# Patient Record
Sex: Male | Born: 1940 | Race: White | Hispanic: No | Marital: Married | Smoking: Former smoker
Health system: Southern US, Community
[De-identification: ages and names within clinical notes are randomized; demographics above are authoritative.]

## PROBLEM LIST (undated history)

## (undated) ENCOUNTER — Emergency Department (HOSPITAL_COMMUNITY): Payer: Medicare Other | Source: Home / Self Care

## (undated) DIAGNOSIS — I209 Angina pectoris, unspecified: Secondary | ICD-10-CM

## (undated) DIAGNOSIS — R0600 Dyspnea, unspecified: Secondary | ICD-10-CM

## (undated) DIAGNOSIS — G9349 Other encephalopathy: Secondary | ICD-10-CM

## (undated) DIAGNOSIS — R011 Cardiac murmur, unspecified: Secondary | ICD-10-CM

## (undated) DIAGNOSIS — R569 Unspecified convulsions: Secondary | ICD-10-CM

## (undated) DIAGNOSIS — I639 Cerebral infarction, unspecified: Secondary | ICD-10-CM

## (undated) DIAGNOSIS — I2510 Atherosclerotic heart disease of native coronary artery without angina pectoris: Secondary | ICD-10-CM

## (undated) DIAGNOSIS — R55 Syncope and collapse: Secondary | ICD-10-CM

## (undated) DIAGNOSIS — I350 Nonrheumatic aortic (valve) stenosis: Secondary | ICD-10-CM

## (undated) HISTORY — DX: Cardiac murmur, unspecified: R01.1

## (undated) HISTORY — DX: Syncope and collapse: R55

## (undated) HISTORY — PX: NO PAST SURGERIES: SHX2092

## (undated) HISTORY — DX: Unspecified convulsions: R56.9

---

## 2016-06-19 DIAGNOSIS — Z23 Encounter for immunization: Secondary | ICD-10-CM | POA: Diagnosis not present

## 2017-03-13 ENCOUNTER — Emergency Department (HOSPITAL_COMMUNITY): Payer: Medicare Other

## 2017-03-13 ENCOUNTER — Encounter (HOSPITAL_COMMUNITY): Payer: Self-pay | Admitting: Emergency Medicine

## 2017-03-13 ENCOUNTER — Inpatient Hospital Stay (HOSPITAL_COMMUNITY)
Admission: EM | Admit: 2017-03-13 | Discharge: 2017-03-15 | DRG: 101 | Disposition: A | Discharge status: Home Health | Payer: Medicare Other | Attending: Internal Medicine | Admitting: Internal Medicine

## 2017-03-13 DIAGNOSIS — R410 Disorientation, unspecified: Secondary | ICD-10-CM

## 2017-03-13 DIAGNOSIS — R4189 Other symptoms and signs involving cognitive functions and awareness: Secondary | ICD-10-CM

## 2017-03-13 DIAGNOSIS — R7303 Prediabetes: Secondary | ICD-10-CM | POA: Diagnosis not present

## 2017-03-13 DIAGNOSIS — R55 Syncope and collapse: Secondary | ICD-10-CM | POA: Diagnosis present

## 2017-03-13 DIAGNOSIS — Z823 Family history of stroke: Secondary | ICD-10-CM | POA: Diagnosis not present

## 2017-03-13 DIAGNOSIS — Z23 Encounter for immunization: Secondary | ICD-10-CM

## 2017-03-13 DIAGNOSIS — S0990XA Unspecified injury of head, initial encounter: Secondary | ICD-10-CM | POA: Diagnosis not present

## 2017-03-13 DIAGNOSIS — R569 Unspecified convulsions: Secondary | ICD-10-CM | POA: Diagnosis not present

## 2017-03-13 DIAGNOSIS — W19XXXA Unspecified fall, initial encounter: Secondary | ICD-10-CM

## 2017-03-13 DIAGNOSIS — R4020 Unspecified coma: Secondary | ICD-10-CM | POA: Diagnosis not present

## 2017-03-13 DIAGNOSIS — S50312A Abrasion of left elbow, initial encounter: Secondary | ICD-10-CM | POA: Diagnosis not present

## 2017-03-13 DIAGNOSIS — S50311A Abrasion of right elbow, initial encounter: Secondary | ICD-10-CM | POA: Diagnosis not present

## 2017-03-13 DIAGNOSIS — R4182 Altered mental status, unspecified: Secondary | ICD-10-CM

## 2017-03-13 DIAGNOSIS — R402431 Glasgow coma scale score 3-8, in the field [EMT or ambulance]: Secondary | ICD-10-CM | POA: Diagnosis not present

## 2017-03-13 DIAGNOSIS — S199XXA Unspecified injury of neck, initial encounter: Secondary | ICD-10-CM | POA: Diagnosis not present

## 2017-03-13 DIAGNOSIS — F17210 Nicotine dependence, cigarettes, uncomplicated: Secondary | ICD-10-CM | POA: Diagnosis present

## 2017-03-13 LAB — DIFFERENTIAL
BASOS PCT: 0 %
Basophils Absolute: 0 10*3/uL (ref 0.0–0.1)
EOS ABS: 0.3 10*3/uL (ref 0.0–0.7)
EOS PCT: 2 %
Lymphocytes Relative: 10 %
Lymphs Abs: 1.3 10*3/uL (ref 0.7–4.0)
MONO ABS: 0.9 10*3/uL (ref 0.1–1.0)
Monocytes Relative: 7 %
NEUTROS PCT: 81 %
Neutro Abs: 9.5 10*3/uL — ABNORMAL HIGH (ref 1.7–7.7)

## 2017-03-13 LAB — I-STAT TROPONIN, ED: TROPONIN I, POC: 0.07 ng/mL (ref 0.00–0.08)

## 2017-03-13 LAB — I-STAT CHEM 8, ED
BUN: 22 mg/dL — ABNORMAL HIGH (ref 6–20)
CALCIUM ION: 1.09 mmol/L — AB (ref 1.15–1.40)
Chloride: 102 mmol/L (ref 101–111)
Creatinine, Ser: 1.2 mg/dL (ref 0.61–1.24)
GLUCOSE: 186 mg/dL — AB (ref 65–99)
HCT: 40 % (ref 39.0–52.0)
HEMOGLOBIN: 13.6 g/dL (ref 13.0–17.0)
Potassium: 4.3 mmol/L (ref 3.5–5.1)
SODIUM: 135 mmol/L (ref 135–145)
TCO2: 23 mmol/L (ref 0–100)

## 2017-03-13 LAB — CBG MONITORING, ED: GLUCOSE-CAPILLARY: 185 mg/dL — AB (ref 65–99)

## 2017-03-13 LAB — CBC
HCT: 39.5 % (ref 39.0–52.0)
Hemoglobin: 12.9 g/dL — ABNORMAL LOW (ref 13.0–17.0)
MCH: 26.8 pg (ref 26.0–34.0)
MCHC: 32.7 g/dL (ref 30.0–36.0)
MCV: 82.1 fL (ref 78.0–100.0)
PLATELETS: 216 10*3/uL (ref 150–400)
RBC: 4.81 MIL/uL (ref 4.22–5.81)
RDW: 13.8 % (ref 11.5–15.5)
WBC: 12 10*3/uL — ABNORMAL HIGH (ref 4.0–10.5)

## 2017-03-13 LAB — COMPREHENSIVE METABOLIC PANEL
ALT: 17 U/L (ref 17–63)
AST: 24 U/L (ref 15–41)
Albumin: 4 g/dL (ref 3.5–5.0)
Alkaline Phosphatase: 66 U/L (ref 38–126)
Anion gap: 11 (ref 5–15)
BILIRUBIN TOTAL: 0.6 mg/dL (ref 0.3–1.2)
BUN: 20 mg/dL (ref 6–20)
CO2: 20 mmol/L — ABNORMAL LOW (ref 22–32)
CREATININE: 1.39 mg/dL — AB (ref 0.61–1.24)
Calcium: 9.4 mg/dL (ref 8.9–10.3)
Chloride: 102 mmol/L (ref 101–111)
GFR calc Af Amer: 55 mL/min — ABNORMAL LOW (ref >60)
GFR, EST NON AFRICAN AMERICAN: 48 mL/min — AB (ref >60)
GLUCOSE: 186 mg/dL — AB (ref 65–99)
Potassium: 4.2 mmol/L (ref 3.5–5.1)
SODIUM: 133 mmol/L — AB (ref 135–145)
TOTAL PROTEIN: 6.7 g/dL (ref 6.5–8.1)

## 2017-03-13 LAB — APTT: aPTT: 39 seconds — ABNORMAL HIGH (ref 24–36)

## 2017-03-13 LAB — PROTIME-INR
INR: 0.99
PROTHROMBIN TIME: 13.1 s (ref 11.4–15.2)

## 2017-03-13 MED ORDER — ASPIRIN 325 MG PO TABS
ORAL_TABLET | ORAL | Status: AC
  Filled 2017-03-13: qty 1

## 2017-03-13 MED ORDER — SODIUM CHLORIDE 0.9 % IV SOLN
1000.0000 mg | INTRAVENOUS | Status: AC
  Administered 2017-03-13: 1000 mg via INTRAVENOUS
  Filled 2017-03-13: qty 10

## 2017-03-13 MED ORDER — ONDANSETRON HCL 4 MG/2ML IJ SOLN
4.0000 mg | Freq: Once | INTRAMUSCULAR | Status: DC
  Filled 2017-03-13 (×2): qty 2

## 2017-03-13 MED ORDER — ASPIRIN 81 MG PO CHEW
CHEWABLE_TABLET | ORAL | Status: AC
  Filled 2017-03-13: qty 4

## 2017-03-13 MED ORDER — ACETAMINOPHEN 325 MG PO TABS
650.0000 mg | ORAL_TABLET | ORAL | Status: DC | PRN
  Administered 2017-03-14 (×2): 650 mg via ORAL
  Filled 2017-03-13 (×2): qty 2

## 2017-03-13 MED ORDER — ACETAMINOPHEN 650 MG RE SUPP: 650.0000 mg | RECTAL | Status: DC | PRN

## 2017-03-13 MED ORDER — TETANUS-DIPHTH-ACELL PERTUSSIS 5-2.5-18.5 LF-MCG/0.5 IM SUSP
0.5000 mL | Freq: Once | INTRAMUSCULAR | Status: AC
  Administered 2017-03-13: 0.5 mL via INTRAMUSCULAR
  Filled 2017-03-13: qty 0.5

## 2017-03-13 MED ORDER — ACETAMINOPHEN 160 MG/5ML PO SOLN: 650.0000 mg | ORAL | Status: DC | PRN

## 2017-03-13 MED ORDER — STROKE: EARLY STAGES OF RECOVERY BOOK
Freq: Once | Status: DC
  Filled 2017-03-13 (×2): qty 1

## 2017-03-13 MED ORDER — ONDANSETRON HCL 4 MG/2ML IJ SOLN
4.0000 mg | Freq: Once | INTRAMUSCULAR | Status: AC
  Administered 2017-03-13: 4 mg via INTRAVENOUS
  Filled 2017-03-13: qty 2

## 2017-03-13 MED ORDER — SENNOSIDES-DOCUSATE SODIUM 8.6-50 MG PO TABS: 1.0000 | ORAL_TABLET | Freq: Every evening | ORAL | Status: DC | PRN

## 2017-03-13 MED ORDER — ENOXAPARIN SODIUM 40 MG/0.4ML ~~LOC~~ SOLN
40.0000 mg | SUBCUTANEOUS | Status: DC
  Administered 2017-03-14 (×2): 40 mg via SUBCUTANEOUS
  Filled 2017-03-13 (×2): qty 0.4

## 2017-03-13 MED ORDER — ASPIRIN 325 MG PO TABS
325.0000 mg | ORAL_TABLET | Freq: Once | ORAL | Status: AC
  Administered 2017-03-13: 325 mg via ORAL

## 2017-03-13 MED ORDER — SODIUM CHLORIDE 0.9 % IV SOLN
500.0000 mg | Freq: Two times a day (BID) | INTRAVENOUS | Status: DC
  Administered 2017-03-14 – 2017-03-15 (×3): 500 mg via INTRAVENOUS
  Filled 2017-03-13 (×4): qty 5

## 2017-03-13 NOTE — ED Notes (Signed)
Spoke with EDP order to take c-collar off.

## 2017-03-13 NOTE — Progress Notes (Signed)
Pt came from ED very confused and refusing to do MRI.  RN brought pt's son over and pt still does not want to do test and son agreed with me that pt needs anesthesia due to mental status at this time.Danny KitchenMarland Krause

## 2017-03-13 NOTE — ED Triage Notes (Signed)
Arrived via EMS from home EMS reported LKW time 1400 when wife saw patient go outside to cut trees.  Checked on patient in driveway abrasion on bilateral elbows. EMS reported unresponsive first assessment with right sided gaze.  C-collar applied. CBG 208 IV Left AC 18G. Upon arrival patient alert confused able to follow commands intermittently and continue to look to the right.  ED Doctor ordered to call a code stroke.

## 2017-03-13 NOTE — ED Notes (Signed)
Pt family and pt requesting pain medication for headache. EDP aware states that we are awaiting admitting provider Orders.

## 2017-03-13 NOTE — ED Notes (Signed)
Placed condom cath on pt to obtain urine, tolerated well. Pt and family made aware.

## 2017-03-13 NOTE — ED Provider Notes (Signed)
[facility] DEPT Provider Note   CSN: [id number] Arrival date & time: 03/13/17  1526     History   Chief Complaint Chief Complaint  Patient presents with  . Altered Mental Status  . Code Stroke    HPI Danny Krause is a 76 y.o. male.  Level V caveat: AMS  Danny Krause is a 76 y.o. Male with unknown medical history presents to the emergency department via EMS with altered mental status. Per EMS his last known normal time was 2 PM when his wife saw patient outside to cut some trees. Later, the wife went outside to check on her husband and found him on the driveway. Abrasions noted to his elbows.  EMS reports that on their arrival he was unresponsive with right-sided gaze. CBG is 208. Patient unable to cooperate with history.    The history is provided by the EMS personnel and medical records. No language interpreter was used.  Altered Mental Status      History reviewed. No pertinent past medical history.  Patient Active Problem List   Diagnosis Date Noted  . Seizure ([place]) 03/13/2017    History reviewed. No pertinent surgical history.     Home Medications    Prior to Admission medications   Not on File    Family History Family History  Problem Relation Age of Onset  . Hernia Father   . Stroke Brother     Social History Social History  Substance Use Topics  . Smoking status: Current Every Day Smoker    Packs/day: 2.00  . Smokeless tobacco: Never Used  . Alcohol use No     Allergies   Patient has no known allergies.   Review of Systems Review of Systems  Unable to perform ROS: Mental status change     Physical Exam Updated Vital Signs BP (!) 103/57 (BP Location: Right Arm)   Pulse 65   Temp 98.3 F (36.8 C) (Oral)   Resp 20   SpO2 97%   Physical Exam  Constitutional: He appears well-developed and well-nourished. No distress.  HENT:  Head: Normocephalic and atraumatic.  Right Ear: External ear normal.  Left Ear: External ear  normal.  Mouth/Throat: Oropharynx is clear and moist.  No visible or palpated signs of head injury or trauma.   Eyes: Pupils are equal, round, and reactive to light. Right eye exhibits no discharge. Left eye exhibits no discharge.  Dirt noted around eyes. With EOMs will not cross midline to look to right on initial exam.   Neck:  Wearing C-collar.   Cardiovascular: Regular rhythm, normal heart sounds and intact distal pulses.   HR 104  Pulmonary/Chest: Effort normal and breath sounds normal. No respiratory distress.  Abdominal: Soft. There is no tenderness. There is no guarding.  Musculoskeletal: Normal range of motion. He exhibits no tenderness or deformity.  Abrasions noted to his bilateral elbows. Patient's bilateral clavicles, shoulders, wrists, hands, hips and knees are supple and non-tender to palpation.   Neurological: He is alert.  Alert. Able to follow some commands. Will not cross midline gaze to right side. Poor effort with grip strength bilaterally. Will move toes bilaterally but will not move arms and legs to command. Repeats "help me" during exam and has no other vocal response. Will open eyes to command.   Skin: Skin is warm and dry. Capillary refill takes less than 2 seconds. No rash noted. He is not diaphoretic. No erythema. No pallor.  Abrasions noted to bilateral elbows.   Nursing note  and vitals reviewed.    ED Treatments / Results  Labs (all labs ordered are listed, but only abnormal results are displayed) Labs Reviewed  APTT - Abnormal; Notable for the following:       Result Value   aPTT 39 (*)    All other components within normal limits  CBC - Abnormal; Notable for the following:    WBC 12.0 (*)    Hemoglobin 12.9 (*)    All other components within normal limits  DIFFERENTIAL - Abnormal; Notable for the following:    Neutro Abs 9.5 (*)    All other components within normal limits  COMPREHENSIVE METABOLIC PANEL - Abnormal; Notable for the following:     Sodium 133 (*)    CO2 20 (*)    Glucose, Bld 186 (*)    Creatinine, Ser 1.39 (*)    GFR calc non Af Amer 48 (*)    GFR calc Af Amer 55 (*)    All other components within normal limits  ACETAMINOPHEN LEVEL - Abnormal; Notable for the following:    Acetaminophen (Tylenol), Serum <10 (*)    All other components within normal limits  LIPID PANEL - Abnormal; Notable for the following:    LDL Cholesterol 134 (*)    All other components within normal limits  I-STAT CHEM 8, ED - Abnormal; Notable for the following:    BUN 22 (*)    Glucose, Bld 186 (*)    Calcium, Ion 1.09 (*)    All other components within normal limits  CBG MONITORING, ED - Abnormal; Notable for the following:    Glucose-Capillary 185 (*)    All other components within normal limits  ETHANOL  PROTIME-INR  SALICYLATE LEVEL  RAPID URINE DRUG SCREEN, HOSP PERFORMED  URINALYSIS, ROUTINE W REFLEX MICROSCOPIC  HEMOGLOBIN A1C  TSH  VITAMIN B12  I-STAT TROPOININ, ED    EKG  EKG Interpretation  Date/Time:  Tuesday March 13 2017 15:50:33 EDT Ventricular Rate:  107 PR Interval:    QRS Duration: 118 QT Interval:  347 QTC Calculation: 463 R Axis:   155 Text Interpretation:  Sinus tachycardia IRBBB and LPFB No old tracing to compare Confirmed by Delora Fuel ([id number]) on 03/13/2017 4:00:21 PM       Radiology Ct Cervical Spine Wo Contrast  Result Date: 03/13/2017 CLINICAL DATA:  Jerelyn Scott on the ground. EXAM: CT HEAD WITHOUT CONTRAST CT CERVICAL SPINE WITHOUT CONTRAST TECHNIQUE: Multidetector CT imaging of the head and cervical spine was performed following the standard protocol without intravenous contrast. Multiplanar CT image reconstructions of the cervical spine were also generated. COMPARISON:  None. FINDINGS: CT HEAD FINDINGS Brain: No sign of acute infarction, mass lesion, hemorrhage, hydrocephalus or extra-axial collection. Few old small vessel changes of the left basal ganglia. Vascular: There is atherosclerotic  calcification of the major vessels at the base of the brain. Skull: Negative Sinuses/Orbits: Clear/ normal Other: None CT CERVICAL SPINE FINDINGS Alignment: Some straightening of the normal cervical lordosis. No traumatic malalignment. Skull base and vertebrae: No fracture or focal lesion. Soft tissues and spinal canal: Negative Disc levels: Degenerative spondylosis from C2-3 through C6-7. 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 Upper chest: Advanced emphysema. Other: None significant IMPRESSION: Head CT: No acute finding. Mild chronic small-vessel change of the left basal ganglia. Aspects 10. Cervical spine CT: No acute or traumatic finding. Chronic spondylosis. Emphysema. These results were discussed in person at the time  of interpretation on 03/13/2017 at 3:48 pm to Dr. Rory Percy , who verbally acknowledged these results. Electronically Signed   By: Nelson Chimes M.D.   On: 03/13/2017 15:55   Dg Chest Port 1 View  Result Date: 03/13/2017 CLINICAL DATA:  Altered mental status EXAM: PORTABLE CHEST 1 VIEW COMPARISON:  None. FINDINGS: Borderline cardiomegaly. Grossly negative mediastinal contours when accounting for rightward rotation. Nonspecific interstitial coarsening. No Kerley lines, effusion, or pneumothorax. IMPRESSION: 1. No definite acute disease. 2. Interstitial coarsening which could be bronchitic, atelectatic, or from pneumonitis. 3. Borderline cardiomegaly. Electronically Signed   By: Monte Fantasia M.D.   On: 03/13/2017 16:49   Ct Head Code Stroke W/o Cm  Result Date: 03/13/2017 CLINICAL DATA:  Jerelyn Scott on the ground. EXAM: CT HEAD WITHOUT CONTRAST CT CERVICAL SPINE WITHOUT CONTRAST TECHNIQUE: Multidetector CT imaging of the head and cervical spine was performed following the standard protocol without intravenous contrast. Multiplanar CT image reconstructions of the cervical spine were also generated. COMPARISON:  None. FINDINGS: CT HEAD  FINDINGS Brain: No sign of acute infarction, mass lesion, hemorrhage, hydrocephalus or extra-axial collection. Few old small vessel changes of the left basal ganglia. Vascular: There is atherosclerotic calcification of the major vessels at the base of the brain. Skull: Negative Sinuses/Orbits: Clear/ normal Other: None CT CERVICAL SPINE FINDINGS Alignment: Some straightening of the normal cervical lordosis. No traumatic malalignment. Skull base and vertebrae: No fracture or focal lesion. Soft tissues and spinal canal: Negative Disc levels: Degenerative spondylosis from C2-3 through C6-7. 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 Upper chest: Advanced emphysema. Other: None significant IMPRESSION: Head CT: No acute finding. Mild chronic small-vessel change of the left basal ganglia. Aspects 10. Cervical spine CT: No acute or traumatic finding. Chronic spondylosis. Emphysema. These results were discussed in person at the time of interpretation on 03/13/2017 at 3:48 pm to Dr. Rory Percy , who verbally acknowledged these results. Electronically Signed   By: Nelson Chimes M.D.   On: 03/13/2017 15:55    Procedures Procedures (including critical care time)  CRITICAL CARE Performed by: Hanley Hays   Total critical care time: 45 minutes  Critical care time was exclusive of separately billable procedures and treating other patients.  Critical care was necessary to treat or prevent imminent or life-threatening deterioration.  Critical care was time spent personally by me on the following activities: development of treatment plan with patient and/or surrogate as well as nursing, discussions with consultants, evaluation of patient's response to treatment, examination of patient, obtaining history from patient or surrogate, ordering and performing treatments and interventions, ordering and review of laboratory studies, ordering and review of  radiographic studies, pulse oximetry and re-evaluation of patient's condition.   Medications Ordered in ED Medications  levETIRAcetam (KEPPRA) 500 mg in sodium chloride 0.9 % 100 mL IVPB (not administered)  ondansetron (ZOFRAN) injection 4 mg (0 mg Intravenous Hold 03/13/17 2053)   stroke: mapping our early stages of recovery book (not administered)  acetaminophen (TYLENOL) tablet 650 mg (not administered)    Or  acetaminophen (TYLENOL) solution 650 mg (not administered)    Or  acetaminophen (TYLENOL) suppository 650 mg (not administered)  senna-docusate (Senokot-S) tablet 1 tablet (not administered)  enoxaparin (LOVENOX) injection 40 mg (40 mg Subcutaneous Given 03/14/17 0021)  Tdap (BOOSTRIX) injection 0.5 mL (0.5 mLs Intramuscular Given 03/13/17 1614)  levETIRAcetam (KEPPRA) 1,000 mg in sodium chloride 0.9 % 100 mL IVPB (0 mg Intravenous Stopped  03/13/17 1650)  ondansetron (ZOFRAN) injection 4 mg (4 mg Intravenous Given 03/13/17 1609)  aspirin tablet 325 mg (325 mg Oral Given 03/13/17 2124)     Initial Impression / Assessment and Plan / ED Course  I have reviewed the triage vital signs and the nursing notes.  Pertinent labs & imaging results that were available during my care of the patient were reviewed by me and considered in my medical decision making (see chart for details).    This is a 76 y.o. Male with unknown medical history presents to the emergency department via EMS with altered mental status. Per EMS his last known normal time was 2 PM when his wife saw patient outside to cut some trees. Later, the wife went outside to check on her husband and found him on the driveway. Abrasions noted to his elbows.  EMS reports that on their arrival he was unresponsive with right-sided gaze. CBG is 208. Patient unable to cooperate with history.  On patient arrival via EMS myself and Dr. Roxanne Mins are at bedside. He Is alert and able to follow some commands. He will open his eyes to verbal stimuli.  He was also his toes. On EOMs he does not cross the midline to look to the right. He repeats help me during exam, but says no other verbal response. Code stroke was activated.   Neurology down the patient's bedside. CT scan of head and C-spine shows no acute finding. Neurology suspects possible stroke versus seizure. Plan is to obtain MRI.  Blood work here is unremarkable. Chest x-ray shows emphysema and borderline cardiomegaly.  At reevaluation patient has become more responsive. He is awake and alert. He can tell me nothing about the events leading up to his ER visit. He tells me that he is in the hospital. He denies any pain or complaints on my reevaluation. Patient and family agree with plan for admission.  I consulted with tried hospitalist service who accepted the patient for admission.   This patient was discussed with and evaluated by Dr. Roxanne Mins who agrees with assessment and plan.   Final Clinical Impressions(s) / ED Diagnoses   Final diagnoses:  Altered mental status, unspecified altered mental status type  Unresponsive    New Prescriptions There are no discharge medications for this patient.    Waynetta Pean, PA-C 27/78/24 [id number]    Delora Fuel, MD 61/44/31 [phone]

## 2017-03-13 NOTE — ED Notes (Signed)
Son arrived at bedside speaking with Neurologist and stroke team.

## 2017-03-13 NOTE — ED Notes (Signed)
MRI called states patient will still not cooperate with having the MRI for 30-40 minutes with the sons help. States will probably need anesthesia.  Provider noted states to hold MRI at this time and will talk to the Hospitalist.

## 2017-03-13 NOTE — ED Notes (Signed)
Neurology at bedside speaking to 3 family members at bedside.

## 2017-03-13 NOTE — ED Notes (Signed)
EEG completed.

## 2017-03-13 NOTE — Consult Note (Signed)
Requesting Physician: Dr. Roxanne Mins    Chief Complaint: Confusion code stroke  History obtained from:  EMS and son  HPI:                                                                                                                                         Danny Krause is an 76 y.o. male who was out cutting trees and last seen normal at 1400 hours. HE was found down later with "gaze preference (right) per EMS.) on arrival he had multiple bruises, C-collar, confused but able to follow simple commands intermittently. Moving all extremities. No history of seizure or stroke. And takes no medications. At this point question of concussion versus seizure. CT head showed no acute pathology or bleed.   Date last known well: Date: 03/13/2017 Time last known well: Time: 14:00 tPA Given: No: no focal symptoms   History reviewed. No pertinent past medical history.  History reviewed. No pertinent surgical history.  No family history on file. Social History:  has no tobacco, alcohol, and drug history on file.  Allergies: No Known Allergies  Medications:                                                                                                                           None per son  ROS:                                                                                                                                       History obtained from unobtainable from patient due to confusion  General ROS: negative for - chills, fatigue, fever, night sweats, weight gain or weight loss Psychological ROS: negative for - behavioral disorder, hallucinations, memory difficulties, mood swings or suicidal ideation Ophthalmic ROS: negative for - blurry  vision, double vision, eye pain or loss of vision ENT ROS: negative for - epistaxis, nasal discharge, oral lesions, sore throat, tinnitus or vertigo Allergy and Immunology ROS: negative for - hives or itchy/watery eyes Hematological and Lymphatic ROS: negative  for - bleeding problems, bruising or swollen lymph nodes Endocrine ROS: negative for - galactorrhea, hair pattern changes, polydipsia/polyuria or temperature intolerance Respiratory ROS: negative for - cough, hemoptysis, shortness of breath or wheezing Cardiovascular ROS: negative for - chest pain, dyspnea on exertion, edema or irregular heartbeat Gastrointestinal ROS: negative for - abdominal pain, diarrhea, hematemesis, nausea/vomiting or stool incontinence Genito-Urinary ROS: negative for - dysuria, hematuria, incontinence or urinary frequency/urgency Musculoskeletal ROS: negative for - joint swelling or muscular weakness Neurological ROS: as noted in HPI Dermatological ROS: negative for rash and skin lesion changes  Neurologic Examination:                                                                                                      Blood pressure 122/71, pulse (!) 107, temperature (!) 96.5 F (35.8 C), temperature source Temporal, resp. rate (!) 22, SpO2 92 %.  HEENT-  Normocephalic, no lesions, without obvious abnormality.  Normal external eye and conjunctiva.  Normal TM's bilaterally.  Normal auditory canals and external ears. Normal external nose, mucus membranes and septum.  Normal pharynx. Cardiovascular- S1, S2 normal, pulses palpable throughout   Lungs- chest clear, no wheezing, rales, normal symmetric air entry Abdomen- soft, non-tender; bowel sounds normal; no masses,  no organomegaly Extremities- no joint deformities, effusion, or inflammation Lymph-no adenopathy palpable Musculoskeletal-no joint tenderness, deformity or swelling Skin-warm and dry, no hyperpigmentation, vitiligo, or suspicious lesions  Neurological Examination Mental Status: Alert, confused, follows some commands, no aphasia noted.  Cranial Nerves: II:  Visual fields grossly normal,  III,IV, VI: ptosis not present, extra-ocular motions intact bilaterally, pupils equal, round, reactive to light and  accommodation V,VII: smile symmetric, facial light touch sensation normal bilaterally VIII: hearing normal bilaterally IX,X: uvula rises symmetrically XI: bilateral shoulder shrug XII: midline tongue extension Motor: Right : Upper extremity   5/5    Left:     Upper extremity   5/5  Lower extremity   5/5     Lower extremity   5/5 Tone and bulk:normal tone throughout; no atrophy noted Sensory: Pinprick and light touch intact throughout, bilaterally--with decreased PP to LE bilaterally Deep Tendon Reflexes: 2+ and symmetric throughout UE and no KJ or AJ Plantars: Right: downgoing   Left: downgoing Cerebellar: Due to confusion unable to obtain Gait: not tested       Lab Results: Basic Metabolic Panel:  Recent Labs Lab 03/13/17 1534  NA 135  K 4.3  CL 102  GLUCOSE 186*  BUN 22*  CREATININE 1.20    Liver Function Tests: No results for input(s): AST, ALT, ALKPHOS, BILITOT, PROT, ALBUMIN in the last 168 hours. No results for input(s): LIPASE, AMYLASE in the last 168 hours. No results for input(s): AMMONIA in the last 168 hours.  CBC:  Recent Labs Lab 03/13/17 1528 03/13/17 1534  WBC 12.0*  --  NEUTROABS 9.5*  --   HGB 12.9* 13.6  HCT 39.5 40.0  MCV 82.1  --   PLT 216  --     Cardiac Enzymes: No results for input(s): CKTOTAL, CKMB, CKMBINDEX, TROPONINI in the last 168 hours.  Lipid Panel: No results for input(s): CHOL, TRIG, HDL, CHOLHDL, VLDL, LDLCALC in the last 168 hours.  CBG:  Recent Labs Lab 03/13/17 [place]    Microbiology: No results found for this or any previous visit.  Coagulation Studies:  Recent Labs  03/13/17 1528  LABPROT 13.1  INR 0.99    Imaging: Ct Cervical Spine Wo Contrast  Result Date: 03/13/2017 CLINICAL DATA:  Jerelyn Scott on the ground. EXAM: CT HEAD WITHOUT CONTRAST CT CERVICAL SPINE WITHOUT CONTRAST TECHNIQUE: Multidetector CT imaging of the head and cervical spine was performed following the standard protocol  without intravenous contrast. Multiplanar CT image reconstructions of the cervical spine were also generated. COMPARISON:  None. FINDINGS: CT HEAD FINDINGS Brain: No sign of acute infarction, mass lesion, hemorrhage, hydrocephalus or extra-axial collection. Few old small vessel changes of the left basal ganglia. Vascular: There is atherosclerotic calcification of the major vessels at the base of the brain. Skull: Negative Sinuses/Orbits: Clear/ normal Other: None CT CERVICAL SPINE FINDINGS Alignment: Some straightening of the normal cervical lordosis. No traumatic malalignment. Skull base and vertebrae: No fracture or focal lesion. Soft tissues and spinal canal: Negative Disc levels: Degenerative spondylosis from C2-3 through C6-7. 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 Upper chest: Advanced emphysema. Other: None significant IMPRESSION: Head CT: No acute finding. Mild chronic small-vessel change of the left basal ganglia. Aspects 10. Cervical spine CT: No acute or traumatic finding. Chronic spondylosis. Emphysema. These results were discussed in person at the time of interpretation on 03/13/2017 at 3:48 pm to Dr. Rory Percy , who verbally acknowledged these results. Electronically Signed   By: Nelson Chimes M.D.   On: 03/13/2017 15:55   Ct Head Code Stroke W/o Cm  Result Date: 03/13/2017 CLINICAL DATA:  Jerelyn Scott on the ground. EXAM: CT HEAD WITHOUT CONTRAST CT CERVICAL SPINE WITHOUT CONTRAST TECHNIQUE: Multidetector CT imaging of the head and cervical spine was performed following the standard protocol without intravenous contrast. Multiplanar CT image reconstructions of the cervical spine were also generated. COMPARISON:  None. FINDINGS: CT HEAD FINDINGS Brain: No sign of acute infarction, mass lesion, hemorrhage, hydrocephalus or extra-axial collection. Few old small vessel changes of the left basal ganglia. Vascular: There is atherosclerotic calcification  of the major vessels at the base of the brain. Skull: Negative Sinuses/Orbits: Clear/ normal Other: None CT CERVICAL SPINE FINDINGS Alignment: Some straightening of the normal cervical lordosis. No traumatic malalignment. Skull base and vertebrae: No fracture or focal lesion. Soft tissues and spinal canal: Negative Disc levels: Degenerative spondylosis from C2-3 through C6-7. 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 Upper chest: Advanced emphysema. Other: None significant IMPRESSION: Head CT: No acute finding. Mild chronic small-vessel change of the left basal ganglia. Aspects 10. Cervical spine CT: No acute or traumatic finding. Chronic spondylosis. Emphysema. These results were discussed in person at the time of interpretation on 03/13/2017 at 3:48 pm to Dr. Rory Percy , who verbally acknowledged these results. Electronically Signed   By: Nelson Chimes M.D.   On: 03/13/2017 15:55     Assessment and plan discussed with with attending physician and  they are in agreement.    Etta Quill PA-C [facility] [phone]  03/13/2017, 3:57 PM   Assessment: 76 y.o. male with AMS LSN 1400. At this time question seizure with post-ictal right sided gaze. At this point cannot rule out stroke but not TPA candidate.   Stroke Risk Factors - smoking  [facility] addendum Patient seen and examined with Etta Quill, PA-C in the emergency room as code stroke. Please see detailed history, review of systems and examination as above.  Briefly, 76 year old man with no past medical history was not seen a doctor in at least the last 15-20 years presented to the emergency room for evaluation after having been found down and also had a right gaze preference according to EMS and emergency room providers. Patient is a heavy smoker but denies any illicit drug use or alcohol use.  On examination in the emergency room: The patient was in a c-collar He was  awake, alert, followed some commands intermittently, mimicked a few times. Speech was very dysarthric. He was also not able to name any objects presented to him. Pupils are equal round and reactive to light 3 mm brisk, extraocular movements exam initially showed a right gaze preference but upon shining a bright light on the left side he was able completely bury his left pupil and to the left canthus, no facial asymmetry, no difference in facial sensation on either side, auditory acuity grossly intact. Motor exam: Grossly 5/5 in all 4 extremities. Normal tone and bulk throughout with no atrophy Sensory exam: Decreased sensation to light touch in both lower extremities, symmetric distribution. DTRs: 2+ all over the upper extremity, unable to elicit knee jerk or ankle jerk bilaterally Unable to test gait or coordination due to patient cooperation.  Pertinent labs Glucose 186, BUN 22, creatinine 1.2. Hemoglobin 13.6 hematocrit 40.0  Imaging Noncontrast CT of the head does not show any evidence of acute bleed or evolving infarct  Assessment 76 year old man brought to the ER after being found down, with concerns about her mental status and stroke. According to report he had some right gaze preference but on examination by the neurology team, he had no restriction of extraocular movements and no gaze preference. His examination is consistent with encephalopathy, which could be toxic/metabolic versus postictal due to a seizure since he was found down and had a fall.  Impression Toxic metabolic encephalopathy - most likely Rule out seizures - likely Rule out stroke - less likely  Recommendations  At this time we would recommend getting an MRI of the brain without contrast to evaluate for any evidence of acute stroke.  If there is an acute stroke, MRA or CT of the head and neck should also be obtained as well as stroke risk factor workup be pursued at that time as well.  Stat EEG was  obtained-read pending at this time. Dr. Leonel Ramsay will follow.  Keppra 500 mg twice a day-IV until clear his swallow evaluation, could convert to by mouth after that.  Labs to include B12, TSH, urinalysis, chest x-ray, urine drug screen  Stroke risk factor modification to include smoking cessation  -- Amie Portland, MD [facility] [phone]  If 7pm to 7am, please call on call as listed on AMION.

## 2017-03-13 NOTE — ED Notes (Signed)
Intermittently patient looks scared and moves body suddenly able to calm with talking to family.

## 2017-03-13 NOTE — ED Notes (Signed)
Clothes, phone, watch given to son at bedside.

## 2017-03-13 NOTE — ED Notes (Signed)
EEG at bedside.

## 2017-03-13 NOTE — Code Documentation (Signed)
76yo male arriving to [facility] via [place] at 75.  Patient from home where he was found outside by his wife.  LKW reported as 1400.  Code stroke activated on patient arrival d/t right gaze.  Patient to CT.  Stroke team to CT.  CT completed.  NIHSS 14, see documentation for details and code stroke times.  Patient initially not following commands and repeating "Help me Reita Cliche" to questions, however, moving all extremities.  Dr. Rory Percy at the bedside.  No focal deficits appreciated.  No acute stroke treatment at this time.  Code stroke canceled.  Bedside handoff with ED RN Marya Amsler.

## 2017-03-13 NOTE — Progress Notes (Signed)
STAT EEG completed; results pending.

## 2017-03-13 NOTE — ED Notes (Signed)
Son talking with patient stated name correctly.

## 2017-03-13 NOTE — ED Notes (Signed)
Son talking with patient. Patient feels like he is going to vomit. Doctor notified.

## 2017-03-13 NOTE — H&P (Signed)
History and Physical    Danny Krause IEP:[id number] DOB: Mar 20, 1941 DOA: 03/13/2017  PCP: No primary care provider on file.  Patient coming from: Home  I have personally briefly reviewed patient's old medical records in [place]  Chief Complaint: AMS  HPI: Danny Krause is a 76 y.o. male with no significant medical history, hasnt been to doctor in 35 years.  Patient brought in to ED by EMS after his wife found him down after he was LKW at 2pm cutting trees.  Abrasions to elbows, EMS reports unresponsive initially with R sided gaze.   ED Course: Has slowly improved in ED, CT head neg, MRI brain wasn't able to be performed initially due to patient not being able to hold still.  Patient has slowly recovered.  EEG done in ED results pending.  Now more alert, oriented, and just passed swallow screen.  Keppra 1gm load and 500 BID ordered per neurology.   Review of Systems: As per HPI otherwise 10 point review of systems negative.   History reviewed. No pertinent past medical history.  History reviewed. No pertinent surgical history.   reports that he has been smoking.  He does not have any smokeless tobacco history on file. He reports that he does not drink alcohol or use drugs.  No Known Allergies  Family History  Problem Relation Age of Onset  . Hernia Father   . Stroke Brother      Prior to Admission medications   Not on File    Physical Exam: Vitals:   03/13/17 1945 03/13/17 2000 03/13/17 2015 03/13/17 2045  BP: 117/73 108/68 109/65 117/71  Pulse: 64 61 (!) 59 67  Resp: (!) 23 (!) 22 (!) 25   Temp:      TempSrc:      SpO2: 97% 96% 95% 98%    Constitutional: NAD, calm, comfortable Eyes: PERRL, lids and conjunctivae normal ENMT: Mucous membranes are moist. Posterior pharynx clear of any exudate or lesions.Normal dentition.  Neck: normal, supple, no masses, no thyromegaly Respiratory: clear to auscultation bilaterally, no wheezing, no crackles. Normal  respiratory effort. No accessory muscle use.  Cardiovascular: Regular rate and rhythm, no murmurs / rubs / gallops. No extremity edema. 2+ pedal pulses. No carotid bruits.  Abdomen: no tenderness, no masses palpated. No hepatosplenomegaly. Bowel sounds positive.  Musculoskeletal: no clubbing / cyanosis. No joint deformity upper and lower extremities. Good ROM, no contractures. Normal muscle tone.  Skin: no rashes, lesions, ulcers. No induration Neurologic: CN 2-12 grossly intact. Sensation intact, DTR normal. Strength 5/5 in all 4.  Psychiatric: Normal judgment and insight. Alert and oriented x 3. Normal mood.    Labs on Admission: I have personally reviewed following labs and imaging studies  CBC:  Recent Labs Lab 03/13/17 1528 03/13/17 1534  WBC 12.0*  --   NEUTROABS 9.5*  --   HGB 12.9* 13.6  HCT 39.5 40.0  MCV 82.1  --   PLT 216  --    Basic Metabolic Panel:  Recent Labs Lab 03/13/17 1528 03/13/17 1534  NA 133* 135  K 4.2 4.3  CL 102 102  CO2 20*  --   GLUCOSE 186* 186*  BUN 20 22*  CREATININE 1.39* 1.20  CALCIUM 9.4  --    GFR: CrCl cannot be calculated (Unknown ideal weight.). Liver Function Tests:  Recent Labs Lab 03/13/17 1528  AST 24  ALT 17  ALKPHOS 66  BILITOT 0.6  PROT 6.7  ALBUMIN 4.0   No results  for input(s): LIPASE, AMYLASE in the last 168 hours. No results for input(s): AMMONIA in the last 168 hours. Coagulation Profile:  Recent Labs Lab 03/13/17 1528  INR 0.99   Cardiac Enzymes: No results for input(s): CKTOTAL, CKMB, CKMBINDEX, TROPONINI in the last 168 hours. BNP (last 3 results) No results for input(s): PROBNP in the last 8760 hours. HbA1C: No results for input(s): HGBA1C in the last 72 hours. CBG:  Recent Labs Lab 03/13/17 1528  GLUCAP 185*   Lipid Profile: No results for input(s): CHOL, HDL, LDLCALC, TRIG, CHOLHDL, LDLDIRECT in the last 72 hours. Thyroid Function Tests: No results for input(s): TSH, T4TOTAL, FREET4,  T3FREE, THYROIDAB in the last 72 hours. Anemia Panel: No results for input(s): VITAMINB12, FOLATE, FERRITIN, TIBC, IRON, RETICCTPCT in the last 72 hours. Urine analysis: No results found for: COLORURINE, APPEARANCEUR, [place], [place], GLUCOSEU, [place], BILIRUBINUR, KETONESUR, PROTEINUR, UROBILINOGEN, NITRITE, LEUKOCYTESUR  Radiological Exams on Admission: Ct Cervical Spine Wo Contrast  Result Date: 03/13/2017 CLINICAL DATA:  Jerelyn Scott on the ground. EXAM: CT HEAD WITHOUT CONTRAST CT CERVICAL SPINE WITHOUT CONTRAST TECHNIQUE: Multidetector CT imaging of the head and cervical spine was performed following the standard protocol without intravenous contrast. Multiplanar CT image reconstructions of the cervical spine were also generated. COMPARISON:  None. FINDINGS: CT HEAD FINDINGS Brain: No sign of acute infarction, mass lesion, hemorrhage, hydrocephalus or extra-axial collection. Few old small vessel changes of the left basal ganglia. Vascular: There is atherosclerotic calcification of the major vessels at the base of the brain. Skull: Negative Sinuses/Orbits: Clear/ normal Other: None CT CERVICAL SPINE FINDINGS Alignment: Some straightening of the normal cervical lordosis. No traumatic malalignment. Skull base and vertebrae: No fracture or focal lesion. Soft tissues and spinal canal: Negative Disc levels: Degenerative spondylosis from C2-3 through C6-7. 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 Upper chest: Advanced emphysema. Other: None significant IMPRESSION: Head CT: No acute finding. Mild chronic small-vessel change of the left basal ganglia. Aspects 10. Cervical spine CT: No acute or traumatic finding. Chronic spondylosis. Emphysema. These results were discussed in person at the time of interpretation on 03/13/2017 at 3:48 pm to Dr. Rory Percy , who verbally acknowledged these results. Electronically Signed   By: Nelson Chimes M.D.   On: 03/13/2017 15:55    Dg Chest Port 1 View  Result Date: 03/13/2017 CLINICAL DATA:  Altered mental status EXAM: PORTABLE CHEST 1 VIEW COMPARISON:  None. FINDINGS: Borderline cardiomegaly. Grossly negative mediastinal contours when accounting for rightward rotation. Nonspecific interstitial coarsening. No Kerley lines, effusion, or pneumothorax. IMPRESSION: 1. No definite acute disease. 2. Interstitial coarsening which could be bronchitic, atelectatic, or from pneumonitis. 3. Borderline cardiomegaly. Electronically Signed   By: Monte Fantasia M.D.   On: 03/13/2017 16:49   Ct Head Code Stroke W/o Cm  Result Date: 03/13/2017 CLINICAL DATA:  Jerelyn Scott on the ground. EXAM: CT HEAD WITHOUT CONTRAST CT CERVICAL SPINE WITHOUT CONTRAST TECHNIQUE: Multidetector CT imaging of the head and cervical spine was performed following the standard protocol without intravenous contrast. Multiplanar CT image reconstructions of the cervical spine were also generated. COMPARISON:  None. FINDINGS: CT HEAD FINDINGS Brain: No sign of acute infarction, mass lesion, hemorrhage, hydrocephalus or extra-axial collection. Few old small vessel changes of the left basal ganglia. Vascular: There is atherosclerotic calcification of the major vessels at the base of the brain. Skull: Negative Sinuses/Orbits: Clear/ normal Other: None CT CERVICAL SPINE FINDINGS Alignment: Some straightening of the normal  cervical lordosis. No traumatic malalignment. Skull base and vertebrae: No fracture or focal lesion. Soft tissues and spinal canal: Negative Disc levels: Degenerative spondylosis from C2-3 through C6-7. 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 Upper chest: Advanced emphysema. Other: None significant IMPRESSION: Head CT: No acute finding. Mild chronic small-vessel change of the left basal ganglia. Aspects 10. Cervical spine CT: No acute or traumatic finding. Chronic spondylosis. Emphysema. These results were  discussed in person at the time of interpretation on 03/13/2017 at 3:48 pm to Dr. Rory Percy , who verbally acknowledged these results. Electronically Signed   By: Nelson Chimes M.D.   On: 03/13/2017 15:55    EKG: Independently reviewed.  Assessment/Plan Active Problems:   Seizure ([employer])    1. Seizure vs TME vs stroke - 1. Mental status improved 2. Got keppra 1gm load and 500 BID in ED 3. EEG done in ED 4. MRI brain ordered 5. Getting ASA 325 for possible stroke although this less likely per neurology (also for his headache). 6. Tylenol PRN  DVT prophylaxis: Lovenox Code Status: Full Family Communication: Family at bedside Disposition Plan: Home after admit Consults called: Neurology Admission status: Place in [place], [place] Hospitalists Pager [phone]  If 7AM-7PM, please contact day team taking care of patient [email or web address] Password [email or web address]  03/13/2017, 9:13 PM

## 2017-03-13 NOTE — ED Notes (Signed)
MRI called stated patient not following directions. Stated will bring son to MRI because patient listens to son.

## 2017-03-14 ENCOUNTER — Encounter (HOSPITAL_COMMUNITY): Payer: Self-pay | Admitting: *Deleted

## 2017-03-14 ENCOUNTER — Observation Stay (HOSPITAL_COMMUNITY): Payer: Medicare Other

## 2017-03-14 DIAGNOSIS — S50312A Abrasion of left elbow, initial encounter: Secondary | ICD-10-CM | POA: Diagnosis present

## 2017-03-14 DIAGNOSIS — M25521 Pain in right elbow: Secondary | ICD-10-CM | POA: Diagnosis not present

## 2017-03-14 DIAGNOSIS — R400 Somnolence: Secondary | ICD-10-CM | POA: Diagnosis not present

## 2017-03-14 DIAGNOSIS — S59901A Unspecified injury of right elbow, initial encounter: Secondary | ICD-10-CM | POA: Diagnosis not present

## 2017-03-14 DIAGNOSIS — S0990XA Unspecified injury of head, initial encounter: Secondary | ICD-10-CM | POA: Diagnosis not present

## 2017-03-14 DIAGNOSIS — M25522 Pain in left elbow: Secondary | ICD-10-CM | POA: Diagnosis not present

## 2017-03-14 DIAGNOSIS — R410 Disorientation, unspecified: Secondary | ICD-10-CM | POA: Diagnosis not present

## 2017-03-14 DIAGNOSIS — R55 Syncope and collapse: Secondary | ICD-10-CM | POA: Diagnosis present

## 2017-03-14 DIAGNOSIS — R4020 Unspecified coma: Secondary | ICD-10-CM | POA: Diagnosis not present

## 2017-03-14 DIAGNOSIS — Z23 Encounter for immunization: Secondary | ICD-10-CM | POA: Diagnosis not present

## 2017-03-14 DIAGNOSIS — Z823 Family history of stroke: Secondary | ICD-10-CM | POA: Diagnosis not present

## 2017-03-14 DIAGNOSIS — R569 Unspecified convulsions: Secondary | ICD-10-CM | POA: Diagnosis present

## 2017-03-14 DIAGNOSIS — G9340 Encephalopathy, unspecified: Secondary | ICD-10-CM | POA: Diagnosis not present

## 2017-03-14 DIAGNOSIS — S3992XA Unspecified injury of lower back, initial encounter: Secondary | ICD-10-CM | POA: Diagnosis not present

## 2017-03-14 DIAGNOSIS — R4182 Altered mental status, unspecified: Secondary | ICD-10-CM | POA: Diagnosis not present

## 2017-03-14 DIAGNOSIS — S50311A Abrasion of right elbow, initial encounter: Secondary | ICD-10-CM | POA: Diagnosis present

## 2017-03-14 DIAGNOSIS — S59902A Unspecified injury of left elbow, initial encounter: Secondary | ICD-10-CM | POA: Diagnosis not present

## 2017-03-14 DIAGNOSIS — F17210 Nicotine dependence, cigarettes, uncomplicated: Secondary | ICD-10-CM | POA: Diagnosis present

## 2017-03-14 DIAGNOSIS — R4189 Other symptoms and signs involving cognitive functions and awareness: Secondary | ICD-10-CM | POA: Diagnosis not present

## 2017-03-14 DIAGNOSIS — W19XXXA Unspecified fall, initial encounter: Secondary | ICD-10-CM | POA: Diagnosis not present

## 2017-03-14 DIAGNOSIS — R7303 Prediabetes: Secondary | ICD-10-CM | POA: Diagnosis present

## 2017-03-14 DIAGNOSIS — M545 Low back pain: Secondary | ICD-10-CM | POA: Diagnosis not present

## 2017-03-14 LAB — RAPID URINE DRUG SCREEN, HOSP PERFORMED
Amphetamines: NOT DETECTED
Barbiturates: NOT DETECTED
Benzodiazepines: NOT DETECTED
Cocaine: NOT DETECTED
OPIATES: NOT DETECTED
Tetrahydrocannabinol: NOT DETECTED

## 2017-03-14 LAB — URINALYSIS, ROUTINE W REFLEX MICROSCOPIC
Bilirubin Urine: NEGATIVE
Glucose, UA: NEGATIVE mg/dL
Hgb urine dipstick: NEGATIVE
Ketones, ur: NEGATIVE mg/dL
Leukocytes, UA: NEGATIVE
Nitrite: NEGATIVE
Protein, ur: NEGATIVE mg/dL
Specific Gravity, Urine: 1.021 (ref 1.005–1.030)
pH: 5 (ref 5.0–8.0)

## 2017-03-14 LAB — SALICYLATE LEVEL: Salicylate Lvl: <7 mg/dL (ref 2.8–30.0)

## 2017-03-14 LAB — VITAMIN B12: Vitamin B-12: 221 pg/mL (ref 180–914)

## 2017-03-14 LAB — LIPID PANEL
CHOL/HDL RATIO: 4.2 ratio
CHOLESTEROL: 196 mg/dL (ref 0–200)
HDL: 47 mg/dL (ref >40)
LDL Cholesterol: 134 mg/dL — ABNORMAL HIGH (ref 0–99)
TRIGLYCERIDES: 74 mg/dL (ref <150)
VLDL: 15 mg/dL (ref 0–40)

## 2017-03-14 LAB — ACETAMINOPHEN LEVEL

## 2017-03-14 LAB — TSH: TSH: 2.053 u[IU]/mL (ref 0.350–4.500)

## 2017-03-14 LAB — ETHANOL: Alcohol, Ethyl (B): <5 mg/dL

## 2017-03-14 MED ORDER — VITAMIN B-12 1000 MCG PO TABS
1000.0000 ug | ORAL_TABLET | Freq: Every day | ORAL | Status: DC
  Administered 2017-03-14 – 2017-03-15 (×2): 1000 ug via ORAL
  Filled 2017-03-14 (×2): qty 1

## 2017-03-14 MED ORDER — GADOBENATE DIMEGLUMINE 529 MG/ML IV SOLN
20.0000 mL | Freq: Once | INTRAVENOUS | Status: AC
  Administered 2017-03-14: 17 mL via INTRAVENOUS

## 2017-03-14 MED ORDER — LORAZEPAM 2 MG/ML IJ SOLN
1.0000 mg | Freq: Once | INTRAMUSCULAR | Status: AC
  Administered 2017-03-14: 1 mg via INTRAVENOUS
  Filled 2017-03-14: qty 1

## 2017-03-14 NOTE — Progress Notes (Signed)
Responded to consult to create advanced directive. Conferrted w/ nurse, who said pt may be able to do it, may be too sleepy. It was the latter today. Pt was sitting up in chair eating when I arrived, and was supposed to be lying down then for a particular test. Pt didn't really engage w/ me, wife briefly did, and mainly  daughter heard my overview explanation of [facility] advanced directive form and process. Advised both nurse and pt/family that, whenever pt was able to make his own decision as to form choices, nurse could page for a chaplin at any time.   03/14/17 1300  Clinical Encounter Type  Visited With Patient and family together;Health care provider  Visit Type Initial;Psychological support;Spiritual support;Social support  Referral From Nurse  Spiritual Encounters  Spiritual Needs Brochure;Emotional  Stress Factors  Patient Stress Factors Health changes;Loss of control  Family Stress Factors Family relationships;Health changes;Loss of control   Gerrit Heck, Chaplain

## 2017-03-14 NOTE — Evaluation (Signed)
Physical Therapy Evaluation Patient Details Name: Danny Krause MRN: [id number] DOB: Aug 03, 1941 Today's Date: 03/14/2017   History of Present Illness  Pt is a 76 y/o male admitted due to AMS secondary to having a seizure at home. No pertinent PMH; however, pt has not been to a doctor in 35+ years.  Clinical Impression  Pt presented supine in bed with HOB elevated, awake and willing to participate in therapy session. Prior to admission, pt reported that he was independent with all functional mobility and ADLs. Pt lives with his wife in a single-level home and has a son that lives very close by as well. Pt currently requires close min guard for safety with transfers and constant min A to ambulate with use of SPC. Pt with one significant LOB during ambulation, requiring mod A to maintain upright balance. Pt limited secondary to reports of fluctuating bouts of dizziness throughout. Pt would continue to benefit from skilled physical therapy services at this time while admitted and after d/c to address the below listed limitations in order to improve overall safety and independence with functional mobility.     Follow Up Recommendations Home health PT;Supervision/Assistance - 24 hour    Equipment Recommendations  Rolling walker with 5" wheels    Recommendations for Other Services       Precautions / Restrictions Precautions Precautions: Fall;Other (comment) (seizure) Restrictions Weight Bearing Restrictions: No      Mobility  Bed Mobility Overal bed mobility: Modified Independent                Transfers Overall transfer level: Needs assistance Equipment used: None Transfers: Sit to/from Stand Sit to Stand: Min guard         General transfer comment: min guard for safety  Ambulation/Gait Ambulation/Gait assistance: Min assist;Mod assist Ambulation Distance (Feet): 20 Feet Assistive device: Straight cane Gait Pattern/deviations: Step-through pattern;Decreased step  length - right;Decreased step length - left;Decreased stride length;Trunk flexed Gait velocity: decreased Gait velocity interpretation: <1.8 ft/sec, indicative of risk for recurrent falls General Gait Details: modest instability throughout, requiring constant min A with use of SPC; pt with one significant LOB towards his R side, requiring mod A to maintain upright balance  Stairs            Wheelchair Mobility    Modified Rankin (Stroke Patients Only)       Balance Overall balance assessment: Needs assistance Sitting-balance support: No upper extremity supported;Feet supported Sitting balance-Leahy Scale: Good     Standing balance support: During functional activity;Single extremity supported Standing balance-Leahy Scale: Poor Standing balance comment: pt reliant on external support                             Pertinent Vitals/Pain Pain Assessment: No/denies pain    Home Living Family/patient expects to be discharged to:: Private residence Living Arrangements: Spouse/significant other Available Help at Discharge: Family;Available 24 hours/day Type of Home: House Home Access: Stairs to enter Entrance Stairs-Rails: None Entrance Stairs-Number of Steps: 1 Home Layout: One level Home Equipment: Walker - 2 wheels;Cane - single point;Shower seat;Shower seat - built in      Prior Function Level of Independence: Independent               Journalist, newspaper        Extremity/Trunk Assessment   Upper Extremity Assessment Upper Extremity Assessment: Overall WFL for tasks assessed    Lower Extremity Assessment Lower Extremity Assessment: Generalized weakness  Communication   Communication: No difficulties  Cognition Arousal/Alertness: Awake/alert Behavior During Therapy: WFL for tasks assessed/performed Overall Cognitive Status: Impaired/Different from baseline Area of Impairment: Safety/judgement                          Safety/Judgement: Decreased awareness of safety;Decreased awareness of deficits            General Comments      Exercises     Assessment/Plan    PT Assessment Patient needs continued PT services  PT Problem List Decreased strength;Decreased activity tolerance;Decreased balance;Decreased mobility;Decreased coordination;Decreased knowledge of use of DME;Decreased safety awareness;Decreased knowledge of precautions       PT Treatment Interventions DME instruction;Gait training;Functional mobility training;Stair training;Therapeutic activities;Therapeutic exercise;Balance training;Neuromuscular re-education;Patient/family education    PT Goals (Current goals can be found in the Care Plan section)  Acute Rehab PT Goals Patient Stated Goal: return home PT Goal Formulation: With patient/family Time For Goal Achievement: 03/28/17 Potential to Achieve Goals: Good    Frequency Min 3X/week   Barriers to discharge        Co-evaluation               AM-PAC PT "6 Clicks" Daily Activity  Outcome Measure Difficulty turning over in bed (including adjusting bedclothes, sheets and blankets)?: None Difficulty moving from lying on back to sitting on the side of the bed? : None Difficulty sitting down on and standing up from a chair with arms (e.g., wheelchair, bedside commode, etc,.)?: A Little Help needed moving to and from a bed to chair (including a wheelchair)?: A Little Help needed walking in hospital room?: A Lot Help needed climbing 3-5 steps with a railing? : A Lot 6 Click Score: 18    End of Session Equipment Utilized During Treatment: Gait belt Activity Tolerance: Patient tolerated treatment well Patient left: in bed;with call bell/phone within reach;with bed alarm set;with family/visitor present Nurse Communication: Mobility status PT Visit Diagnosis: Other abnormalities of gait and mobility (R26.89)    Time: 1478-2956 PT Time Calculation (min) (ACUTE ONLY): 25  min   Charges:   PT Evaluation $PT Eval Moderate Complexity: 1 Procedure PT Treatments $Therapeutic Activity: 8-22 mins   PT G Codes:   PT G-Codes **NOT FOR INPATIENT CLASS** Functional Assessment Tool Used: AM-PAC 6 Clicks Basic Mobility;Clinical judgement Functional Limitation: Mobility: Walking and moving around Mobility: Walking and Moving Around Current Status (O1308): At least 40 percent but less than 60 percent impaired, limited or restricted Mobility: Walking and Moving Around Goal Status [phone]): At least 1 percent but less than 20 percent impaired, limited or restricted    [facility], [place], DPT [place] 03/14/2017, 4:38 PM

## 2017-03-14 NOTE — Procedures (Signed)
History: 76 year old male being evaluated for seizures  Sedation: None  Technique: This is a 21 channel routine scalp EEG performed at the bedside with bipolar and monopolar montages arranged in accordance to the international 10/20 system of electrode placement. One channel was dedicated to EKG recording.    Background: The background consists of intermixed alpha and beta activities. There is a well defined posterior dominant rhythm of 10 Hz that attenuates with eye opening. Sleep is not recorded. No epileptiform discharges were seen.   Photic stimulation: Physiologic driving is not performed  EEG Abnormalities: None  Clinical Interpretation: This normal EEG is recorded in the waking  state. There was no seizure or seizure predisposition recorded on this study. Please note that a normal EEG does not preclude the possibility of epilepsy.   Roland Rack, MD [facility] [phone]  If 7pm- 7am, please page neurology on call as listed in [place].

## 2017-03-14 NOTE — Progress Notes (Signed)
Spoke with Jerene Pitch from MRI who stated that pt had MRI but was unsuccessful. Text paged Md, new order for Ativan.

## 2017-03-14 NOTE — Progress Notes (Signed)
[facility] PROGRESS NOTE  Danny Krause BMW:[id number] DOB: 1941-06-12 DOA: 03/13/2017   PCP: No primary care provider on file.     Assessment/Plan: Principal Problem:   Seizure ([place])   Danny Krause is a 76 y.o. male with no significant medical history, hasnt been to doctor in 35 years.  Patient brought in to ED by EMS after his wife found him down after he was LKW at 2pm cutting trees.  Abrasions to elbows, EMS reports unresponsive initially with R sided gaze. Initially evaluated for possible stroke versus seizure. 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 EEG negative MRI brain negative, no evidence of CVA Telemetry uneventful TSH within normal limits Vitamin B-12 low Neurology convinced that the patient will has new onset seizures Continue Keppra 500 mg twice a day Patient has been advised not to drive Follow-up with neurology in the next 2-4 weeks UDS negative   Multiple abrasions Patient noted to have abrasions on both elbows PT OT evaluation pending X-rays of the elbow and lumbar spine pending   Hyperglycemia on admission Hemoglobin A1c pending  DVT prophylaxsis Lovenox  Code Status:  Full code    Family Communication: Discussed in detail with the patient, all imaging results, lab results explained to the patient   Disposition Plan: Anticipate discharge tomorrow    Consultants:  Neurology  Procedures:  None  Antibiotics: Anti-infectives    None         HPI/Subjective: Patient has not ambulated, complaining of pain in his lower back bilateral elbows,   Objective: Vitals:   03/14/17 0300 03/14/17 0508 03/14/17 0653 03/14/17 0900  BP: 96/60 98/60 (!) 99/56 (!) 96/59  Pulse: 63 69 (!) 55 (!) 56  Resp: 20 20 16 16   Temp: 98.3 F (36.8 C) 98.1 F (36.7 C) 98.3 F (36.8 C) 97.9 F (36.6 C)  TempSrc: Oral Oral Oral Oral  SpO2: 95% 95% 97% 97%    Intake/Output Summary (Last 24  hours) at 03/14/17 1145 Last data filed at 03/14/17 2725  Gross per 24 hour  Intake                0 ml  Output              500 ml  Net             -500 ml    Exam:  Examination:  General exam: Appears calm and comfortable  Respiratory system: Clear to auscultation. Respiratory effort normal. Cardiovascular system: S1 & S2 heard, RRR. No JVD, murmurs, rubs, gallops or clicks. No pedal edema. Gastrointestinal system: Abdomen is nondistended, soft and nontender. No organomegaly or masses felt. Normal bowel sounds heard. Central nervous system: Alert and oriented. No focal neurological deficits. Extremities: Symmetric 5 x 5 power. Skin: No rashes, lesions or ulcers Psychiatry: Judgement and insight appear normal. Mood & affect appropriate.     Data Reviewed: I have personally reviewed following labs and imaging studies  Micro Results No results found for this or any previous visit (from the past 240 hour(s)).  Radiology Reports Ct Cervical Spine Wo Contrast  Result Date: 03/13/2017 CLINICAL DATA:  Jerelyn Scott on the ground. EXAM: CT HEAD WITHOUT CONTRAST CT CERVICAL SPINE WITHOUT CONTRAST TECHNIQUE: Multidetector CT imaging of the head and cervical spine was performed following the standard protocol without intravenous contrast. Multiplanar CT image reconstructions of the cervical spine were also generated. COMPARISON:  None. FINDINGS: CT HEAD FINDINGS Brain:  No sign of acute infarction, mass lesion, hemorrhage, hydrocephalus or extra-axial collection. Few old small vessel changes of the left basal ganglia. Vascular: There is atherosclerotic calcification of the major vessels at the base of the brain. Skull: Negative Sinuses/Orbits: Clear/ normal Other: None CT CERVICAL SPINE FINDINGS Alignment: Some straightening of the normal cervical lordosis. No traumatic malalignment. Skull base and vertebrae: No fracture or focal lesion. Soft tissues and spinal canal: Negative Disc levels: Degenerative  spondylosis from C2-3 through C6-7. 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 Upper chest: Advanced emphysema. Other: None significant IMPRESSION: Head CT: No acute finding. Mild chronic small-vessel change of the left basal ganglia. Aspects 10. Cervical spine CT: No acute or traumatic finding. Chronic spondylosis. Emphysema. These results were discussed in person at the time of interpretation on 03/13/2017 at 3:48 pm to Dr. Rory Percy , who verbally acknowledged these results. Electronically Signed   By: Nelson Chimes M.D.   On: 03/13/2017 15:55   Mr Jeri Cos NW Contrast  Result Date: 03/14/2017 CLINICAL DATA:  Found down.  Possible seizure or stroke. EXAM: MRI HEAD WITHOUT AND WITH CONTRAST TECHNIQUE: Multiplanar, multiecho pulse sequences of the brain and surrounding structures were obtained without and with intravenous contrast. CONTRAST:  51mL MULTIHANCE GADOBENATE DIMEGLUMINE 529 MG/ML IV SOLN COMPARISON:  Motion degraded head CT 03/13/2017. FINDINGS: The patient was unable to remain motionless for the exam. Small or subtle lesions could be overlooked. Brain: No evidence for acute infarction, hemorrhage, mass lesion, hydrocephalus, or extra-axial fluid. Moderate cerebral and cerebellar atrophy. Mild subcortical and periventricular T2 and FLAIR hyperintensities, likely chronic microvascular ischemic change. Post infusion, no abnormal enhancement of the brain or meninges. Vascular: Flow voids are maintained throughout the carotid, basilar, and vertebral arteries. There are no areas of chronic hemorrhage. Skull and upper cervical spine: Normal marrow signal. Small midline scalp hematoma near the vertex. Sinuses/Orbits: No acute finding. Other: Unremarkable. IMPRESSION: Motion degraded examination demonstrating no definite acute or focal intracranial abnormality. Atrophy and small vessel disease. No visible abnormal postcontrast enhancement. Small scalp  hematoma near the vertex. Electronically Signed   By: Staci Righter M.D.   On: 03/14/2017 11:09   Dg Chest Port 1 View  Result Date: 03/13/2017 CLINICAL DATA:  Altered mental status EXAM: PORTABLE CHEST 1 VIEW COMPARISON:  None. FINDINGS: Borderline cardiomegaly. Grossly negative mediastinal contours when accounting for rightward rotation. Nonspecific interstitial coarsening. No Kerley lines, effusion, or pneumothorax. IMPRESSION: 1. No definite acute disease. 2. Interstitial coarsening which could be bronchitic, atelectatic, or from pneumonitis. 3. Borderline cardiomegaly. Electronically Signed   By: Monte Fantasia M.D.   On: 03/13/2017 16:49   Ct Head Code Stroke W/o Cm  Result Date: 03/13/2017 CLINICAL DATA:  Jerelyn Scott on the ground. EXAM: CT HEAD WITHOUT CONTRAST CT CERVICAL SPINE WITHOUT CONTRAST TECHNIQUE: Multidetector CT imaging of the head and cervical spine was performed following the standard protocol without intravenous contrast. Multiplanar CT image reconstructions of the cervical spine were also generated. COMPARISON:  None. FINDINGS: CT HEAD FINDINGS Brain: No sign of acute infarction, mass lesion, hemorrhage, hydrocephalus or extra-axial collection. Few old small vessel changes of the left basal ganglia. Vascular: There is atherosclerotic calcification of the major vessels at the base of the brain. Skull: Negative Sinuses/Orbits: Clear/ normal Other: None CT CERVICAL SPINE FINDINGS Alignment: Some straightening of the normal cervical lordosis. No traumatic malalignment. Skull base and vertebrae: No fracture or focal lesion. Soft tissues and spinal  canal: Negative Disc levels: Degenerative spondylosis from C2-3 through C6-7. 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 Upper chest: Advanced emphysema. Other: None significant IMPRESSION: Head CT: No acute finding. Mild chronic small-vessel change of the left basal ganglia. Aspects 10.  Cervical spine CT: No acute or traumatic finding. Chronic spondylosis. Emphysema. These results were discussed in person at the time of interpretation on 03/13/2017 at 3:48 pm to Dr. Rory Percy , who verbally acknowledged these results. Electronically Signed   By: Nelson Chimes M.D.   On: 03/13/2017 15:55     CBC  Recent Labs Lab 03/13/17 1528 03/13/17 1534  WBC 12.0*  --   HGB 12.9* 13.6  HCT 39.5 40.0  PLT 216  --   MCV 82.1  --   MCH 26.8  --   MCHC 32.7  --   RDW 13.8  --   LYMPHSABS 1.3  --   MONOABS 0.9  --   EOSABS 0.3  --   BASOSABS 0.0  --     Chemistries   Recent Labs Lab 03/13/17 1528 03/13/17 1534  NA 133* 135  K 4.2 4.3  CL 102 102  CO2 20*  --   GLUCOSE 186* 186*  BUN 20 22*  CREATININE 1.39* 1.20  CALCIUM 9.4  --   AST 24  --   ALT 17  --   ALKPHOS 66  --   BILITOT 0.6  --    ------------------------------------------------------------------------------------------------------------------ CrCl cannot be calculated (Unknown ideal weight.). ------------------------------------------------------------------------------------------------------------------ No results for input(s): HGBA1C in the last 72 hours. ------------------------------------------------------------------------------------------------------------------  Recent Labs  03/13/17 2338  CHOL 196  HDL 47  LDLCALC 134*  TRIG 74  CHOLHDL 4.2   ------------------------------------------------------------------------------------------------------------------  Recent Labs  03/13/17 2335  TSH 2.053   ------------------------------------------------------------------------------------------------------------------  Recent Labs  03/13/17 2335  VITAMINB12 221    Coagulation profile  Recent Labs Lab 03/13/17 1528  INR 0.99    No results for input(s): DDIMER in the last 72 hours.  Cardiac Enzymes No results for input(s): CKMB, TROPONINI, MYOGLOBIN in the last 168  hours.  Invalid input(s): CK ------------------------------------------------------------------------------------------------------------------ Invalid input(s): POCBNP   CBG:  Recent Labs Lab 03/13/17 1528  GLUCAP 185*       Studies: Ct Cervical Spine Wo Contrast  Result Date: 03/13/2017 CLINICAL DATA:  Jerelyn Scott on the ground. EXAM: CT HEAD WITHOUT CONTRAST CT CERVICAL SPINE WITHOUT CONTRAST TECHNIQUE: Multidetector CT imaging of the head and cervical spine was performed following the standard protocol without intravenous contrast. Multiplanar CT image reconstructions of the cervical spine were also generated. COMPARISON:  None. FINDINGS: CT HEAD FINDINGS Brain: No sign of acute infarction, mass lesion, hemorrhage, hydrocephalus or extra-axial collection. Few old small vessel changes of the left basal ganglia. Vascular: There is atherosclerotic calcification of the major vessels at the base of the brain. Skull: Negative Sinuses/Orbits: Clear/ normal Other: None CT CERVICAL SPINE FINDINGS Alignment: Some straightening of the normal cervical lordosis. No traumatic malalignment. Skull base and vertebrae: No fracture or focal lesion. Soft tissues and spinal canal: Negative Disc levels: Degenerative spondylosis from C2-3 through C6-7. 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 Upper chest: Advanced emphysema. Other: None significant IMPRESSION: Head CT: No acute finding. Mild chronic small-vessel change of the left basal ganglia. Aspects 10. Cervical spine CT: No acute or traumatic finding. Chronic spondylosis. Emphysema. These results were discussed in  person at the time of interpretation on 03/13/2017 at 3:48 pm to Dr. Rory Percy , who verbally acknowledged these results. Electronically Signed   By: Nelson Chimes M.D.   On: 03/13/2017 15:55   Mr Jeri Cos GH Contrast  Result Date: 03/14/2017 CLINICAL DATA:  Found down.  Possible seizure or stroke.  EXAM: MRI HEAD WITHOUT AND WITH CONTRAST TECHNIQUE: Multiplanar, multiecho pulse sequences of the brain and surrounding structures were obtained without and with intravenous contrast. CONTRAST:  93mL MULTIHANCE GADOBENATE DIMEGLUMINE 529 MG/ML IV SOLN COMPARISON:  Motion degraded head CT 03/13/2017. FINDINGS: The patient was unable to remain motionless for the exam. Small or subtle lesions could be overlooked. Brain: No evidence for acute infarction, hemorrhage, mass lesion, hydrocephalus, or extra-axial fluid. Moderate cerebral and cerebellar atrophy. Mild subcortical and periventricular T2 and FLAIR hyperintensities, likely chronic microvascular ischemic change. Post infusion, no abnormal enhancement of the brain or meninges. Vascular: Flow voids are maintained throughout the carotid, basilar, and vertebral arteries. There are no areas of chronic hemorrhage. Skull and upper cervical spine: Normal marrow signal. Small midline scalp hematoma near the vertex. Sinuses/Orbits: No acute finding. Other: Unremarkable. IMPRESSION: Motion degraded examination demonstrating no definite acute or focal intracranial abnormality. Atrophy and small vessel disease. No visible abnormal postcontrast enhancement. Small scalp hematoma near the vertex. Electronically Signed   By: Staci Righter M.D.   On: 03/14/2017 11:09   Dg Chest Port 1 View  Result Date: 03/13/2017 CLINICAL DATA:  Altered mental status EXAM: PORTABLE CHEST 1 VIEW COMPARISON:  None. FINDINGS: Borderline cardiomegaly. Grossly negative mediastinal contours when accounting for rightward rotation. Nonspecific interstitial coarsening. No Kerley lines, effusion, or pneumothorax. IMPRESSION: 1. No definite acute disease. 2. Interstitial coarsening which could be bronchitic, atelectatic, or from pneumonitis. 3. Borderline cardiomegaly. Electronically Signed   By: Monte Fantasia M.D.   On: 03/13/2017 16:49   Ct Head Code Stroke W/o Cm  Result Date:  03/13/2017 CLINICAL DATA:  Jerelyn Scott on the ground. EXAM: CT HEAD WITHOUT CONTRAST CT CERVICAL SPINE WITHOUT CONTRAST TECHNIQUE: Multidetector CT imaging of the head and cervical spine was performed following the standard protocol without intravenous contrast. Multiplanar CT image reconstructions of the cervical spine were also generated. COMPARISON:  None. FINDINGS: CT HEAD FINDINGS Brain: No sign of acute infarction, mass lesion, hemorrhage, hydrocephalus or extra-axial collection. Few old small vessel changes of the left basal ganglia. Vascular: There is atherosclerotic calcification of the major vessels at the base of the brain. Skull: Negative Sinuses/Orbits: Clear/ normal Other: None CT CERVICAL SPINE FINDINGS Alignment: Some straightening of the normal cervical lordosis. No traumatic malalignment. Skull base and vertebrae: No fracture or focal lesion. Soft tissues and spinal canal: Negative Disc levels: Degenerative spondylosis from C2-3 through C6-7. 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 Upper chest: Advanced emphysema. Other: None significant IMPRESSION: Head CT: No acute finding. Mild chronic small-vessel change of the left basal ganglia. Aspects 10. Cervical spine CT: No acute or traumatic finding. Chronic spondylosis. Emphysema. These results were discussed in person at the time of interpretation on 03/13/2017 at 3:48 pm to Dr. Rory Percy , who verbally acknowledged these results. Electronically Signed   By: Nelson Chimes M.D.   On: 03/13/2017 15:55      No results found for: HGBA1C Lab Results  Component Value Date   LDLCALC 134 (H) 03/13/2017   CREATININE 1.20 03/13/2017       Scheduled Meds: .  stroke:  mapping our early stages of recovery book   Does not apply Once  . enoxaparin (LOVENOX) injection  40 mg Subcutaneous Q24H  . ondansetron (ZOFRAN) IV  4 mg Intravenous Once  . vitamin B-12  1,000 mcg Oral Daily   Continuous  Infusions: . levETIRAcetam Stopped (03/14/17 0515)     LOS: 0 days    Time spent: >30 MINS    Reyne Dumas  [facility] Pager [phone]. If 7PM-7AM, please contact night-coverage at [email or web address], password [facility] 03/14/2017, 11:45 AM  LOS: 0 days

## 2017-03-14 NOTE — Progress Notes (Signed)
SLP Cancellation Note  Patient Details Name: Danny Krause MRN: [id number] DOB: 04/22/1941   Cancelled treatment:       Reason Eval/Treat Not Completed: Fatigue/lethargy limiting ability to participate  Orders were received for bedside swallow evaluation; however, when therapist attempted to see pt he was soundly asleep in bed due to RN administering Ativan prior to admission for completion of MRI.  Spoke briefly with pt's daughter who reports that pt is tolerating regular diet well.  Will follow up for bedside swallow as pt is available.     Nevea Spiewak, Selinda Orion 03/14/2017, 11:30 AM

## 2017-03-14 NOTE — Progress Notes (Signed)
Subjective: No further seizures. patient has returned almost back to baseline per family. HE is highly sedated by Ativan at this point from MRI.   Exam: Vitals:   03/14/17 0653 03/14/17 0900  BP: (!) 99/56 (!) 96/59  Pulse: (!) 55 (!) 56  Resp: 16 16  Temp: 98.3 F (36.8 C) 97.9 F (36.6 C)    HEENT-  Normocephalic, no lesions, without obvious abnormality.  Normal external eye and conjunctiva.  Normal TM's bilaterally.  Normal auditory canals and external ears. Normal external nose, mucus membranes and septum.  Normal pharynx.    Neuro:  CN: Pupils are equal and round. They are symmetrically reactive from 3-->2 mm. EOMI without nystagmus. Facial sensation is intact to light touch. Face is symmetric at rest with normal strength and mobility. Hearing is intact to conversational voice.  Motor: Normal bulk, tone, and strength. 5/5 throughout to noxious stimuli  Sensation: Intact to light touch.  DTRs: 2+, symmetric in UE with no LE DTR  Toes downgoing bilaterally. No pathologic reflexes.      Pertinent Labs/Diagnostics: EEG showed no epileptiform activity    Impression: New onset seizure of unknown etiology. EEG showed no epileptiform activity and MRI showed no pathological abnormalities intracranially. Patient has not had any further seizures since being on Keppra 500 mg twice a day while in hospital. Patient currently highly sedated on Ativan after MRI.   Recommendations: 1) patient needs to follow up with neurology as an outpatient within 2-4 weeks. 2) continue Keppra 500 mg twice a day 3) Per [facility] statutes, patients with seizures are not allowed to drive until  they have been seizure-free for six months. Use caution when using heavy equipment or power tools. Avoid working on ladders or at heights. Take showers instead of baths. Ensure the water temperature is not too high on the home water heater. Do not go swimming alone. When caring for infants or small children,  sit down when holding, feeding, or changing them to minimize risk of injury to the child in the event you have a seizure.   Also, Maintain good sleep hygiene. Avoid alcohol.  --> Call 911 and bring the patient back to the ED if:                   A.  The seizure lasts longer than 5 minutes.                  B.  The patient doesn't awaken shortly after the seizure             C.  The patient has new problems such as difficulty seeing, speaking or moving             D.  The patient was injured during the seizure             E.  The patient has a temperature over 102 F (39C)             F.  The patient vomited and now is having trouble breathing   Neurology will sign off at this time thank you   Etta Quill PA-C [facility] [phone] 03/14/2017, 11:21 AM

## 2017-03-15 ENCOUNTER — Encounter (HOSPITAL_COMMUNITY): Payer: Self-pay | Admitting: *Deleted

## 2017-03-15 DIAGNOSIS — R4189 Other symptoms and signs involving cognitive functions and awareness: Secondary | ICD-10-CM

## 2017-03-15 DIAGNOSIS — R4182 Altered mental status, unspecified: Secondary | ICD-10-CM

## 2017-03-15 LAB — COMPREHENSIVE METABOLIC PANEL
ALT: 13 U/L — ABNORMAL LOW (ref 17–63)
ANION GAP: 7 (ref 5–15)
AST: 15 U/L (ref 15–41)
Albumin: 3.3 g/dL — ABNORMAL LOW (ref 3.5–5.0)
Alkaline Phosphatase: 58 U/L (ref 38–126)
BUN: 16 mg/dL (ref 6–20)
CALCIUM: 8.7 mg/dL — AB (ref 8.9–10.3)
CHLORIDE: 104 mmol/L (ref 101–111)
CO2: 24 mmol/L (ref 22–32)
Creatinine, Ser: 1.1 mg/dL (ref 0.61–1.24)
GFR calc non Af Amer: >60 mL/min (ref >60)
Glucose, Bld: 113 mg/dL — ABNORMAL HIGH (ref 65–99)
Potassium: 3.8 mmol/L (ref 3.5–5.1)
SODIUM: 135 mmol/L (ref 135–145)
Total Bilirubin: 0.5 mg/dL (ref 0.3–1.2)
Total Protein: 5.7 g/dL — ABNORMAL LOW (ref 6.5–8.1)

## 2017-03-15 LAB — HEMOGLOBIN A1C
Hgb A1c MFr Bld: 5.9 % — ABNORMAL HIGH (ref 4.8–5.6)
Mean Plasma Glucose: 123 mg/dL

## 2017-03-15 LAB — CBC
HCT: 37.4 % — ABNORMAL LOW (ref 39.0–52.0)
HEMOGLOBIN: 11.7 g/dL — AB (ref 13.0–17.0)
MCH: 26.2 pg (ref 26.0–34.0)
MCHC: 31.3 g/dL (ref 30.0–36.0)
MCV: 83.7 fL (ref 78.0–100.0)
Platelets: 194 10*3/uL (ref 150–400)
RBC: 4.47 MIL/uL (ref 4.22–5.81)
RDW: 14 % (ref 11.5–15.5)
WBC: 9.2 10*3/uL (ref 4.0–10.5)

## 2017-03-15 MED ORDER — LEVETIRACETAM 500 MG PO TABS: 500.0000 mg | ORAL_TABLET | Freq: Two times a day (BID) | ORAL | 4 refills | Status: DC

## 2017-03-15 MED ORDER — CYANOCOBALAMIN 1000 MCG PO TABS: 2000.0000 ug | ORAL_TABLET | Freq: Every day | ORAL | 3 refills | Status: DC

## 2017-03-15 NOTE — Progress Notes (Signed)
Pt discharging at this time with son taking all personal belongings. IV discontinued, dry dressing applied. Discharge instructions provided. Pt will follow up with PCP and Home health services provided per dc summary.

## 2017-03-15 NOTE — Care Management Note (Signed)
Case Management Note  Patient Details  Name: Danny Krause MRN: [id number] Date of Birth: 03/26/1941  Subjective/Objective:                    Action/Plan: Patient discharging home with HH services. CM met with the patient and his son and provided a list of HH agencies. He selected [facility]. Carolyn with [facility] notified and accepted the referral. Pt with orders for walker. Karen with [facility] will deliver the equipment to the room.  CM was able to obtain a PCP and appointment for the patient with Dr Kim. Information on the AVS Son to provide transportation home.   Expected Discharge Date:  03/15/17               Expected Discharge Plan:  Home w Home Health Services  In-House Referral:     Discharge planning Services  CM Consult  Post Acute Care Choice:  Home Health, Durable Medical Equipment Choice offered to:  Patient, Adult Children  DME Arranged:  Walker rolling DME Agency:  [facility]  HH Arranged:  PT, OT, Nurse's Aide HH Agency:  [facility]  Status of Service:  Completed, signed off  If discussed at Long Length of Stay Meetings, dates discussed:    Additional Comments:   F , RN 03/15/2017, 11:49 AM  

## 2017-03-15 NOTE — Discharge Summary (Addendum)
Physician Discharge Summary  Danny Krause MRN: [id number] DOB/AGE: 76/06/1941 76 y.o.  PCP: No primary care provider on file.   Admit date: 03/13/2017 Discharge date: 03/15/2017  Discharge Diagnoses:    Principal Problem:   Seizure [facility]) Active Problems: Acute encephalopathy due to seizure   Unresponsive    Follow-up recommendations Follow-up with PCP in 3-5 days , including all  additional recommended appointments as below Follow-up CBC, CMP in 3-5 days Patient has been referred to outpatient neurology for follow-up in 2-4 weeks Patient is advised not to drive until seizure free for 6 months Repeat hemoglobin A1c in 3 months     Current Discharge Medication List    START taking these medications   Details  levETIRAcetam (KEPPRA) 500 MG tablet Take 1 tablet (500 mg total) by mouth 2 (two) times daily. Qty: 60 tablet, Refills: 4    vitamin B-12 1000 MCG tablet Take 2 tablets (2,000 mcg total) by mouth daily. Qty: 60 tablet, Refills: 3         Discharge Condition: Stable   Discharge Instructions Get Medicines reviewed and adjusted: Please take all your medications with you for your next visit with your Primary MD  Please request your Primary MD to go over all hospital tests and procedure/radiological results at the follow up, please ask your Primary MD to get all Hospital records sent to his/her office.  If you experience worsening of your admission symptoms, develop shortness of breath, life threatening emergency, suicidal or homicidal thoughts you must seek medical attention immediately by calling 911 or calling your MD immediately if symptoms less severe.  You must read complete instructions/literature along with all the possible adverse reactions/side effects for all the Medicines you take and that have been prescribed to you. Take any new Medicines after you have completely understood and accpet all the possible adverse reactions/side effects.   Do not  drive when taking Pain medications.   Do not take more than prescribed Pain, Sleep and Anxiety Medications  Special Instructions: If you have smoked or chewed Tobacco in the last 2 yrs please stop smoking, stop any regular Alcohol and or any Recreational drug use.  Wear Seat belts while driving.  Please note  You were cared for by a hospitalist during your hospital stay. Once you are discharged, your primary care physician will handle any further medical issues. Please note that NO REFILLS for any discharge medications will be authorized once you are discharged, as it is imperative that you return to your primary care physician (or establish a relationship with a primary care physician if you do not have one) for your aftercare needs so that they can reassess your need for medications and monitor your lab values.  Discharge Instructions    Ambulatory referral to Neurology    Complete by:  As directed    New onset seizure in 2-4 wks   Diet - low sodium heart healthy    Complete by:  As directed    Increase activity slowly    Complete by:  As directed        No Known Allergies    Disposition: Home with family and home health   Consults:  Neurology  Significant Diagnostic Studies:  Dg Lumbar Spine Complete  Result Date: 03/14/2017 CLINICAL DATA:  Status post fall.  Pain for 2 days. EXAM: LUMBAR SPINE - COMPLETE 4+ VIEW COMPARISON:  None. FINDINGS: There are 5 nonrib bearing lumbar-type vertebral bodies. The vertebral body heights are maintained. There is generalized  osteopenia. The alignment is anatomic. There is no static listhesis. There is no spondylolysis. There is no acute fracture. There is degenerative disc disease disc height loss at L4-5 and L5-S1. Bilateral facet arthropathy at L4-5 and L5-S1. The SI joints are unremarkable. 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 Electronically Signed   By: Kathreen Devoid    On: 03/14/2017 13:16   Dg Elbow 2 Views Left  Result Date: 03/14/2017 CLINICAL DATA:  Bilateral elbow pain since a fall 2 days ago. Initial encounter. EXAM: LEFT ELBOW - 2 VIEW COMPARISON:  None. FINDINGS: There is no evidence of fracture, dislocation, or joint effusion. There is no evidence of arthropathy or other focal bone abnormality. Soft tissues are unremarkable. IMPRESSION: Negative exam. Electronically Signed   By: Inge Rise M.D.   On: 03/14/2017 13:10   Dg Elbow 2 Views Right  Result Date: 03/14/2017 CLINICAL DATA:  Bilateral elbow pain since a fall 2 days ago. Initial encounter. EXAM: RIGHT ELBOW - 2 VIEW COMPARISON:  None. FINDINGS: There is no evidence of fracture, dislocation, or joint effusion. There is no evidence of arthropathy or other focal bone abnormality. Soft tissues are unremarkable. IMPRESSION: Negative exam. Electronically Signed   By: Inge Rise M.D.   On: 03/14/2017 13:09   Ct Cervical Spine Wo Contrast  Result Date: 03/13/2017 CLINICAL DATA:  Jerelyn Scott on the ground. EXAM: CT HEAD WITHOUT CONTRAST CT CERVICAL SPINE WITHOUT CONTRAST TECHNIQUE: Multidetector CT imaging of the head and cervical spine was performed following the standard protocol without intravenous contrast. Multiplanar CT image reconstructions of the cervical spine were also generated. COMPARISON:  None. FINDINGS: CT HEAD FINDINGS Brain: No sign of acute infarction, mass lesion, hemorrhage, hydrocephalus or extra-axial collection. Few old small vessel changes of the left basal ganglia. Vascular: There is atherosclerotic calcification of the major vessels at the base of the brain. Skull: Negative Sinuses/Orbits: Clear/ normal Other: None CT CERVICAL SPINE FINDINGS Alignment: Some straightening of the normal cervical lordosis. No traumatic malalignment. Skull base and vertebrae: No fracture or focal lesion. Soft tissues and spinal canal: Negative Disc levels: Degenerative spondylosis from C2-3 through  C6-7. 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 Upper chest: Advanced emphysema. Other: None significant IMPRESSION: Head CT: No acute finding. Mild chronic small-vessel change of the left basal ganglia. Aspects 10. Cervical spine CT: No acute or traumatic finding. Chronic spondylosis. Emphysema. These results were discussed in person at the time of interpretation on 03/13/2017 at 3:48 pm to Dr. Rory Percy , who verbally acknowledged these results. Electronically Signed   By: Nelson Chimes M.D.   On: 03/13/2017 15:55   Mr Jeri Cos ZO Contrast  Result Date: 03/14/2017 CLINICAL DATA:  Found down.  Possible seizure or stroke. EXAM: MRI HEAD WITHOUT AND WITH CONTRAST TECHNIQUE: Multiplanar, multiecho pulse sequences of the brain and surrounding structures were obtained without and with intravenous contrast. CONTRAST:  7m MULTIHANCE GADOBENATE DIMEGLUMINE 529 MG/ML IV SOLN COMPARISON:  Motion degraded head CT 03/13/2017. FINDINGS: The patient was unable to remain motionless for the exam. Small or subtle lesions could be overlooked. Brain: No evidence for acute infarction, hemorrhage, mass lesion, hydrocephalus, or extra-axial fluid. Moderate cerebral and cerebellar atrophy. Mild subcortical and periventricular T2 and FLAIR hyperintensities, likely chronic microvascular ischemic change. Post infusion, no abnormal enhancement of the brain or meninges. Vascular: Flow voids are maintained throughout the carotid, basilar, and  vertebral arteries. There are no areas of chronic hemorrhage. Skull and upper cervical spine: Normal marrow signal. Small midline scalp hematoma near the vertex. Sinuses/Orbits: No acute finding. Other: Unremarkable. IMPRESSION: Motion degraded examination demonstrating no definite acute or focal intracranial abnormality. Atrophy and small vessel disease. No visible abnormal postcontrast enhancement. Small scalp hematoma near the vertex.  Electronically Signed   By: Staci Righter M.D.   On: 03/14/2017 11:09   Dg Chest Port 1 View  Result Date: 03/13/2017 CLINICAL DATA:  Altered mental status EXAM: PORTABLE CHEST 1 VIEW COMPARISON:  None. FINDINGS: Borderline cardiomegaly. Grossly negative mediastinal contours when accounting for rightward rotation. Nonspecific interstitial coarsening. No Kerley lines, effusion, or pneumothorax. IMPRESSION: 1. No definite acute disease. 2. Interstitial coarsening which could be bronchitic, atelectatic, or from pneumonitis. 3. Borderline cardiomegaly. Electronically Signed   By: Monte Fantasia M.D.   On: 03/13/2017 16:49   Ct Head Code Stroke W/o Cm  Result Date: 03/13/2017 CLINICAL DATA:  Jerelyn Scott on the ground. EXAM: CT HEAD WITHOUT CONTRAST CT CERVICAL SPINE WITHOUT CONTRAST TECHNIQUE: Multidetector CT imaging of the head and cervical spine was performed following the standard protocol without intravenous contrast. Multiplanar CT image reconstructions of the cervical spine were also generated. COMPARISON:  None. FINDINGS: CT HEAD FINDINGS Brain: No sign of acute infarction, mass lesion, hemorrhage, hydrocephalus or extra-axial collection. Few old small vessel changes of the left basal ganglia. Vascular: There is atherosclerotic calcification of the major vessels at the base of the brain. Skull: Negative Sinuses/Orbits: Clear/ normal Other: None CT CERVICAL SPINE FINDINGS Alignment: Some straightening of the normal cervical lordosis. No traumatic malalignment. Skull base and vertebrae: No fracture or focal lesion. Soft tissues and spinal canal: Negative Disc levels: Degenerative spondylosis from C2-3 through C6-7. 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 Upper chest: Advanced emphysema. Other: None significant IMPRESSION: Head CT: No acute finding. Mild chronic small-vessel change of the left basal ganglia. Aspects 10. Cervical spine CT: No acute or  traumatic finding. Chronic spondylosis. Emphysema. These results were discussed in person at the time of interpretation on 03/13/2017 at 3:48 pm to Dr. Rory Percy , who verbally acknowledged these results. Electronically Signed   By: Nelson Chimes M.D.   On: 03/13/2017 15:55       Filed Weights   03/15/17 0900  Weight: 75.8 kg (167 lb)       Labs: Results for orders placed or performed during the hospital encounter of 03/13/17 (from the past 48 hour(s))  Protime-INR     Status: None   Collection Time: 03/13/17  3:28 PM  Result Value Ref Range   Prothrombin Time 13.1 11.4 - 15.2 seconds   INR 0.99   APTT     Status: Abnormal   Collection Time: 03/13/17  3:28 PM  Result Value Ref Range   aPTT 39 (H) 24 - 36 seconds    Comment:        IF BASELINE aPTT IS ELEVATED, SUGGEST PATIENT RISK ASSESSMENT BE USED TO DETERMINE APPROPRIATE ANTICOAGULANT THERAPY.   CBC     Status: Abnormal   Collection Time: 03/13/17  3:28 PM  Result Value Ref Range   WBC 12.0 (H) 4.0 - 10.5 K/uL   RBC 4.81 4.22 - 5.81 MIL/uL   Hemoglobin 12.9 (L) 13.0 - 17.0 g/dL   HCT 39.5 39.0 - 52.0 %   MCV 82.1 78.0 - 100.0 fL   MCH 26.8 26.0 -  34.0 pg   MCHC 32.7 30.0 - 36.0 g/dL   RDW 13.8 11.5 - 15.5 %   Platelets 216 150 - 400 K/uL  Differential     Status: Abnormal   Collection Time: 03/13/17  3:28 PM  Result Value Ref Range   Neutrophils Relative % 81 %   Neutro Abs 9.5 (H) 1.7 - 7.7 K/uL   Lymphocytes Relative 10 %   Lymphs Abs 1.3 0.7 - 4.0 K/uL   Monocytes Relative 7 %   Monocytes Absolute 0.9 0.1 - 1.0 K/uL   Eosinophils Relative 2 %   Eosinophils Absolute 0.3 0.0 - 0.7 K/uL   Basophils Relative 0 %   Basophils Absolute 0.0 0.0 - 0.1 K/uL  Comprehensive metabolic panel     Status: Abnormal   Collection Time: 03/13/17  3:28 PM  Result Value Ref Range   Sodium 133 (L) 135 - 145 mmol/L   Potassium 4.2 3.5 - 5.1 mmol/L   Chloride 102 101 - 111 mmol/L   CO2 20 (L) 22 - 32 mmol/L   Glucose, Bld 186 (H)  65 - 99 mg/dL   BUN 20 6 - 20 mg/dL   Creatinine, Ser 1.39 (H) 0.61 - 1.24 mg/dL   Calcium 9.4 8.9 - 10.3 mg/dL   Total Protein 6.7 6.5 - 8.1 g/dL   Albumin 4.0 3.5 - 5.0 g/dL   AST 24 15 - 41 U/L   ALT 17 17 - 63 U/L   Alkaline Phosphatase 66 38 - 126 U/L   Total Bilirubin 0.6 0.3 - 1.2 mg/dL   GFR calc non Af Amer 48 (L) >60 mL/min   GFR calc Af Amer 55 (L) >60 mL/min    Comment: (NOTE) The eGFR has been calculated using the CKD EPI equation. This calculation has not been validated in all clinical situations. eGFR's persistently <60 mL/min signify possible Chronic Kidney Disease.    Anion gap 11 5 - 15  CBG monitoring, ED     Status: Abnormal   Collection Time: 03/13/17  3:28 PM  Result Value Ref Range   Glucose-Capillary 185 (H) 65 - 99 mg/dL   Comment 1 Notify RN   I-stat troponin, ED (not at [facility], [facility])     Status: None   Collection Time: 03/13/17  3:32 PM  Result Value Ref Range   Troponin i, poc 0.07 0.00 - 0.08 ng/mL   Comment 3            Comment: Due to the release kinetics of cTnI, a negative result within the first hours of the onset of symptoms does not rule out myocardial infarction with certainty. If myocardial infarction is still suspected, repeat the test at appropriate intervals.   I-Stat Chem 8, ED  (not at [facility], [facility])     Status: Abnormal   Collection Time: 03/13/17  3:34 PM  Result Value Ref Range   Sodium 135 135 - 145 mmol/L   Potassium 4.3 3.5 - 5.1 mmol/L   Chloride 102 101 - 111 mmol/L   BUN 22 (H) 6 - 20 mg/dL   Creatinine, Ser 1.20 0.61 - 1.24 mg/dL   Glucose, Bld 186 (H) 65 - 99 mg/dL   Calcium, Ion 1.09 (L) 1.15 - 1.40 mmol/L   TCO2 23 0 - 100 mmol/L   Hemoglobin 13.6 13.0 - 17.0 g/dL   HCT 40.0 39.0 - 52.0 %  Ethanol     Status: None   Collection Time: 03/13/17 11:35 PM  Result Value Ref Range  Alcohol, Ethyl (B) <5 <5 mg/dL    Comment:        LOWEST DETECTABLE LIMIT FOR SERUM ALCOHOL IS 5 mg/dL FOR MEDICAL PURPOSES ONLY    Salicylate level     Status: None   Collection Time: 03/13/17 11:35 PM  Result Value Ref Range   Salicylate Lvl <0.9 2.8 - 30.0 mg/dL  Acetaminophen level     Status: Abnormal   Collection Time: 03/13/17 11:35 PM  Result Value Ref Range   Acetaminophen (Tylenol), Serum <10 (L) 10 - 30 ug/mL    Comment:        THERAPEUTIC CONCENTRATIONS VARY SIGNIFICANTLY. A RANGE OF 10-30 ug/mL MAY BE AN EFFECTIVE CONCENTRATION FOR MANY PATIENTS. HOWEVER, SOME ARE BEST TREATED AT CONCENTRATIONS OUTSIDE THIS RANGE. ACETAMINOPHEN CONCENTRATIONS >150 ug/mL AT 4 HOURS AFTER INGESTION AND >50 ug/mL AT 12 HOURS AFTER INGESTION ARE OFTEN ASSOCIATED WITH TOXIC REACTIONS.   TSH     Status: None   Collection Time: 03/13/17 11:35 PM  Result Value Ref Range   TSH 2.053 0.350 - 4.500 uIU/mL    Comment: Performed by a 3rd Generation assay with a functional sensitivity of <=0.01 uIU/mL.  Vitamin B12     Status: None   Collection Time: 03/13/17 11:35 PM  Result Value Ref Range   Vitamin B-12 221 180 - 914 pg/mL    Comment: (NOTE) This assay is not validated for testing neonatal or myeloproliferative syndrome specimens for Vitamin B12 levels.   Hemoglobin A1c     Status: Abnormal   Collection Time: 03/13/17 11:38 PM  Result Value Ref Range   Hgb A1c MFr Bld 5.9 (H) 4.8 - 5.6 %    Comment: (NOTE)         Pre-diabetes: 5.7 - 6.4         Diabetes: >6.4         Glycemic control for adults with diabetes: <7.0    Mean Plasma Glucose 123 mg/dL    Comment: (NOTE) Performed At: [facility] [place], [place] [id number] Lindon Romp MD [id number]   Lipid panel     Status: Abnormal   Collection Time: 03/13/17 11:38 PM  Result Value Ref Range   Cholesterol 196 0 - 200 mg/dL   Triglycerides 74 <150 mg/dL   HDL 47 >40 mg/dL   Total CHOL/HDL Ratio 4.2 RATIO   VLDL 15 0 - 40 mg/dL   LDL Cholesterol 134 (H) 0 - 99 mg/dL    Comment:        Total Cholesterol/HDL:CHD  Risk Coronary Heart Disease Risk Table                     Men   Women  1/2 Average Risk   3.4   3.3  Average Risk       5.0   4.4  2 X Average Risk   9.6   7.1  3 X Average Risk  23.4   11.0        Use the calculated Patient Ratio above and the CHD Risk Table to determine the patient's CHD Risk.        ATP III CLASSIFICATION (LDL):  <100     mg/dL   Optimal  100-129  mg/dL   Near or Above                    Optimal  130-159  mg/dL   Borderline  160-189  mg/dL   High  >  190     mg/dL   Very High   Urine rapid drug screen (hosp performed)not at [facility]     Status: None   Collection Time: 03/14/17  6:15 AM  Result Value Ref Range   Opiates NONE DETECTED NONE DETECTED   Cocaine NONE DETECTED NONE DETECTED   Benzodiazepines NONE DETECTED NONE DETECTED   Amphetamines NONE DETECTED NONE DETECTED   Tetrahydrocannabinol NONE DETECTED NONE DETECTED   Barbiturates NONE DETECTED NONE DETECTED    Comment:        DRUG SCREEN FOR MEDICAL PURPOSES ONLY.  IF CONFIRMATION IS NEEDED FOR ANY PURPOSE, NOTIFY LAB WITHIN 5 DAYS.        LOWEST DETECTABLE LIMITS FOR URINE DRUG SCREEN Drug Class       Cutoff (ng/mL) Amphetamine      1000 Barbiturate      200 Benzodiazepine   388 Tricyclics       828 Opiates          300 Cocaine          300 THC              50   Urinalysis, Routine w reflex microscopic     Status: None   Collection Time: 03/14/17  6:15 AM  Result Value Ref Range   Color, Urine YELLOW YELLOW   APPearance CLEAR CLEAR   Specific Gravity, Urine 1.021 1.005 - 1.030   pH 5.0 5.0 - 8.0   Glucose, UA NEGATIVE NEGATIVE mg/dL   Hgb urine dipstick NEGATIVE NEGATIVE   Bilirubin Urine NEGATIVE NEGATIVE   Ketones, ur NEGATIVE NEGATIVE mg/dL   Protein, ur NEGATIVE NEGATIVE mg/dL   Nitrite NEGATIVE NEGATIVE   Leukocytes, UA NEGATIVE NEGATIVE  Comprehensive metabolic panel     Status: Abnormal   Collection Time: 03/15/17  5:30 AM  Result Value Ref Range   Sodium 135 135 - 145 mmol/L    Potassium 3.8 3.5 - 5.1 mmol/L   Chloride 104 101 - 111 mmol/L   CO2 24 22 - 32 mmol/L   Glucose, Bld 113 (H) 65 - 99 mg/dL   BUN 16 6 - 20 mg/dL   Creatinine, Ser 1.10 0.61 - 1.24 mg/dL   Calcium 8.7 (L) 8.9 - 10.3 mg/dL   Total Protein 5.7 (L) 6.5 - 8.1 g/dL   Albumin 3.3 (L) 3.5 - 5.0 g/dL   AST 15 15 - 41 U/L   ALT 13 (L) 17 - 63 U/L   Alkaline Phosphatase 58 38 - 126 U/L   Total Bilirubin 0.5 0.3 - 1.2 mg/dL   GFR calc non Af Amer >60 >60 mL/min   GFR calc Af Amer >60 >60 mL/min    Comment: (NOTE) The eGFR has been calculated using the CKD EPI equation. This calculation has not been validated in all clinical situations. eGFR's persistently <60 mL/min signify possible Chronic Kidney Disease.    Anion gap 7 5 - 15  CBC     Status: Abnormal   Collection Time: 03/15/17  5:30 AM  Result Value Ref Range   WBC 9.2 4.0 - 10.5 K/uL   RBC 4.47 4.22 - 5.81 MIL/uL   Hemoglobin 11.7 (L) 13.0 - 17.0 g/dL   HCT 37.4 (L) 39.0 - 52.0 %   MCV 83.7 78.0 - 100.0 fL   MCH 26.2 26.0 - 34.0 pg   MCHC 31.3 30.0 - 36.0 g/dL   RDW 14.0 11.5 - 15.5 %   Platelets 194 150 - 400 K/uL  Lipid Panel     Component Value Date/Time   CHOL 196 03/13/2017 2338   TRIG 74 03/13/2017 2338   HDL 47 03/13/2017 2338   CHOLHDL 4.2 03/13/2017 2338   VLDL 15 03/13/2017 2338   LDLCALC 134 (H) 03/13/2017 2338     Lab Results  Component Value Date   HGBA1C 5.9 (H) 03/13/2017       HPI :  Danny Krause a 76 y.o.malewith no significant medical history, hasnt been to doctor in 35 years. Patient brought in to ED by EMS after his wife found him down after he was LKW at 2pm cutting trees. Abrasions to elbows, EMS reports unresponsive initially with R sided gaze. Patient admitted for possible stroke versus seizure. 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 EEG negative MRI brain negative, no evidence of CVA Telemetry uneventful TSH within normal  limits Vitamin B-12 low, started on supplementation Neurology convinced that the patient will has new onset seizures Continue Keppra 500 mg twice a day Patient has been advised not to drive Follow-up with neurology in the next 2-4 weeks UDS negative   Multiple abrasions Patient noted to have abrasions on both elbows PT OT evaluation recommended home health and rolling walker X-rays of the elbow and lumbar spine  negative for acute fractures   Prediabetes on admission Hemoglobin A1c 5.9  Discharge Exam  Blood pressure (!) 102/55, pulse 67, temperature 98.4 F (36.9 C), temperature source Oral, resp. rate 16, height _0  (1.702 m), weight 75.8 kg (167 lb), SpO2 93 %.  General exam: Appears calm and comfortable  Respiratory system: Clear to auscultation. Respiratory effort normal. Cardiovascular system: S1 & S2 heard, RRR. No JVD, murmurs, rubs, gallops or clicks. No pedal edema. Gastrointestinal system: Abdomen is nondistended, soft and nontender. No organomegaly or masses felt. Normal bowel sounds heard. Central nervous system: Alert and oriented. No focal neurological deficits. Extremities: Symmetric 5 x 5 power. Skin: No rashes, lesions or ulcers Psychiatry: Judgement and insight appear normal. Mood & affect appropriate.     Follow-up Information    Primary care provider. Call.   Why:  Hospital follow-up in 3-5 days          Signed: Reyne Dumas 03/15/2017, 10:15 AM        Time spent >1 hour

## 2017-03-15 NOTE — Evaluation (Signed)
Occupational Therapy Evaluation and Discharge Patient Details Name: Danny Krause MRN: [id number] DOB: 03-Sep-1941 Today's Date: 03/15/2017    History of Present Illness Pt is a 76 y/o male admitted due to AMS secondary to having a seizure at home. No pertinent PMH; however, pt has not been to a doctor in 35+ years.   Clinical Impression   PTA Pt independent in ADL and mobility. Pt currently supervision for ADL and min guard for mobility with SPC. Pt able to dress fully, perform toilet transfer and sink level grooming with no assist needed. Education complete. No questions or concerns from Pt or son for OT. No further OT needs, thank you for this referral. OT to sign off.     Follow Up Recommendations  No OT follow up    Equipment Recommendations  None recommended by OT    Recommendations for Other Services       Precautions / Restrictions Precautions Precautions: Fall;Other (comment) Restrictions Weight Bearing Restrictions: No      Mobility Bed Mobility Overal bed mobility: Modified Independent                Transfers Overall transfer level: Needs assistance Equipment used: None;Straight cane Transfers: Sit to/from Stand Sit to Stand: Min guard         General transfer comment: min guard for safety    Balance Overall balance assessment: Needs assistance Sitting-balance support: No upper extremity supported;Feet supported Sitting balance-Leahy Scale: Good     Standing balance support: During functional activity;No upper extremity supported Standing balance-Leahy Scale: Fair Standing balance comment: static is fair, dynamic is poor with close min guard                           ADL either performed or assessed with clinical judgement   ADL Overall ADL's : Needs assistance/impaired                                       General ADL Comments: Pt is supervision for any mobility, but had no LOB during sink level grooming/oral  care, LB and UE dressing     Vision Baseline Vision/History: Wears glasses Wears Glasses: At all times Patient Visual Report: No change from baseline Vision Assessment?: No apparent visual deficits     Perception     Praxis      Pertinent Vitals/Pain Pain Assessment: No/denies pain     Hand Dominance Right   Extremity/Trunk Assessment Upper Extremity Assessment Upper Extremity Assessment: Overall WFL for tasks assessed   Lower Extremity Assessment Lower Extremity Assessment: Defer to PT evaluation   Cervical / Trunk Assessment Cervical / Trunk Assessment: Normal   Communication Communication Communication: No difficulties   Cognition Arousal/Alertness: Awake/alert Behavior During Therapy: WFL for tasks assessed/performed Overall Cognitive Status: Within Functional Limits for tasks assessed                                     General Comments  [name] present for session    Exercises     Shoulder Instructions      Home Living Family/patient expects to be discharged to:: Private residence Living Arrangements: Spouse/significant other Available Help at Discharge: Family;Available 24 hours/day Type of Home: House Home Access: Stairs to enter [employer] of Steps: 1 Entrance Stairs-Rails:  None Home Layout: One level     Bathroom Shower/Tub: Occupational psychologist: Standard Bathroom Accessibility: Yes How Accessible: Accessible via wheelchair Home Equipment: [place] - 2 wheels;Cane - single point;Shower seat;Shower seat - built in   Additional Comments: Bathroom is entirely wheelchair accessible      Prior Functioning/Environment Level of Independence: Independent                 OT Problem List: Impaired balance (sitting and/or standing)      OT Treatment/Interventions:      OT Goals(Current goals can be found in the care plan section) Acute Rehab OT Goals Patient Stated Goal: return home OT Goal  Formulation: With patient/family Time For Goal Achievement: 03/22/17 Potential to Achieve Goals: Good  OT Frequency:     Barriers to D/C:            Co-evaluation              AM-PAC PT "6 Clicks" Daily Activity     Outcome Measure Help from another person eating meals?: None Help from another person taking care of personal grooming?: None Help from another person toileting, which includes using toliet, bedpan, or urinal?: A Little Help from another person bathing (including washing, rinsing, drying)?: A Little Help from another person to put on and taking off regular upper body clothing?: None Help from another person to put on and taking off regular lower body clothing?: None 6 Click Score: 22   End of Session Equipment Utilized During Treatment: Gait belt;Other (comment) [facility]) Nurse Communication: Mobility status  Activity Tolerance: Patient tolerated treatment well Patient left: in chair;with call bell/phone within reach;with family/visitor present  OT Visit Diagnosis: Unsteadiness on feet (R26.81)                Time: 6415-8309 OT Time Calculation (min): 21 min Charges:  OT General Charges $OT Visit: 1 Procedure OT Evaluation $OT Eval Low Complexity: 1 Procedure G-Codes:     Hulda Humphrey OTR/L [place] 03/15/2017, 1:38 PM

## 2017-03-15 NOTE — Evaluation (Signed)
Clinical/Bedside Swallow Evaluation Patient Details  Name: Danny Krause MRN: [id number] Date of Birth: [birth year]  Today's Date: 03/15/2017 Time: SLP Start Time (ACUTE ONLY): 1000 SLP Stop Time (ACUTE ONLY): 1020 SLP Time Calculation (min) (ACUTE ONLY): 20 min  Past Medical History: History reviewed. No pertinent past medical history. Past Surgical History: History reviewed. No pertinent surgical history. HPI:  Danny Krause a 76 y.o.malewith no significant medical history, hasnt been to doctor in 35 years. Patient brought in to ED by EMS after his wife found him down after he was LKW at 2pm cutting trees. Abrasions to elbows, EMS reports unresponsive initially with R sided gaze. Has slowly improved in ED, CT head neg, MRI brain wasn't able to be performed initially due to patient not being able to hold still. Patient has slowly recovered. EEG done in ED results pending. Now more alert, oriented, and just passed swallow screen.   Assessment / Plan / Recommendation Clinical Impression  Pt presents with reduced risk for aspiraiton ofllowing general aspiraiton precuations on regular diet with thin liquids and medicine whole with water. SLP Visit Diagnosis: Dysphagia, oropharyngeal phase (R13.12)    Aspiration Risk  No limitations    Diet Recommendation Regular;Thin liquid   Liquid Administration via: Spoon;Cup;Straw Medication Administration: Whole meds with liquid Supervision: Patient able to self feed Compensations: Minimize environmental distractions;Slow rate;Small sips/bites Postural Changes: Seated upright at 90 degrees    Other  Recommendations Oral Care Recommendations: Oral care BID   Follow up Recommendations None        Swallow Study   General Date of Onset: 03/13/17 HPI: Danny Krause a 76 y.o.malewith no significant medical history, hasnt been to doctor in 35 years. Patient brought in to ED by EMS after his wife found him down after he was LKW at  2pm cutting trees. Abrasions to elbows, EMS reports unresponsive initially with R sided gaze. Has slowly improved in ED, CT head neg, MRI brain wasn't able to be performed initially due to patient not being able to hold still. Patient has slowly recovered. EEG done in ED results pending. Now more alert, oriented, and just passed swallow screen. Type of Study: Bedside Swallow Evaluation Previous Swallow Assessment: none in chart Diet Prior to this Study: Regular;Thin liquids Temperature Spikes Noted: No Respiratory Status: Room air History of Recent Intubation: No Behavior/Cognition: Alert;Cooperative;Pleasant mood Oral Cavity Assessment: Within Functional Limits Oral Care Completed by SLP: No Oral Cavity - Dentition: Edentulous Vision: Functional for self-feeding Self-Feeding Abilities: Able to feed self Patient Positioning: Upright in bed Baseline Vocal Quality: Normal Volitional Cough: Strong Volitional Swallow: Able to elicit    Oral/Motor/Sensory Function Overall Oral Motor/Sensory Function: Within functional limits   Ice Chips Ice chips: Within functional limits   Thin Liquid Thin Liquid: Within functional limits Presentation: Cup;Self Fed;Straw    Nectar Thick Nectar Thick Liquid: Not tested   Honey Thick Honey Thick Liquid: Not tested   Puree Puree: Within functional limits Presentation: Self Fed;Spoon   Solid   GO   Solid: Within functional limits Presentation: Self Fed;Spoon       Felesia Stahlecker B. Rutherford Nail M.S., [place] 03/15/2017,10:52 AM

## 2017-03-16 DIAGNOSIS — R569 Unspecified convulsions: Secondary | ICD-10-CM | POA: Diagnosis not present

## 2017-03-16 DIAGNOSIS — M6281 Muscle weakness (generalized): Secondary | ICD-10-CM | POA: Diagnosis not present

## 2017-03-16 DIAGNOSIS — Z720 Tobacco use: Secondary | ICD-10-CM | POA: Diagnosis not present

## 2017-03-16 DIAGNOSIS — Z0000 Encounter for general adult medical examination without abnormal findings: Secondary | ICD-10-CM | POA: Diagnosis not present

## 2017-03-16 DIAGNOSIS — R7303 Prediabetes: Secondary | ICD-10-CM | POA: Diagnosis not present

## 2017-03-18 DIAGNOSIS — R569 Unspecified convulsions: Secondary | ICD-10-CM | POA: Diagnosis not present

## 2017-03-18 DIAGNOSIS — M6281 Muscle weakness (generalized): Secondary | ICD-10-CM | POA: Diagnosis not present

## 2017-03-22 DIAGNOSIS — R569 Unspecified convulsions: Secondary | ICD-10-CM | POA: Diagnosis not present

## 2017-03-22 DIAGNOSIS — M6281 Muscle weakness (generalized): Secondary | ICD-10-CM | POA: Diagnosis not present

## 2017-03-22 DIAGNOSIS — F17210 Nicotine dependence, cigarettes, uncomplicated: Secondary | ICD-10-CM | POA: Diagnosis not present

## 2017-03-22 DIAGNOSIS — R2689 Other abnormalities of gait and mobility: Secondary | ICD-10-CM | POA: Diagnosis not present

## 2017-03-22 DIAGNOSIS — W19XXXD Unspecified fall, subsequent encounter: Secondary | ICD-10-CM | POA: Diagnosis not present

## 2017-03-22 DIAGNOSIS — R7303 Prediabetes: Secondary | ICD-10-CM | POA: Diagnosis not present

## 2017-03-26 DIAGNOSIS — R7303 Prediabetes: Secondary | ICD-10-CM | POA: Diagnosis not present

## 2017-03-26 DIAGNOSIS — R2689 Other abnormalities of gait and mobility: Secondary | ICD-10-CM | POA: Diagnosis not present

## 2017-03-26 DIAGNOSIS — W19XXXD Unspecified fall, subsequent encounter: Secondary | ICD-10-CM | POA: Diagnosis not present

## 2017-03-26 DIAGNOSIS — M6281 Muscle weakness (generalized): Secondary | ICD-10-CM | POA: Diagnosis not present

## 2017-03-26 DIAGNOSIS — R569 Unspecified convulsions: Secondary | ICD-10-CM | POA: Diagnosis not present

## 2017-03-26 DIAGNOSIS — F17210 Nicotine dependence, cigarettes, uncomplicated: Secondary | ICD-10-CM | POA: Diagnosis not present

## 2017-03-29 DIAGNOSIS — M6281 Muscle weakness (generalized): Secondary | ICD-10-CM | POA: Diagnosis not present

## 2017-03-29 DIAGNOSIS — W19XXXD Unspecified fall, subsequent encounter: Secondary | ICD-10-CM | POA: Diagnosis not present

## 2017-03-29 DIAGNOSIS — R2689 Other abnormalities of gait and mobility: Secondary | ICD-10-CM | POA: Diagnosis not present

## 2017-03-29 DIAGNOSIS — F17210 Nicotine dependence, cigarettes, uncomplicated: Secondary | ICD-10-CM | POA: Diagnosis not present

## 2017-03-29 DIAGNOSIS — R7303 Prediabetes: Secondary | ICD-10-CM | POA: Diagnosis not present

## 2017-03-29 DIAGNOSIS — R569 Unspecified convulsions: Secondary | ICD-10-CM | POA: Diagnosis not present

## 2017-04-04 DIAGNOSIS — R011 Cardiac murmur, unspecified: Secondary | ICD-10-CM | POA: Diagnosis not present

## 2017-04-04 DIAGNOSIS — E7800 Pure hypercholesterolemia, unspecified: Secondary | ICD-10-CM | POA: Diagnosis not present

## 2017-04-04 DIAGNOSIS — E7439 Other disorders of intestinal carbohydrate absorption: Secondary | ICD-10-CM | POA: Diagnosis not present

## 2017-04-04 DIAGNOSIS — R569 Unspecified convulsions: Secondary | ICD-10-CM | POA: Diagnosis not present

## 2017-04-05 ENCOUNTER — Telehealth: Payer: Self-pay | Admitting: Internal Medicine

## 2017-04-05 DIAGNOSIS — R7303 Prediabetes: Secondary | ICD-10-CM | POA: Diagnosis not present

## 2017-04-05 DIAGNOSIS — R569 Unspecified convulsions: Secondary | ICD-10-CM | POA: Diagnosis not present

## 2017-04-05 DIAGNOSIS — M6281 Muscle weakness (generalized): Secondary | ICD-10-CM | POA: Diagnosis not present

## 2017-04-05 DIAGNOSIS — F17210 Nicotine dependence, cigarettes, uncomplicated: Secondary | ICD-10-CM | POA: Diagnosis not present

## 2017-04-05 DIAGNOSIS — R2689 Other abnormalities of gait and mobility: Secondary | ICD-10-CM | POA: Diagnosis not present

## 2017-04-05 DIAGNOSIS — W19XXXD Unspecified fall, subsequent encounter: Secondary | ICD-10-CM | POA: Diagnosis not present

## 2017-04-05 NOTE — Telephone Encounter (Signed)
Received records from [facility] for appointment on 05/08/17 with Dr Debara Pickett.  Records put with Dr Lysbeth Penner schedule for 05/08/17. lp

## 2017-04-11 DIAGNOSIS — R569 Unspecified convulsions: Secondary | ICD-10-CM | POA: Diagnosis not present

## 2017-04-11 DIAGNOSIS — F17210 Nicotine dependence, cigarettes, uncomplicated: Secondary | ICD-10-CM | POA: Diagnosis not present

## 2017-04-11 DIAGNOSIS — R7303 Prediabetes: Secondary | ICD-10-CM | POA: Diagnosis not present

## 2017-04-11 DIAGNOSIS — M6281 Muscle weakness (generalized): Secondary | ICD-10-CM | POA: Diagnosis not present

## 2017-04-11 DIAGNOSIS — W19XXXD Unspecified fall, subsequent encounter: Secondary | ICD-10-CM | POA: Diagnosis not present

## 2017-04-11 DIAGNOSIS — R2689 Other abnormalities of gait and mobility: Secondary | ICD-10-CM | POA: Diagnosis not present

## 2017-05-08 ENCOUNTER — Encounter: Payer: Self-pay | Admitting: Internal Medicine

## 2017-05-08 ENCOUNTER — Ambulatory Visit (INDEPENDENT_AMBULATORY_CARE_PROVIDER_SITE_OTHER): Payer: Medicare Other | Admitting: Internal Medicine

## 2017-05-08 VITALS — BP 128/76 | HR 77 | Ht 67.0 in | Wt 151.8 lb

## 2017-05-08 DIAGNOSIS — R400 Somnolence: Secondary | ICD-10-CM | POA: Diagnosis not present

## 2017-05-08 DIAGNOSIS — R569 Unspecified convulsions: Secondary | ICD-10-CM

## 2017-05-08 DIAGNOSIS — R011 Cardiac murmur, unspecified: Secondary | ICD-10-CM | POA: Diagnosis not present

## 2017-05-08 NOTE — Patient Instructions (Addendum)
Your physician has requested that you have an echocardiogram @ [address] - [address]rd [address]. Echocardiography is a painless test that uses sound waves to create images of your heart. It provides your doctor with information about the size and shape of your heart and how well your heart's chambers and valves are working. This procedure takes approximately one hour. There are no restrictions for this procedure.  Your physician recommends that you schedule a follow-up appointment with Dr. Debara Pickett after your echocardiogram

## 2017-05-08 NOTE — Progress Notes (Signed)
OFFICE CONSULT NOTE  Chief Complaint: Syncope, murmur  Primary Care Physician: Danny Gravel, MD  HPI:  Danny Krause is a 76 y.o. male who is being seen today for the evaluation of murmur at the request of Dr. Jani Krause. Danny Krause is a pleasant 76 yo male whose wife is a patient of mine. Recently had a syncopal episode. He was doing some work outside in [employer]. He apparently passed out although this was unwitnessed. He was found down and thought possibly had a stroke. When EMS arrived they were planning to take him to the hospital and then he had a seizure. He apparently had a status post episode and eventually was broken of that seizure. He was placed on Keppra and has a follow-up with the neurologist next month. He denies any chest pain but does get short of breath. He is a 50 pack year smoker and continues to smoke. He reports when he was in his 89s he had workup for chest pain which included an evaluation that demonstrated a heart murmur. He was told that he should take antibiotics prior to dental visits but nothing more was made out of it. He did not follow-up with a doctor since then. He's not a fan of doctors, although we have significantly helped his wife.  PMHx:  Past Medical History:  Diagnosis Date  . Murmur   . Seizure ([place])   . Syncope     History reviewed. No pertinent surgical history.  FAMHx:  Family History  Problem Relation Age of Onset  . Hernia Father   . Stroke Brother     SOCHx:   reports that he has been smoking.  He has been smoking about 2.00 packs per day. He has never used smokeless tobacco. He reports that he does not drink alcohol or use drugs.  ALLERGIES:  No Known Allergies  ROS: Pertinent items noted in HPI and remainder of comprehensive ROS otherwise negative.  HOME MEDS: Current Outpatient Prescriptions on File Prior to Visit  Medication Sig Dispense Refill  . levETIRAcetam (KEPPRA) 500 MG tablet Take 1 tablet (500 mg total) by  mouth 2 (two) times daily. 60 tablet 4  . vitamin B-12 1000 MCG tablet Take 2 tablets (2,000 mcg total) by mouth daily. 60 tablet 3   No current facility-administered medications on file prior to visit.     LABS/IMAGING: No results found for this or any previous visit (from the past 48 hour(s)). No results found.  LIPID PANEL:    Component Value Date/Time   CHOL 196 03/13/2017 2338   TRIG 74 03/13/2017 2338   HDL 47 03/13/2017 2338   CHOLHDL 4.2 03/13/2017 2338   VLDL 15 03/13/2017 2338   LDLCALC 134 (H) 03/13/2017 2338    WEIGHTS: Wt Readings from Last 3 Encounters:  05/08/17 151 lb 12.8 oz (68.9 kg)  03/15/17 167 lb (75.8 kg)    VITALS: BP 128/76   Pulse 77   Ht 5\' 7"  (1.702 m)   Wt 151 lb 12.8 oz (68.9 kg)   BMI 23.78 kg/m   EXAM: General appearance: alert and no distress Neck: no carotid bruit, no JVD and thyroid not enlarged, symmetric, no tenderness/mass/nodules Lungs: diminished breath sounds bibasilar Heart: regular rate and rhythm, S1, S2 normal and systolic murmur: midsystolic 3/6, crescendo at 2nd right intercostal space Abdomen: soft, non-tender; bowel sounds normal; no masses,  no organomegaly Extremities: extremities normal, atraumatic, no cyanosis or edema Pulses: 2+ and symmetric Skin: Skin color, texture, turgor  normal. No rashes or lesions Neurologic: Mental status: Alert, oriented, thought content appropriate Psych: pleasant  EKG: Sinus rhythm with first-degree AV block, pulmonary disease pattern, incomplete right bundle branch block at 77 - personally reviewed  ASSESSMENT: 1. Systolic ejection murmur-likely aortic stenosis 2. Dyspnea-likely secondary to COPD 3. Abnormal EKG suggesting pulmonary disease pattern 4. Recent syncopal episode 5. Seizure  PLAN: 1.   [name] had a recent episode of syncope with seizure. Is not clear whether a seizure preceded the initial episode or if there was syncope with head injury and seizure although  head imaging was negative. He's been on anticonvulsants and says recently he's had trouble thinking and feels more confused. He likely has some COPD and reports dyspnea on exertion but denies any chest pain. He has an ejection murmur which sounds acute aortic stenosis. With concerning is that he had an abnormal murmur detected about 50 years ago and this was never evaluated. I'm concerned that he may have a bicuspid aortic valve and perhaps aortic stenosis related to that. On exam it sounds moderate to severe today. Will obtain an echo to further evaluate it. He has follow-up with the neurologist in October regarding seizure. Follow-up with me afterwards to discuss his echo.  Thanks for the consultation.  Pixie Casino, MD, [place]  Attending Cardiologist  Direct Dial: [phone]  Fax: [phone]  Website:  [email or web address] 05/08/2017, 12:49 PM

## 2017-05-15 ENCOUNTER — Other Ambulatory Visit: Payer: Self-pay

## 2017-05-15 ENCOUNTER — Ambulatory Visit (HOSPITAL_COMMUNITY): Payer: Medicare Other | Attending: Cardiology

## 2017-05-15 DIAGNOSIS — R011 Cardiac murmur, unspecified: Secondary | ICD-10-CM | POA: Diagnosis not present

## 2017-05-15 DIAGNOSIS — I350 Nonrheumatic aortic (valve) stenosis: Secondary | ICD-10-CM | POA: Insufficient documentation

## 2017-05-16 DIAGNOSIS — E7800 Pure hypercholesterolemia, unspecified: Secondary | ICD-10-CM | POA: Diagnosis not present

## 2017-05-16 DIAGNOSIS — E7439 Other disorders of intestinal carbohydrate absorption: Secondary | ICD-10-CM | POA: Diagnosis not present

## 2017-05-23 DIAGNOSIS — E7800 Pure hypercholesterolemia, unspecified: Secondary | ICD-10-CM | POA: Diagnosis not present

## 2017-05-23 DIAGNOSIS — R569 Unspecified convulsions: Secondary | ICD-10-CM | POA: Diagnosis not present

## 2017-05-23 DIAGNOSIS — E7439 Other disorders of intestinal carbohydrate absorption: Secondary | ICD-10-CM | POA: Diagnosis not present

## 2017-05-23 DIAGNOSIS — E538 Deficiency of other specified B group vitamins: Secondary | ICD-10-CM | POA: Diagnosis not present

## 2017-06-01 ENCOUNTER — Encounter: Payer: Self-pay | Admitting: Internal Medicine

## 2017-06-01 ENCOUNTER — Ambulatory Visit (INDEPENDENT_AMBULATORY_CARE_PROVIDER_SITE_OTHER): Payer: Medicare Other | Admitting: Internal Medicine

## 2017-06-01 VITALS — BP 128/82 | HR 69 | Ht 67.0 in | Wt 150.8 lb

## 2017-06-01 DIAGNOSIS — R55 Syncope and collapse: Secondary | ICD-10-CM | POA: Insufficient documentation

## 2017-06-01 DIAGNOSIS — I350 Nonrheumatic aortic (valve) stenosis: Secondary | ICD-10-CM | POA: Insufficient documentation

## 2017-06-01 NOTE — Patient Instructions (Addendum)
Your physician recommends that you schedule a follow-up appointment in:1 month Friday 07/06/17 at 3:45 pm.    Call Dr.Hilty's RN Eliezer Lofts next week to schedule Heart Cath  [phone].

## 2017-06-01 NOTE — Progress Notes (Signed)
OFFICE CONSULT NOTE  Chief Complaint: Follow-up echo  Primary Care Physician: Jani Gravel, MD  HPI:  Danny Krause is a 76 y.o. male who is being seen today for the evaluation of murmur at the request of Dr. Jani Gravel. Danny Krause is a pleasant 76 yo male whose wife is a patient of mine. Recently had a syncopal episode. He was doing some work outside in [employer]. He apparently passed out although this was unwitnessed. He was found down and thought possibly had a stroke. When EMS arrived they were planning to take him to the hospital and then he had a seizure. He apparently had a status post episode and eventually was broken of that seizure. He was placed on Keppra and has a follow-up with the neurologist next month. He denies any chest pain but does get short of breath. He is a 50 pack year smoker and continues to smoke. He reports when he was in his 38s he had workup for chest pain which included an evaluation that demonstrated a heart murmur. He was told that he should take antibiotics prior to dental visits but nothing more was made out of it. He did not follow-up with a doctor since then. He's not a fan of doctors, although we have significantly helped his wife.  06/01/2017  Danny Krause returns today for follow-up. He reports his wife is actually back in the hospital now. He seems to be under a lot of stress. Unfortunately the echo demonstrated severe aortic stenosis. This likely the cause of his syncopal episode and seizure. Mean gradient across the aortic valve is 87 mmHg with a calculated aortic valve area 0.4 cm. LVEF is 55-60% with normal wall motion and grade 1 diastolic dysfunction. I discussed this with him today and he seemed quite upset with the news. He understands that he will need aortic valve surgery. Given his lack of other comorbidities, I think EF she would be a good candidate for traditional aortic valve replacement, likely with a bioprosthetic valve. He will need a heart  catheterization prior to this. Right now since his wife's in the hospital, he wants to wait to schedule it until next week.  PMHx:  Past Medical History:  Diagnosis Date  . Murmur   . Seizure ([place])   . Syncope     No past surgical history on file.  FAMHx:  Family History  Problem Relation Age of Onset  . Hernia Father   . Stroke Brother     SOCHx:   reports that he has been smoking.  He has been smoking about 2.00 packs per day. He has never used smokeless tobacco. He reports that he does not drink alcohol or use drugs.  ALLERGIES:  No Known Allergies  ROS: Pertinent items noted in HPI and remainder of comprehensive ROS otherwise negative.  HOME MEDS: Current Outpatient Prescriptions on File Prior to Visit  Medication Sig Dispense Refill  . levETIRAcetam (KEPPRA) 500 MG tablet Take 1 tablet (500 mg total) by mouth 2 (two) times daily. 60 tablet 4  . vitamin B-12 1000 MCG tablet Take 2 tablets (2,000 mcg total) by mouth daily. 60 tablet 3   No current facility-administered medications on file prior to visit.     LABS/IMAGING: No results found for this or any previous visit (from the past 48 hour(s)). No results found.  LIPID PANEL:    Component Value Date/Time   CHOL 196 03/13/2017 2338   TRIG 74 03/13/2017 2338   HDL 47  03/13/2017 2338   CHOLHDL 4.2 03/13/2017 2338   VLDL 15 03/13/2017 2338   LDLCALC 134 (H) 03/13/2017 2338    WEIGHTS: Wt Readings from Last 3 Encounters:  06/01/17 150 lb 12.8 oz (68.4 kg)  05/08/17 151 lb 12.8 oz (68.9 kg)  03/15/17 167 lb (75.8 kg)    VITALS: BP 128/82   Pulse 69   Ht 5\' 7"  (1.702 m)   Wt 150 lb 12.8 oz (68.4 kg)   BMI 23.62 kg/m   EXAM: Deferred  EKG: Deferred  ASSESSMENT: 1. Critical aortic stenosis - AVA 0.4 cm2 (mean gradient 87 mmHg) 2. Dyspnea-likely secondary to COPD 3. Abnormal EKG suggesting pulmonary disease pattern 4. Recent syncopal episode 5. Seizure  PLAN: 1.   Danny Krause had a syncopal  episode likely related to critical aortic stenosis. He also likely had convulsive activity from cerebral hypoperfusion and this made out of represented seizure. I suspect he'll ultimately come off of his seizure medication. He will need left and right heart catheterization as soon as possible for preoperative workup and is likely a good candidate for traditional aortic valve replacement. He wishes to contact her office next week and try to arrange car to catheterization then. Follow-up with me in one month.  Danny Casino, MD, [place]  Attending Cardiologist  Direct Dial: [phone]  Fax: [phone]  Website:  [email or web address] 06/01/2017, 4:06 PM

## 2017-06-05 ENCOUNTER — Telehealth: Payer: Self-pay | Admitting: Internal Medicine

## 2017-06-05 NOTE — Telephone Encounter (Signed)
Contacted patient to see if he was ready to set up his R&L heart cath per MD note on 9/28. He states he has a neurologist appt on 10/4 and would like to call after this appointment to set up his heart cath.

## 2017-06-07 ENCOUNTER — Encounter: Payer: Self-pay | Admitting: Neurology

## 2017-06-07 ENCOUNTER — Ambulatory Visit (INDEPENDENT_AMBULATORY_CARE_PROVIDER_SITE_OTHER): Payer: Medicare Other | Admitting: Neurology

## 2017-06-07 VITALS — BP 138/70 | HR 77 | Wt 150.5 lb

## 2017-06-07 DIAGNOSIS — R569 Unspecified convulsions: Secondary | ICD-10-CM

## 2017-06-07 NOTE — Progress Notes (Signed)
Reason for visit: Seizures  Referring physician: Dr. Etta Quill Fester is a 76 y.o. male  History of present illness:  Mr. Danny Krause is a 76 year old white male with a history of an admission to the hospital on 03/13/2017 for seizures. The patient was working out in the yard on the day of admission, it was 98 outside. The patient was found on the ground unconscious, it appeared that he had fallen backwards and hit his head on cement. EMS was called, as he was being transported into the ambulance, he began having multiple seizures, the seizures continued off and on until he got to the hospital. The patient underwent MRI of the brain that showed no acute changes, mild small vessel disease was noted. He underwent an EEG study that was normal, but a 2-D echocardiogram showed severe aortic stenosis. The patient does not recall anything about the events leading up to the hospitalization. He will sometimes get dizzy with coughing, but he has never had a syncopal event before. The patient was placed on Keppra, he is tolerating the medication well, he has not had any recurrent seizures. He denies any numbness or weakness of the face, arms, or legs. He does have a chronic cough. He denies any headaches or dizziness. There have been no changes in speech or memory. The patient is sent to this office for further evaluation.  Past Medical History:  Diagnosis Date  . Murmur   . Seizure ([place])   . Syncope     Past Surgical History:  Procedure Laterality Date  . NO PAST SURGERIES      Family History  Problem Relation Age of Onset  . Hernia Father   . Stroke Brother     Social history:  reports that he has been smoking.  He has been smoking about 2.00 packs per day. He has never used smokeless tobacco. He reports that he does not drink alcohol or use drugs.  Medications:  Prior to Admission medications   Medication Sig Start Date End Date Taking? Authorizing Provider  levETIRAcetam (KEPPRA) 500  MG tablet Take 1 tablet (500 mg total) by mouth 2 (two) times daily. 03/15/17  Yes Reyne Dumas, MD  vitamin B-12 1000 MCG tablet Take 2 tablets (2,000 mcg total) by mouth daily. 03/16/17  Yes Reyne Dumas, MD     No Known Allergies  ROS:  Out of a complete 14 system review of symptoms, the patient complains only of the following symptoms, and all other reviewed systems are negative.  Fatigue Heart murmur Hearing loss Shortness of breath, cough, wheezing, snoring Easy bruising, easy bleeding Feeling cold Joint pain, muscle cramps, aching muscles Runny nose Memory loss, dizziness, seizure, passing out Anxiety, not enough sleep, decreased energy, change in appetite Insomnia, sleepiness, restless legs  Blood pressure 138/70, pulse 77, weight 150 lb 8 oz (68.3 kg), SpO2 92 %.  Physical Exam  General: The patient is alert and cooperative at the time of the examination.  Eyes: Pupils are equal, round, and reactive to light. Discs are flat bilaterally.  Neck: The neck is supple, radiation of the cardiac murmur is noted in both carotids.  Respiratory: The respiratory examination is clear.  Cardiovascular: The cardiovascular examination reveals a regular rate and rhythm, with a grade III/VI systolic ejection murmur at the aortic area.  Skin: Extremities are with 2+ edema below the knees bilaterally.  Neurologic Exam  Mental status: The patient is alert and oriented x 3 at the time of the examination.  The patient has apparent normal recent and remote memory, with an apparently normal attention span and concentration ability.  Cranial nerves: Facial symmetry is present. There is good sensation of the face to pinprick and soft touch bilaterally. The strength of the facial muscles and the muscles to head turning and shoulder shrug are normal bilaterally. Speech is well enunciated, no aphasia or dysarthria is noted. Extraocular movements are full. Visual fields are full. The tongue is  midline, and the patient has symmetric elevation of the soft palate. No obvious hearing deficits are noted.  Motor: The motor testing reveals 5 over 5 strength of all 4 extremities. Good symmetric motor tone is noted throughout.  Sensory: Sensory testing is intact to pinprick, soft touch, vibration sensation, and position sense on all 4 extremities. No evidence of extinction is noted.  Coordination: Cerebellar testing reveals good finger-nose-finger and heel-to-shin bilaterally.  Gait and station: Gait is normal. Tandem gait is normal. Romberg is negative. No drift is seen.  Reflexes: Deep tendon reflexes are symmetric and normal bilaterally. Toes are downgoing bilaterally.   MRI brain 03/14/17:  IMPRESSION: Motion degraded examination demonstrating no definite acute or focal intracranial abnormality.  Atrophy and small vessel disease. No visible abnormal postcontrast enhancement.  Small scalp hematoma near the vertex.  * MRI scan images were reviewed online. I agree with the written report.   EEG 03/13/17:  Clinical Interpretation: This normal EEG is recorded in the waking  state. There was no seizure or seizure predisposition recorded on this study. Please note that a normal EEG does not preclude the possibility of epilepsy.     Assessment/Plan:  1. Seizure event  2. Aortic stenosis, severe  The events that lead up to the seizures are not clear. The patient had a syncopal event and fell and hit his head, this could have induced seizures associated with a concussion. Unfortunately, the event was not witnessed. The patient is on Keppra, he is tolerating this well. We will keep the patient on the medication for now, we may consider a slow taper off the medication after a year if he continues to do well. He is not to drive until around 15/40/0867.  Jill Alexanders MD 06/07/2017 4:12 PM  [facility] [address] [place], [place]  [address]  Phone [phone] Fax [phone]

## 2017-06-08 NOTE — Telephone Encounter (Signed)
Contacted patient to see about setting up heart cath. He states he is still taking care of his wife and it will be next week before he can get this set up. Advised to call back and ask for myself or a triage nurse if I am unavailable to get this arranged.

## 2017-06-11 ENCOUNTER — Telehealth: Payer: Self-pay | Admitting: Internal Medicine

## 2017-06-11 DIAGNOSIS — Z01812 Encounter for preprocedural laboratory examination: Secondary | ICD-10-CM

## 2017-06-11 DIAGNOSIS — R5383 Other fatigue: Secondary | ICD-10-CM

## 2017-06-11 DIAGNOSIS — D689 Coagulation defect, unspecified: Secondary | ICD-10-CM

## 2017-06-11 DIAGNOSIS — I350 Nonrheumatic aortic (valve) stenosis: Secondary | ICD-10-CM

## 2017-06-11 NOTE — Telephone Encounter (Signed)
Another telephone note opened in reference to same issue/concern. This encounter will be closed.

## 2017-06-11 NOTE — Telephone Encounter (Signed)
Star[place],Danny Krause pt's son needs a call back about scheduling his lab and xray before CATH.

## 2017-06-11 NOTE — Telephone Encounter (Signed)
Returned call to patient's son. Explained that a CXR is not needed (had one w/in last 6 months) and he will only need lab work done within a 2 week time frame of the scheduled cath date. Explained that cath will need to scheduled first and then patient can come to our office to lab work (without an appt). He states he will talk with his dad (patient) and call back with available dates and then proceed w/scheduling. Informed him that if I am unavailable, he can leave the dates available w/operator and they will forward me the message. He voiced understanding.

## 2017-06-14 NOTE — Telephone Encounter (Signed)
Patient's son + patient are aware of cath appt date/time/location. Patient will come to [facility] office for blood work no later than 10/17 and will pick up cath instruction letter at this time. Letter composed and left at front desk.

## 2017-06-14 NOTE — Telephone Encounter (Signed)
@  [facility] OFFICE [address] [place] [address] Dept: [phone] Loc: [phone]  Danny Krause  06/14/2017  You are scheduled for a Cardiac Catheterization on Monday, October 22 with Dr. Shelva Majestic.  1. Please arrive at the [facility] (Main Entrance A) at [facility]: [address] [place], [place] [address] at 5:30 AM (two hours before your procedure to ensure your preparation). Free valet parking service is available.   Special note: Every effort is made to have your procedure done on time. Please understand that emergencies sometimes delay scheduled procedures.  2. Diet: Do not eat or drink anything after midnight prior to your procedure except sips of water to take medications.  3. Labs: You will need to have blood work done prior to your procedure   4. Medication instructions in preparation for your procedure:  On the morning of your procedure, take any morning medicines NOT listed above.  You may use sips of water.  5. Plan for one night stay--bring personal belongings. 6. Bring a current list of your medications and current insurance cards. 7. You MUST have a responsible person to drive you home. 8. Someone MUST be with you the first 24 hours after you arrive home or your discharge will be delayed. 9. Please wear clothes that are easy to get on and off and wear slip-on shoes.  Thank you for allowing [place] to care for you!   -- [facility]

## 2017-06-14 NOTE — Telephone Encounter (Signed)
Patient's son returned call. His father would like to have R&L heart cath on 10/22 early morning.

## 2017-06-18 DIAGNOSIS — D689 Coagulation defect, unspecified: Secondary | ICD-10-CM | POA: Diagnosis not present

## 2017-06-18 DIAGNOSIS — Z01812 Encounter for preprocedural laboratory examination: Secondary | ICD-10-CM | POA: Diagnosis not present

## 2017-06-18 DIAGNOSIS — R5383 Other fatigue: Secondary | ICD-10-CM | POA: Diagnosis not present

## 2017-06-19 ENCOUNTER — Other Ambulatory Visit: Payer: Self-pay | Admitting: *Deleted

## 2017-06-19 DIAGNOSIS — I350 Nonrheumatic aortic (valve) stenosis: Secondary | ICD-10-CM

## 2017-06-19 DIAGNOSIS — Z01810 Encounter for preprocedural cardiovascular examination: Secondary | ICD-10-CM

## 2017-06-19 LAB — BASIC METABOLIC PANEL
BUN / CREAT RATIO: 17 (ref 10–24)
BUN: 17 mg/dL (ref 8–27)
CHLORIDE: 103 mmol/L (ref 96–106)
CO2: 23 mmol/L (ref 20–29)
Calcium: 9.2 mg/dL (ref 8.6–10.2)
Creatinine, Ser: 1.03 mg/dL (ref 0.76–1.27)
GFR calc non Af Amer: 70 mL/min/{1.73_m2} (ref >59)
GFR, EST AFRICAN AMERICAN: 81 mL/min/{1.73_m2} (ref >59)
GLUCOSE: 101 mg/dL — AB (ref 65–99)
POTASSIUM: 5 mmol/L (ref 3.5–5.2)
SODIUM: 141 mmol/L (ref 134–144)

## 2017-06-19 LAB — PROTIME-INR
INR: 1 (ref 0.8–1.2)
Prothrombin Time: 10.4 s (ref 9.1–12.0)

## 2017-06-19 LAB — CBC
Hematocrit: 35.3 % — ABNORMAL LOW (ref 37.5–51.0)
Hemoglobin: 11.1 g/dL — ABNORMAL LOW (ref 13.0–17.7)
MCH: 25.7 pg — AB (ref 26.6–33.0)
MCHC: 31.4 g/dL — AB (ref 31.5–35.7)
MCV: 82 fL (ref 79–97)
PLATELETS: 329 10*3/uL (ref 150–379)
RBC: 4.32 x10E6/uL (ref 4.14–5.80)
RDW: 14.6 % (ref 12.3–15.4)
WBC: 8.2 10*3/uL (ref 3.4–10.8)

## 2017-06-19 LAB — APTT: aPTT: 45 s — ABNORMAL HIGH (ref 24–33)

## 2017-06-19 LAB — TSH: TSH: 3.31 u[IU]/mL (ref 0.450–4.500)

## 2017-06-21 ENCOUNTER — Telehealth: Payer: Self-pay

## 2017-06-21 NOTE — Telephone Encounter (Signed)
Patient contacted pre-catheterization at [facility] scheduled for:  06/25/2017 @ 0730 Verified arrival time and place:  [facility] @ 0530 Confirmed AM meds to be taken pre-cath with sip of water: Take ASA Confirmed patient has responsible person to drive home post procedure and observe patient for 24 hours:  yes Addl concerns:  Nervous, discussed process

## 2017-06-25 ENCOUNTER — Encounter (HOSPITAL_COMMUNITY): Payer: Self-pay | Admitting: Cardiovascular Disease

## 2017-06-25 ENCOUNTER — Ambulatory Visit (HOSPITAL_COMMUNITY)
Admission: RE | Disposition: A | Discharge status: Home / Self Care | Payer: Self-pay | Source: Ambulatory Visit | Attending: Cardiovascular Disease

## 2017-06-25 ENCOUNTER — Ambulatory Visit (HOSPITAL_COMMUNITY)
Admission: RE | Admit: 2017-06-25 | Discharge: 2017-06-25 | Disposition: A | Discharge status: Home / Self Care | Payer: Medicare Other | Source: Ambulatory Visit | Attending: Cardiovascular Disease | Admitting: Cardiovascular Disease

## 2017-06-25 DIAGNOSIS — I350 Nonrheumatic aortic (valve) stenosis: Secondary | ICD-10-CM | POA: Diagnosis not present

## 2017-06-25 DIAGNOSIS — I2510 Atherosclerotic heart disease of native coronary artery without angina pectoris: Secondary | ICD-10-CM | POA: Insufficient documentation

## 2017-06-25 DIAGNOSIS — R569 Unspecified convulsions: Secondary | ICD-10-CM | POA: Diagnosis not present

## 2017-06-25 DIAGNOSIS — F17210 Nicotine dependence, cigarettes, uncomplicated: Secondary | ICD-10-CM | POA: Insufficient documentation

## 2017-06-25 DIAGNOSIS — Z01810 Encounter for preprocedural cardiovascular examination: Secondary | ICD-10-CM

## 2017-06-25 HISTORY — PX: THORACIC AORTOGRAM: CATH118269

## 2017-06-25 HISTORY — PX: RIGHT/LEFT HEART CATH AND CORONARY ANGIOGRAPHY: CATH118266

## 2017-06-25 LAB — POCT I-STAT 3, VENOUS BLOOD GAS (G3P V)
BICARBONATE: 25.9 mmol/L (ref 20.0–28.0)
O2 Saturation: 62 %
PH VEN: 7.336 (ref 7.250–7.430)
PO2 VEN: 35 mmHg (ref 32.0–45.0)
TCO2: 27 mmol/L (ref 22–32)
pCO2, Ven: 48.4 mmHg (ref 44.0–60.0)

## 2017-06-25 LAB — POCT I-STAT 3, ART BLOOD GAS (G3+)
Acid-base deficit: 1 mmol/L (ref 0.0–2.0)
Bicarbonate: 24.7 mmol/L (ref 20.0–28.0)
O2 SAT: 98 %
PCO2 ART: 42 mmHg (ref 32.0–48.0)
PH ART: 7.377 (ref 7.350–7.450)
PO2 ART: 105 mmHg (ref 83.0–108.0)
TCO2: 26 mmol/L (ref 22–32)

## 2017-06-25 SURGERY — RIGHT/LEFT HEART CATH AND CORONARY ANGIOGRAPHY
Anesthesia: LOCAL

## 2017-06-25 MED ORDER — MIDAZOLAM HCL 2 MG/2ML IJ SOLN
INTRAMUSCULAR | Status: DC | PRN
  Administered 2017-06-25: 1 mg via INTRAVENOUS

## 2017-06-25 MED ORDER — IOPAMIDOL (ISOVUE-370) INJECTION 76%
INTRAVENOUS | Status: AC
  Filled 2017-06-25: qty 100

## 2017-06-25 MED ORDER — MIDAZOLAM HCL 2 MG/2ML IJ SOLN
INTRAMUSCULAR | Status: AC
  Filled 2017-06-25: qty 2

## 2017-06-25 MED ORDER — ONDANSETRON HCL 4 MG/2ML IJ SOLN: 4.0000 mg | Freq: Four times a day (QID) | INTRAMUSCULAR | Status: DC | PRN

## 2017-06-25 MED ORDER — SODIUM CHLORIDE 0.9 % IV SOLN
250.0000 mL | INTRAVENOUS | Status: DC | PRN
Start: 2017-06-25 — End: 2017-06-25

## 2017-06-25 MED ORDER — IOPAMIDOL (ISOVUE-370) INJECTION 76%
INTRAVENOUS | Status: AC
  Filled 2017-06-25: qty 50

## 2017-06-25 MED ORDER — SODIUM CHLORIDE 0.9% FLUSH: 3.0000 mL | INTRAVENOUS | Status: DC | PRN

## 2017-06-25 MED ORDER — FENTANYL CITRATE (PF) 100 MCG/2ML IJ SOLN
INTRAMUSCULAR | Status: AC
  Filled 2017-06-25: qty 2

## 2017-06-25 MED ORDER — HEPARIN (PORCINE) IN NACL 2-0.9 UNIT/ML-% IJ SOLN
INTRAMUSCULAR | Status: AC | PRN
  Administered 2017-06-25: 1000 mL

## 2017-06-25 MED ORDER — HEPARIN (PORCINE) IN NACL 2-0.9 UNIT/ML-% IJ SOLN
INTRAMUSCULAR | Status: AC
  Filled 2017-06-25: qty 1000

## 2017-06-25 MED ORDER — LIDOCAINE HCL 2 % IJ SOLN
INTRAMUSCULAR | Status: DC | PRN
  Administered 2017-06-25: 18 mL

## 2017-06-25 MED ORDER — ASPIRIN 81 MG PO CHEW: 81.0000 mg | CHEWABLE_TABLET | ORAL | Status: DC

## 2017-06-25 MED ORDER — ACETAMINOPHEN 325 MG PO TABS: 650.0000 mg | ORAL_TABLET | ORAL | Status: DC | PRN

## 2017-06-25 MED ORDER — SODIUM CHLORIDE 0.9 % IV SOLN: INTRAVENOUS | Status: DC

## 2017-06-25 MED ORDER — LIDOCAINE HCL 2 % IJ SOLN
INTRAMUSCULAR | Status: AC
  Filled 2017-06-25: qty 10

## 2017-06-25 MED ORDER — FENTANYL CITRATE (PF) 100 MCG/2ML IJ SOLN
INTRAMUSCULAR | Status: DC | PRN
  Administered 2017-06-25: 25 ug via INTRAVENOUS

## 2017-06-25 MED ORDER — ASPIRIN 81 MG PO CHEW: 81.0000 mg | CHEWABLE_TABLET | Freq: Every day | ORAL | Status: DC

## 2017-06-25 MED ORDER — VERAPAMIL HCL 2.5 MG/ML IV SOLN
INTRAVENOUS | Status: AC
  Filled 2017-06-25: qty 2

## 2017-06-25 MED ORDER — SODIUM CHLORIDE 0.9 % IV SOLN
INTRAVENOUS | Status: DC
  Administered 2017-06-25: 06:00:00 via INTRAVENOUS

## 2017-06-25 MED ORDER — SODIUM CHLORIDE 0.9% FLUSH: 3.0000 mL | Freq: Two times a day (BID) | INTRAVENOUS | Status: DC

## 2017-06-25 MED ORDER — HEPARIN SODIUM (PORCINE) 1000 UNIT/ML IJ SOLN
INTRAMUSCULAR | Status: AC
  Filled 2017-06-25: qty 1

## 2017-06-25 MED ORDER — IOPAMIDOL (ISOVUE-370) INJECTION 76%
INTRAVENOUS | Status: DC | PRN
  Administered 2017-06-25: 100 mL via INTRA_ARTERIAL

## 2017-06-25 SURGICAL SUPPLY — 14 items
CATH INFINITI 5FR JL5 (CATHETERS) ×3 IMPLANT
CATH INFINITI 5FR MULTPACK ANG (CATHETERS) ×3 IMPLANT
CATH SWAN GANZ 7F STRAIGHT (CATHETERS) ×3 IMPLANT
GUIDEWIRE .025 260CM (WIRE) ×3 IMPLANT
KIT HEART LEFT (KITS) ×3 IMPLANT
PACK CARDIAC CATHETERIZATION (CUSTOM PROCEDURE TRAY) ×3 IMPLANT
SHEATH PINNACLE 5F 10CM (SHEATH) ×3 IMPLANT
SHEATH PINNACLE 7F 10CM (SHEATH) ×3 IMPLANT
SYR MEDRAD MARK V 150ML (SYRINGE) ×6 IMPLANT
TRANSDUCER W/STOPCOCK (MISCELLANEOUS) ×3 IMPLANT
TUBING CIL FLEX 10 FLL-RA (TUBING) ×3 IMPLANT
WIRE EMERALD 3MM-J .035X150CM (WIRE) ×3 IMPLANT
WIRE EMERALD 3MM-J .035X260CM (WIRE) ×3 IMPLANT
WIRE EMERALD ST .035X150CM (WIRE) ×3 IMPLANT

## 2017-06-25 NOTE — Progress Notes (Signed)
Site area:  Rt groin fa sheath Site Prior to Removal:  Level 0 Pressure Applied For:  20 minutes Manual:    yes Patient Status During Pull:  stable Post Pull Site:  Level  0 Post Pull Instructions Given:  yes Post Pull Pulses Present: palpable Dressing Applied:  Gauze and tegaderm Bedrest begins @ 0920 Comments:

## 2017-06-25 NOTE — Interval H&P Note (Signed)
History and Physical Interval Note:  06/25/2017 7:32 AM  Danny Krause  has presented today for surgery, with the diagnosis of critical stenosis valve stenosis (pre-tavr)  The various methods of treatment have been discussed with the patient and family. After consideration of risks, benefits and other options for treatment, the patient has consented to  Procedure(s): RIGHT/LEFT HEART CATH AND CORONARY ANGIOGRAPHY (N/A) as a surgical intervention .  The patient's history has been reviewed, patient examined, no change in status, stable for surgery.  I have reviewed the patient's chart and labs.  Questions were answered to the patient's satisfaction.     Shelva Majestic

## 2017-06-25 NOTE — H&P (View-Only) (Signed)
OFFICE CONSULT NOTE  Chief Complaint: Follow-up echo  Primary Care Physician: Jani Gravel, MD  HPI:  Danny Krause is a 76 y.o. male who is being seen today for the evaluation of murmur at the request of Dr. Jani Gravel. Danny Krause is a pleasant 76 yo male whose wife is a patient of mine. Recently had a syncopal episode. He was doing some work outside in [employer]. He apparently passed out although this was unwitnessed. He was found down and thought possibly had a stroke. When EMS arrived they were planning to take him to the hospital and then he had a seizure. He apparently had a status post episode and eventually was broken of that seizure. He was placed on Keppra and has a follow-up with the neurologist next month. He denies any chest pain but does get short of breath. He is a 50 pack year smoker and continues to smoke. He reports when he was in his 35s he had workup for chest pain which included an evaluation that demonstrated a heart murmur. He was told that he should take antibiotics prior to dental visits but nothing more was made out of it. He did not follow-up with a doctor since then. He's not a fan of doctors, although we have significantly helped his wife.  06/01/2017  Danny Krause returns today for follow-up. He reports his wife is actually back in the hospital now. He seems to be under a lot of stress. Unfortunately the echo demonstrated severe aortic stenosis. This likely the cause of his syncopal episode and seizure. Mean gradient across the aortic valve is 87 mmHg with a calculated aortic valve area 0.4 cm. LVEF is 55-60% with normal wall motion and grade 1 diastolic dysfunction. I discussed this with him today and he seemed quite upset with the news. He understands that he will need aortic valve surgery. Given his lack of other comorbidities, I think EF she would be a good candidate for traditional aortic valve replacement, likely with a bioprosthetic valve. He will need a heart  catheterization prior to this. Right now since his wife's in the hospital, he wants to wait to schedule it until next week.  PMHx:  Past Medical History:  Diagnosis Date  . Murmur   . Seizure ([place])   . Syncope     No past surgical history on file.  FAMHx:  Family History  Problem Relation Age of Onset  . Hernia Father   . Stroke Brother     SOCHx:   reports that he has been smoking.  He has been smoking about 2.00 packs per day. He has never used smokeless tobacco. He reports that he does not drink alcohol or use drugs.  ALLERGIES:  No Known Allergies  ROS: Pertinent items noted in HPI and remainder of comprehensive ROS otherwise negative.  HOME MEDS: Current Outpatient Prescriptions on File Prior to Visit  Medication Sig Dispense Refill  . levETIRAcetam (KEPPRA) 500 MG tablet Take 1 tablet (500 mg total) by mouth 2 (two) times daily. 60 tablet 4  . vitamin B-12 1000 MCG tablet Take 2 tablets (2,000 mcg total) by mouth daily. 60 tablet 3   No current facility-administered medications on file prior to visit.     LABS/IMAGING: No results found for this or any previous visit (from the past 48 hour(s)). No results found.  LIPID PANEL:    Component Value Date/Time   CHOL 196 03/13/2017 2338   TRIG 74 03/13/2017 2338   HDL 47  03/13/2017 2338   CHOLHDL 4.2 03/13/2017 2338   VLDL 15 03/13/2017 2338   LDLCALC 134 (H) 03/13/2017 2338    WEIGHTS: Wt Readings from Last 3 Encounters:  06/01/17 150 lb 12.8 oz (68.4 kg)  05/08/17 151 lb 12.8 oz (68.9 kg)  03/15/17 167 lb (75.8 kg)    VITALS: BP 128/82   Pulse 69   Ht 5\' 7"  (1.702 m)   Wt 150 lb 12.8 oz (68.4 kg)   BMI 23.62 kg/m   EXAM: Deferred  EKG: Deferred  ASSESSMENT: 1. Critical aortic stenosis - AVA 0.4 cm2 (mean gradient 87 mmHg) 2. Dyspnea-likely secondary to COPD 3. Abnormal EKG suggesting pulmonary disease pattern 4. Recent syncopal episode 5. Seizure  PLAN: 1.   Danny Krause had a syncopal  episode likely related to critical aortic stenosis. He also likely had convulsive activity from cerebral hypoperfusion and this made out of represented seizure. I suspect he'll ultimately come off of his seizure medication. He will need left and right heart catheterization as soon as possible for preoperative workup and is likely a good candidate for traditional aortic valve replacement. He wishes to contact her office next week and try to arrange car to catheterization then. Follow-up with me in one month.  Pixie Casino, MD, [place]  Attending Cardiologist  Direct Dial: [phone]  Fax: [phone]  Website:  [email or web address] [email or web address] 06/01/2017, 4:06 PM

## 2017-06-25 NOTE — Discharge Instructions (Signed)

## 2017-06-26 ENCOUNTER — Telehealth: Payer: Self-pay | Admitting: Internal Medicine

## 2017-06-26 DIAGNOSIS — I350 Nonrheumatic aortic (valve) stenosis: Secondary | ICD-10-CM

## 2017-06-26 DIAGNOSIS — I2510 Atherosclerotic heart disease of native coronary artery without angina pectoris: Secondary | ICD-10-CM

## 2017-06-26 MED FILL — Heparin Sodium (Porcine) Inj 1000 Unit/ML: INTRAMUSCULAR | Qty: 10 | Status: AC

## 2017-06-26 MED FILL — Verapamil HCl IV Soln 2.5 MG/ML: INTRAVENOUS | Qty: 2 | Status: AC

## 2017-06-26 NOTE — Telephone Encounter (Signed)
Spoke with Dr. Claiborne Billings who did heart cath 10/22. Advised that patient have ASAP referral to [facility]. Order placed. Daughter notified that referral order has been put in system as "urgent" and will notify scheduler of urgency.

## 2017-06-26 NOTE — Telephone Encounter (Signed)
New Message     Pt daughter is calling about her dad being discharged, when is the surgery going to be done ? She needs to know what is going to, so she can make arrangements

## 2017-07-02 ENCOUNTER — Encounter: Payer: Self-pay | Admitting: Cardiothoracic Surgery

## 2017-07-02 ENCOUNTER — Institutional Professional Consult (permissible substitution) (INDEPENDENT_AMBULATORY_CARE_PROVIDER_SITE_OTHER): Payer: Medicare Other | Admitting: Cardiothoracic Surgery

## 2017-07-02 ENCOUNTER — Other Ambulatory Visit: Payer: Self-pay

## 2017-07-02 ENCOUNTER — Encounter (HOSPITAL_COMMUNITY): Payer: Medicare Other

## 2017-07-02 ENCOUNTER — Ambulatory Visit
Admission: RE | Admit: 2017-07-02 | Discharge: 2017-07-02 | Disposition: A | Discharge status: Home / Self Care | Payer: Medicare Other | Source: Ambulatory Visit | Attending: Cardiothoracic Surgery | Admitting: Cardiothoracic Surgery

## 2017-07-02 ENCOUNTER — Other Ambulatory Visit: Payer: Self-pay | Admitting: *Deleted

## 2017-07-02 VITALS — BP 130/80 | HR 70 | Resp 18 | Ht 67.0 in | Wt 150.0 lb

## 2017-07-02 DIAGNOSIS — I2510 Atherosclerotic heart disease of native coronary artery without angina pectoris: Secondary | ICD-10-CM

## 2017-07-02 DIAGNOSIS — I350 Nonrheumatic aortic (valve) stenosis: Secondary | ICD-10-CM | POA: Diagnosis not present

## 2017-07-02 DIAGNOSIS — I700 Atherosclerosis of aorta: Secondary | ICD-10-CM | POA: Diagnosis not present

## 2017-07-02 MED ORDER — IOPAMIDOL (ISOVUE-370) INJECTION 76%
75.0000 mL | Freq: Once | INTRAVENOUS | Status: AC | PRN
  Administered 2017-07-02: 75 mL via INTRAVENOUS

## 2017-07-02 NOTE — Patient Instructions (Signed)
Aortic Valve Stenosis Aortic valve stenosis is a narrowing of the aortic valve. The aortic valve opens and closes to regulate blood flow between the lower left chamber of the heart (left ventricle) and the blood vessel that leads away from the heart (aorta). When the aortic valve becomes narrow, it makes it difficult for the heart to pump blood into the aorta, which causes the heart to work harder. The extra work can weaken the heart over time. Aortic valve stenosis can range from mild to severe. If untreated, it can become more severe over time and can lead to heart failure. What are the causes? This condition may be caused by:  Buildup of calcium around and on the valve. This can occur with aging. This is the most common cause of aortic valve stenosis.  Birth defect.  Rheumatic fever.  Radiation to the chest.  What increases the risk? You may be more likely to develop this condition if:  You are over the age of 9.  You were born with an abnormal bicuspid valve.  What are the signs or symptoms? You may have no symptoms until your condition becomes severe. It may take 10-20 years for mild or moderate aortic valve stenosis to become severe. Symptoms may include:  Shortness of breath. This may get worse during physical activity.  Feeling unusually weak and tired (fatigue).  Extreme discomfort in the chest, neck, or arm (angina).  A heartbeat that is irregular or faster than normal (palpitations).  Dizziness or fainting. This may happen when you get physically tired or after you take certain heart medicines, such as nitroglycerin.  How is this diagnosed? This condition may be diagnosed with:  A physical exam.  Echocardiogram. This is a type of imaging test that uses sound waves (ultrasound) to make an image of your heart. There are two types that may be used: ? Transthoracic echocardiogram (TTE). This type of echocardiogram is noninvasive, and it is usually done  first. ? Transesophageal echocardiogram (TEE). This type of echocardiogram is done by passing a flexible tube down your esophagus. The heart and the esophagus are close to each other, so your health care provider can take very clear, detailed pictures of the heart using this type of test.  Cardiac catheterization. In this procedure, a thin, flexible tube (catheter) is passed through a large vein in your neck, groin, or arm. This procedure provides information about arteries, structures, blood pressure, and oxygen levels in your heart.  Electrocardiogram (ECG). This records the electrical impulses of your heart and assesses heart function.  Stress tests. These are tests that evaluate the blood supply to your heart and your heart's response to exercise.  Blood tests.  You may work with a health care provider who specializes in the heart (cardiologist). How is this treated? Treatment depends on how severe your condition is and what your symptoms are. You will need to have your heart checked regularly to make sure that your condition is not getting worse or causing serious problems. If your condition is mild, no treatment may be needed. Treatment may include:  Medicines that help keep your heart rate regular.  Medicines that thin your blood (anticoagulants) to prevent the formation of blood clots.  Antibiotic medicines to help prevent infection.  Surgery to replace your aortic valve. This is the most common treatment for aortic valve stenosis. Several types of surgeries are available. The surgery may be done through a large incision over your heart (open heart surgery), or it may be  done using a minimally invasive technique (transcatheter aortic valve replacement, or TAVR).  Follow these instructions at home: Lifestyle   Limit alcohol intake to no more than 1 drink per day for nonpregnant women and 2 drinks per day for men. One drink equals 12 oz of beer, 5 oz of wine, or 1 oz of hard  liquor.  Do not use any tobacco products, such as cigarettes, chewing tobacco, or e-cigarettes. If you need help quitting, ask your health care provider.  Work with your health care provider to manage your blood pressure and cholesterol.  Maintain a healthy weight. Eating and drinking  Follow instructions from your health care provider about eating or drinking restrictions. ? Limit how much caffeine you drink. Caffeine can affect your heart's rate and rhythm.  Drink enough fluid to keep your urine clear or pale yellow.  Eat a heart-healthy diet. This should include plenty of fresh fruits and vegetables. If you eat meat, it should be lean cuts. Avoid foods that are: ? High in salt, saturated fat, or sugar. ? Canned or highly processed. ? Fried. Activity  Return to your normal activities as told by your health care provider. Ask your health care provider what activities are safe for you.  Exercise regularly, as told by your health care provider. Ask your health care provider what types of exercise are safe for you.  If your aortic valve stenosis is mild, you may need to avoid only very intense physical activity. The more severe your aortic valve stenosis is, the more activities you may need to avoid. General instructions  Take over-the-counter and prescription medicines only as told by your health care provider.  If you are a woman and you plan to become pregnant, talk with your health care provider before you become pregnant.  Tell all health care providers who care for you that you have aortic valve stenosis.  Keep all follow-up visits as told by your health care provider. This is important. Contact a health care provider if:  You have a fever. Get help right away if:  You develop chest pain or tightness.  You develop shortness of breath or difficulty breathing.  You feel light-headed.  You feel like you might faint.  Your heartbeat is irregular or faster than  normal. These symptoms may represent a serious problem that is an emergency. Do not wait to see if the symptoms will go away. Get medical help right away. Call your local emergency services (911 in the [place] Do not drive yourself to the hospital. This information is not intended to replace advice given to you by your health care provider. Make sure you discuss any questions you have with your health care provider. Document Released: 05/20/2003 Document Revised: 01/27/2016 Document Reviewed: 07/25/2015 Elsevier Interactive Patient Education  2017 [place].  Aortic Valve Replacement Aortic valve replacement is surgical replacement of an aortic valve that cannot be repaired. This procedure may be done to replace:  A narrow aortic valve (aortic valve stenosis).  A deformed aortic valve (bicuspid aortic valve).  An aortic valve that does not close all the way (aortic insufficiency).  The aortic valve is replaced with artificial (prosthetic) valve. Three types of prosthetic valves are available:  Mechanical valves made entirely from man-made materials.  Donor valves from human donors. These are only used in special situations.  Biological valves made from animal tissues.  The type of prosthetic valve used will be determined based on various factors, including your age, your lifestyle, and  other medical conditions you have. This procedure is done using an open surgical technique, meaning that a large incision will be made in your chest over your heart. Tell a health care provider about:  Any allergies you have.  All medicines you are taking, including vitamins, herbs, eye drops, creams, and over-the-counter medicines.  Any problems you or family members have had with anesthetic medicines.  Any blood disorders you have.  Any surgeries you have had.  Any medical conditions you have.  Whether you are pregnant or may be pregnant. What are the risks? Generally, this is a safe  procedure. However, problems may occur, including:  Infection.  Bleeding.  Allergic reactions to medicines.  Damage to other structures or organs.  Blood clotting caused by the prosthetic valve.  Failure of the prosthetic valve.  What happens before the procedure?  Ask your health care provider about: ? Changing or stopping your regular medicines. This is especially important if you are taking diabetes medicines or blood thinners. ? Taking medicines such as aspirin and ibuprofen. These medicines can thin your blood. Do not take these medicines before your procedure if your health care provider instructs you not to.  Follow instructions from your health care provider about eating or drinking restrictions.  You may be given antibiotic medicine to help prevent infection.  You may have tests, such as: ? Echocardiogram. ? Electrocardiogram (ECG). ? Blood tests. ? Urine tests.  Plan to have someone take you home when you leave the hospital.  Plan to have someone with you for at least 24 hours after you leave the hospital. What happens during the procedure?  To reduce your risk of infection: ? Your health care team will wash or sanitize their hands. ? Your skin will be washed with soap.  An IV tube will be inserted into one of your veins.  You will be given one or more of the following: ? A medicine to help you relax (sedative). ? A medicine to make you fall asleep (general anesthetic).  You will be placed on a heart-lung bypass machine. This machine provides oxygen to your blood while your heart is undergoing surgery.  An incision will be made in your chest, over your heart.  Your damaged aortic valve will be removed.  A prosthetic valve will be sewn into your heart.  Your incision will be closed with stitches (sutures), skin glue, or adhesive tape.  A bandage (dressing) will be placed over your incision. The procedure may vary among health care providers and  hospitals. What happens after the procedure?  Your blood pressure, heart rate, breathing rate, and blood oxygen level will be monitored often until the medicines you were given have worn off.  Do not drive until your health care provider approves. This information is not intended to replace advice given to you by your health care provider. Make sure you discuss any questions you have with your health care provider. Document Released: 01/10/2005 Document Revised: 01/27/2016 Document Reviewed: 07/25/2015 Elsevier Interactive Patient Education  2017 [place].   Aortic Valve Replacement, Care After Refer to this sheet in the next few weeks. These instructions provide you with information about caring for yourself after your procedure. Your health care provider may also give you more specific instructions. Your treatment has been planned according to current medical practices, but problems sometimes occur. Call your health care provider if you have any problems or questions after your procedure. What can I expect after the procedure? After the procedure,  it is common to have:  Pain around your incision area.  A small amount of blood or clear fluid coming from your incision.  Follow these instructions at home: Eating and drinking   Follow instructions from your health care provider about eating or drinking restrictions. ? Limit alcohol intake to no more than 1 drink per day for nonpregnant women and 2 drinks per day for men. One drink equals 12 oz of beer, 5 oz of wine, or 1 oz of hard liquor. ? Limit how much caffeine you drink. Caffeine can affect your heart's rate and rhythm.  Drink enough fluid to keep your urine clear or pale yellow.  Eat a heart-healthy diet. This should include plenty of fresh fruits and vegetables. If you eat meat, it should be lean cuts. Avoid foods that are: ? High in salt, saturated fat, or sugar. ? Canned or highly processed. ? Fried. Activity  Return  to your normal activities as told by your health care provider. Ask your health care provider what activities are safe for you.  Exercise regularly once you have recovered, as told by your health care provider.  Avoid sitting for more than 2 hours at a time without moving. Get up and move around at least once every 1-2 hours. This helps to prevent blood clots in the legs.  Do not lift anything that is heavier than 10 lb (4.5 kg) until your health care provider approves.  Avoid pushing or pulling things with your arms until your health care provider approves. This includes pulling on handrails to help you climb stairs. Incision care   Follow instructions from your health care provider about how to take care of your incision. Make sure you: ? Wash your hands with soap and water before you change your bandage (dressing). If soap and water are not available, use hand sanitizer. ? Change your dressing as told by your health care provider. ? Leave stitches (sutures), skin glue, or adhesive strips in place. These skin closures may need to stay in place for 2 weeks or longer. If adhesive strip edges start to loosen and curl up, you may trim the loose edges. Do not remove adhesive strips completely unless your health care provider tells you to do that.  Check your incision area every day for signs of infection. Check for: ? More redness, swelling, or pain. ? More fluid or blood. ? Warmth. ? Pus or a bad smell. Medicines  Take over-the-counter and prescription medicines only as told by your health care provider.  If you were prescribed an antibiotic medicine, take it as told by your health care provider. Do not stop taking the antibiotic even if you start to feel better. Travel  Avoid airplane travel for as long as told by your health care provider.  When you travel, bring a list of your medicines and a record of your medical history with you. Carry your medicines with you. Driving  Ask your  health care provider when it is safe for you to drive. Do not drive until your health care provider approves.  Do not drive or operate heavy machinery while taking prescription pain medicine. Lifestyle   Do not use any tobacco products, such as cigarettes, chewing tobacco, or e-cigarettes. If you need help quitting, ask your health care provider.  Resume sexual activity as told by your health care provider. Do not use medicines for erectile dysfunction unless your health care provider approves, if this applies.  Work with your health care provider  to keep your blood pressure and cholesterol under control, and to manage any other heart conditions that you have.  Maintain a healthy weight. General instructions  Do not take baths, swim, or use a hot tub until your health care provider approves.  Do not strain to have a bowel movement.  Avoid crossing your legs while sitting down.  Check your temperature every day for a fever. A fever may be a sign of infection.  If you are a woman and you plan to become pregnant, talk with your health care provider before you become pregnant.  Wear compression stockings if your health care provider instructs you to do this. These stockings help to prevent blood clots and reduce swelling in your legs.  Tell all health care providers who care for you that you have an artificial (prosthetic) aortic valve. If you have or have had heart disease or endocarditis, tell all health care providers about these conditions as well.  Keep all follow-up visits as told by your health care provider. This is important. Contact a health care provider if:  You develop a skin rash.  You experience sudden, unexplained changes in your weight.  You have more redness, swelling, or pain around your incision.  You have more fluid or blood coming from your incision.  Your incision feels warm to the touch.  You have pus or a bad smell coming from your incision.  You have  a fever. Get help right away if:  You develop chest pain that is different from the pain coming from your incision.  You develop shortness of breath or difficulty breathing.  You start to feel light-headed. These symptoms may represent a serious problem that is an emergency. Do not wait to see if the symptoms will go away. Get medical help right away. Call your local emergency services (911 in the [place] Do not drive yourself to the hospital. This information is not intended to replace advice given to you by your health care provider. Make sure you discuss any questions you have with your health care provider. Document Released: 03/09/2005 Document Revised: 01/27/2016 Document Reviewed: 07/25/2015 Elsevier Interactive Patient Education  2017 [employer].

## 2017-07-02 NOTE — Progress Notes (Addendum)
[name] [address]             [phone]                    Danny Krause [facility] Medical Record #[id number] Date of Birth: 1941/06/28  Referring: Danny Casino, MD Primary Care: Danny Gravel, MD  Chief Complaint:   Syncopal episode with seizures in patient with long history of cardiac murmur.  History of Present Illness:    Danny Krause 76 y.o. male is seen in the office  today for evaluation of critical aortic stenosis in a patient who had a major syncopal episode with seizures in July while working in his yard.  The patient gives a history of knowing that he had a heart murmur since age 66.  He has had very little in the way of medical care over the years.  In early July he was cutting the grass in his yard, his family found him unconscious in the yard.  It was not clear how long he had been unconscious.  EMS came and it was documented that he had a seizure.  He was admitted to [facility] for several days, discharged home without a cardiology evaluation on seizure medication.  His wife sees Dr. Debara Krause so ultimately he was seen by cardiology and evaluated with echocardiogram and cardiac catheterization last week.  The patient's had no further syncopal episodes.  He does become short of breath with minimal activity.  With exertion he also notes tightness in his throat and upper chest.  He has been a long-term smoker since age 66.  Prior to the episode in July he been very active individual.     Current Activity/ Functional Status:  Patient is independent with mobility/ambulation, transfers, ADL's, IADL's.   Zubrod Score: At the time of surgery this patient's most appropriate activity status/level should be described as: []     0    Normal activity, no symptoms [x]     1    Restricted in physical strenuous activity but ambulatory, able to do out light work []     2    Ambulatory and capable of self care, unable to do work activities,  up and about               >50 % of waking hours                              []     3    Only limited self care, in bed greater than 50% of waking hours []     4    Completely disabled, no self care, confined to bed or chair []     5    Moribund   Past Medical History:  Diagnosis Date  . Murmur   . Seizure ([place])   . Syncope     Past Surgical History:  Procedure Laterality Date  . NO PAST SURGERIES    . RIGHT/LEFT HEART CATH AND CORONARY ANGIOGRAPHY N/A 06/25/2017   Procedure: RIGHT/LEFT HEART CATH AND CORONARY ANGIOGRAPHY;  Surgeon: Troy Sine, MD;  Location: [place] [facility];  Service: Cardiovascular;  Laterality: N/A;  . THORACIC AORTOGRAM  06/25/2017   Procedure: THORACIC AORTOGRAM;  Surgeon: Troy Sine, MD;  Location: [place] [facility];  Service: Cardiovascular;;  aortic root     Family History  Problem  Relation Age of Onset  . Hernia Father   . Stroke Brother   Patient notes that his father died of 6 mother died at 35, has 3 brothers 1 alive at age 84 with history of stroke and diabetes 2 brothers and died in motor vehicle accidents, 2 sisters one had a myocardial infarction at age 68 and another died in her 80D diabetes complications, patient has 1 son and 1 daughter who are alive,  Social History   Social History  . Marital status: Married    Spouse name: Danny Krause  . Number of children: 2  . Years of education: 12   Occupational History  . Retired    Social History Main Topics  . Smoking status: Current Every Day Smoker    Packs/day: 2.00    Years: 64.00    Types: Cigarettes  . Smokeless tobacco: Never Used     Comment: SINCE I WAS 76 YRS OLD  . Alcohol use No  . Drug use: No  . Sexual activity: Not on file   Other Topics Concern  . Not on file   Social History Narrative   Lives with wife   Caffeine use: some   Right handed    History  Smoking Status  . Current Every Day Smoker  . Packs/day: 2.00  . Years: 64.00  . Types: Cigarettes    Smokeless Tobacco  . Never Used    Comment: SINCE I WAS 76 YRS OLD    History  Alcohol Use No     No Known Allergies  Current Outpatient Prescriptions  Medication Sig Dispense Refill  . acetaminophen (TYLENOL) 650 MG CR tablet Take 650 mg by mouth every 8 (eight) hours as needed for pain.    Marland Kitchen albuterol (PROVENTIL HFA;VENTOLIN HFA) 108 (90 Base) MCG/ACT inhaler Inhale 2 puffs into the lungs every 6 (six) hours as needed for wheezing or shortness of breath.    Marland Kitchen aspirin EC 81 MG tablet Take 81 mg by mouth daily.    Marland Kitchen levETIRAcetam (KEPPRA) 500 MG tablet Take 1 tablet (500 mg total) by mouth 2 (two) times daily. 60 tablet 4  . vitamin B-12 1000 MCG tablet Take 2 tablets (2,000 mcg total) by mouth daily. 60 tablet 3   No current facility-administered medications for this visit.     Pertinent items are noted in HPI.   Review of Systems:     Cardiac Review of Systems: Y or N  Chest Pain Blue.Reese    ]  Resting SOB [  Y ] Exertional SOB  [  Y]  Orthopnea [ Y ]   Pedal Edema [ n  ]    Palpitations [n  ] Syncope  [ yes ]   Presyncope [   ]  General Review of Systems: [Y] = yes [  ]=no Constitional: recent weight change [ n ];  Wt loss over the last 3 months [   ] anorexia [  ]; fatigue [  ]; nausea [  ]; night sweats [  ]; fever [  ]; or chills [  ];          Dental: poor dentition[ dentures  ]; Last Dentist visit:   Eye : blurred vision [  ]; diplopia [   ]; vision changes [  ];  Amaurosis fugax[  ]; Resp: cough [ y ];  wheezing[ n ];  hemoptysis[ n ]; shortness of breath[ n ]; paroxysmal nocturnal dyspnea[ n ]; dyspnea on exertion[ n ]; or  orthopnea[ n ];  GI:  gallstones[  ], vomiting[  ];  dysphagia[  ]; melena[  ];  hematochezia [  ]; heartburn[  ];   Hx of  Colonoscopy[  ]; GU: kidney stones [  ]; hematuria[n  ];   dysuria [ n ];  nocturia[  n];  history of     obstruction [  ]; urinary frequency [  ]             Skin: rash, swelling[  ];, hair loss[  ];  peripheral edema[  ];  or  itching[  ]; Musculosketetal: myalgias[  ];  joint swelling[  ];  joint erythema[ n ];  joint pain[  ];  back pain[ n ];  Heme/Lymph: bruising[  ];  bleeding[  ];  anemia[  ];  Neuro: TIA[  ];  headaches[  ];  stroke[  ];  vertigo[  ];  seizures[  ];   paresthesias[  ];  difficulty walking[  ];  Psych:depression[ y ]; anxiety[ y ];  Endocrine: diabetes[  ];  thyroid dysfunction[  ];  Immunizations: Flu up to date [n  ]; Pneumococcal up to date [last year  ];  Other:  Physical Exam: BP 130/80 (BP Location: Right Arm, Patient Position: Sitting, Cuff Size: Large)   Pulse 70   Resp 18   Ht 5\' 7"  (1.702 m)   Wt 150 lb (68 kg)   SpO2 97% Comment: ON RA  BMI 23.49 kg/m   PHYSICAL EXAMINATION: General appearance: alert, cooperative, appears older than stated age and no distress Head: Normocephalic, without obvious abnormality, atraumatic Neck: no adenopathy, no carotid bruit, no JVD, supple, symmetrical, trachea midline, thyroid not enlarged, symmetric, no tenderness/mass/nodules and Patient has loud murmurs over both carotid arteries radiating from the chest consistent with aortic stenosis Lymph nodes: Cervical, supraclavicular, and axillary nodes normal. Resp: diminished breath sounds bibasilar Back: symmetric, no curvature. ROM normal. No CVA tenderness. Cardio: regular rate and rhythm and systolic murmur: holosystolic 4/6, crescendo and harsh throughout the precordium GI: soft, non-tender; bowel sounds normal; no masses,  no organomegaly Extremities: extremities normal, atraumatic, no cyanosis or edema and varicose veins noted Neurologic: Grossly normal  Diagnostic Studies & Laboratory data:     Recent Radiology Findings:   Study Result   CLINICAL DATA:  Found down.  Possible seizure or stroke.  EXAM: MRI HEAD WITHOUT AND WITH CONTRAST  TECHNIQUE: Multiplanar, multiecho pulse sequences of the brain and surrounding structures were obtained without and with intravenous  contrast.  CONTRAST:  15mL MULTIHANCE GADOBENATE DIMEGLUMINE 529 MG/ML IV SOLN  COMPARISON:  Motion degraded head CT 03/13/2017.  FINDINGS: The patient was unable to remain motionless for the exam. Small or subtle lesions could be overlooked.  Brain: No evidence for acute infarction, hemorrhage, mass lesion, hydrocephalus, or extra-axial fluid. Moderate cerebral and cerebellar atrophy. Mild subcortical and periventricular T2 and FLAIR hyperintensities, likely chronic microvascular ischemic change.  Post infusion, no abnormal enhancement of the brain or meninges.  Vascular: Flow voids are maintained throughout the carotid, basilar, and vertebral arteries. There are no areas of chronic hemorrhage.  Skull and upper cervical spine: Normal marrow signal. Small midline scalp hematoma near the vertex.  Sinuses/Orbits: No acute finding.  Other: Unremarkable.  IMPRESSION: Motion degraded examination demonstrating no definite acute or focal intracranial abnormality.  Atrophy and small vessel disease. No visible abnormal postcontrast enhancement.  Small scalp hematoma near the vertex.   Electronically Signed   By: Roderic Ovens.D.  On: 03/14/2017 11:09      Recent Lab Findings: Lab Results  Component Value Date   WBC 8.2 06/18/2017   HGB 11.1 (L) 06/18/2017   HCT 35.3 (L) 06/18/2017   PLT 329 06/18/2017   GLUCOSE 101 (H) 06/18/2017   CHOL 196 03/13/2017   TRIG 74 03/13/2017   HDL 47 03/13/2017   LDLCALC 134 (H) 03/13/2017   ALT 13 (L) 03/15/2017   AST 15 03/15/2017   NA 141 06/18/2017   K 5.0 06/18/2017   CL 103 06/18/2017   CREATININE 1.03 06/18/2017   BUN 17 06/18/2017   CO2 23 06/18/2017   TSH 3.310 06/18/2017   INR 1.0 06/18/2017   HGBA1C 5.9 (H) 03/13/2017   Procedures   RIGHT/LEFT HEART CATH AND CORONARY ANGIOGRAPHY  THORACIC AORTOGRAM  Conclusion     Prox LAD lesion, 70 %stenosed.  Prox LAD to Mid LAD lesion, 40  %stenosed.  Prox Cx to Mid Cx lesion, 50 %stenosed.  Prox RCA lesion, 50 %stenosed.  Prox RCA to Mid RCA lesion, 80 %stenosed.   Critical aortic valve stenosis with a peak to peak gradient of 104, mean gradient of 80 mmHg, and aortic valve area of 0.37 cm.  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  There is severely reduced aortic valve excursion with trivial AR.  Normal LV function with an EF of 50-55%;  severe left ventricular hypertrophy.  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  RECOMMENDATION: Aortic valve replacement with CABG revascularization surgery.    Right Heart   Right Heart Pressures RA: A wave 12, V wave 10, mean 9 RV: 46/8 PA: 45/21 PW: A wave 20, V wave 16; mean 13  AO: 127/65 PA: 45/21  LV: 206/24 PW: 21  Pullback: LV: 231/23 AO: 127/65  Oxygen saturation in the PA 62% and in the AO 98%.  Cardiac output by the thermodilution method 3.9, and by the Fick method 4.4 L/m Cardiac index by the thermodilution method 2.2, and by the Fick method 2.5 L/m/m  Aortic valve: Peak to peak gradient 104 mm Hg, mean gradient 80 mm Hg Aortic valve area 0.37 cm      I have independently reviewed the above  cath films and reviewed the findings with the  patient .    Result status: Final result                           [facility]                        [address], [place] [address]                            [phone]  ------------------------------------------------------------------- Transthoracic Echocardiography  Patient:    Danny Krause, Danny Krause MR #:       [id number] Study Date: 05/15/2017 Gender:     M Age:        89 Height:     170.2 cm Weight:     68.9 kg BSA:  1.81 m^2 Pt. Status: Room:   ATTENDING    Candee Furbish, M.D.  SONOGRAPHER  Cindy Hazy, [place] MD  [place] MD  PERFORMING   [facility], Outpatient  cc:  ------------------------------------------------------------------- LV EF: 55% -   60%  ------------------------------------------------------------------- Indications:      R01.1 Murmur.  ------------------------------------------------------------------- History:   PMH:  Acquired from the patient and from the patient&'s chart.  PMH:  Murmur. Seizure. Syncope.  ------------------------------------------------------------------- Study Conclusions  - Left ventricle: The cavity size was normal. Systolic function was   normal. The estimated ejection fraction was in the range of 55%   to 60%. Wall motion was normal; there were no regional wall   motion abnormalities. Doppler parameters are consistent with   abnormal left ventricular relaxation (grade 1 diastolic   dysfunction). - Aortic valve: There was severe stenosis. Peak velocity (S): 552   cm/s. Mean gradient (S): 87 mm Hg. Valve area (Vmax): 0.4 cm^2.  ------------------------------------------------------------------- Study data:   Study status:  Routine.  Procedure:  The patient reported no pain pre or post test. Transthoracic echocardiography for left ventricular function evaluation, for right ventricular function evaluation, and for assessment of valvular function. Image quality was adequate.  Study completion:  There were no complications.          Transthoracic echocardiography.  M-mode, complete 2D, spectral Doppler, and color Doppler.  Birthdate: Patient birthdate: [birth year].  Age:  Patient is 76 yr old.  Sex: Gender: male.    BMI: 23.8 kg/m^2.  Blood pressure:     128/76 Patient status:  Outpatient.  Study date:  Study date: 05/15/2017. Study time: 12:57 PM.  Location:  [facility]  -------------------------------------------------------------------  ------------------------------------------------------------------- Left ventricle:  The cavity size was normal. Systolic function was normal. The estimated ejection fraction was in the range of 55% to 60%. Wall motion was normal; there were no regional wall motion abnormalities. Doppler parameters are consistent with abnormal left ventricular relaxation (grade 1 diastolic dysfunction).  ------------------------------------------------------------------- Aortic valve:   Trileaflet; severely thickened, severely calcified leaflets. Mobility was not restricted.  Doppler:   There was severe stenosis.   There was no regurgitation.    VTI ratio of LVOT to aortic valve: 0.1. Valve area (VTI): 0.4 cm^2. Indexed valve area (VTI): 0.22 cm^2/m^2. Peak velocity ratio of LVOT to aortic valve: 0.1. Valve area (Vmax): 0.4 cm^2. Indexed valve area (Vmax): 0.22 cm^2/m^2. Mean velocity ratio of LVOT to aortic valve: 0.1. Valve area (Vmean): 0.39 cm^2. Indexed valve area (Vmean): 0.22 cm^2/m^2.    Mean gradient (S): 87 mm Hg. Peak gradient (S): 122 mm Hg.  ------------------------------------------------------------------- Aorta:  Aortic root: The aortic root was normal in size.  ------------------------------------------------------------------- Mitral valve:   Structurally normal valve.   Mobility was not restricted.  Doppler:  Transvalvular velocity was within the normal range. There was no evidence for stenosis. There was no regurgitation.    Peak gradient (D): 2 mm Hg.  ------------------------------------------------------------------- Left atrium:  The atrium was normal in size.  ------------------------------------------------------------------- Right ventricle:  The cavity size was normal. Wall thickness was normal. Systolic function was  normal.  ------------------------------------------------------------------- Pulmonic valve:   Poorly visualized.  Structurally normal valve. Cusp separation was normal.  Doppler:  Transvalvular velocity was within the normal range. There was no evidence for stenosis. There was no regurgitation.  ------------------------------------------------------------------- Tricuspid valve:   Structurally normal valve.    Doppler: Transvalvular velocity was within the  normal range. There was no regurgitation.  ------------------------------------------------------------------- Pulmonary artery:   The main pulmonary artery was normal-sized. Systolic pressure was within the normal range.  ------------------------------------------------------------------- Right atrium:  The atrium was normal in size.  ------------------------------------------------------------------- Pericardium:  A prominent pericardial fat pad was present. There was no pericardial effusion.  ------------------------------------------------------------------- Systemic veins: Inferior vena cava: The vessel was normal in size.  ------------------------------------------------------------------- Measurements   Left ventricle                            Value          Reference  LV ID, ED, PLAX chordal                   43.1  mm       43 - 52  LV ID, ES, PLAX chordal                   29.6  mm       23 - 38  LV fx shortening, PLAX chordal            31    %        >=29  LV PW thickness, ED                       16.6  mm       ---------  IVS/LV PW ratio, ED                       0.99           <=1.3  Stroke volume, 2D                         56    ml       ---------  Stroke volume/bsa, 2D                     31    ml/m^2   ---------  LV e&', lateral                            4.61  cm/s     ---------  LV E/e&', lateral                          15.94          ---------  LV e&', medial                             3.95   cm/s     ---------  LV E/e&', medial                           18.61          ---------  LV e&', average                            4.28  cm/s     ---------  LV E/e&', average                          17.17          ---------  Ventricular septum                        Value          Reference  IVS thickness, ED                         16.4  mm       ---------    LVOT                                      Value          Reference  LVOT ID, S                                22    mm       ---------  LVOT area                                 3.8   cm^2     ---------  LVOT ID                                   22    mm       ---------  LVOT peak velocity, S                     57.4  cm/s     ---------  LVOT mean velocity, S                     46.3  cm/s     ---------  LVOT VTI, S                               14.8  cm       ---------  LVOT peak gradient, S                     1     mm Hg    ---------  Stroke volume (SV), LVOT DP               56.3  ml       ---------  Stroke index (SV/bsa), LVOT DP            31.1  ml/m^2   ---------    Aortic valve                              Value          Reference  Aortic valve peak velocity, S             552   cm/s     ---------  Aortic valve mean velocity, S             452   cm/s     ---------  Aortic valve VTI, S                       141   cm       ---------  Aortic mean gradient, S                   87    mm Hg    ---------  Aortic peak gradient, S                   122   mm Hg    ---------  VTI ratio, LVOT/AV                        0.1            ---------  Aortic valve area, VTI                    0.4   cm^2     ---------  Aortic valve area/bsa, VTI                0.22  cm^2/m^2 ---------  Velocity ratio, peak, LVOT/AV             0.1            ---------  Aortic valve area, peak velocity          0.4   cm^2     ---------  Aortic valve area/bsa, peak               0.22  cm^2/m^2 ---------  velocity  Velocity ratio, mean, LVOT/AV              0.1            ---------  Aortic valve area, mean velocity          0.39  cm^2     ---------  Aortic valve area/bsa, mean               0.22  cm^2/m^2 ---------  velocity    Aorta                                     Value          Reference  Aortic root ID, ED                        34    mm       ---------    Left atrium                               Value          Reference  LA ID, A-P, ES                            44    mm       ---------  LA ID/bsa, A-P                    (H)     2.43  cm/m^2   <=2.2  LA volume, S                              54    ml       ---------  LA volume/bsa, S  29.8  ml/m^2   ---------  LA volume, ES, 1-p A4C                    47    ml       ---------  LA volume/bsa, ES, 1-p A4C                26    ml/m^2   ---------  LA volume, ES, 1-p A2C                    52    ml       ---------  LA volume/bsa, ES, 1-p A2C                28.7  ml/m^2   ---------    Mitral valve                              Value          Reference  Mitral E-wave peak velocity               73.5  cm/s     ---------  Mitral A-wave peak velocity               125   cm/s     ---------  Mitral deceleration time          (H)     275   ms       150 - 230  Mitral peak gradient, D                   2     mm Hg    ---------  Mitral E/A ratio, peak                    0.6            ---------    Right ventricle                           Value          Reference  RV s&', lateral, S                         12.1  cm/s     ---------  Legend: (L)  and  (H)  mark values outside specified reference range.  ------------------------------------------------------------------- Prepared and Electronically Authenticated by  Candee Furbish, M.D. 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  I reviewed the patient's history, symptoms, past records, cath and echo findings.  He has very critical aortic stenosis, with very worrisome symptoms.  I recommended to the patient that we proceed relatively quickly with coronary artery bypass grafting and aortic valve replacement.  With his age of recommended tissue valve would be the most appropriate.  Patient is agreeable with this we will obtain a CTA of the chest this afternoon to fully evaluate the size of his aorta, 34 mm on echo.  And plan to proceed with aortic valve replacement and coronary artery bypass grafting  possible replacement of a sending aorta if necessary on Wednesday October 31. Risks and options were discussed with the patient his son and brother-in-law in detail, including the risk of death infection stroke bleeding myocardial infarction need for permanent pacemaker.  Patient is agreeable and had his questions answered.   I  spent 40 minutes counseling the patient face to face and 50% or more the  time was spent in counseling and coordination of care. The total time spent in the appointment was 60 minutes.  Grace Isaac MD      [address] [place] [address] Office [phone]   [facility]eeper [phone]   Addendum: 07/02/2017 1:29 PM  Ct Angio Chest Aorta W/cm &/or Wo/cm  Result Date: 07/02/2017 CLINICAL DATA:  Preoperative study prior to aortic valve replacement. EXAM: CT ANGIOGRAPHY CHEST WITH CONTRAST TECHNIQUE: Multidetector CT imaging of the chest was performed using the standard protocol during bolus administration of intravenous contrast. Multiplanar CT image reconstructions and MIPs were obtained to evaluate the vascular anatomy. CONTRAST:  75 cc Isovue 370 intravenous contrast. COMPARISON:  None. FINDINGS: Cardiovascular: Preferential opacification of the  thoracic aorta. No evidence of thoracic aortic aneurysm or dissection. The ascending thoracic aorta measures up to 3.8 cm. Heart size is at the upper limits of normal. No pericardial effusion. Coronary, aortic arch, and branch vessel atherosclerotic vascular disease. Extensive aortic valve calcifications. Mediastinum/Nodes: No enlarged mediastinal, hilar, or axillary lymph nodes. Thyroid gland, trachea, and esophagus demonstrate no significant findings. 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 Mild diffuse peribronchial thickening. There is a 10 x 7 x 13 mm solid nodule in the left lower lobe (series 5, image 89). Additional 17 x 10 x 9 mm irregular nodule in the right lower lobe (series 5, image 16). No pleural effusion or pneumothorax. Upper Abdomen: No acute abnormality. Musculoskeletal: No chest wall abnormality. No acute or significant osseous findings. Review of the MIP images confirms the above findings. IMPRESSION: 1. No evidence of thoracic aortic aneurysm. Aortic atherosclerosis (ICD10-I70.0). Extensive aortic valve calcifications. 2. 1.3 cm solid nodule in the left lower lobe. 1.7 cm solid nodule in the right lower lobe. Consider one of the following in 3 months for both low-risk and high-risk individuals: (a) repeat chest CT, (b) follow-up PET-CT, or (c) tissue sampling. This recommendation follows the consensus statement: Guidelines for Management of Incidental Pulmonary Nodules Detected on CT Images: From the Fleischner Society 2017; Radiology 2017; 284:228-243. 3. 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 Electronically Signed   By: Titus Dubin M.D.   On: 07/02/2017 14:24   1/NO Ascending aortic aneurysm 2/Moderate upper lobe predominant centrilobular and paraseptal emphysema. Mild diffuse 3/peribronchial thickening. 4/10 x 7 x 13 mm solid nodule in the left lower lobe (series 5, image 89). Additional 17 x 10 x 9 mm irregular  nodule in the right lower lobe   Grace Isaac MD      [address] [place], [address] Office [phone]   [place]

## 2017-07-03 ENCOUNTER — Encounter (HOSPITAL_COMMUNITY)
Admission: RE | Admit: 2017-07-03 | Discharge: 2017-07-03 | Disposition: A | Discharge status: Home / Self Care | Payer: Medicare Other | Source: Ambulatory Visit | Attending: Cardiothoracic Surgery | Admitting: Cardiothoracic Surgery

## 2017-07-03 ENCOUNTER — Ambulatory Visit (HOSPITAL_BASED_OUTPATIENT_CLINIC_OR_DEPARTMENT_OTHER)
Admission: RE | Admit: 2017-07-03 | Discharge: 2017-07-03 | Disposition: A | Discharge status: Home / Self Care | Payer: Medicare Other | Source: Ambulatory Visit | Attending: Cardiothoracic Surgery | Admitting: Cardiothoracic Surgery

## 2017-07-03 ENCOUNTER — Encounter (HOSPITAL_COMMUNITY): Payer: Self-pay

## 2017-07-03 ENCOUNTER — Other Ambulatory Visit: Payer: Self-pay | Admitting: *Deleted

## 2017-07-03 ENCOUNTER — Ambulatory Visit (HOSPITAL_COMMUNITY)
Admission: RE | Admit: 2017-07-03 | Discharge: 2017-07-03 | Disposition: A | Discharge status: Home / Self Care | Payer: Medicare Other | Source: Ambulatory Visit | Attending: Cardiothoracic Surgery | Admitting: Cardiothoracic Surgery

## 2017-07-03 DIAGNOSIS — I6522 Occlusion and stenosis of left carotid artery: Secondary | ICD-10-CM

## 2017-07-03 DIAGNOSIS — I2510 Atherosclerotic heart disease of native coronary artery without angina pectoris: Secondary | ICD-10-CM

## 2017-07-03 DIAGNOSIS — Z01818 Encounter for other preprocedural examination: Secondary | ICD-10-CM

## 2017-07-03 DIAGNOSIS — R55 Syncope and collapse: Secondary | ICD-10-CM

## 2017-07-03 DIAGNOSIS — Z79899 Other long term (current) drug therapy: Secondary | ICD-10-CM | POA: Insufficient documentation

## 2017-07-03 DIAGNOSIS — Z01810 Encounter for preprocedural cardiovascular examination: Secondary | ICD-10-CM

## 2017-07-03 DIAGNOSIS — Z87891 Personal history of nicotine dependence: Secondary | ICD-10-CM

## 2017-07-03 DIAGNOSIS — Z01812 Encounter for preprocedural laboratory examination: Secondary | ICD-10-CM

## 2017-07-03 DIAGNOSIS — I350 Nonrheumatic aortic (valve) stenosis: Secondary | ICD-10-CM | POA: Diagnosis not present

## 2017-07-03 DIAGNOSIS — R918 Other nonspecific abnormal finding of lung field: Secondary | ICD-10-CM | POA: Diagnosis not present

## 2017-07-03 DIAGNOSIS — R899 Unspecified abnormal finding in specimens from other organs, systems and tissues: Secondary | ICD-10-CM

## 2017-07-03 HISTORY — DX: Atherosclerotic heart disease of native coronary artery without angina pectoris: I25.10

## 2017-07-03 HISTORY — DX: Nonrheumatic aortic (valve) stenosis: I35.0

## 2017-07-03 HISTORY — DX: Angina pectoris, unspecified: I20.9

## 2017-07-03 HISTORY — DX: Dyspnea, unspecified: R06.00

## 2017-07-03 LAB — PULMONARY FUNCTION TEST
DL/VA % pred: 48 %
DL/VA: 2.14 ml/min/mmHg/L
DLCO cor % pred: 35 %
DLCO cor: 9.98 ml/min/mmHg
DLCO unc % pred: 29 %
DLCO unc: 8.35 ml/min/mmHg
FEF 25-75 Post: 0.83 L/sec
FEF 25-75 Pre: 0.29 L/sec
FEF2575-%Change-Post: 183 %
FEF2575-%Pred-Post: 44 %
FEF2575-%Pred-Pre: 15 %
FEV1-%Change-Post: 22 %
FEV1-%Pred-Post: 65 %
FEV1-%Pred-Pre: 53 %
FEV1-Post: 1.72 L
FEV1-Pre: 1.41 L
FEV1FVC-%Change-Post: -3 %
FEV1FVC-%Pred-Pre: 61 %
FEV6-%Change-Post: 30 %
FEV6-%Pred-Post: 92 %
FEV6-%Pred-Pre: 70 %
FEV6-Post: 3.17 L
FEV6-Pre: 2.42 L
FEV6FVC-%Change-Post: 3 %
FEV6FVC-%Pred-Post: 84 %
FEV6FVC-%Pred-Pre: 81 %
FVC-%Change-Post: 26 %
FVC-%Pred-Post: 109 %
FVC-%Pred-Pre: 86 %
FVC-Post: 4.03 L
FVC-Pre: 3.19 L
Post FEV1/FVC ratio: 43 %
Post FEV6/FVC ratio: 79 %
Pre FEV1/FVC ratio: 44 %
Pre FEV6/FVC Ratio: 76 %
RV % pred: 186 %
RV: 4.51 L
TLC % pred: 125 %
TLC: 8.09 L

## 2017-07-03 LAB — CBC
HCT: 31.6 % — ABNORMAL LOW (ref 39.0–52.0)
Hemoglobin: 9.9 g/dL — ABNORMAL LOW (ref 13.0–17.0)
MCH: 25.1 pg — ABNORMAL LOW (ref 26.0–34.0)
MCHC: 31.3 g/dL (ref 30.0–36.0)
MCV: 80 fL (ref 78.0–100.0)
Platelets: 302 10*3/uL (ref 150–400)
RBC: 3.95 MIL/uL — ABNORMAL LOW (ref 4.22–5.81)
RDW: 13.7 % (ref 11.5–15.5)
WBC: 9.7 10*3/uL (ref 4.0–10.5)

## 2017-07-03 LAB — BLOOD GAS, ARTERIAL
Acid-Base Excess: 1.5 mmol/L (ref 0.0–2.0)
Bicarbonate: 25.3 mmol/L (ref 20.0–28.0)
Drawn by: 421801
FIO2: 21
O2 Saturation: 96.7 %
Patient temperature: 98.6
pCO2 arterial: 38.6 mmHg (ref 32.0–48.0)
pH, Arterial: 7.433 (ref 7.350–7.450)
pO2, Arterial: 86.9 mmHg (ref 83.0–108.0)

## 2017-07-03 LAB — COMPREHENSIVE METABOLIC PANEL
ALT: 9 U/L — ABNORMAL LOW (ref 17–63)
AST: 13 U/L — ABNORMAL LOW (ref 15–41)
Albumin: 3.3 g/dL — ABNORMAL LOW (ref 3.5–5.0)
Alkaline Phosphatase: 73 U/L (ref 38–126)
Anion gap: 10 (ref 5–15)
BUN: 17 mg/dL (ref 6–20)
CO2: 22 mmol/L (ref 22–32)
Calcium: 9 mg/dL (ref 8.9–10.3)
Chloride: 103 mmol/L (ref 101–111)
Creatinine, Ser: 1.06 mg/dL (ref 0.61–1.24)
GFR calc Af Amer: >60 mL/min (ref >60)
GFR calc non Af Amer: >60 mL/min (ref >60)
Glucose, Bld: 113 mg/dL — ABNORMAL HIGH (ref 65–99)
Potassium: 4.6 mmol/L (ref 3.5–5.1)
Sodium: 135 mmol/L (ref 135–145)
Total Bilirubin: 0.3 mg/dL (ref 0.3–1.2)
Total Protein: 6.7 g/dL (ref 6.5–8.1)

## 2017-07-03 LAB — URINALYSIS, ROUTINE W REFLEX MICROSCOPIC
Bilirubin Urine: NEGATIVE
Glucose, UA: NEGATIVE mg/dL
Hgb urine dipstick: NEGATIVE
Ketones, ur: NEGATIVE mg/dL
Leukocytes, UA: NEGATIVE
Nitrite: NEGATIVE
Protein, ur: NEGATIVE mg/dL
Specific Gravity, Urine: 1.019 (ref 1.005–1.030)
pH: 6 (ref 5.0–8.0)

## 2017-07-03 LAB — ABO/RH: ABO/RH(D): A POS

## 2017-07-03 LAB — SURGICAL PCR SCREEN
MRSA, PCR: NEGATIVE
STAPHYLOCOCCUS AUREUS: NEGATIVE

## 2017-07-03 LAB — PROTIME-INR
INR: 0.97
Prothrombin Time: 12.8 seconds (ref 11.4–15.2)

## 2017-07-03 LAB — HEMOGLOBIN A1C
Hgb A1c MFr Bld: 5.9 % — ABNORMAL HIGH (ref 4.8–5.6)
Mean Plasma Glucose: 122.63 mg/dL

## 2017-07-03 LAB — APTT: aPTT: 47 seconds — ABNORMAL HIGH (ref 24–36)

## 2017-07-03 MED ORDER — TRANEXAMIC ACID (OHS) PUMP PRIME SOLUTION
2.0000 mg/kg | INTRAVENOUS | Status: DC
  Filled 2017-07-03: qty 1.36

## 2017-07-03 MED ORDER — VANCOMYCIN HCL 10 G IV SOLR
1250.0000 mg | INTRAVENOUS | Status: AC
  Administered 2017-07-04: 1250 mg via INTRAVENOUS
  Filled 2017-07-03: qty 1250

## 2017-07-03 MED ORDER — METOPROLOL TARTRATE 12.5 MG HALF TABLET
12.5000 mg | ORAL_TABLET | Freq: Once | ORAL | Status: AC
  Administered 2017-07-04: 12.5 mg via ORAL
  Filled 2017-07-03: qty 1

## 2017-07-03 MED ORDER — TRANEXAMIC ACID 1000 MG/10ML IV SOLN
1.5000 mg/kg/h | INTRAVENOUS | Status: AC
  Administered 2017-07-04: 1.5 mg/kg/h via INTRAVENOUS
  Filled 2017-07-03: qty 25

## 2017-07-03 MED ORDER — PLASMA-LYTE 148 IV SOLN
INTRAVENOUS | Status: AC
  Administered 2017-07-04: 500 mL
  Filled 2017-07-03: qty 2.5

## 2017-07-03 MED ORDER — HEPARIN SODIUM (PORCINE) 1000 UNIT/ML IJ SOLN
INTRAMUSCULAR | Status: DC
  Filled 2017-07-03: qty 30

## 2017-07-03 MED ORDER — ALBUTEROL SULFATE (2.5 MG/3ML) 0.083% IN NEBU
2.5000 mg | INHALATION_SOLUTION | Freq: Once | RESPIRATORY_TRACT | Status: AC
  Administered 2017-07-03: 2.5 mg via RESPIRATORY_TRACT

## 2017-07-03 MED ORDER — DOPAMINE-DEXTROSE 3.2-5 MG/ML-% IV SOLN
0.0000 ug/kg/min | INTRAVENOUS | Status: AC
  Administered 2017-07-04: 3 ug/kg/min via INTRAVENOUS
  Filled 2017-07-03: qty 250

## 2017-07-03 MED ORDER — EPINEPHRINE PF 1 MG/ML IJ SOLN
0.0000 ug/min | INTRAMUSCULAR | Status: DC
  Filled 2017-07-03: qty 4

## 2017-07-03 MED ORDER — DEXTROSE 5 % IV SOLN
750.0000 mg | INTRAVENOUS | Status: DC
  Filled 2017-07-03: qty 750

## 2017-07-03 MED ORDER — SODIUM CHLORIDE 0.9 % IV SOLN
INTRAVENOUS | Status: DC
  Filled 2017-07-03: qty 1

## 2017-07-03 MED ORDER — NITROGLYCERIN IN D5W 200-5 MCG/ML-% IV SOLN
2.0000 ug/min | INTRAVENOUS | Status: DC
  Filled 2017-07-03: qty 250

## 2017-07-03 MED ORDER — CEFUROXIME SODIUM 1.5 G IV SOLR
1.5000 g | INTRAVENOUS | Status: AC
  Administered 2017-07-04: .75 g via INTRAVENOUS
  Administered 2017-07-04: 1.5 g via INTRAVENOUS
  Filled 2017-07-03: qty 1.5

## 2017-07-03 MED ORDER — POTASSIUM CHLORIDE 2 MEQ/ML IV SOLN
80.0000 meq | INTRAVENOUS | Status: DC
  Filled 2017-07-03: qty 40

## 2017-07-03 MED ORDER — CHLORHEXIDINE GLUCONATE 0.12 % MT SOLN
15.0000 mL | Freq: Once | OROMUCOSAL | Status: AC
  Administered 2017-07-04: 15 mL via OROMUCOSAL
  Filled 2017-07-03: qty 15

## 2017-07-03 MED ORDER — SODIUM CHLORIDE 0.9 % IV SOLN
30.0000 ug/min | INTRAVENOUS | Status: AC
  Administered 2017-07-04: 20 ug/min via INTRAVENOUS
  Filled 2017-07-03: qty 2

## 2017-07-03 MED ORDER — TRANEXAMIC ACID (OHS) BOLUS VIA INFUSION
15.0000 mg/kg | INTRAVENOUS | Status: AC
  Administered 2017-07-04: 1020 mg via INTRAVENOUS
  Filled 2017-07-03: qty 1020

## 2017-07-03 MED ORDER — MAGNESIUM SULFATE 50 % IJ SOLN
40.0000 meq | INTRAMUSCULAR | Status: DC
  Filled 2017-07-03: qty 9.85

## 2017-07-03 MED ORDER — DEXMEDETOMIDINE HCL IN NACL 400 MCG/100ML IV SOLN
0.1000 ug/kg/h | INTRAVENOUS | Status: DC
  Filled 2017-07-03: qty 100

## 2017-07-03 NOTE — Progress Notes (Signed)
Anesthesia Chart Review: Patient is a 76 year old male scheduled for CABG/AVR, possible replacement of ascending aorta on 07/04/17 by Dr. Lanelle Bal. No evidence of TAA on 07/02/17 CTA.  History includes smoking, syncope with seizure (while working in yard) 03/13/17 and started on Keppra with out-patient work-up revealing critical AS and CAD (also started on Keppra , exertional dyspnea and chest pain.   PCP is Dr. Jani Gravel. Cardiologist is Dr. Lyman Bishop. Neurologist is Dr. Floyde Parkins.  Meds include albuterol, Keppra, ASA 81 mg, vitamin B-12.   EKG 06/25/17: SR with first degree AV block, rightward axis, incomplete right BBB.  Cardiac cath 06/25/17:  Prox LAD lesion, 70 %stenosed.  Prox LAD to Mid LAD lesion, 40 %stenosed.  Prox Cx to Mid Cx lesion, 50 %stenosed.  Prox RCA lesion, 50 %stenosed.  Prox RCA to Mid RCA lesion, 80 %stenosed.  Critical aortic valve stenosis with a peak to peak gradient of 104, mean gradient of 80 mmHg, and aortic valve area of 0.37 cm. 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  There is severely reduced aortic valve excursion with trivial AR. Normal LV function with an EF of 50-55%;  severe left ventricular hypertrophy. 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 RECOMMENDATION: Aortic valve replacement with CABG revascularization surgery.  Echo 05/15/17: Study Conclusions - Left ventricle: The cavity size was normal. Systolic function was   normal. The estimated ejection fraction was in the range of 55%   to 60%. Wall motion was normal; there were no regional wall   motion abnormalities. Doppler parameters are consistent with   abnormal left ventricular relaxation (grade 1 diastolic   dysfunction). - Aortic valve: There  was severe stenosis. Peak velocity (S): 552   cm/s. Mean gradient (S): 87 mm Hg. Valve area (Vmax): 0.4 cm^2.  Carotid U/S 07/03/17: Final Interpretation: - Right Carotid: There is no evidence of stenosis in the right ICA. - Left Carotid: There is evidence in the left ICA of a 1-39% stenosis. - Vertebrals: Both vertebral arteries were patent with antegrade flow. - Subclavians: Normal flow hemodynamics were seen in bilateral subclavian arteries.  CTA chest 07/02/17: IMPRESSION: 1. No evidence of thoracic aortic aneurysm. Aortic atherosclerosis (ICD10-I70.0). Extensive aortic valve calcifications. 2. 1.3 cm solid nodule in the left lower lobe. 1.7 cm solid nodule in the right lower lobe. Consider one of the following in 3 months for both low-risk and high-risk individuals: (a) repeat chest CT, (b) follow-up PET-CT, or (c) tissue sampling. This recommendation follows the consensus statement: Guidelines for Management of Incidental Pulmonary Nodules Detected on CT Images: From the Fleischner Society 2017; Radiology 2017; 284:228-243. 3. 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  CXR 07/03/17: IMPRESSION: No acute abnormality is noted.  Likely chronic bronchitic changes.  MRI 03/14/17: IMPRESSION: Motion degraded examination demonstrating no definite acute or focal intracranial abnormality. Atrophy and small vessel disease. No visible abnormal postcontrast enhancement. Small scalp hematoma near the vertex.  EEG 03/13/17:  Clinical Interpretation: This normal EEG is recorded in the waking  state. There was no seizure or seizure predisposition recorded on this study. Please note that a normal EEG does not preclude the possibility of epilepsy.   PFTs 07/03/17: FVC 3.19 (86%), FEV1 1.41 (53%), DLCO unc 8.35 (29%), cor 9.98 (35%).  Preoperative labs noted. Glucose 113, Cr  1.06, AST 13, ALT 9. A1c 5.9. H/H 9.9/31.6, PLT 302, PT/INR 12.8/0.97, PTT 47. UA WNL. I called HGB  and PTT to [facility] nurse Manuela Schwartz. She will review with Dr. Servando Snare. I entered a repeat PTT order--it looks like [facility] has also entered an order for lupus anticoagulant.   [facility]/Anesthesiology Phone [phone] 07/03/2017 4:38 PM

## 2017-07-03 NOTE — Progress Notes (Signed)
Danny Krause, Danny Krause with anesthesia notified of patient's abnormal lab values.  Left message for Joline, RN at Dr. Everrett Coombe office of patient's abnormal lab values.

## 2017-07-03 NOTE — Pre-Procedure Instructions (Signed)
Danny Krause  07/03/2017      [id number], [place] [address] Phone: [phone] Fax: [phone]    Your procedure is scheduled on Wednesday 07/04/2017.  Report to [facility] Admitting at [place].  Call this number if you have problems the morning of surgery:  [phone]   Remember:  Do not eat food or drink liquids after midnight.   Take these medicines the morning of surgery with A SIP OF WATER: Acetaminophen (Tylenol) - if needed Albuterol inhaler - if needed levetiracetam (Keppra)  7 days prior to surgery STOP taking any Aspirin (unless otherwise instructed by your surgeon), Aleve, Naproxen, Ibuprofen, Motrin, Advil, Goody's, BC's, all herbal medications, fish oil, and all vitamins  Follow your doctors instructions regarding your Aspirin.  If no instructions were given by the doctor you will need to call the office to get instructions.      Do not wear jewelry  Do not wear lotions, powders, or colognes, or deodorant.  Men may shave face and neck.  Do not bring valuables to the hospital.  [facility] is not responsible for any belongings or valuables.  Contacts, eyeglasses, dentures or bridgework may not be worn into surgery.  Leave your suitcase in the car.  After surgery it may be brought to your room.  For patients admitted to the hospital, discharge time will be determined by your treatment team.  Patients discharged the day of surgery will not be allowed to drive home.   Name and phone number of your driver:    Special instructions:   Danny Krause- Preparing For Surgery  Before surgery, you can play an important role. Because skin is not sterile, your skin needs to be as free of germs as possible. You can reduce the number of germs on your skin by washing with CHG (chlorahexidine gluconate) Soap before surgery.  CHG is an antiseptic cleaner which kills  germs and bonds with the skin to continue killing germs even after washing.  Please do not use if you have an allergy to CHG or antibacterial soaps. If your skin becomes reddened/irritated stop using the CHG.  Do not shave (including legs and underarms) for at least 48 hours prior to first CHG shower. It is OK to shave your face.  Please follow these instructions carefully.   1. Shower the NIGHT BEFORE SURGERY and the MORNING OF SURGERY with CHG.   2. If you chose to wash your hair, wash your hair first as usual with your normal shampoo.  3. After you shampoo, rinse your hair and body thoroughly to remove the shampoo.  4. Use CHG as you would any other liquid soap. You can apply CHG directly to the skin and wash gently with a scrungie or a clean washcloth.   5. Apply the CHG Soap to your body ONLY FROM THE NECK DOWN.  Do not use on open wounds or open sores. Avoid contact with your eyes, ears, mouth and genitals (private parts). Wash Face and genitals (private parts)  with your normal soap.  6. Wash thoroughly, paying special attention to the area where your surgery will be performed.  7. Thoroughly rinse your body with warm water from the neck down.  8. DO NOT shower/wash with your normal soap after using and rinsing off the CHG Soap.  9. Pat yourself dry with a CLEAN TOWEL.  10. Wear CLEAN PAJAMAS to bed the night  before surgery, wear comfortable clothes the morning of surgery  11. Place CLEAN SHEETS on your bed the night of your first shower and DO NOT SLEEP WITH PETS.    Day of Surgery: Shower as stated above Do not apply any deodorants/lotions.  Please wear clean clothes to the hospital/surgery center.      Please read over the following fact sheets that you were given. Pain Booklet, Coughing and Deep Breathing, MRSA Information and Surgical Site Infection Prevention

## 2017-07-03 NOTE — Progress Notes (Signed)
PCP - Dr. Maudie Mercury Cardiologist - Dr. Debara Pickett  Chest x-ray - 07/03/2017 EKG - 06/25/2017 Stress Test - patient denies ECHO - 05/15/2017 Cardiac Cath - 06/25/2017  Sleep Study - patient denies   Patient denies shortness of breath, fever, cough and chest pain at PAT appointment   Patient verbalized understanding of instructions that were given to them at the PAT appointment. Patient was also instructed that they will need to review over the PAT instructions again at home before surgery.

## 2017-07-04 ENCOUNTER — Inpatient Hospital Stay (HOSPITAL_COMMUNITY): Payer: Medicare Other

## 2017-07-04 ENCOUNTER — Encounter (HOSPITAL_COMMUNITY): Payer: Self-pay | Admitting: *Deleted

## 2017-07-04 ENCOUNTER — Inpatient Hospital Stay (HOSPITAL_COMMUNITY)
Admission: RE | Admit: 2017-07-04 | Discharge: 2017-07-10 | DRG: 220 | Disposition: A | Discharge status: Home Health | Payer: Medicare Other | Source: Ambulatory Visit | Attending: Cardiothoracic Surgery | Admitting: Cardiothoracic Surgery

## 2017-07-04 ENCOUNTER — Inpatient Hospital Stay (HOSPITAL_COMMUNITY): Payer: Medicare Other | Admitting: Vascular Surgery

## 2017-07-04 ENCOUNTER — Encounter (HOSPITAL_COMMUNITY)
Admission: RE | Disposition: A | Discharge status: Home Health | Payer: Self-pay | Source: Ambulatory Visit | Attending: Cardiothoracic Surgery

## 2017-07-04 ENCOUNTER — Inpatient Hospital Stay (HOSPITAL_COMMUNITY): Payer: Medicare Other | Admitting: Anesthesiology

## 2017-07-04 DIAGNOSIS — Z79899 Other long term (current) drug therapy: Secondary | ICD-10-CM | POA: Diagnosis not present

## 2017-07-04 DIAGNOSIS — F17210 Nicotine dependence, cigarettes, uncomplicated: Secondary | ICD-10-CM | POA: Diagnosis present

## 2017-07-04 DIAGNOSIS — I5032 Chronic diastolic (congestive) heart failure: Secondary | ICD-10-CM | POA: Diagnosis present

## 2017-07-04 DIAGNOSIS — D62 Acute posthemorrhagic anemia: Secondary | ICD-10-CM | POA: Diagnosis not present

## 2017-07-04 DIAGNOSIS — I25119 Atherosclerotic heart disease of native coronary artery with unspecified angina pectoris: Secondary | ICD-10-CM | POA: Diagnosis present

## 2017-07-04 DIAGNOSIS — I77810 Thoracic aortic ectasia: Secondary | ICD-10-CM | POA: Diagnosis present

## 2017-07-04 DIAGNOSIS — Z952 Presence of prosthetic heart valve: Secondary | ICD-10-CM

## 2017-07-04 DIAGNOSIS — I739 Peripheral vascular disease, unspecified: Secondary | ICD-10-CM | POA: Diagnosis present

## 2017-07-04 DIAGNOSIS — R569 Unspecified convulsions: Secondary | ICD-10-CM | POA: Diagnosis present

## 2017-07-04 DIAGNOSIS — J9811 Atelectasis: Secondary | ICD-10-CM | POA: Diagnosis not present

## 2017-07-04 DIAGNOSIS — N179 Acute kidney failure, unspecified: Secondary | ICD-10-CM | POA: Diagnosis not present

## 2017-07-04 DIAGNOSIS — J90 Pleural effusion, not elsewhere classified: Secondary | ICD-10-CM | POA: Diagnosis not present

## 2017-07-04 DIAGNOSIS — R7303 Prediabetes: Secondary | ICD-10-CM | POA: Diagnosis present

## 2017-07-04 DIAGNOSIS — R40241 Glasgow coma scale score 13-15, unspecified time: Secondary | ICD-10-CM | POA: Diagnosis present

## 2017-07-04 DIAGNOSIS — I358 Other nonrheumatic aortic valve disorders: Secondary | ICD-10-CM | POA: Diagnosis not present

## 2017-07-04 DIAGNOSIS — J439 Emphysema, unspecified: Secondary | ICD-10-CM | POA: Diagnosis present

## 2017-07-04 DIAGNOSIS — I2510 Atherosclerotic heart disease of native coronary artery without angina pectoris: Secondary | ICD-10-CM | POA: Diagnosis not present

## 2017-07-04 DIAGNOSIS — I350 Nonrheumatic aortic (valve) stenosis: Principal | ICD-10-CM | POA: Diagnosis present

## 2017-07-04 DIAGNOSIS — Z953 Presence of xenogenic heart valve: Secondary | ICD-10-CM

## 2017-07-04 DIAGNOSIS — R918 Other nonspecific abnormal finding of lung field: Secondary | ICD-10-CM | POA: Diagnosis present

## 2017-07-04 DIAGNOSIS — I25810 Atherosclerosis of coronary artery bypass graft(s) without angina pectoris: Secondary | ICD-10-CM | POA: Diagnosis not present

## 2017-07-04 DIAGNOSIS — Z9689 Presence of other specified functional implants: Secondary | ICD-10-CM

## 2017-07-04 DIAGNOSIS — R55 Syncope and collapse: Secondary | ICD-10-CM | POA: Diagnosis present

## 2017-07-04 DIAGNOSIS — I440 Atrioventricular block, first degree: Secondary | ICD-10-CM | POA: Diagnosis present

## 2017-07-04 DIAGNOSIS — Z23 Encounter for immunization: Secondary | ICD-10-CM | POA: Diagnosis not present

## 2017-07-04 DIAGNOSIS — Z4682 Encounter for fitting and adjustment of non-vascular catheter: Secondary | ICD-10-CM | POA: Diagnosis not present

## 2017-07-04 DIAGNOSIS — I083 Combined rheumatic disorders of mitral, aortic and tricuspid valves: Secondary | ICD-10-CM | POA: Diagnosis not present

## 2017-07-04 DIAGNOSIS — Z951 Presence of aortocoronary bypass graft: Secondary | ICD-10-CM

## 2017-07-04 DIAGNOSIS — J810 Acute pulmonary edema: Secondary | ICD-10-CM | POA: Diagnosis not present

## 2017-07-04 DIAGNOSIS — I371 Nonrheumatic pulmonary valve insufficiency: Secondary | ICD-10-CM | POA: Diagnosis not present

## 2017-07-04 DIAGNOSIS — Z09 Encounter for follow-up examination after completed treatment for conditions other than malignant neoplasm: Secondary | ICD-10-CM

## 2017-07-04 HISTORY — PX: CORONARY ARTERY BYPASS GRAFT: SHX141

## 2017-07-04 HISTORY — PX: AORTIC VALVE REPLACEMENT: SHX41

## 2017-07-04 HISTORY — PX: TEE WITHOUT CARDIOVERSION: SHX5443

## 2017-07-04 LAB — POCT I-STAT, CHEM 8
BUN: 32 mg/dL — ABNORMAL HIGH (ref 6–20)
BUN: 21 mg/dL — ABNORMAL HIGH (ref 6–20)
BUN: 19 mg/dL (ref 6–20)
BUN: 19 mg/dL (ref 6–20)
BUN: 19 mg/dL (ref 6–20)
BUN: 20 mg/dL (ref 6–20)
BUN: 18 mg/dL (ref 6–20)
Calcium, Ion: 1.17 mmol/L (ref 1.15–1.40)
Calcium, Ion: 1.24 mmol/L (ref 1.15–1.40)
Calcium, Ion: 1.12 mmol/L — ABNORMAL LOW (ref 1.15–1.40)
Calcium, Ion: 1.09 mmol/L — ABNORMAL LOW (ref 1.15–1.40)
Calcium, Ion: 1.13 mmol/L — ABNORMAL LOW (ref 1.15–1.40)
Calcium, Ion: 1.09 mmol/L — ABNORMAL LOW (ref 1.15–1.40)
Calcium, Ion: 1.14 mmol/L — ABNORMAL LOW (ref 1.15–1.40)
Chloride: 105 mmol/L (ref 101–111)
Chloride: 104 mmol/L (ref 101–111)
Chloride: 103 mmol/L (ref 101–111)
Chloride: 101 mmol/L (ref 101–111)
Chloride: 103 mmol/L (ref 101–111)
Chloride: 105 mmol/L (ref 101–111)
Chloride: 105 mmol/L (ref 101–111)
Creatinine, Ser: 1 mg/dL (ref 0.61–1.24)
Creatinine, Ser: 0.9 mg/dL (ref 0.61–1.24)
Creatinine, Ser: 0.9 mg/dL (ref 0.61–1.24)
Creatinine, Ser: 0.8 mg/dL (ref 0.61–1.24)
Creatinine, Ser: 0.8 mg/dL (ref 0.61–1.24)
Creatinine, Ser: 0.8 mg/dL (ref 0.61–1.24)
Creatinine, Ser: 0.8 mg/dL (ref 0.61–1.24)
Glucose, Bld: 106 mg/dL — ABNORMAL HIGH (ref 65–99)
Glucose, Bld: 128 mg/dL — ABNORMAL HIGH (ref 65–99)
Glucose, Bld: 139 mg/dL — ABNORMAL HIGH (ref 65–99)
Glucose, Bld: 165 mg/dL — ABNORMAL HIGH (ref 65–99)
Glucose, Bld: 176 mg/dL — ABNORMAL HIGH (ref 65–99)
Glucose, Bld: 156 mg/dL — ABNORMAL HIGH (ref 65–99)
Glucose, Bld: 133 mg/dL — ABNORMAL HIGH (ref 65–99)
HCT: 27 % — ABNORMAL LOW (ref 39.0–52.0)
HCT: 26 % — ABNORMAL LOW (ref 39.0–52.0)
HCT: 26 % — ABNORMAL LOW (ref 39.0–52.0)
HCT: 22 % — ABNORMAL LOW (ref 39.0–52.0)
HCT: 23 % — ABNORMAL LOW (ref 39.0–52.0)
HCT: 23 % — ABNORMAL LOW (ref 39.0–52.0)
HCT: 26 % — ABNORMAL LOW (ref 39.0–52.0)
Hemoglobin: 9.2 g/dL — ABNORMAL LOW (ref 13.0–17.0)
Hemoglobin: 8.8 g/dL — ABNORMAL LOW (ref 13.0–17.0)
Hemoglobin: 8.8 g/dL — ABNORMAL LOW (ref 13.0–17.0)
Hemoglobin: 7.5 g/dL — ABNORMAL LOW (ref 13.0–17.0)
Hemoglobin: 7.8 g/dL — ABNORMAL LOW (ref 13.0–17.0)
Hemoglobin: 7.8 g/dL — ABNORMAL LOW (ref 13.0–17.0)
Hemoglobin: 8.8 g/dL — ABNORMAL LOW (ref 13.0–17.0)
Potassium: 6 mmol/L — ABNORMAL HIGH (ref 3.5–5.1)
Potassium: 4.1 mmol/L (ref 3.5–5.1)
Potassium: 4.6 mmol/L (ref 3.5–5.1)
Potassium: 4.3 mmol/L (ref 3.5–5.1)
Potassium: 3.9 mmol/L (ref 3.5–5.1)
Potassium: 4.5 mmol/L (ref 3.5–5.1)
Potassium: 3.5 mmol/L (ref 3.5–5.1)
Sodium: 138 mmol/L (ref 135–145)
Sodium: 140 mmol/L (ref 135–145)
Sodium: 140 mmol/L (ref 135–145)
Sodium: 137 mmol/L (ref 135–145)
Sodium: 138 mmol/L (ref 135–145)
Sodium: 139 mmol/L (ref 135–145)
Sodium: 142 mmol/L (ref 135–145)
TCO2: 28 mmol/L (ref 22–32)
TCO2: 28 mmol/L (ref 22–32)
TCO2: 26 mmol/L (ref 22–32)
TCO2: 26 mmol/L (ref 22–32)
TCO2: 25 mmol/L (ref 22–32)
TCO2: 24 mmol/L (ref 22–32)
TCO2: 26 mmol/L (ref 22–32)

## 2017-07-04 LAB — POCT I-STAT 3, ART BLOOD GAS (G3+)
Acid-Base Excess: 2 mmol/L (ref 0.0–2.0)
Acid-base deficit: 2 mmol/L (ref 0.0–2.0)
Acid-base deficit: 2 mmol/L (ref 0.0–2.0)
Acid-base deficit: 2 mmol/L (ref 0.0–2.0)
Acid-base deficit: 3 mmol/L — ABNORMAL HIGH (ref 0.0–2.0)
Acid-base deficit: 3 mmol/L — ABNORMAL HIGH (ref 0.0–2.0)
Bicarbonate: 27.9 mmol/L (ref 20.0–28.0)
Bicarbonate: 25.3 mmol/L (ref 20.0–28.0)
Bicarbonate: 24 mmol/L (ref 20.0–28.0)
Bicarbonate: 24.6 mmol/L (ref 20.0–28.0)
Bicarbonate: 22.6 mmol/L (ref 20.0–28.0)
Bicarbonate: 22.3 mmol/L (ref 20.0–28.0)
O2 Saturation: 100 %
O2 Saturation: 100 %
O2 Saturation: 88 %
O2 Saturation: 96 %
O2 Saturation: 96 %
O2 Saturation: 97 %
Patient temperature: 36.1
Patient temperature: 36.4
Patient temperature: 36.7
Patient temperature: 37
TCO2: 29 mmol/L (ref 22–32)
TCO2: 27 mmol/L (ref 22–32)
TCO2: 26 mmol/L (ref 22–32)
TCO2: 26 mmol/L (ref 22–32)
TCO2: 24 mmol/L (ref 22–32)
TCO2: 24 mmol/L (ref 22–32)
pCO2 arterial: 50.6 mmHg — ABNORMAL HIGH (ref 32.0–48.0)
pCO2 arterial: 56.4 mmHg — ABNORMAL HIGH (ref 32.0–48.0)
pCO2 arterial: 47.8 mmHg (ref 32.0–48.0)
pCO2 arterial: 51.6 mmHg — ABNORMAL HIGH (ref 32.0–48.0)
pCO2 arterial: 41.8 mmHg (ref 32.0–48.0)
pCO2 arterial: 41 mmHg (ref 32.0–48.0)
pH, Arterial: 7.348 — ABNORMAL LOW (ref 7.350–7.450)
pH, Arterial: 7.26 — ABNORMAL LOW (ref 7.350–7.450)
pH, Arterial: 7.305 — ABNORMAL LOW (ref 7.350–7.450)
pH, Arterial: 7.284 — ABNORMAL LOW (ref 7.350–7.450)
pH, Arterial: 7.34 — ABNORMAL LOW (ref 7.350–7.450)
pH, Arterial: 7.343 — ABNORMAL LOW (ref 7.350–7.450)
pO2, Arterial: 399 mmHg — ABNORMAL HIGH (ref 83.0–108.0)
pO2, Arterial: 415 mmHg — ABNORMAL HIGH (ref 83.0–108.0)
pO2, Arterial: 58 mmHg — ABNORMAL LOW (ref 83.0–108.0)
pO2, Arterial: 89 mmHg (ref 83.0–108.0)
pO2, Arterial: 90 mmHg (ref 83.0–108.0)
pO2, Arterial: 91 mmHg (ref 83.0–108.0)

## 2017-07-04 LAB — CBC
HCT: 24.1 % — ABNORMAL LOW (ref 39.0–52.0)
HCT: 24.2 % — ABNORMAL LOW (ref 39.0–52.0)
Hemoglobin: 7.7 g/dL — ABNORMAL LOW (ref 13.0–17.0)
Hemoglobin: 7.9 g/dL — ABNORMAL LOW (ref 13.0–17.0)
MCH: 25.8 pg — AB (ref 26.0–34.0)
MCH: 26.2 pg (ref 26.0–34.0)
MCHC: 32 g/dL (ref 30.0–36.0)
MCHC: 32.6 g/dL (ref 30.0–36.0)
MCV: 80.9 fL (ref 78.0–100.0)
MCV: 80.4 fL (ref 78.0–100.0)
PLATELETS: 162 10*3/uL (ref 150–400)
Platelets: 180 10*3/uL (ref 150–400)
RBC: 2.98 MIL/uL — ABNORMAL LOW (ref 4.22–5.81)
RBC: 3.01 MIL/uL — ABNORMAL LOW (ref 4.22–5.81)
RDW: 13.4 % (ref 11.5–15.5)
RDW: 13.8 % (ref 11.5–15.5)
WBC: 14.5 10*3/uL — ABNORMAL HIGH (ref 4.0–10.5)
WBC: 12 10*3/uL — ABNORMAL HIGH (ref 4.0–10.5)

## 2017-07-04 LAB — GLUCOSE, CAPILLARY
Glucose-Capillary: 99 mg/dL (ref 65–99)
Glucose-Capillary: 124 mg/dL — ABNORMAL HIGH (ref 65–99)
Glucose-Capillary: 130 mg/dL — ABNORMAL HIGH (ref 65–99)
Glucose-Capillary: 116 mg/dL — ABNORMAL HIGH (ref 65–99)
Glucose-Capillary: 107 mg/dL — ABNORMAL HIGH (ref 65–99)

## 2017-07-04 LAB — POCT I-STAT 4, (NA,K, GLUC, HGB,HCT)
Glucose, Bld: 86 mg/dL (ref 65–99)
HCT: 24 % — ABNORMAL LOW (ref 39.0–52.0)
Hemoglobin: 8.2 g/dL — ABNORMAL LOW (ref 13.0–17.0)
Potassium: 3.6 mmol/L (ref 3.5–5.1)
Sodium: 144 mmol/L (ref 135–145)

## 2017-07-04 LAB — PROTIME-INR
INR: 1.45
PROTHROMBIN TIME: 17.5 s — AB (ref 11.4–15.2)

## 2017-07-04 LAB — PREPARE RBC (CROSSMATCH)

## 2017-07-04 LAB — PLATELET COUNT: Platelets: 195 10*3/uL (ref 150–400)

## 2017-07-04 LAB — CREATININE, SERUM
Creatinine, Ser: 0.97 mg/dL (ref 0.61–1.24)
GFR calc Af Amer: >60 mL/min (ref >60)
GFR calc non Af Amer: >60 mL/min (ref >60)

## 2017-07-04 LAB — HEMOGLOBIN AND HEMATOCRIT, BLOOD
HCT: 23.1 % — ABNORMAL LOW (ref 39.0–52.0)
Hemoglobin: 7.5 g/dL — ABNORMAL LOW (ref 13.0–17.0)

## 2017-07-04 LAB — MAGNESIUM: Magnesium: 3.1 mg/dL — ABNORMAL HIGH (ref 1.7–2.4)

## 2017-07-04 LAB — APTT
APTT: 54 s — AB (ref 24–36)
aPTT: 51 seconds — ABNORMAL HIGH (ref 24–36)

## 2017-07-04 SURGERY — REPLACEMENT, AORTIC VALVE, OPEN
Anesthesia: General | Site: Chest

## 2017-07-04 MED ORDER — ALBUMIN HUMAN 5 % IV SOLN
INTRAVENOUS | Status: DC | PRN
  Administered 2017-07-04 (×3): via INTRAVENOUS

## 2017-07-04 MED ORDER — FAMOTIDINE IN NACL 20-0.9 MG/50ML-% IV SOLN
20.0000 mg | Freq: Two times a day (BID) | INTRAVENOUS | Status: AC
  Administered 2017-07-04 – 2017-07-05 (×2): 20 mg via INTRAVENOUS
  Filled 2017-07-04: qty 50

## 2017-07-04 MED ORDER — INSULIN REGULAR HUMAN 100 UNIT/ML IJ SOLN
INTRAMUSCULAR | Status: DC | PRN
  Administered 2017-07-04: 1.4 [IU]/h via INTRAVENOUS
  Administered 2017-07-04: .9 [IU]/h via INTRAVENOUS

## 2017-07-04 MED ORDER — MIDAZOLAM HCL 10 MG/2ML IJ SOLN
INTRAMUSCULAR | Status: AC
  Filled 2017-07-04: qty 2

## 2017-07-04 MED ORDER — CHLORHEXIDINE GLUCONATE 4 % EX LIQD: 30.0000 mL | CUTANEOUS | Status: DC

## 2017-07-04 MED ORDER — FENTANYL CITRATE (PF) 250 MCG/5ML IJ SOLN
INTRAMUSCULAR | Status: DC | PRN
  Administered 2017-07-04: 150 ug via INTRAVENOUS
  Administered 2017-07-04: 250 ug via INTRAVENOUS
  Administered 2017-07-04: 100 ug via INTRAVENOUS
  Administered 2017-07-04: 50 ug via INTRAVENOUS
  Administered 2017-07-04: 100 ug via INTRAVENOUS
  Administered 2017-07-04: 150 ug via INTRAVENOUS
  Administered 2017-07-04: 50 ug via INTRAVENOUS
  Administered 2017-07-04: 250 ug via INTRAVENOUS
  Administered 2017-07-04: 150 ug via INTRAVENOUS

## 2017-07-04 MED ORDER — SODIUM CHLORIDE 0.9% FLUSH: 10.0000 mL | INTRAVENOUS | Status: DC | PRN

## 2017-07-04 MED ORDER — DOPAMINE-DEXTROSE 3.2-5 MG/ML-% IV SOLN: 0.0000 ug/kg/min | INTRAVENOUS | Status: DC

## 2017-07-04 MED ORDER — SODIUM CHLORIDE 0.9% FLUSH
3.0000 mL | Freq: Two times a day (BID) | INTRAVENOUS | Status: DC
  Administered 2017-07-05 – 2017-07-10 (×10): 3 mL via INTRAVENOUS

## 2017-07-04 MED ORDER — DOCUSATE SODIUM 100 MG PO CAPS
200.0000 mg | ORAL_CAPSULE | Freq: Every day | ORAL | Status: DC
  Administered 2017-07-05 – 2017-07-10 (×6): 200 mg via ORAL
  Filled 2017-07-04 (×6): qty 2

## 2017-07-04 MED ORDER — VANCOMYCIN HCL IN DEXTROSE 1-5 GM/200ML-% IV SOLN
1000.0000 mg | Freq: Once | INTRAVENOUS | Status: AC
  Administered 2017-07-04: 1000 mg via INTRAVENOUS
  Filled 2017-07-04: qty 200

## 2017-07-04 MED ORDER — 0.9 % SODIUM CHLORIDE (POUR BTL) OPTIME
TOPICAL | Status: DC | PRN
Start: 2017-07-04 — End: 2017-07-04
  Administered 2017-07-04: 6000 mL

## 2017-07-04 MED ORDER — METOPROLOL TARTRATE 25 MG/10 ML ORAL SUSPENSION: 12.5000 mg | Freq: Two times a day (BID) | ORAL | Status: DC

## 2017-07-04 MED ORDER — LACTATED RINGERS IV SOLN: INTRAVENOUS | Status: DC

## 2017-07-04 MED ORDER — ALBUMIN HUMAN 5 % IV SOLN
250.0000 mL | INTRAVENOUS | Status: DC | PRN
  Administered 2017-07-04 (×2): 250 mL via INTRAVENOUS

## 2017-07-04 MED ORDER — MORPHINE SULFATE (PF) 4 MG/ML IV SOLN
2.0000 mg | INTRAVENOUS | Status: DC | PRN
  Administered 2017-07-05 (×3): 2 mg via INTRAVENOUS
  Administered 2017-07-05: 4 mg via INTRAVENOUS
  Filled 2017-07-04 (×2): qty 1

## 2017-07-04 MED ORDER — PROTAMINE SULFATE 10 MG/ML IV SOLN
INTRAVENOUS | Status: AC
  Filled 2017-07-04: qty 20

## 2017-07-04 MED ORDER — ONDANSETRON HCL 4 MG/2ML IJ SOLN
4.0000 mg | Freq: Four times a day (QID) | INTRAMUSCULAR | Status: DC | PRN
Start: 2017-07-04 — End: 2017-07-10
  Administered 2017-07-05: 4 mg via INTRAVENOUS
  Filled 2017-07-04: qty 2

## 2017-07-04 MED ORDER — LACTATED RINGERS IV SOLN: 500.0000 mL | Freq: Once | INTRAVENOUS | Status: DC | PRN

## 2017-07-04 MED ORDER — OXYCODONE HCL 5 MG PO TABS
5.0000 mg | ORAL_TABLET | ORAL | Status: DC | PRN
  Administered 2017-07-05 – 2017-07-08 (×4): 10 mg via ORAL
  Administered 2017-07-09: 5 mg via ORAL
  Administered 2017-07-10 (×2): 10 mg via ORAL
  Filled 2017-07-04: qty 2
  Filled 2017-07-04: qty 1
  Filled 2017-07-04 (×6): qty 2

## 2017-07-04 MED ORDER — ASPIRIN 81 MG PO CHEW: 324.0000 mg | CHEWABLE_TABLET | Freq: Every day | ORAL | Status: DC

## 2017-07-04 MED ORDER — ACETAMINOPHEN 650 MG RE SUPP
650.0000 mg | Freq: Once | RECTAL | Status: AC
  Administered 2017-07-04: 650 mg via RECTAL

## 2017-07-04 MED ORDER — PROPOFOL 10 MG/ML IV BOLUS
INTRAVENOUS | Status: AC
  Filled 2017-07-04: qty 20

## 2017-07-04 MED ORDER — MORPHINE SULFATE (PF) 2 MG/ML IV SOLN: 1.0000 mg | INTRAVENOUS | Status: DC | PRN

## 2017-07-04 MED ORDER — ASPIRIN EC 325 MG PO TBEC
325.0000 mg | DELAYED_RELEASE_TABLET | Freq: Every day | ORAL | Status: DC
  Administered 2017-07-05 – 2017-07-10 (×6): 325 mg via ORAL
  Filled 2017-07-04 (×6): qty 1

## 2017-07-04 MED ORDER — SUCCINYLCHOLINE CHLORIDE 200 MG/10ML IV SOSY
PREFILLED_SYRINGE | INTRAVENOUS | Status: AC
  Filled 2017-07-04: qty 10

## 2017-07-04 MED ORDER — ROCURONIUM BROMIDE 100 MG/10ML IV SOLN
INTRAVENOUS | Status: DC | PRN
  Administered 2017-07-04 (×3): 50 mg via INTRAVENOUS

## 2017-07-04 MED ORDER — ACETAMINOPHEN 160 MG/5ML PO SOLN: 650.0000 mg | Freq: Once | ORAL | Status: AC

## 2017-07-04 MED ORDER — PROTAMINE SULFATE 10 MG/ML IV SOLN
INTRAVENOUS | Status: DC | PRN
  Administered 2017-07-04: 10 mg via INTRAVENOUS
  Administered 2017-07-04: 230 mg via INTRAVENOUS

## 2017-07-04 MED ORDER — FENTANYL CITRATE (PF) 250 MCG/5ML IJ SOLN
INTRAMUSCULAR | Status: AC
Start: 2017-07-04 — End: ?
  Filled 2017-07-04: qty 30

## 2017-07-04 MED ORDER — MORPHINE SULFATE (PF) 2 MG/ML IV SOLN: 2.0000 mg | INTRAVENOUS | Status: DC | PRN

## 2017-07-04 MED ORDER — MIDAZOLAM HCL 2 MG/2ML IJ SOLN
2.0000 mg | INTRAMUSCULAR | Status: DC | PRN
  Filled 2017-07-04: qty 2

## 2017-07-04 MED ORDER — ORAL CARE MOUTH RINSE
15.0000 mL | Freq: Four times a day (QID) | OROMUCOSAL | Status: DC
  Administered 2017-07-04 – 2017-07-05 (×2): 15 mL via OROMUCOSAL

## 2017-07-04 MED ORDER — CHLORHEXIDINE GLUCONATE CLOTH 2 % EX PADS
6.0000 | MEDICATED_PAD | Freq: Every day | CUTANEOUS | Status: DC
  Administered 2017-07-04 – 2017-07-06 (×3): 6 via TOPICAL

## 2017-07-04 MED ORDER — CHLORHEXIDINE GLUCONATE 0.12 % MT SOLN
15.0000 mL | OROMUCOSAL | Status: AC
  Administered 2017-07-04: 15 mL via OROMUCOSAL

## 2017-07-04 MED ORDER — HEPARIN SODIUM (PORCINE) 1000 UNIT/ML IJ SOLN
INTRAMUSCULAR | Status: DC | PRN
  Administered 2017-07-04: 24 mL via INTRAVENOUS

## 2017-07-04 MED ORDER — SODIUM CHLORIDE 0.9 % IV SOLN
INTRAVENOUS | Status: DC
  Filled 2017-07-04: qty 1

## 2017-07-04 MED ORDER — SODIUM CHLORIDE 0.9 % IV SOLN: 250.0000 mL | INTRAVENOUS | Status: DC

## 2017-07-04 MED ORDER — POTASSIUM CHLORIDE 10 MEQ/50ML IV SOLN
10.0000 meq | INTRAVENOUS | Status: AC
  Administered 2017-07-04 (×3): 10 meq via INTRAVENOUS

## 2017-07-04 MED ORDER — LEVALBUTEROL HCL 0.63 MG/3ML IN NEBU
0.6300 mg | INHALATION_SOLUTION | Freq: Three times a day (TID) | RESPIRATORY_TRACT | Status: DC
  Administered 2017-07-04 – 2017-07-10 (×18): 0.63 mg via RESPIRATORY_TRACT
  Filled 2017-07-04 (×18): qty 3

## 2017-07-04 MED ORDER — SODIUM CHLORIDE 0.9 % IV SOLN
0.0000 ug/kg/h | INTRAVENOUS | Status: DC
  Filled 2017-07-04 (×2): qty 2

## 2017-07-04 MED ORDER — EPHEDRINE 5 MG/ML INJ
INTRAVENOUS | Status: AC
  Filled 2017-07-04: qty 10

## 2017-07-04 MED ORDER — BISACODYL 10 MG RE SUPP: 10.0000 mg | Freq: Every day | RECTAL | Status: DC

## 2017-07-04 MED ORDER — LACTATED RINGERS IV SOLN
INTRAVENOUS | Status: DC | PRN
  Administered 2017-07-04: 07:00:00 via INTRAVENOUS

## 2017-07-04 MED ORDER — ACETAMINOPHEN 160 MG/5ML PO SOLN: 1000.0000 mg | Freq: Four times a day (QID) | ORAL | Status: DC

## 2017-07-04 MED ORDER — SODIUM CHLORIDE 0.9 % IV SOLN: INTRAVENOUS | Status: DC

## 2017-07-04 MED ORDER — MAGNESIUM SULFATE 4 GM/100ML IV SOLN
4.0000 g | Freq: Once | INTRAVENOUS | Status: AC
  Administered 2017-07-04: 4 g via INTRAVENOUS
  Filled 2017-07-04: qty 100

## 2017-07-04 MED ORDER — INSULIN ASPART 100 UNIT/ML ~~LOC~~ SOLN
0.0000 [IU] | SUBCUTANEOUS | Status: DC
  Administered 2017-07-05 (×4): 2 [IU] via SUBCUTANEOUS

## 2017-07-04 MED ORDER — DEXMEDETOMIDINE HCL 200 MCG/2ML IV SOLN
INTRAVENOUS | Status: DC | PRN
  Administered 2017-07-04: 0.2 ug/kg/h via INTRAVENOUS

## 2017-07-04 MED ORDER — SODIUM CHLORIDE 0.9% FLUSH
10.0000 mL | Freq: Two times a day (BID) | INTRAVENOUS | Status: DC
  Administered 2017-07-05 (×2): 10 mL
  Administered 2017-07-06: 20 mL

## 2017-07-04 MED ORDER — INSULIN REGULAR BOLUS VIA INFUSION
0.0000 [IU] | Freq: Three times a day (TID) | INTRAVENOUS | Status: DC
  Filled 2017-07-04: qty 10

## 2017-07-04 MED ORDER — SODIUM CHLORIDE 0.9 % IV SOLN
0.0000 ug/min | INTRAVENOUS | Status: DC
  Administered 2017-07-05: 25 ug/min via INTRAVENOUS
  Administered 2017-07-05: 20 ug/min via INTRAVENOUS
  Filled 2017-07-04 (×4): qty 2

## 2017-07-04 MED ORDER — ALBUTEROL SULFATE HFA 108 (90 BASE) MCG/ACT IN AERS
INHALATION_SPRAY | RESPIRATORY_TRACT | Status: AC
  Filled 2017-07-04: qty 6.7

## 2017-07-04 MED ORDER — CHLORHEXIDINE GLUCONATE 0.12% ORAL RINSE (MEDLINE KIT)
15.0000 mL | Freq: Two times a day (BID) | OROMUCOSAL | Status: DC
  Administered 2017-07-04 – 2017-07-06 (×4): 15 mL via OROMUCOSAL

## 2017-07-04 MED ORDER — LEVETIRACETAM 500 MG PO TABS
500.0000 mg | ORAL_TABLET | Freq: Two times a day (BID) | ORAL | Status: DC
  Administered 2017-07-05 – 2017-07-10 (×11): 500 mg via ORAL
  Filled 2017-07-04: qty 2
  Filled 2017-07-04: qty 1
  Filled 2017-07-04 (×2): qty 2
  Filled 2017-07-04 (×3): qty 1
  Filled 2017-07-04: qty 2
  Filled 2017-07-04 (×9): qty 1

## 2017-07-04 MED ORDER — SODIUM CHLORIDE 0.9% FLUSH: 3.0000 mL | INTRAVENOUS | Status: DC | PRN

## 2017-07-04 MED ORDER — METOPROLOL TARTRATE 12.5 MG HALF TABLET
12.5000 mg | ORAL_TABLET | Freq: Two times a day (BID) | ORAL | Status: DC
  Administered 2017-07-05 – 2017-07-10 (×9): 12.5 mg via ORAL
  Filled 2017-07-04 (×9): qty 1

## 2017-07-04 MED ORDER — ACETAMINOPHEN 500 MG PO TABS
1000.0000 mg | ORAL_TABLET | Freq: Four times a day (QID) | ORAL | Status: AC
  Administered 2017-07-05 – 2017-07-09 (×17): 1000 mg via ORAL
  Filled 2017-07-04 (×17): qty 2

## 2017-07-04 MED ORDER — SODIUM CHLORIDE 0.9 % IJ SOLN
INTRAMUSCULAR | Status: DC | PRN
  Administered 2017-07-04 (×3): 4 mL via TOPICAL

## 2017-07-04 MED ORDER — PANTOPRAZOLE SODIUM 40 MG PO TBEC
40.0000 mg | DELAYED_RELEASE_TABLET | Freq: Every day | ORAL | Status: DC
  Administered 2017-07-06 – 2017-07-10 (×5): 40 mg via ORAL
  Filled 2017-07-04 (×5): qty 1

## 2017-07-04 MED ORDER — DEXTROSE 5 % IV SOLN
INTRAVENOUS | Status: DC | PRN
  Administered 2017-07-04: 20 ug/min via INTRAVENOUS
  Administered 2017-07-04: 40 ug/min via INTRAVENOUS

## 2017-07-04 MED ORDER — MORPHINE SULFATE (PF) 4 MG/ML IV SOLN
1.0000 mg | INTRAVENOUS | Status: DC | PRN
  Administered 2017-07-04: 2 mg via INTRAVENOUS
  Filled 2017-07-04 (×3): qty 1

## 2017-07-04 MED ORDER — THROMBIN 5000 UNITS EX SOLR
CUTANEOUS | Status: DC | PRN
  Administered 2017-07-04: 5000 [IU] via TOPICAL

## 2017-07-04 MED ORDER — SODIUM CHLORIDE 0.45 % IV SOLN
INTRAVENOUS | Status: DC | PRN
  Administered 2017-07-04: 16:00:00 via INTRAVENOUS

## 2017-07-04 MED ORDER — THROMBIN (RECOMBINANT) 5000 UNITS EX SOLR
CUTANEOUS | Status: AC
  Filled 2017-07-04: qty 5000

## 2017-07-04 MED ORDER — LACTATED RINGERS IV SOLN
INTRAVENOUS | Status: DC | PRN
  Administered 2017-07-04 (×2): via INTRAVENOUS

## 2017-07-04 MED ORDER — ALBUTEROL SULFATE HFA 108 (90 BASE) MCG/ACT IN AERS
INHALATION_SPRAY | RESPIRATORY_TRACT | Status: DC | PRN
  Administered 2017-07-04 (×2): 4 via RESPIRATORY_TRACT

## 2017-07-04 MED ORDER — MIDAZOLAM HCL 5 MG/5ML IJ SOLN
INTRAMUSCULAR | Status: DC | PRN
  Administered 2017-07-04: 3 mg via INTRAVENOUS
  Administered 2017-07-04: 1 mg via INTRAVENOUS
  Administered 2017-07-04 (×3): 3 mg via INTRAVENOUS

## 2017-07-04 MED ORDER — SODIUM CHLORIDE 0.9 % IV SOLN
INTRAVENOUS | Status: DC | PRN
  Administered 2017-07-04: 15:00:00 via INTRAVENOUS

## 2017-07-04 MED ORDER — BISACODYL 5 MG PO TBEC
10.0000 mg | DELAYED_RELEASE_TABLET | Freq: Every day | ORAL | Status: DC
  Administered 2017-07-05 – 2017-07-10 (×6): 10 mg via ORAL
  Filled 2017-07-04 (×6): qty 2

## 2017-07-04 MED ORDER — LACTATED RINGERS IV SOLN
INTRAVENOUS | Status: DC | PRN
  Administered 2017-07-04 (×2): via INTRAVENOUS

## 2017-07-04 MED ORDER — ETOMIDATE 2 MG/ML IV SOLN
INTRAVENOUS | Status: DC | PRN
  Administered 2017-07-04: 12 mg via INTRAVENOUS

## 2017-07-04 MED ORDER — LEVALBUTEROL HCL 0.63 MG/3ML IN NEBU: 0.6300 mg | INHALATION_SOLUTION | Freq: Three times a day (TID) | RESPIRATORY_TRACT | Status: DC

## 2017-07-04 MED ORDER — HEMOSTATIC AGENTS (NO CHARGE) OPTIME
TOPICAL | Status: DC | PRN
  Administered 2017-07-04 (×2): 1 via TOPICAL

## 2017-07-04 MED ORDER — TRAMADOL HCL 50 MG PO TABS
50.0000 mg | ORAL_TABLET | ORAL | Status: DC | PRN
  Administered 2017-07-05 – 2017-07-10 (×2): 100 mg via ORAL
  Filled 2017-07-04 (×2): qty 2

## 2017-07-04 MED ORDER — DEXTROSE 5 % IV SOLN
1.5000 g | Freq: Two times a day (BID) | INTRAVENOUS | Status: AC
  Administered 2017-07-04 – 2017-07-06 (×4): 1.5 g via INTRAVENOUS
  Filled 2017-07-04 (×4): qty 1.5

## 2017-07-04 MED ORDER — LIDOCAINE 2% (20 MG/ML) 5 ML SYRINGE
INTRAMUSCULAR | Status: AC
  Filled 2017-07-04: qty 5

## 2017-07-04 MED ORDER — HEPARIN SODIUM (PORCINE) 1000 UNIT/ML IJ SOLN
INTRAMUSCULAR | Status: AC
  Filled 2017-07-04: qty 1

## 2017-07-04 MED ORDER — METOPROLOL TARTRATE 5 MG/5ML IV SOLN: 2.5000 mg | INTRAVENOUS | Status: DC | PRN

## 2017-07-04 MED ORDER — NITROGLYCERIN IN D5W 200-5 MCG/ML-% IV SOLN: 0.0000 ug/min | INTRAVENOUS | Status: DC

## 2017-07-04 SURGICAL SUPPLY — 108 items
ADAPTER CARDIO PERF ANTE/RETRO (ADAPTER) ×3 IMPLANT
APPLICATOR COTTON TIP 6IN STRL (MISCELLANEOUS) IMPLANT
APPLICATOR TIP COSEAL (VASCULAR PRODUCTS) IMPLANT
BAG DECANTER FOR FLEXI CONT (MISCELLANEOUS) ×3 IMPLANT
BANDAGE ACE 4X5 VEL STRL LF (GAUZE/BANDAGES/DRESSINGS) ×3 IMPLANT
BANDAGE ACE 6X5 VEL STRL LF (GAUZE/BANDAGES/DRESSINGS) ×3 IMPLANT
BLADE STERNUM SYSTEM 6 (BLADE) ×3 IMPLANT
BLADE SURG 11 STRL SS (BLADE) ×3 IMPLANT
BLADE SURG 15 STRL LF DISP TIS (BLADE) ×2 IMPLANT
BLADE SURG 15 STRL SS (BLADE) ×1
BNDG GAUZE ELAST 4 BULKY (GAUZE/BANDAGES/DRESSINGS) ×3 IMPLANT
BOOT SUTURE AID YELLOW STND (SUTURE) IMPLANT
CANISTER SUCT 3000ML PPV (MISCELLANEOUS) ×3 IMPLANT
CANNULA GUNDRY RCSP 15FR (MISCELLANEOUS) ×6 IMPLANT
CANNULA VEN 2 STAGE (MISCELLANEOUS) ×3 IMPLANT
CATH CPB KIT GERHARDT (MISCELLANEOUS) ×3 IMPLANT
CATH FOLEY 2WAY SLVR 18FR 30CC (CATHETERS) IMPLANT
CATH HEART VENT LEFT (CATHETERS) ×2 IMPLANT
CATH THORACIC 28FR (CATHETERS) ×3 IMPLANT
CATH/SQUID NICHOLS JEHLE COR (CATHETERS) IMPLANT
CLIP VESOCCLUDE MED 24/CT (CLIP) ×6 IMPLANT
CLIP VESOCCLUDE SM WIDE 24/CT (CLIP) ×9 IMPLANT
CONT SPEC 4OZ CLIKSEAL STRL BL (MISCELLANEOUS) ×3 IMPLANT
COVER MAYO STAND STRL (DRAPES) ×3 IMPLANT
CRADLE DONUT ADULT HEAD (MISCELLANEOUS) ×3 IMPLANT
DERMABOND ADVANCED (GAUZE/BANDAGES/DRESSINGS) ×1
DERMABOND ADVANCED .7 DNX12 (GAUZE/BANDAGES/DRESSINGS) ×2 IMPLANT
DRAIN CHANNEL 28F RND 3/8 FF (WOUND CARE) ×3 IMPLANT
DRAPE CARDIOVASCULAR INCISE (DRAPES) ×1
DRAPE SLUSH/WARMER DISC (DRAPES) ×3 IMPLANT
DRAPE SRG 135X102X78XABS (DRAPES) ×2 IMPLANT
DRSG AQUACEL AG ADV 3.5X14 (GAUZE/BANDAGES/DRESSINGS) ×3 IMPLANT
ELECT BLADE 4.0 EZ CLEAN MEGAD (MISCELLANEOUS) ×3
ELECT CAUTERY BLADE 6.4 (BLADE) ×3 IMPLANT
ELECT REM PT RETURN 9FT ADLT (ELECTROSURGICAL) ×6
ELECTRODE BLDE 4.0 EZ CLN MEGD (MISCELLANEOUS) ×2 IMPLANT
ELECTRODE REM PT RTRN 9FT ADLT (ELECTROSURGICAL) ×4 IMPLANT
FELT TEFLON 1X6 (MISCELLANEOUS) ×6 IMPLANT
GAUZE SPONGE 4X4 12PLY STRL (GAUZE/BANDAGES/DRESSINGS) ×6 IMPLANT
GLOVE BIO SURGEON STRL SZ 6.5 (GLOVE) ×36 IMPLANT
GOWN STRL REUS W/ TWL LRG LVL3 (GOWN DISPOSABLE) ×20 IMPLANT
GOWN STRL REUS W/TWL LRG LVL3 (GOWN DISPOSABLE) ×10
HEMOSTAT POWDER SURGIFOAM 1G (HEMOSTASIS) ×9 IMPLANT
HEMOSTAT SURGICEL 2X14 (HEMOSTASIS) ×3 IMPLANT
INSERT FOGARTY XLG (MISCELLANEOUS) IMPLANT
IV CATH 18G X1.75 CATHLON (IV SOLUTION) ×3 IMPLANT
KIT BASIN OR (CUSTOM PROCEDURE TRAY) ×3 IMPLANT
KIT CATH SUCT 8FR (CATHETERS) ×3 IMPLANT
KIT ROOM TURNOVER OR (KITS) ×3 IMPLANT
KIT SUCTION CATH 14FR (SUCTIONS) ×12 IMPLANT
KIT VASOVIEW HEMOPRO VH 3000 (KITS) ×3 IMPLANT
LEAD PACING MYOCARDI (MISCELLANEOUS) ×3 IMPLANT
LINE VENT (MISCELLANEOUS) ×3 IMPLANT
MARKER GRAFT CORONARY BYPASS (MISCELLANEOUS) ×9 IMPLANT
NS IRRIG 1000ML POUR BTL (IV SOLUTION) ×18 IMPLANT
PACK OPEN HEART (CUSTOM PROCEDURE TRAY) ×3 IMPLANT
PAD ARMBOARD 7.5X6 YLW CONV (MISCELLANEOUS) ×6 IMPLANT
PAD ELECT DEFIB RADIOL ZOLL (MISCELLANEOUS) ×3 IMPLANT
PENCIL BUTTON HOLSTER BLD 10FT (ELECTRODE) ×3 IMPLANT
PUNCH AORTIC ROT 4.0MM RCL 40 (MISCELLANEOUS) ×3 IMPLANT
SEALANT SURG COSEAL 8ML (VASCULAR PRODUCTS) ×3 IMPLANT
SET CARDIOPLEGIA MPS 5001102 (MISCELLANEOUS) ×3 IMPLANT
SPONGE LAP 18X18 X RAY DECT (DISPOSABLE) ×18 IMPLANT
SUT BONE WAX W31G (SUTURE) ×3 IMPLANT
SUT ETHIBON 2 0 V 52N 30 (SUTURE) ×6 IMPLANT
SUT ETHIBOND 2 0 SH (SUTURE) ×4
SUT ETHIBOND 2 0 SH 36X2 (SUTURE) ×8 IMPLANT
SUT MNCRL AB 4-0 PS2 18 (SUTURE) ×3 IMPLANT
SUT PROLENE 3 0 RB 1 (SUTURE) ×3 IMPLANT
SUT PROLENE 3 0 SH 48 (SUTURE) IMPLANT
SUT PROLENE 3 0 SH1 36 (SUTURE) ×6 IMPLANT
SUT PROLENE 4 0 RB 1 (SUTURE) ×6
SUT PROLENE 4 0 TF (SUTURE) ×6 IMPLANT
SUT PROLENE 4-0 RB1 .5 CRCL 36 (SUTURE) ×12 IMPLANT
SUT PROLENE 5 0 C 1 36 (SUTURE) ×12 IMPLANT
SUT PROLENE 6 0 CC (SUTURE) ×12 IMPLANT
SUT PROLENE 7 0 BV1 MDA (SUTURE) ×3 IMPLANT
SUT SILK  1 MH (SUTURE) ×3
SUT SILK 1 MH (SUTURE) ×6 IMPLANT
SUT SILK 1 TIES 10X30 (SUTURE) ×3 IMPLANT
SUT SILK 2 0 SH CR/8 (SUTURE) ×6 IMPLANT
SUT SILK 2 0 TIES 10X30 (SUTURE) ×3 IMPLANT
SUT SILK 2 0 TIES 17X18 (SUTURE) ×1
SUT SILK 2-0 18XBRD TIE BLK (SUTURE) ×2 IMPLANT
SUT SILK 3 0 SH CR/8 (SUTURE) ×3 IMPLANT
SUT SILK 4 0 TIES 17X18 (SUTURE) ×6 IMPLANT
SUT STEEL 6MS V (SUTURE) ×3 IMPLANT
SUT STEEL SZ 6 DBL 3X14 BALL (SUTURE) ×3 IMPLANT
SUT TEM PAC WIRE 2 0 SH (SUTURE) ×12 IMPLANT
SUT VIC AB 1 CTX 18 (SUTURE) ×6 IMPLANT
SUT VIC AB 2-0 CT1 27 (SUTURE) ×1
SUT VIC AB 2-0 CT1 TAPERPNT 27 (SUTURE) ×2 IMPLANT
SUT VIC AB 2-0 CTX 27 (SUTURE) ×6 IMPLANT
SUT VIC AB 3-0 X1 27 (SUTURE) ×6 IMPLANT
SUTURE E-PAK OPEN HEART (SUTURE) IMPLANT
SYSTEM SAHARA CHEST DRAIN ATS (WOUND CARE) ×3 IMPLANT
TAPE CLOTH SURG 4X10 WHT LF (GAUZE/BANDAGES/DRESSINGS) ×3 IMPLANT
TAPE PAPER 2X10 WHT MICROPORE (GAUZE/BANDAGES/DRESSINGS) ×3 IMPLANT
TOWEL GREEN STERILE (TOWEL DISPOSABLE) ×3 IMPLANT
TOWEL GREEN STERILE FF (TOWEL DISPOSABLE) ×3 IMPLANT
TOWEL OR 17X24 6PK STRL BLUE (TOWEL DISPOSABLE) ×3 IMPLANT
TOWEL OR 17X26 10 PK STRL BLUE (TOWEL DISPOSABLE) ×3 IMPLANT
TRAY FOLEY SILVER 16FR TEMP (SET/KITS/TRAYS/PACK) ×3 IMPLANT
TUBING INSUFFLATION (TUBING) ×3 IMPLANT
UNDERPAD 30X30 (UNDERPADS AND DIAPERS) ×3 IMPLANT
VALVE MAGNA EASE AORTIC 23MM (Prosthesis & Implant Heart) ×3 IMPLANT
VENT LEFT HEART 12002 (CATHETERS) ×3
WATER STERILE IRR 1000ML POUR (IV SOLUTION) ×6 IMPLANT

## 2017-07-04 NOTE — Anesthesia Preprocedure Evaluation (Addendum)
Anesthesia Evaluation  Patient identified by MRN, date of birth, ID band Patient awake    Reviewed: Allergy & Precautions, NPO status , Patient's Chart, lab work & pertinent test results, reviewed documented beta blocker date and time   Airway Mallampati: I  TM Distance: >3 FB Neck ROM: Full    Dental  (+) Edentulous Upper, Edentulous Lower, Dental Advisory Given   Pulmonary shortness of breath and with exertion, COPD,  COPD inhaler, Current Smoker,    breath sounds clear to auscultation       Cardiovascular + angina + CAD  + Valvular Problems/Murmurs AS  Rhythm:Regular Rate:Normal     Neuro/Psych Seizures -,     GI/Hepatic   Endo/Other    Renal/GU      Musculoskeletal   Abdominal   Peds  Hematology   Anesthesia Other Findings   Reproductive/Obstetrics                            Anesthesia Physical Anesthesia Plan  ASA: III  Anesthesia Plan:    Post-op Pain Management:    Induction: Intravenous  PONV Risk Score and Plan:   Airway Management Planned: Oral ETT  Additional Equipment: Arterial line, CVP, PA Cath, TEE and Ultrasound Guidance Line Placement  Intra-op Plan:   Post-operative Plan: Post-operative intubation/ventilation  Informed Consent: I have reviewed the patients History and Physical, chart, labs and discussed the procedure including the risks, benefits and alternatives for the proposed anesthesia with the patient or authorized representative who has indicated his/her understanding and acceptance.   Dental advisory given  Plan Discussed with: CRNA, Anesthesiologist and Surgeon  Anesthesia Plan Comments:         Anesthesia Quick Evaluation

## 2017-07-04 NOTE — Anesthesia Postprocedure Evaluation (Signed)
Anesthesia Post Note  Patient: Danny Krause  Procedure(s) Performed: AORTIC VALVE REPLACEMENT (AVR) (N/A Chest) CORONARY ARTERY BYPASS GRAFTING (CABG) x three, using left internal mammary artery and right leg greater saphenous vein harvested endoscopically (N/A Chest) TRANSESOPHAGEAL ECHOCARDIOGRAM (TEE) (N/A )     Patient location during evaluation: SICU Anesthesia Type: General Level of consciousness: sedated and patient remains intubated per anesthesia plan Vital Signs Assessment: post-procedure vital signs reviewed and stable Respiratory status: patient remains intubated per anesthesia plan and patient on ventilator - see flowsheet for VS Cardiovascular status: blood pressure returned to baseline Anesthetic complications: no    Last Vitals:  Vitals:   07/04/17 1915 07/04/17 2004  BP: 103/60 (!) 110/42  Pulse: 88   Resp: 18   Temp: 36.6 C   SpO2: 100% 100%    Last Pain:  Vitals:   07/04/17 1600  TempSrc: Core (Comment)                 Magally Vahle COKER

## 2017-07-04 NOTE — Anesthesia Procedure Notes (Signed)
Central Venous Catheter Insertion Performed by: Roderic Palau, anesthesiologist Start/End10/31/2018 6:50 AM, 07/04/2017 7:00 AM Patient location: Pre-op. Preanesthetic checklist: patient identified, IV checked, site marked, risks and benefits discussed, surgical consent, monitors and equipment checked, pre-op evaluation, timeout performed and anesthesia consent Hand hygiene performed  and maximum sterile barriers used  PA cath was placed.Swan type:thermodilution PA Cath depth:50 Procedure performed without using ultrasound guided technique. Attempts: 1 Patient tolerated the procedure well with no immediate complications.

## 2017-07-04 NOTE — Anesthesia Procedure Notes (Signed)
Central Venous Catheter Insertion Performed by: Roderic Palau, anesthesiologist Start/End10/31/2018 6:50 AM, 07/04/2017 7:00 AM Patient location: Pre-op. Preanesthetic checklist: patient identified, IV checked, site marked, risks and benefits discussed, surgical consent, monitors and equipment checked, pre-op evaluation, timeout performed and anesthesia consent Position: Trendelenburg Lidocaine 1% used for infiltration and patient sedated Hand hygiene performed , maximum sterile barriers used  and Seldinger technique used Catheter size: 9 Fr Total catheter length 10. Central line was placed.MAC introducer Swan type:thermodilution Procedure performed using ultrasound guided technique. Ultrasound Notes:anatomy identified, needle tip was noted to be adjacent to the nerve/plexus identified, no ultrasound evidence of intravascular and/or intraneural injection and image(s) printed for medical record Attempts: 1 Following insertion, line sutured, dressing applied and Biopatch. Post procedure assessment: blood return through all ports, free fluid flow and no air  Patient tolerated the procedure well with no immediate complications.

## 2017-07-04 NOTE — Progress Notes (Signed)
      [address]       [place] [address]             [phone]      S/p AVR, CABG  Intubated, starting to wake up   Most recent ABG OK- wean vent  Hemodynamics stable  Remo Lipps C. Roxan Hockey, MD [facility] [phone]

## 2017-07-04 NOTE — Anesthesia Procedure Notes (Signed)
Arterial Line Insertion Start/End10/31/2018 7:10 AM, 07/04/2017 7:17 AM Performed by: Kerrie Pleasure P  Patient location: Pre-op. Preanesthetic checklist: patient identified, risks and benefits discussed, monitors and equipment checked, pre-op evaluation and timeout performed Patient sedated Left, radial was placed Catheter size: 20 G Hand hygiene performed  and maximum sterile barriers used   Attempts: 1 Procedure performed without using ultrasound guided technique. Following insertion, dressing applied and Biopatch. Post procedure assessment: normal and unchanged  Patient tolerated the procedure well with no immediate complications.

## 2017-07-04 NOTE — OR Nursing (Signed)
14:20 - 45 minute call to SICU 15:05 - 20 minute call to SICU

## 2017-07-04 NOTE — Progress Notes (Signed)
  Echocardiogram Echocardiogram Transesophageal has been performed.  Danny Krause 07/04/2017, 8:49 AM

## 2017-07-04 NOTE — Transfer of Care (Signed)
Immediate Anesthesia Transfer of Care Note  Patient: Danny Krause  Procedure(s) Performed: AORTIC VALVE REPLACEMENT (AVR) (N/A Chest) CORONARY ARTERY BYPASS GRAFTING (CABG) x three, using left internal mammary artery and right leg greater saphenous vein harvested endoscopically (N/A Chest) TRANSESOPHAGEAL ECHOCARDIOGRAM (TEE) (N/A )  Patient Location: SICU  Anesthesia Type:General  Level of Consciousness: sedated and Patient remains intubated per anesthesia plan  Airway & Oxygen Therapy: Patient remains intubated per anesthesia plan and Patient placed on Ventilator (see vital sign flow sheet for setting)  Post-op Assessment: Report given to RN and Post -op Vital signs reviewed and stable  Post vital signs: Reviewed and stable  Last Vitals:  Vitals:   07/04/17 1545 07/04/17 1546  BP:    Pulse: 89 89  Resp: 14 15  Temp:    SpO2: 100% 100%    Last Pain:  Vitals:   07/04/17 0600  TempSrc: Oral         Complications: No apparent anesthesia complications

## 2017-07-04 NOTE — Brief Op Note (Signed)
07/04/2017  1:05 PM  PATIENT:  Danny Krause  76 y.o. male  PRE-OPERATIVE DIAGNOSIS:  1. Critical aortic stenosis 2. Multivessel coronary artery disease  POST-OPERATIVE DIAGNOSIS:  1. Critical aortic stenosis 2. Multivessel coronary artery disease  PROCEDURE: TRANSESOPHAGEAL ECHOCARDIOGRAM (TEE) MEDIAN STERNOTOMY for CORONARY ARTERY BYPASS GRAFTING (CABG) x 3 (LIMA to LAD, SVG to DIAGONAL, SVG to PDA) using left internal mammary artery and RIGHT leg greater saphenous vein harvested endoscopically and AORTIC VALVE REPLACEMENT (AVR) (usig Model# [id number], Serial # [id number], size 23)  SURGEON:  Surgeon(s) and Role:    * Grace Isaac, MD - Primary  PHYSICIAN ASSISTANT: Lars Pinks PA-C  ANESTHESIA:   general  DRAINS: Chest tubes placed in the mediastinal and pleural spaces   SPECIMEN:  Source of Specimen:  Native AV leaflets  DISPOSITION OF SPECIMEN:  PATHOLOGY  COUNTS CORRECT:  YES  DICTATION: .Dragon Dictation  PLAN OF CARE: Admit to inpatient   PATIENT DISPOSITION:  ICU - intubated and hemodynamically stable.   Delay start of Pharmacological VTE agent (>24hrs) due to surgical blood loss or risk of bleeding: yes  BASELINE WEIGHT: 61.7 kg

## 2017-07-04 NOTE — Anesthesia Procedure Notes (Signed)
Procedure Name: Intubation Date/Time: 07/04/2017 7:55 AM Performed by: Carney Living Pre-anesthesia Checklist: Patient identified, Emergency Drugs available, Suction available, Patient being monitored and Timeout performed Patient Re-evaluated:Patient Re-evaluated prior to induction Oxygen Delivery Method: Circle system utilized Preoxygenation: Pre-oxygenation with 100% oxygen Induction Type: IV induction Ventilation: Mask ventilation without difficulty and Oral airway inserted - appropriate to patient size Laryngoscope Size: Mac and 4 Grade View: Grade I Tube type: Oral Tube size: 8.0 mm Number of attempts: 1 Airway Equipment and Method: Stylet Placement Confirmation: ETT inserted through vocal cords under direct vision,  positive ETCO2 and breath sounds checked- equal and bilateral Secured at: 22 cm Tube secured with: Tape Dental Injury: Teeth and Oropharynx as per pre-operative assessment

## 2017-07-04 NOTE — H&P (Signed)
[address]       [place] [address]             [phone]                    Kenroy M Dornbush [place] Medical Record #[id number] Date of Birth: 1941/06/09  Referring: No ref. provider found Primary Care: Jani Gravel, MD  Chief Complaint:   Syncopal episode with seizures in patient with long history of cardiac murmur.  History of Present Illness:    Danny Krause 76 y.o. male is seen in the office  Two days ago  for evaluation of critical aortic stenosis in a patient who had a major syncopal episode with seizures in July while working in his yard.  The patient gives a history of knowing that he had a heart murmur since age 79.  He has had very little in the way of medical care over the years.  In early July he was cutting the grass in his yard, his family found him unconscious in the yard.  It was not clear how long he had been unconscious.  EMS came and it was documented that he had a seizure.  He was admitted to [facility] for several days, discharged home without a cardiology evaluation on seizure medication.  His wife sees Dr. Debara Pickett so ultimately he was seen by cardiology and evaluated with echocardiogram and cardiac catheterization last week.  The patient's had no further syncopal episodes.  He does become short of breath with minimal activity.  With exertion he also notes tightness in his throat and upper chest.  He has been a long-term smoker since age 7.  Prior to the episode in July he been very active individual.     Current Activity/ Functional Status:  Patient is independent with mobility/ambulation, transfers, ADL's, IADL's.   Zubrod Score: At the time of surgery this patient's most appropriate activity status/level should be described as: []     0    Normal activity, no symptoms [x]     1    Restricted in physical strenuous activity but ambulatory, able to do out light work []     2    Ambulatory and capable of self care, unable to do work  activities, up and about               >50 % of waking hours                              []     3    Only limited self care, in bed greater than 50% of waking hours []     4    Completely disabled, no self care, confined to bed or chair []     5    Moribund   Past Medical History:  Diagnosis Date  . Anginal pain ([place])    with exertion  . Coronary artery disease   . Critical aortic valve stenosis   . Dyspnea    with exertion  . Murmur   . Seizure ([place])   . Syncope     Past Surgical History:  Procedure Laterality Date  . NO PAST SURGERIES    . RIGHT/LEFT HEART CATH AND CORONARY ANGIOGRAPHY N/A 06/25/2017   Procedure: RIGHT/LEFT HEART CATH AND CORONARY ANGIOGRAPHY;  Surgeon: Troy Sine, MD;  Location: [facility];  Service: Cardiovascular;  Laterality: N/A;  .  THORACIC AORTOGRAM  06/25/2017   Procedure: THORACIC AORTOGRAM;  Surgeon: Troy Sine, MD;  Location: [facility];  Service: Cardiovascular;;  aortic root     Family History  Problem Relation Age of Onset  . Hernia Father   . Stroke Brother   Patient notes that his father died of 38 mother died at 83, has 3 brothers 1 alive at age 27 with history of stroke and diabetes 2 brothers and died in motor vehicle accidents, 2 sisters one had a myocardial infarction at age 53 and another died in her 78G diabetes complications, patient has 1 son and 1 daughter who are alive,  Social History   Social History  . Marital status: Married    Spouse name: [name]  . Number of children: 2  . Years of education: 12   Occupational History  . Retired    Social History Main Topics  . Smoking status: Current Every Day Smoker    Packs/day: 2.00    Years: 64.00    Types: Cigarettes  . Smokeless tobacco: Never Used     Comment: SINCE I WAS 76 YRS OLD  . Alcohol use No  . Drug use: No  . Sexual activity: Not on file   Other Topics Concern  . Not on file   Social History Narrative   Lives with wife   Caffeine  use: some   Right handed    History  Smoking Status  . Current Every Day Smoker  . Packs/day: 2.00  . Years: 64.00  . Types: Cigarettes  Smokeless Tobacco  . Never Used    Comment: SINCE I WAS 76 YRS OLD    History  Alcohol Use No     No Known Allergies  Current Facility-Administered Medications  Medication Dose Route Frequency Provider Last Rate Last Dose  . cefUROXime (ZINACEF) 1.5 g in dextrose 5 % 50 mL IVPB  1.5 g Intravenous To OR Grace Isaac, MD      . cefUROXime (ZINACEF) 750 mg in dextrose 5 % 50 mL IVPB  750 mg Intravenous To OR Grace Isaac, MD      . chlorhexidine (HIBICLENS) 4 % liquid 2 application  30 mL Topical UD Grace Isaac, MD      . dexmedetomidine (PRECEDEX) 400 MCG/100ML (4 mcg/mL) infusion  0.1-0.7 mcg/kg/hr Intravenous To OR Grace Isaac, MD      . DOPamine (INTROPIN) 800 mg in dextrose 5 % 250 mL (3.2 mg/mL) infusion  0-10 mcg/kg/min Intravenous To OR Grace Isaac, MD      . EPINEPHrine (ADRENALIN) 4 mg in dextrose 5 % 250 mL (0.016 mg/mL) infusion  0-10 mcg/min Intravenous To OR Grace Isaac, MD      . heparin 2,500 Units, papaverine 30 mg in electrolyte-148 (PLASMALYTE-148) 500 mL irrigation   Irrigation To OR Grace Isaac, MD      . heparin 30,000 units/NS 1000 mL solution for CELLSAVER   Other To OR Grace Isaac, MD      . insulin regular (NOVOLIN R,HUMULIN R) 100 Units in sodium chloride 0.9 % 100 mL (1 Units/mL) infusion   Intravenous To OR Grace Isaac, MD      . magnesium sulfate (IV Push/IM) injection 40 mEq  40 mEq Other To OR Grace Isaac, MD      . nitroGLYCERIN 50 mg in dextrose 5 % 250 mL (0.2 mg/mL) infusion  2-200 mcg/min Intravenous To OR Grace Isaac, MD      .  phenylephrine (NEO-SYNEPHRINE) 20 mg in sodium chloride 0.9 % 250 mL (0.08 mg/mL) infusion  30-200 mcg/min Intravenous To OR Grace Isaac, MD      . potassium chloride injection 80 mEq  80 mEq Other To OR  Grace Isaac, MD      . tranexamic acid (CYKLOKAPRON) 2,500 mg in sodium chloride 0.9 % 250 mL (10 mg/mL) infusion  1.5 mg/kg/hr Intravenous To OR Grace Isaac, MD      . tranexamic acid (CYKLOKAPRON) bolus via infusion - over 30 minutes 1,020 mg  15 mg/kg Intravenous To OR Grace Isaac, MD      . tranexamic acid (CYKLOKAPRON) pump prime solution 136 mg  2 mg/kg Intracatheter To OR Grace Isaac, MD      . vancomycin (VANCOCIN) 1,250 mg in sodium chloride 0.9 % 250 mL IVPB  1,250 mg Intravenous To OR Grace Isaac, MD       Facility-Administered Medications Ordered in Other Encounters  Medication Dose Route Frequency Provider Last Rate Last Dose  . fentaNYL (SUBLIMAZE) injection   Intravenous Anesthesia Intra-op Carney Living, CRNA   50 mcg at 07/04/17 0650  . midazolam (VERSED) 5 MG/5ML injection    Anesthesia Intra-op Carney Living, CRNA   1 mg at 07/04/17 5621    Pertinent items are noted in HPI.   Review of Systems:     Cardiac Review of Systems: Y or N  Chest Pain Blue.Reese    ]  Resting SOB [  Y ] Exertional SOB  [  Y]  Orthopnea [ Y ]   Pedal Edema [ n  ]    Palpitations [n  ] Syncope  [ yes ]   Presyncope [   ]  General Review of Systems: [Y] = yes [  ]=no Constitional: recent weight change [ n ];  Wt loss over the last 3 months [   ] anorexia [  ]; fatigue [  ]; nausea [  ]; night sweats [  ]; fever [  ]; or chills [  ];          Dental: poor dentition[ dentures  ]; Last Dentist visit:   Eye : blurred vision [  ]; diplopia [   ]; vision changes [  ];  Amaurosis fugax[  ]; Resp: cough [ y ];  wheezing[ n ];  hemoptysis[ n ]; shortness of breath[ n ]; paroxysmal nocturnal dyspnea[ n ]; dyspnea on exertion[ n ]; or orthopnea[ n ];  GI:  gallstones[  ], vomiting[  ];  dysphagia[  ]; melena[  ];  hematochezia [  ]; heartburn[  ];   Hx of  Colonoscopy[  ]; GU: kidney stones [  ]; hematuria[n  ];   dysuria [ n ];  nocturia[  n];  history of     obstruction [  ];  urinary frequency [  ]             Skin: rash, swelling[  ];, hair loss[  ];  peripheral edema[  ];  or itching[  ]; Musculosketetal: myalgias[  ];  joint swelling[  ];  joint erythema[ n ];  joint pain[  ];  back pain[ n ];  Heme/Lymph: bruising[  ];  bleeding[  ];  anemia[  ];  Neuro: TIA[  ];  headaches[  ];  stroke[  ];  vertigo[  ];  seizures[  ];   paresthesias[  ];  difficulty walking[  ];  Psych:depression[  y ]; anxiety[ y ];  Endocrine: diabetes[  ];  thyroid dysfunction[  ];  Immunizations: Flu up to date [n  ]; Pneumococcal up to date [last year  ];  Other:  Physical Exam: BP 125/75   Pulse 71   Temp 98.2 F (36.8 C) (Oral)   Resp 18   Ht 5\' 7"  (1.702 m)   Wt 148 lb (67.1 kg)   SpO2 97%   BMI 23.18 kg/m   PHYSICAL EXAMINATION: General appearance: alert, cooperative, appears older than stated age and no distress Head: Normocephalic, without obvious abnormality, atraumatic Neck: no adenopathy, no carotid bruit, no JVD, supple, symmetrical, trachea midline, thyroid not enlarged, symmetric, no tenderness/mass/nodules and Patient has loud murmurs over both carotid arteries radiating from the chest consistent with aortic stenosis Lymph nodes: Cervical, supraclavicular, and axillary nodes normal. Resp: diminished breath sounds bibasilar Back: symmetric, no curvature. ROM normal. No CVA tenderness. Cardio: regular rate and rhythm and systolic murmur: holosystolic 4/6, crescendo and harsh throughout the precordium GI: soft, non-tender; bowel sounds normal; no masses,  no organomegaly Extremities: extremities normal, atraumatic, no cyanosis or edema and varicose veins noted Neurologic: Grossly normal  Diagnostic Studies & Laboratory data:     Recent Radiology Findings:   Study Result   CLINICAL DATA:  Found down.  Possible seizure or stroke.  EXAM: MRI HEAD WITHOUT AND WITH CONTRAST  TECHNIQUE: Multiplanar, multiecho pulse sequences of the brain and  surrounding structures were obtained without and with intravenous contrast.  CONTRAST:  62mL MULTIHANCE GADOBENATE DIMEGLUMINE 529 MG/ML IV SOLN  COMPARISON:  Motion degraded head CT 03/13/2017.  FINDINGS: The patient was unable to remain motionless for the exam. Small or subtle lesions could be overlooked.  Brain: No evidence for acute infarction, hemorrhage, mass lesion, hydrocephalus, or extra-axial fluid. Moderate cerebral and cerebellar atrophy. Mild subcortical and periventricular T2 and FLAIR hyperintensities, likely chronic microvascular ischemic change.  Post infusion, no abnormal enhancement of the brain or meninges.  Vascular: Flow voids are maintained throughout the carotid, basilar, and vertebral arteries. There are no areas of chronic hemorrhage.  Skull and upper cervical spine: Normal marrow signal. Small midline scalp hematoma near the vertex.  Sinuses/Orbits: No acute finding.  Other: Unremarkable.  IMPRESSION: Motion degraded examination demonstrating no definite acute or focal intracranial abnormality.  Atrophy and small vessel disease. No visible abnormal postcontrast enhancement.  Small scalp hematoma near the vertex.   Electronically Signed   By: Staci Righter M.D.   On: 03/14/2017 11:09    Dg Chest 2 View  Result Date: 07/03/2017 CLINICAL DATA:  Preop evaluation for upcoming heart surgery EXAM: CHEST  2 VIEW COMPARISON:  03/13/2017 FINDINGS: Cardiac shadow is within normal limits. The lungs are hyperinflated bilaterally. No focal infiltrate or sizable effusion is seen. Mild increased central markings are noted similar to that seen on prior exam and likely of a chronic nature. No acute bony abnormality is seen. IMPRESSION: No acute abnormality is noted.  Likely chronic bronchitic changes. Electronically Signed   By: Inez Catalina M.D.   On: 07/03/2017 13:39   Ct Angio Chest Aorta W/cm &/or Wo/cm  Result Date: 07/02/2017 CLINICAL  DATA:  Preoperative study prior to aortic valve replacement. EXAM: CT ANGIOGRAPHY CHEST WITH CONTRAST TECHNIQUE: Multidetector CT imaging of the chest was performed using the standard protocol during bolus administration of intravenous contrast. Multiplanar CT image reconstructions and MIPs were obtained to evaluate the vascular anatomy. CONTRAST:  75 cc Isovue 370 intravenous contrast. COMPARISON:  None. FINDINGS:  Cardiovascular: Preferential opacification of the thoracic aorta. No evidence of thoracic aortic aneurysm or dissection. The ascending thoracic aorta measures up to 3.8 cm. Heart size is at the upper limits of normal. No pericardial effusion. Coronary, aortic arch, and branch vessel atherosclerotic vascular disease. Extensive aortic valve calcifications. Mediastinum/Nodes: No enlarged mediastinal, hilar, or axillary lymph nodes. Thyroid gland, trachea, and esophagus demonstrate no significant findings. 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 Mild diffuse peribronchial thickening. There is a 10 x 7 x 13 mm solid nodule in the left lower lobe (series 5, image 89). Additional 17 x 10 x 9 mm irregular nodule in the right lower lobe (series 5, image 16). No pleural effusion or pneumothorax. Upper Abdomen: No acute abnormality. Musculoskeletal: No chest wall abnormality. No acute or significant osseous findings. Review of the MIP images confirms the above findings. IMPRESSION: 1. No evidence of thoracic aortic aneurysm. Aortic atherosclerosis (ICD10-I70.0). Extensive aortic valve calcifications. 2. 1.3 cm solid nodule in the left lower lobe. 1.7 cm solid nodule in the right lower lobe. Consider one of the following in 3 months for both low-risk and high-risk individuals: (a) repeat chest CT, (b) follow-up PET-CT, or (c) tissue sampling. This recommendation follows the consensus statement: Guidelines for Management of Incidental Pulmonary Nodules Detected on CT  Images: From the Fleischner Society 2017; Radiology 2017; 284:228-243. 3. 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 Electronically Signed   By: Titus Dubin M.D.   On: 07/02/2017 14:24  I have independently reviewed the above radiology studies  and reviewed the findings with the patient.   Recent Lab Findings: Lab Results  Component Value Date   WBC 9.7 07/03/2017   HGB 9.9 (L) 07/03/2017   HCT 31.6 (L) 07/03/2017   PLT 302 07/03/2017   GLUCOSE 113 (H) 07/03/2017   CHOL 196 03/13/2017   TRIG 74 03/13/2017   HDL 47 03/13/2017   LDLCALC 134 (H) 03/13/2017   ALT 9 (L) 07/03/2017   AST 13 (L) 07/03/2017   NA 135 07/03/2017   K 4.6 07/03/2017   CL 103 07/03/2017   CREATININE 1.06 07/03/2017   BUN 17 07/03/2017   CO2 22 07/03/2017   TSH 3.310 06/18/2017   INR 0.97 07/03/2017   HGBA1C 5.9 (H) 07/03/2017   Procedures   RIGHT/LEFT HEART CATH AND CORONARY ANGIOGRAPHY  THORACIC AORTOGRAM  Conclusion     Prox LAD lesion, 70 %stenosed.  Prox LAD to Mid LAD lesion, 40 %stenosed.  Prox Cx to Mid Cx lesion, 50 %stenosed.  Prox RCA lesion, 50 %stenosed.  Prox RCA to Mid RCA lesion, 80 %stenosed.   Critical aortic valve stenosis with a peak to peak gradient of 104, mean gradient of 80 mmHg, and aortic valve area of 0.37 cm.  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  There is severely reduced aortic valve excursion with trivial AR.  Normal LV function with an EF of 50-55%;  severe left ventricular hypertrophy.  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  RECOMMENDATION: Aortic valve replacement with CABG revascularization surgery.    Right Heart   Right Heart Pressures RA: A wave 12, V wave 10, mean 9 RV: 46/8 PA:  45/21 PW: A wave 20, V wave 16; mean 13  AO: 127/65 PA: 45/21  LV: 206/24 PW: 21  Pullback:  LV: 231/23 AO: 127/65  Oxygen saturation in the PA 62% and in the AO 98%.  Cardiac output by the thermodilution method 3.9, and by the Fick method 4.4 L/m Cardiac index by the thermodilution method 2.2, and by the Fick method 2.5 L/m/m  Aortic valve: Peak to peak gradient 104 mm Hg, mean gradient 80 mm Hg Aortic valve area 0.37 cm      I have independently reviewed the above  cath films and reviewed the findings with the  patient .    Result status: Final result                           [facility]                        [address], [place] [address]                            [phone]  ------------------------------------------------------------------- Transthoracic Echocardiography  Patient:    Danny Krause, Danny Krause MR #:       [id number] Study Date: 05/15/2017 Gender:     M Age:        73 Height:     170.2 cm Weight:     68.9 kg BSA:        1.81 m^2 Pt. Status: Room:   ATTENDING    Candee Furbish, M.D.  SONOGRAPHER  Cindy Hazy, [place] MD  [place] MD  PERFORMING   [facility], Outpatient  cc:  ------------------------------------------------------------------- LV EF: 55% -   60%  ------------------------------------------------------------------- Indications:      R01.1 Murmur.  ------------------------------------------------------------------- History:   PMH:  Acquired from the patient and from the patient&'s chart.  PMH:  Murmur. Seizure. Syncope.  ------------------------------------------------------------------- Study Conclusions  - Left ventricle: The cavity size was normal. Systolic function was   normal. The estimated ejection fraction was in the range of 55%   to 60%. Wall motion was normal; there were no regional wall   motion abnormalities. Doppler  parameters are consistent with   abnormal left ventricular relaxation (grade 1 diastolic   dysfunction). - Aortic valve: There was severe stenosis. Peak velocity (S): 552   cm/s. Mean gradient (S): 87 mm Hg. Valve area (Vmax): 0.4 cm^2.  ------------------------------------------------------------------- Study data:   Study status:  Routine.  Procedure:  The patient reported no pain pre or post test. Transthoracic echocardiography for left ventricular function evaluation, for right ventricular function evaluation, and for assessment of valvular function. Image quality was adequate.  Study completion:  There were no complications.          Transthoracic echocardiography.  M-mode, complete 2D, spectral Doppler, and color Doppler.  Birthdate: Patient birthdate: [birth year].  Age:  Patient is 76 yr old.  Sex: Gender: male.    BMI: 23.8 kg/m^2.  Blood pressure:     128/76 Patient status:  Outpatient.  Study date:  Study date: 05/15/2017. Study time: 12:57 PM.  Location:  [facility]  -------------------------------------------------------------------  ------------------------------------------------------------------- Left ventricle:  The cavity size was normal. Systolic function was normal. The estimated ejection fraction was in the range of 55% to 60%. Wall motion was normal; there were  no regional wall motion abnormalities. Doppler parameters are consistent with abnormal left ventricular relaxation (grade 1 diastolic dysfunction).  ------------------------------------------------------------------- Aortic valve:   Trileaflet; severely thickened, severely calcified leaflets. Mobility was not restricted.  Doppler:   There was severe stenosis.   There was no regurgitation.    VTI ratio of LVOT to aortic valve: 0.1. Valve area (VTI): 0.4 cm^2. Indexed valve area (VTI): 0.22 cm^2/m^2. Peak velocity ratio of LVOT to aortic valve: 0.1. Valve area (Vmax): 0.4 cm^2. Indexed valve area  (Vmax): 0.22 cm^2/m^2. Mean velocity ratio of LVOT to aortic valve: 0.1. Valve area (Vmean): 0.39 cm^2. Indexed valve area (Vmean): 0.22 cm^2/m^2.    Mean gradient (S): 87 mm Hg. Peak gradient (S): 122 mm Hg.  ------------------------------------------------------------------- Aorta:  Aortic root: The aortic root was normal in size.  ------------------------------------------------------------------- Mitral valve:   Structurally normal valve.   Mobility was not restricted.  Doppler:  Transvalvular velocity was within the normal range. There was no evidence for stenosis. There was no regurgitation.    Peak gradient (D): 2 mm Hg.  ------------------------------------------------------------------- Left atrium:  The atrium was normal in size.  ------------------------------------------------------------------- Right ventricle:  The cavity size was normal. Wall thickness was normal. Systolic function was normal.  ------------------------------------------------------------------- Pulmonic valve:   Poorly visualized.  Structurally normal valve. Cusp separation was normal.  Doppler:  Transvalvular velocity was within the normal range. There was no evidence for stenosis. There was no regurgitation.  ------------------------------------------------------------------- Tricuspid valve:   Structurally normal valve.    Doppler: Transvalvular velocity was within the normal range. There was no regurgitation.  ------------------------------------------------------------------- Pulmonary artery:   The main pulmonary artery was normal-sized. Systolic pressure was within the normal range.  ------------------------------------------------------------------- Right atrium:  The atrium was normal in size.  ------------------------------------------------------------------- Pericardium:  A prominent pericardial fat pad was present. There was no pericardial  effusion.  ------------------------------------------------------------------- Systemic veins: Inferior vena cava: The vessel was normal in size.  ------------------------------------------------------------------- Measurements   Left ventricle                            Value          Reference  LV ID, ED, PLAX chordal                   43.1  mm       43 - 52  LV ID, ES, PLAX chordal                   29.6  mm       23 - 38  LV fx shortening, PLAX chordal            31    %        >=29  LV PW thickness, ED                       16.6  mm       ---------  IVS/LV PW ratio, ED                       0.99           <=1.3  Stroke volume, 2D                         56    ml       ---------  Stroke volume/bsa, 2D  31    ml/m^2   ---------  LV e&', lateral                            4.61  cm/s     ---------  LV E/e&', lateral                          15.94          ---------  LV e&', medial                             3.95  cm/s     ---------  LV E/e&', medial                           18.61          ---------  LV e&', average                            4.28  cm/s     ---------  LV E/e&', average                          17.17          ---------    Ventricular septum                        Value          Reference  IVS thickness, ED                         16.4  mm       ---------    LVOT                                      Value          Reference  LVOT ID, S                                22    mm       ---------  LVOT area                                 3.8   cm^2     ---------  LVOT ID                                   22    mm       ---------  LVOT peak velocity, S                     57.4  cm/s     ---------  LVOT mean velocity, S                     46.3  cm/s     ---------  LVOT VTI, S  14.8  cm       ---------  LVOT peak gradient, S                     1     mm Hg    ---------  Stroke volume (SV), LVOT DP               56.3  ml        ---------  Stroke index (SV/bsa), LVOT DP            31.1  ml/m^2   ---------    Aortic valve                              Value          Reference  Aortic valve peak velocity, S             552   cm/s     ---------  Aortic valve mean velocity, S             452   cm/s     ---------  Aortic valve VTI, S                       141   cm       ---------  Aortic mean gradient, S                   87    mm Hg    ---------  Aortic peak gradient, S                   122   mm Hg    ---------  VTI ratio, LVOT/AV                        0.1            ---------  Aortic valve area, VTI                    0.4   cm^2     ---------  Aortic valve area/bsa, VTI                0.22  cm^2/m^2 ---------  Velocity ratio, peak, LVOT/AV             0.1            ---------  Aortic valve area, peak velocity          0.4   cm^2     ---------  Aortic valve area/bsa, peak               0.22  cm^2/m^2 ---------  velocity  Velocity ratio, mean, LVOT/AV             0.1            ---------  Aortic valve area, mean velocity          0.39  cm^2     ---------  Aortic valve area/bsa, mean               0.22  cm^2/m^2 ---------  velocity    Aorta                                     Value  Reference  Aortic root ID, ED                        34    mm       ---------    Left atrium                               Value          Reference  LA ID, A-P, ES                            44    mm       ---------  LA ID/bsa, A-P                    (H)     2.43  cm/m^2   <=2.2  LA volume, S                              54    ml       ---------  LA volume/bsa, S                          29.8  ml/m^2   ---------  LA volume, ES, 1-p A4C                    47    ml       ---------  LA volume/bsa, ES, 1-p A4C                26    ml/m^2   ---------  LA volume, ES, 1-p A2C                    52    ml       ---------  LA volume/bsa, ES, 1-p A2C                28.7  ml/m^2   ---------    Mitral valve                               Value          Reference  Mitral E-wave peak velocity               73.5  cm/s     ---------  Mitral A-wave peak velocity               125   cm/s     ---------  Mitral deceleration time          (H)     275   ms       150 - 230  Mitral peak gradient, D                   2     mm Hg    ---------  Mitral E/A ratio, peak                    0.6            ---------    Right ventricle  Value          Reference  RV s&', lateral, S                         12.1  cm/s     ---------  Legend: (L)  and  (H)  mark values outside specified reference range.  ------------------------------------------------------------------- Prepared and Electronically Authenticated by  Candee Furbish, M.D. 2018-09-11T16:43:44   PFT's  Interpretation: The FEV1, FEV1/FVC ratio and FEF25-75% are reduced indicating airway obstruction. It is impossible to adequately evaluate the flow volume loop due to excessive variablity such as might be produced by coughing. The FVC is reduced relative to the SVC indicating air trapping. The normal airway resistance and decreased specific conductance indicate a peripheral or small airway disease. The TLC, RV, FRC and RV/TLC ratio are all increased indicating overinflation and air trapping. Following administration of bronchodilators, there is a significant response indicated by the increased FVC. The reduced diffusing capacity indicates a severe loss of functional alveolar capillary surface. Maldistribution of ventilation is indicated by the difference between the alveolar volume and the total lung capacity. Conclusions: A reduced diffusing capacity, moderately severe airway obstruction and overinflation are characteristic of emphysema. The response to bronchodilators indicates a reversible component. In view of the severity of the diffusion defect, studies with exercise would be helpful to evaluate the presence of hypoxemia. 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 1.7 cm solid nodule in the right lower lobe. Noted on CT no previous comparison  Plan  following in 3 months  With  repeat chest CT. 7/ NO dilated ascending aorta  I reviewed the patient's history, symptoms, past records, cath and echo findings.  He has very critical aortic stenosis, with very worrisome symptoms.  I recommended to the patient that we proceed relatively quickly with coronary artery bypass grafting and aortic valve replacement.  With his age of recommended tissue valve would be the most appropriate.  And plan to proceed with aortic valve replacement and coronary artery bypass grafting possible replacement of a sending aorta if necessary on Wednesday October 31. Risks and options were discussed with the patient his son and brother-in-law in detail, including the risk of death infection stroke bleeding myocardial infarction need for permanent pacemaker.  Patient is agreeable and had his questions answered.   The goals risks and alternatives of the planned surgical procedure Procedure(s) with comments: AORTIC VALVE REPLACEMENT (AVR), CORONARY ARTERY BYPASS GRAFTING,CORONARY ARTERY BYPASS GRAFTING (CABG) (N/A) REPLACEMENT ASCENDING AORTA (N/A) -  POSSIBLE TRANSESOPHAGEAL ECHOCARDIOGRAM (TEE) (N/A)  have been discussed with the patient in detail. The risks of the procedure including death, infection, stroke, myocardial infarction, bleeding, blood transfusion have all been discussed specifically.  I have quoted Princella Pellegrini a 4% of perioperative mortality and a complication rate as high as 50 %. The patient's questions have been answered.Danny Krause is willing  to proceed  with the planned procedure.  Grace Isaac MD      [address] [place] [address] Office [phone]   [place]

## 2017-07-05 ENCOUNTER — Inpatient Hospital Stay (HOSPITAL_COMMUNITY): Payer: Medicare Other

## 2017-07-05 ENCOUNTER — Encounter (HOSPITAL_COMMUNITY): Payer: Self-pay | Admitting: Cardiothoracic Surgery

## 2017-07-05 LAB — POCT I-STAT 3, ART BLOOD GAS (G3+)
Acid-base deficit: 3 mmol/L — ABNORMAL HIGH (ref 0.0–2.0)
Acid-base deficit: 3 mmol/L — ABNORMAL HIGH (ref 0.0–2.0)
Bicarbonate: 21.8 mmol/L (ref 20.0–28.0)
Bicarbonate: 22.4 mmol/L (ref 20.0–28.0)
O2 Saturation: 90 %
O2 Saturation: 98 %
Patient temperature: 36.5
Patient temperature: 37
TCO2: 23 mmol/L (ref 22–32)
TCO2: 24 mmol/L (ref 22–32)
pCO2 arterial: 34.7 mmHg (ref 32.0–48.0)
pCO2 arterial: 40.5 mmHg (ref 32.0–48.0)
pH, Arterial: 7.35 (ref 7.350–7.450)
pH, Arterial: 7.405 (ref 7.350–7.450)
pO2, Arterial: 61 mmHg — ABNORMAL LOW (ref 83.0–108.0)
pO2, Arterial: 97 mmHg (ref 83.0–108.0)

## 2017-07-05 LAB — GLUCOSE, CAPILLARY
Glucose-Capillary: 119 mg/dL — ABNORMAL HIGH (ref 65–99)
Glucose-Capillary: 124 mg/dL — ABNORMAL HIGH (ref 65–99)
Glucose-Capillary: 127 mg/dL — ABNORMAL HIGH (ref 65–99)
Glucose-Capillary: 130 mg/dL — ABNORMAL HIGH (ref 65–99)
Glucose-Capillary: 145 mg/dL — ABNORMAL HIGH (ref 65–99)
Glucose-Capillary: 88 mg/dL (ref 65–99)
Glucose-Capillary: 92 mg/dL (ref 65–99)

## 2017-07-05 LAB — POCT I-STAT, CHEM 8
BUN: 17 mg/dL (ref 6–20)
BUN: 23 mg/dL — ABNORMAL HIGH (ref 6–20)
Calcium, Ion: 1.18 mmol/L (ref 1.15–1.40)
Calcium, Ion: 1.19 mmol/L (ref 1.15–1.40)
Chloride: 108 mmol/L (ref 101–111)
Chloride: 105 mmol/L (ref 101–111)
Creatinine, Ser: 0.8 mg/dL (ref 0.61–1.24)
Creatinine, Ser: 1.3 mg/dL — ABNORMAL HIGH (ref 0.61–1.24)
Glucose, Bld: 106 mg/dL — ABNORMAL HIGH (ref 65–99)
Glucose, Bld: 117 mg/dL — ABNORMAL HIGH (ref 65–99)
HCT: 22 % — ABNORMAL LOW (ref 39.0–52.0)
HCT: 22 % — ABNORMAL LOW (ref 39.0–52.0)
Hemoglobin: 7.5 g/dL — ABNORMAL LOW (ref 13.0–17.0)
Hemoglobin: 7.5 g/dL — ABNORMAL LOW (ref 13.0–17.0)
Potassium: 4.6 mmol/L (ref 3.5–5.1)
Potassium: 4.6 mmol/L (ref 3.5–5.1)
Sodium: 140 mmol/L (ref 135–145)
Sodium: 139 mmol/L (ref 135–145)
TCO2: 22 mmol/L (ref 22–32)
TCO2: 24 mmol/L (ref 22–32)

## 2017-07-05 LAB — CBC
HCT: 22.9 % — ABNORMAL LOW (ref 39.0–52.0)
HEMATOCRIT: 22.8 % — AB (ref 39.0–52.0)
HEMOGLOBIN: 7.2 g/dL — AB (ref 13.0–17.0)
Hemoglobin: 7.4 g/dL — ABNORMAL LOW (ref 13.0–17.0)
MCH: 26.1 pg (ref 26.0–34.0)
MCH: 25.8 pg — AB (ref 26.0–34.0)
MCHC: 32.3 g/dL (ref 30.0–36.0)
MCHC: 31.6 g/dL (ref 30.0–36.0)
MCV: 80.9 fL (ref 78.0–100.0)
MCV: 81.7 fL (ref 78.0–100.0)
PLATELETS: 174 10*3/uL (ref 150–400)
Platelets: 181 10*3/uL (ref 150–400)
RBC: 2.83 MIL/uL — ABNORMAL LOW (ref 4.22–5.81)
RBC: 2.79 MIL/uL — ABNORMAL LOW (ref 4.22–5.81)
RDW: 13.5 % (ref 11.5–15.5)
RDW: 14.3 % (ref 11.5–15.5)
WBC: 12.5 10*3/uL — ABNORMAL HIGH (ref 4.0–10.5)
WBC: 16.6 10*3/uL — ABNORMAL HIGH (ref 4.0–10.5)

## 2017-07-05 LAB — HEXAGONAL PHASE PHOSPHOLIPID: Hexagonal Phase Phospholipid: 32 s — ABNORMAL HIGH (ref 0–11)

## 2017-07-05 LAB — DRVVT MIX: dRVVT Mix: 43.5 s (ref 0.0–47.0)

## 2017-07-05 LAB — LUPUS ANTICOAGULANT PANEL
DRVVT: 56.5 s — ABNORMAL HIGH (ref 0.0–47.0)
PTT Lupus Anticoagulant: 75.2 s — ABNORMAL HIGH (ref 0.0–51.9)

## 2017-07-05 LAB — BASIC METABOLIC PANEL
Anion gap: 5 (ref 5–15)
BUN: 17 mg/dL (ref 6–20)
CALCIUM: 7.9 mg/dL — AB (ref 8.9–10.3)
CHLORIDE: 110 mmol/L (ref 101–111)
CO2: 23 mmol/L (ref 22–32)
CREATININE: 1.03 mg/dL (ref 0.61–1.24)
Glucose, Bld: 106 mg/dL — ABNORMAL HIGH (ref 65–99)
Potassium: 4.6 mmol/L (ref 3.5–5.1)
SODIUM: 138 mmol/L (ref 135–145)

## 2017-07-05 LAB — PREPARE RBC (CROSSMATCH)

## 2017-07-05 LAB — CREATININE, SERUM
Creatinine, Ser: 1.31 mg/dL — ABNORMAL HIGH (ref 0.61–1.24)
GFR calc Af Amer: 59 mL/min — ABNORMAL LOW (ref >60)
GFR, EST NON AFRICAN AMERICAN: 51 mL/min — AB (ref >60)

## 2017-07-05 LAB — MAGNESIUM
MAGNESIUM: 2.6 mg/dL — AB (ref 1.7–2.4)
MAGNESIUM: 2.8 mg/dL — AB (ref 1.7–2.4)

## 2017-07-05 LAB — PTT-LA MIX: PTT-LA MIX: 70 s — AB (ref 0.0–48.9)

## 2017-07-05 MED ORDER — INSULIN ASPART 100 UNIT/ML ~~LOC~~ SOLN
0.0000 [IU] | SUBCUTANEOUS | Status: DC
  Administered 2017-07-06: 2 [IU] via SUBCUTANEOUS

## 2017-07-05 MED ORDER — FUROSEMIDE 10 MG/ML IJ SOLN
40.0000 mg | Freq: Once | INTRAMUSCULAR | Status: AC
  Administered 2017-07-05: 40 mg via INTRAVENOUS
  Filled 2017-07-05: qty 4

## 2017-07-05 MED ORDER — ENOXAPARIN SODIUM 30 MG/0.3ML ~~LOC~~ SOLN
30.0000 mg | Freq: Every day | SUBCUTANEOUS | Status: DC
  Administered 2017-07-05 – 2017-07-09 (×5): 30 mg via SUBCUTANEOUS
  Filled 2017-07-05 (×5): qty 0.3

## 2017-07-05 MED ORDER — ORAL CARE MOUTH RINSE
15.0000 mL | Freq: Two times a day (BID) | OROMUCOSAL | Status: DC
  Administered 2017-07-05 – 2017-07-06 (×3): 15 mL via OROMUCOSAL

## 2017-07-05 MED FILL — Heparin Sodium (Porcine) Inj 1000 Unit/ML: INTRAMUSCULAR | Qty: 30 | Status: AC

## 2017-07-05 MED FILL — Mannitol IV Soln 20%: INTRAVENOUS | Qty: 500 | Status: AC

## 2017-07-05 MED FILL — Lidocaine HCl IV Inj 20 MG/ML: INTRAVENOUS | Qty: 5 | Status: AC

## 2017-07-05 MED FILL — Heparin Sodium (Porcine) Inj 1000 Unit/ML: INTRAMUSCULAR | Qty: 10 | Status: AC

## 2017-07-05 MED FILL — Thrombin (Recombinant) For Soln 5000 Unit: CUTANEOUS | Qty: 5000 | Status: AC

## 2017-07-05 MED FILL — Potassium Chloride Inj 2 mEq/ML: INTRAVENOUS | Qty: 40 | Status: AC

## 2017-07-05 MED FILL — Magnesium Sulfate Inj 50%: INTRAMUSCULAR | Qty: 10 | Status: AC

## 2017-07-05 MED FILL — Sodium Chloride IV Soln 0.9%: INTRAVENOUS | Qty: 2000 | Status: AC

## 2017-07-05 MED FILL — Sodium Bicarbonate IV Soln 8.4%: INTRAVENOUS | Qty: 50 | Status: AC

## 2017-07-05 MED FILL — Electrolyte-R (PH 7.4) Solution: INTRAVENOUS | Qty: 3000 | Status: AC

## 2017-07-05 MED FILL — Dexmedetomidine HCl in NaCl 0.9% IV Soln 400 MCG/100ML: INTRAVENOUS | Qty: 100 | Status: AC

## 2017-07-05 NOTE — Progress Notes (Signed)
Patient ID: Danny Krause, male   DOB: [birth year], 76 y.o.   MRN: [id number] T[facility] Evening Rounds:  His BP is better now that he is being transfused. Weaning neo Lasix after the blood.  Sinus rhythm 77  CBC    Component Value Date/Time   WBC 16.6 (H) 07/05/2017 1636   RBC 2.79 (L) 07/05/2017 1636   HGB 7.5 (L) 07/05/2017 1651   HGB 11.1 (L) 06/18/2017 1504   HCT 22.0 (L) 07/05/2017 1651   HCT 35.3 (L) 06/18/2017 1504   PLT 181 07/05/2017 1636   PLT 329 06/18/2017 1504   MCV 81.7 07/05/2017 1636   MCV 82 06/18/2017 1504   MCH 25.8 (L) 07/05/2017 1636   MCHC 31.6 07/05/2017 1636   RDW 14.3 07/05/2017 1636   RDW 14.6 06/18/2017 1504   LYMPHSABS 1.3 03/13/2017 1528   MONOABS 0.9 03/13/2017 1528   EOSABS 0.3 03/13/2017 1528   BASOSABS 0.0 03/13/2017 1528   BMET    Component Value Date/Time   NA 139 07/05/2017 1651   NA 141 06/18/2017 1504   K 4.6 07/05/2017 1651   CL 105 07/05/2017 1651   CO2 23 07/05/2017 0421   GLUCOSE 117 (H) 07/05/2017 1651   BUN 23 (H) 07/05/2017 1651   BUN 17 06/18/2017 1504   CREATININE 1.30 (H) 07/05/2017 1651   CALCIUM 7.9 (L) 07/05/2017 0421   GFRNONAA 51 (L) 07/05/2017 1636   GFRAA 59 (L) 07/05/2017 1636

## 2017-07-05 NOTE — Progress Notes (Signed)
Patient ID: Danny Krause, male   DOB: December 10, 1940, 76 y.o.   MRN: [id number] TCTS DAILY ICU PROGRESS NOTE                   [address]            [place] [address]          [phone]   1 Day Post-Op Procedure(s) (LRB): AORTIC VALVE REPLACEMENT (AVR) (N/A) CORONARY ARTERY BYPASS GRAFTING (CABG) x three, using left internal mammary artery and right leg greater saphenous vein harvested endoscopically (N/A) TRANSESOPHAGEAL ECHOCARDIOGRAM (TEE) (N/A)  Total Length of Stay:  LOS: 1 day   Subjective: Patient awake alert extubated approximately 11 PM last night,  Objective: Vital signs in last 24 hours: Temp:  [96.8 F (36 C)-99.7 F (37.6 C)] 99.5 F (37.5 C) (11/01 0830) Pulse Rate:  [62-92] 90 (11/01 0844) Cardiac Rhythm: Heart block;Normal sinus rhythm (11/01 0800) Resp:  [14-34] 30 (11/01 0844) BP: (82-141)/(35-68) 141/49 (11/01 0844) SpO2:  [91 %-100 %] 95 % (11/01 0844) Arterial Line BP: (93-153)/(25-52) 130/41 (11/01 0830) FiO2 (%):  [40 %-60 %] 40 % (10/31 2212) Weight:  [147 lb 11.3 oz (67 kg)-154 lb 6.4 oz (70 kg)] 154 lb 6.4 oz (70 kg) (11/01 0400)  Filed Weights   07/04/17 0628 07/04/17 1600 07/05/17 0400  Weight: 148 lb (67.1 kg) 147 lb 11.3 oz (67 kg) 154 lb 6.4 oz (70 kg)    Weight change: -4.7 oz (-0.132 kg)   Hemodynamic parameters for last 24 hours: PAP: (14-36)/(2-15) 20/6 CO:  [3.2 L/min-6.4 L/min] 5.8 L/min CI:  [1.8 L/min/m2-3.6 L/min/m2] 3.2 L/min/m2  Intake/Output from previous day: 10/31 0701 - 11/01 0700 In: 6788.7 [P.O.:50; I.V.:4838.7; Blood:130; NG/GT:20; IV Piggyback:1750] Out: 2610 [Urine:2440; Chest Tube:170]  Intake/Output this shift: Total I/O In: -  Out: 60 [Urine:60]  Current Meds: Scheduled Meds: . acetaminophen  1,000 mg Oral Q6H   Or  . acetaminophen (TYLENOL) oral liquid 160 mg/5 mL  1,000 mg Per Tube Q6H  . aspirin EC  325 mg Oral Daily   Or  . aspirin  324 mg Per Tube Daily  . bisacodyl  10 mg  Oral Daily   Or  . bisacodyl  10 mg Rectal Daily  . chlorhexidine gluconate (MEDLINE KIT)  15 mL Mouth Rinse BID  . Chlorhexidine Gluconate Cloth  6 each Topical Daily  . docusate sodium  200 mg Oral Daily  . insulin aspart  0-24 Units Subcutaneous Q4H  . levalbuterol  0.63 mg Nebulization TID  . levETIRAcetam  500 mg Oral BID  . mouth rinse  15 mL Mouth Rinse BID  . metoprolol tartrate  12.5 mg Oral BID   Or  . metoprolol tartrate  12.5 mg Per Tube BID  . [START ON 07/06/2017] pantoprazole  40 mg Oral Daily  . sodium chloride flush  10-40 mL Intracatheter Q12H  . sodium chloride flush  3 mL Intravenous Q12H   Continuous Infusions: . sodium chloride 20 mL/hr at 07/05/17 0700  . sodium chloride    . sodium chloride 10 mL/hr at 07/04/17 2200  . albumin human    . cefUROXime (ZINACEF)  IV Stopped (07/05/17 0455)  . dexmedetomidine (PRECEDEX) IV infusion Stopped (07/04/17 2130)  . DOPamine 3 mcg/kg/min (07/05/17 0700)  . famotidine (PEPCID) IV Stopped (07/04/17 1645)  . lactated ringers    . lactated ringers 20 mL/hr at 07/05/17 0700  . lactated ringers    . nitroGLYCERIN Stopped (  07/04/17 1545)  . phenylephrine (NEO-SYNEPHRINE) Adult infusion 25 mcg/min (07/05/17 0700)   PRN Meds:.sodium chloride, albumin human, lactated ringers, metoprolol tartrate, midazolam, morphine injection, ondansetron (ZOFRAN) IV, oxyCODONE, sodium chloride flush, sodium chloride flush, traMADol  General appearance: alert, cooperative and no distress Neurologic: intact Heart: regular rate and rhythm, S1, S2 normal, no murmur, click, rub or gallop Lungs: diminished breath sounds bibasilar Abdomen: soft, non-tender; bowel sounds normal; no masses,  no organomegaly Extremities: extremities normal, atraumatic, no cyanosis or edema and Homans sign is negative, no sign of DVT Wound: Sternum is stable, minimal chest tube drainage  Lab Results: CBC: Recent Labs  07/04/17 2102 07/04/17 2137 07/05/17 0421    WBC 12.0*  --  12.5*  HGB 7.9* 7.5* 7.4*  HCT 24.2* 22.0* 22.9*  PLT 180  --  174   BMET:  Recent Labs  07/03/17 1059  07/04/17 2137 07/05/17 0421  NA 135  < > 140 138  K 4.6  < > 4.6 4.6  CL 103  < > 108 110  CO2 22  --   --  23  GLUCOSE 113*  < > 106* 106*  BUN 17  < > 17 17  CREATININE 1.06  < > 0.80 1.03  CALCIUM 9.0  --   --  7.9*  < > = values in this interval not displayed.  CMET: Lab Results  Component Value Date   WBC 12.5 (H) 07/05/2017   HGB 7.4 (L) 07/05/2017   HCT 22.9 (L) 07/05/2017   PLT 174 07/05/2017   GLUCOSE 106 (H) 07/05/2017   CHOL 196 03/13/2017   TRIG 74 03/13/2017   HDL 47 03/13/2017   LDLCALC 134 (H) 03/13/2017   ALT 9 (L) 07/03/2017   AST 13 (L) 07/03/2017   NA 138 07/05/2017   K 4.6 07/05/2017   CL 110 07/05/2017   CREATININE 1.03 07/05/2017   BUN 17 07/05/2017   CO2 23 07/05/2017   TSH 3.310 06/18/2017   INR 1.45 07/04/2017   HGBA1C 5.9 (H) 07/03/2017      PT/INR:  Recent Labs  07/04/17 1600  LABPROT 17.5*  INR 1.45   Radiology: Dg Chest Port 1 View  Result Date: 07/04/2017 CLINICAL DATA:  Postop day 0 three-vessel CABG and aortic valve replacement. EXAM: PORTABLE CHEST 1 VIEW COMPARISON:  07/03/2017, 03/13/2017. FINDINGS: Sternotomy for CABG and aortic valve replacement. Endotracheal tube tip below the thoracic inlet measuring approximately 9 cm above carina. Right jugular Swan-Ganz catheter tip in the expected location of the main pulmonary trunk. Nasogastric tube courses below the diaphragm into the stomach. Cardiac silhouette normal in size for AP portable technique. Mild atelectasis at the right lung base and moderate atelectasis at the left lung base. Lungs otherwise clear. Pulmonary vascularity normal. Left chest tube in place with no pneumothorax. IMPRESSION: 1.  Support apparatus satisfactory. 2. Left chest tube in place with no pneumothorax. 3. Moderate atelectasis at the left lung base and mild atelectasis at the right  lung base. No acute cardiopulmonary disease otherwise. Electronically Signed   By: Evangeline Dakin M.D.   On: 07/04/2017 16:01     Assessment/Plan: S/P Procedure(s) (LRB): AORTIC VALVE REPLACEMENT (AVR) (N/A) CORONARY ARTERY BYPASS GRAFTING (CABG) x three, using left internal mammary artery and right leg greater saphenous vein harvested endoscopically (N/A) TRANSESOPHAGEAL ECHOCARDIOGRAM (TEE) (N/A) Mobilize Diuresis d/c tubes/lines Continue foley due to strict I&O and urinary output monitoring See progression orders Expected Acute  Blood - loss Anemia- continue to monitor  Continue  aggressive pulmonary treatment with his known underlying COPD -responsive to bronchodilators Wean off low-dose dopamine   Danny Krause 07/05/2017 8:53 AM

## 2017-07-05 NOTE — Progress Notes (Signed)
EVENING ROUNDS NOTE :     [address]       [place] [address]             [phone]                 1 Day Post-Op Procedure(s) (LRB): AORTIC VALVE REPLACEMENT (AVR) (N/A) CORONARY ARTERY BYPASS GRAFTING (CABG) x three, using left internal mammary artery and right leg greater saphenous vein harvested endoscopically (N/A) TRANSESOPHAGEAL ECHOCARDIOGRAM (TEE) (N/A)  Total Length of Stay:  LOS: 1 day  BP (!) 85/45   Pulse 75   Temp 98 F (36.7 C) (Axillary)   Resp 18   Ht 5\' 7"  (1.702 m)   Wt 154 lb 6.4 oz (70 kg)   SpO2 100%   BMI 24.18 kg/m   .Intake/Output      10/31 0701 - 11/01 0700 11/01 0701 - 11/02 0700   P.O. 50 480   I.V. (mL/kg) 4838.7 (69.1) 402.8 (5.8)   Blood 130    NG/GT 20    IV Piggyback 1750 100   Total Intake(mL/kg) 6788.7 (97) 982.8 (14)   Urine (mL/kg/hr) 2440 (1.5) 310 (0.4)   Emesis/NG output 0    Chest Tube 170 100   Total Output 2610 410   Net +4178.7 +572.8          . sodium chloride 20 mL/hr at 07/05/17 0700  . sodium chloride    . sodium chloride Stopped (07/05/17 1030)  . cefUROXime (ZINACEF)  IV 1.5 g (07/05/17 1644)  . dexmedetomidine (PRECEDEX) IV infusion Stopped (07/04/17 2130)  . DOPamine Stopped (07/05/17 1100)  . lactated ringers    . lactated ringers 20 mL/hr at 07/05/17 0700  . lactated ringers    . nitroGLYCERIN Stopped (07/04/17 1545)  . phenylephrine (NEO-SYNEPHRINE) Adult infusion 30 mcg/min (07/05/17 1625)     Lab Results  Component Value Date   WBC 16.6 (H) 07/05/2017   HGB 7.5 (L) 07/05/2017   HCT 22.0 (L) 07/05/2017   PLT 181 07/05/2017   GLUCOSE 117 (H) 07/05/2017   CHOL 196 03/13/2017   TRIG 74 03/13/2017   HDL 47 03/13/2017   LDLCALC 134 (H) 03/13/2017   ALT 9 (L) 07/03/2017   AST 13 (L) 07/03/2017   NA 139 07/05/2017   K 4.6 07/05/2017   CL 105 07/05/2017   CREATININE 1.30 (H) 07/05/2017   BUN 23 (H) 07/05/2017   CO2 23 07/05/2017   TSH 3.310 06/18/2017   INR 1.45 07/04/2017    HGBA1C 5.9 (H) 07/03/2017   Off dopamine, still on neo Cr stable 1.3 H/H low preop, one unit prbc give during case , will give second unit tonight try to get off neo and lasix after blood in   Grace Isaac MD  [place] Office [phone] 07/05/2017 5:12 PM

## 2017-07-05 NOTE — Discharge Summary (Signed)
Physician Discharge Summary       [address]       [place] [address]             [phone]    Patient ID: YOSHIHARU BRASSELL MRN: [id number] DOB/AGE: April 10, 1941 76 y.o.  Admit date: 07/04/2017 Discharge date: 07/10/2017  Admission Diagnoses: 1. Severe aortic stenosis 2. Coronary artery disease  Discharge Diagnoses:  1. Seizure ([place]) 2. Anginal pain ([place])   Procedure (s):  TRANSESOPHAGEAL ECHOCARDIOGRAM (TEE) MEDIAN STERNOTOMY for CORONARY ARTERY BYPASS GRAFTING (CABG) x 3 (LIMA to LAD, SVG to DIAGONAL, SVG to PDA) using left internal mammary artery and RIGHT leg greater saphenous vein harvested endoscopically and AORTIC VALVE REPLACEMENT (AVR) (usig Model# [id number], Serial # [id number], size 23) by Dr. Servando Snare on 07/04/2017.  History of Presenting Illness: This is a 76 year old male is seen in the office for evaluation of critical aortic stenosis in a patient who had a major syncopal episode with seizures in July while working in his yard.  The patient gives a history of knowing that he had a heart murmur since age 48.  He has had very little in the way of medical care over the years.  In early July he was cutting the grass in his yard, his family found him unconscious in the yard.  It was not clear how long he had been unconscious.  EMS came and it was documented that he had a seizure.  He was admitted to [facility] for several days, discharged home without a cardiology evaluation on seizure medication.  His wife sees Dr. Debara Pickett so ultimately he was seen by cardiology and evaluated with echocardiogram and cardiac catheterization last week.  The patient's had no further syncopal episodes.  He does become short of breath with minimal activity.  With exertion he also notes tightness in his throat and upper chest.  He has been a long-term smoker since age 75.  Prior to the episode in July, he been very active individual.   Dr. Servando Snare reviewed the patient's history,  symptoms, past records, cath and echo findings.  Patient has very critical aortic stenosis, with very worrisome symptoms.  Dr. Servando Snare recommended to the patient that we proceed relatively quickly with coronary artery bypass grafting and aortic valve replacement.  With his age, Dr. Servando Snare recommended tissue valve would be the most appropriate. tissue valve would be the most appropriate.  The plan was to proceed with aortic valve replacement, coronary artery bypass grafting, and  possible replacement of a sending aorta (if necessary) on Wednesday October 31. Risks and options were discussed with the patient his son and brother-in-law in detail. Patient agreed to proceed with surgery. He was admitted on 07/04/2017 in order to undergo a CABG x 3 and an aortic valve replacement.   Brief Hospital Course:  The patient was extubated the evening of surgery without difficulty. He/she remained afebrile and hemodynamically stable. He was weaned off Neo Synephrine drip. Gordy Councilman, a line, and chest tubes were removed early in the post operative course. Foley remained for a few days then was removed. Lopressor was started and titrated accordingly. He was volume over loaded and diuresed. He had ABL anemia. He did not require a post op transfusion (although he did have a transfusion pre op). Last H and H was stable at 7.7 and 24.4. He was started on oral iron and folate. His creatinine was mildly elevated post op;however, it did continue to decrease and was last down to 1.32.He was weaned off the  insulin drip.  The patient's HGA1C pre op was 5.9. 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 He needs to have further surveillance of his HGA1C by his medical doctor after discharge. Diet recommendations were included with his discharge paperwork. The patient was felt surgically stable for transfer from the ICU to PCTU for further convalescence on 07/07/2017. He continues to progress with cardiac rehab. He was ambulating on 2 liters of oxygen via [place]. He does desat on room air with  ambulation. We will arrange home oxygen and currently he is on 1 liter of oxygen via [place].  He has been tolerating a diet and has had a bowel movement. Will try to start low dose ACE after discharge;last creatinine down to 1.32 but still elevated so will not start now. Epicardial pacing wires were removed on 07/08/2017. Chest tube sutures will be removed the day of discharge. The patient is felt surgically stable for discharge today.  Latest Vital Signs: Blood pressure 137/75, pulse 74, temperature 98.9 F (37.2 C), temperature source Oral, resp. rate (!) 25, height 5\' 7"  (1.702 m), weight 144 lb 8 oz (65.5 kg), SpO2 92 %.  Physical Exam: Cardiovascular: RRR Pulmonary: Slightly diminished at bases. Abdomen: Soft, non tender, bowel sounds present. Extremities:Trace bilateral lower extremity edema. Wounds: Clean and dry.  No erythema or signs of infection.   Discharge Condition: Stable and discharged to home.  Recent laboratory studies:  Lab Results  Component Value Date   WBC 10.5 07/08/2017   HGB 7.7 (L) 07/08/2017   HCT 24.2 (L) 07/08/2017   MCV 83.2 07/08/2017   PLT 250 07/08/2017   Lab Results  Component Value Date   NA 135 07/08/2017   K 3.5 07/08/2017   CL 101 07/08/2017   CO2 26 07/08/2017   CREATININE 1.32 (H) 07/08/2017   GLUCOSE 97 07/08/2017    Diagnostic Studies:  CLINICAL DATA:  Aortic valve replacement  EXAM: CHEST  2 VIEW  COMPARISON:  07/07/2017  FINDINGS: Right jugular sheath removed. Stable vascular congestion. Increasing mild interstitial edema. Stable small pleural effusions left greater than right. No pneumothorax.  IMPRESSION: Stable vascular congestion.  Increasing mild interstitial edema.   Electronically Signed   By: Marybelle Killings M.D.   On: 07/08/2017 08:00   Ct Angio Chest Aorta W/cm &/or Wo/cm  Result Date: 07/02/2017 CLINICAL DATA:  Preoperative study prior to aortic valve replacement. EXAM: CT ANGIOGRAPHY CHEST WITH CONTRAST  TECHNIQUE: Multidetector CT imaging of the chest was performed using the standard protocol during bolus administration of intravenous contrast. Multiplanar CT image reconstructions and MIPs were obtained to evaluate the vascular anatomy. CONTRAST:  75 cc Isovue 370 intravenous contrast. COMPARISON:  None. FINDINGS: Cardiovascular: Preferential opacification of the thoracic aorta. No evidence of thoracic aortic aneurysm or dissection. The ascending thoracic aorta measures up to 3.8 cm. Heart size is at the upper limits of normal. No pericardial effusion. Coronary, aortic arch, and branch vessel atherosclerotic vascular disease. Extensive aortic valve calcifications. Mediastinum/Nodes: No enlarged mediastinal, hilar, or axillary lymph nodes. Thyroid gland, trachea, and esophagus demonstrate no significant findings. 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 Mild diffuse peribronchial thickening. There is a 10 x 7 x 13 mm solid nodule in the left lower lobe (series 5, image 89). Additional 17 x 10 x 9 mm irregular nodule in the right lower lobe (series 5, image 16). No pleural effusion or pneumothorax. Upper Abdomen: No acute abnormality. Musculoskeletal: No chest wall abnormality. No acute or significant osseous findings. Review of the  MIP images confirms the above findings. IMPRESSION: 1. No evidence of thoracic aortic aneurysm. Aortic atherosclerosis (ICD10-I70.0). Extensive aortic valve calcifications. 2. 1.3 cm solid nodule in the left lower lobe. 1.7 cm solid nodule in the right lower lobe. Consider one of the following in 3 months for both low-risk and high-risk individuals: (a) repeat chest CT, (b) follow-up PET-CT, or (c) tissue sampling. This recommendation follows the consensus statement: Guidelines for Management of Incidental Pulmonary Nodules Detected on CT Images: From the Fleischner Society 2017; Radiology 2017; 284:228-243. 3. 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 Electronically Signed   By: Titus Dubin M.D.   On: 07/02/2017 14:24    Discharge Medications: Allergies as of 07/10/2017   No Known Allergies     Medication List    TAKE these medications   acetaminophen 650 MG CR tablet Commonly known as:  TYLENOL Take 650 mg by mouth every 8 (eight) hours as needed for pain.   albuterol 108 (90 Base) MCG/ACT inhaler Commonly known as:  PROVENTIL HFA;VENTOLIN HFA Inhale 2 puffs into the lungs every 6 (six) hours as needed for wheezing or shortness of breath.   aspirin 325 MG EC tablet Take 1 tablet (325 mg total) daily by mouth. What changed:    medication strength  how much to take   cyanocobalamin 1000 MCG tablet Take 2 tablets (2,000 mcg total) by mouth daily.   levETIRAcetam 500 MG tablet Commonly known as:  KEPPRA Take 1 tablet (500 mg total) by mouth 2 (two) times daily.   metoprolol tartrate 25 MG tablet Commonly known as:  LOPRESSOR Take 0.5 tablets (12.5 mg total) 2 (two) times daily by mouth.   oxyCODONE 5 MG immediate release tablet Commonly known as:  Oxy IR/ROXICODONE Take 5 mg by mouth every 4-6 hours PRN severe pain.   simvastatin 20 MG tablet Commonly known as:  ZOCOR Take 1 tablet (20 mg total) daily at 6 PM by mouth.            Durable Medical Equipment  (From admission, onward)        Start     Ordered   07/10/17 0735  For home use only DME oxygen  Once    Question Answer Comment  Mode or (Route) Nasal cannula   Liters per Minute 1   Frequency Continuous (stationary and portable oxygen unit needed)   Oxygen delivery system Gas      07/10/17 0734     The patient has been discharged on:   1.Beta Blocker:  Yes [ x  ]                              No   [   ]                              If No, reason:  2.Ace Inhibitor/ARB: Yes [   ]                                     No  [x    ]                                     If No, reason: Elevated creatinine  3.Statin:    Yes [  x ]                  No  [   ]                  If No, reason:  4.Ecasa:  Yes  [ x  ]                  No   [   ]                  If No, reason:  Follow Up Appointments: Follow-up Information    Grace Isaac, MD. Go on 08/16/2017.   Specialty:  Cardiothoracic Surgery Why:  PA/LAT CXR to be taken (at [place] which is in the same building as Dr. Everrett Coombe office on 08/09/2017 08/16/2017 at 2:00 pm;Appointment time is at 2:30 pm Contact information: [place] [address] [phone]        Jani Gravel, MD. Call.   Specialty:  Internal Medicine Why:  for a follow up appointment of further surveillance of HGA1C 5.9 Contact information: [place] [address] [phone]        Almyra Deforest, [place]. Go on 07/19/2017.   Specialties:  Cardiology, Radiology Why:  Appointment time is at 9:30 am Contact information: [address] [place] [address] [phone]           Signed: Lars Pinks MPA-C 07/10/2017, 7:54 AM

## 2017-07-05 NOTE — Progress Notes (Signed)
Anesthesiology Follow-up:  Awake and alert, neuro intact, answering questions appropriately, mild incisional pain.  Dopamine at 1 mcg/kg/min  VS: T- 37.4 BP- 103/58 HR- 89 (SR with first degree AVB) RR- 28 O2 Sat 96% on 4L PA 28/14 CO/CI 5.8/2.1   K- 4.6 Na- 138 BUN/Cr. 17/1.03 glucose- 106 H/H- 7.4/22.09 Platelets-174,000   Extubated at 23:20 last night, 7 hours post-op  75 year old male one day S/p AVR with 23 Edwards Magna Ease and CABG X 3.  Stable post-op course so far.  Danny Krause

## 2017-07-05 NOTE — Care Management Note (Signed)
Case Management Note Marvetta Gibbons RN, BSN Unit 4E-Case Manager-- [place] coverage [phone]  Patient Details  Name: Danny Krause MRN: [id number] Date of Birth: 06/23/1941  Subjective/Objective:   Pt admitted s/p AVR, CABGx3 on 10/31                 Action/Plan: PTA pt lived at home with wife- independent-  CM to follow for d/c needs  Expected Discharge Date:                  Expected Discharge Plan:     In-House Referral:     Discharge planning Services  CM Consult  Post Acute Care Choice:    Choice offered to:     DME Arranged:    DME Agency:     HH Arranged:    HH Agency:     Status of Service:  In process, will continue to follow  If discussed at Long Length of Stay Meetings, dates discussed:    Discharge Disposition:   Additional Comments:  Danny Patricia, RN 07/05/2017, 10:16 AM

## 2017-07-06 ENCOUNTER — Ambulatory Visit: Payer: Medicare Other | Admitting: Internal Medicine

## 2017-07-06 ENCOUNTER — Inpatient Hospital Stay (HOSPITAL_COMMUNITY): Payer: Medicare Other

## 2017-07-06 LAB — CBC
HCT: 25.1 % — ABNORMAL LOW (ref 39.0–52.0)
Hemoglobin: 8 g/dL — ABNORMAL LOW (ref 13.0–17.0)
MCH: 26.4 pg (ref 26.0–34.0)
MCHC: 31.9 g/dL (ref 30.0–36.0)
MCV: 82.8 fL (ref 78.0–100.0)
Platelets: 173 10*3/uL (ref 150–400)
RBC: 3.03 MIL/uL — ABNORMAL LOW (ref 4.22–5.81)
RDW: 14.2 % (ref 11.5–15.5)
WBC: 15.8 10*3/uL — ABNORMAL HIGH (ref 4.0–10.5)

## 2017-07-06 LAB — BASIC METABOLIC PANEL
Anion gap: 7 (ref 5–15)
BUN: 27 mg/dL — ABNORMAL HIGH (ref 6–20)
CO2: 23 mmol/L (ref 22–32)
Calcium: 8.1 mg/dL — ABNORMAL LOW (ref 8.9–10.3)
Chloride: 107 mmol/L (ref 101–111)
Creatinine, Ser: 1.52 mg/dL — ABNORMAL HIGH (ref 0.61–1.24)
GFR calc Af Amer: 50 mL/min — ABNORMAL LOW (ref >60)
GFR calc non Af Amer: 43 mL/min — ABNORMAL LOW (ref >60)
Glucose, Bld: 98 mg/dL (ref 65–99)
Potassium: 4.5 mmol/L (ref 3.5–5.1)
Sodium: 137 mmol/L (ref 135–145)

## 2017-07-06 LAB — GLUCOSE, CAPILLARY
Glucose-Capillary: 100 mg/dL — ABNORMAL HIGH (ref 65–99)
Glucose-Capillary: 107 mg/dL — ABNORMAL HIGH (ref 65–99)
Glucose-Capillary: 100 mg/dL — ABNORMAL HIGH (ref 65–99)
Glucose-Capillary: 113 mg/dL — ABNORMAL HIGH (ref 65–99)
Glucose-Capillary: 127 mg/dL — ABNORMAL HIGH (ref 65–99)
Glucose-Capillary: 100 mg/dL — ABNORMAL HIGH (ref 65–99)

## 2017-07-06 MED ORDER — SODIUM CHLORIDE 0.9% FLUSH: 10.0000 mL | INTRAVENOUS | Status: DC | PRN

## 2017-07-06 MED ORDER — ORAL CARE MOUTH RINSE
15.0000 mL | Freq: Two times a day (BID) | OROMUCOSAL | Status: DC
  Administered 2017-07-06 – 2017-07-10 (×7): 15 mL via OROMUCOSAL

## 2017-07-06 MED ORDER — FUROSEMIDE 10 MG/ML IJ SOLN
20.0000 mg | Freq: Two times a day (BID) | INTRAMUSCULAR | Status: AC
  Administered 2017-07-06 (×2): 20 mg via INTRAVENOUS
  Filled 2017-07-06 (×2): qty 2

## 2017-07-06 MED ORDER — CHLORHEXIDINE GLUCONATE CLOTH 2 % EX PADS
6.0000 | MEDICATED_PAD | Freq: Every day | CUTANEOUS | Status: DC
  Administered 2017-07-07 – 2017-07-08 (×2): 6 via TOPICAL

## 2017-07-06 MED ORDER — INFLUENZA VAC SPLIT HIGH-DOSE 0.5 ML IM SUSY
0.5000 mL | PREFILLED_SYRINGE | INTRAMUSCULAR | Status: AC
  Administered 2017-07-08: 0.5 mL via INTRAMUSCULAR
  Filled 2017-07-06: qty 0.5

## 2017-07-06 MED ORDER — SODIUM CHLORIDE 0.9% FLUSH
10.0000 mL | Freq: Two times a day (BID) | INTRAVENOUS | Status: DC
  Administered 2017-07-06 – 2017-07-10 (×5): 10 mL

## 2017-07-06 NOTE — Progress Notes (Addendum)
Patient ID: Danny Krause, male   DOB: 1940-10-29, 76 y.o.   MRN: [id number] TCTS DAILY ICU PROGRESS NOTE                   [address]            [place] [address]          [phone]   2 Days Post-Op Procedure(s) (LRB): AORTIC VALVE REPLACEMENT (AVR) (N/A) CORONARY ARTERY BYPASS GRAFTING (CABG) x three, using left internal mammary artery and right leg greater saphenous vein harvested endoscopically (N/A) TRANSESOPHAGEAL ECHOCARDIOGRAM (TEE) (N/A)  Total Length of Stay:  LOS: 2 days   Subjective: Patient feels better this morning after transfusion and further diuresis, walked in the unit this morning.  Arterial line still in place just weaned Neo-Synephrine off.  Objective: Vital signs in last 24 hours: Temp:  [98 F (36.7 C)-99.5 F (37.5 C)] 98.6 F (37 C) (11/02 0345) Pulse Rate:  [60-101] 65 (11/02 0700) Cardiac Rhythm: Heart block;Normal sinus rhythm (11/02 0400) Resp:  [17-36] 26 (11/02 0700) BP: (80-141)/(26-66) 91/48 (11/02 0700) SpO2:  [87 %-100 %] 91 % (11/02 0700) Arterial Line BP: (85-148)/(21-55) 130/42 (11/02 0700) Weight:  [153 lb 3.5 oz (69.5 kg)] 153 lb 3.5 oz (69.5 kg) (11/02 0600)  Filed Weights   07/04/17 1600 07/05/17 0400 07/06/17 0600  Weight: 147 lb 11.3 oz (67 kg) 154 lb 6.4 oz (70 kg) 153 lb 3.5 oz (69.5 kg)    Weight change: 5 lb 8.2 oz (2.5 kg)   Hemodynamic parameters for last 24 hours: PAP: (19-39)/(6-19) 23/12 CO:  [5.8 L/min] 5.8 L/min CI:  [3.2 L/min/m2] 3.2 L/min/m2  Intake/Output from previous day: 11/01 0701 - 11/02 0700 In: 2127.8 [P.O.:780; I.V.:932.8; Blood:315; IV Piggyback:100] Out: 9381 [Urine:1095; Chest Tube:100]  Intake/Output this shift: No intake/output data recorded.  Current Meds: Scheduled Meds: . acetaminophen  1,000 mg Oral Q6H   Or  . acetaminophen (TYLENOL) oral liquid 160 mg/5 mL  1,000 mg Per Tube Q6H  . aspirin EC  325 mg Oral Daily   Or  . aspirin  324 mg Per Tube Daily  .  bisacodyl  10 mg Oral Daily   Or  . bisacodyl  10 mg Rectal Daily  . chlorhexidine gluconate (MEDLINE KIT)  15 mL Mouth Rinse BID  . Chlorhexidine Gluconate Cloth  6 each Topical Daily  . docusate sodium  200 mg Oral Daily  . enoxaparin (LOVENOX) injection  30 mg Subcutaneous QHS  . insulin aspart  0-24 Units Subcutaneous Q4H  . levalbuterol  0.63 mg Nebulization TID  . levETIRAcetam  500 mg Oral BID  . mouth rinse  15 mL Mouth Rinse BID  . metoprolol tartrate  12.5 mg Oral BID   Or  . metoprolol tartrate  12.5 mg Per Tube BID  . pantoprazole  40 mg Oral Daily  . sodium chloride flush  10-40 mL Intracatheter Q12H  . sodium chloride flush  3 mL Intravenous Q12H   Continuous Infusions: . sodium chloride    . sodium chloride Stopped (07/05/17 1030)  . lactated ringers    . lactated ringers 20 mL/hr at 07/06/17 0700  . lactated ringers    . nitroGLYCERIN Stopped (07/04/17 1545)  . phenylephrine (NEO-SYNEPHRINE) Adult infusion Stopped (07/06/17 0645)   PRN Meds:.lactated ringers, metoprolol tartrate, morphine injection, ondansetron (ZOFRAN) IV, oxyCODONE, sodium chloride flush, sodium chloride flush, traMADol  General appearance: alert, cooperative, appears stated age and no distress Neurologic: intact  Heart: regular rate and rhythm, S1, S2 normal, no murmur, click, rub or gallop Lungs: diminished breath sounds bibasilar Abdomen: soft, non-tender; bowel sounds normal; no masses,  no organomegaly Extremities: extremities normal, atraumatic, no cyanosis or edema and Homans sign is negative, no sign of DVT Wound: Sternum is stable, left chest tube in place without air leak  Lab Results: CBC: Recent Labs  07/05/17 1636 07/05/17 1651 07/06/17 0413  WBC 16.6*  --  15.8*  HGB 7.2* 7.5* 8.0*  HCT 22.8* 22.0* 25.1*  PLT 181  --  173   BMET:  Recent Labs  07/05/17 0421  07/05/17 1651 07/06/17 0413  NA 138  --  139 137  K 4.6  --  4.6 4.5  CL 110  --  105 107  CO2 23  --    --  23  GLUCOSE 106*  --  117* 98  BUN 17  --  23* 27*  CREATININE 1.03  < > 1.30* 1.52*  CALCIUM 7.9*  --   --  8.1*  < > = values in this interval not displayed.  CMET: Lab Results  Component Value Date   WBC 15.8 (H) 07/06/2017   HGB 8.0 (L) 07/06/2017   HCT 25.1 (L) 07/06/2017   PLT 173 07/06/2017   GLUCOSE 98 07/06/2017   CHOL 196 03/13/2017   TRIG 74 03/13/2017   HDL 47 03/13/2017   LDLCALC 134 (H) 03/13/2017   ALT 9 (L) 07/03/2017   AST 13 (L) 07/03/2017   NA 137 07/06/2017   K 4.5 07/06/2017   CL 107 07/06/2017   CREATININE 1.52 (H) 07/06/2017   BUN 27 (H) 07/06/2017   CO2 23 07/06/2017   TSH 3.310 06/18/2017   INR 1.45 07/04/2017   HGBA1C 5.9 (H) 07/03/2017      PT/INR:  Recent Labs  07/04/17 1600  LABPROT 17.5*  INR 1.45   Radiology: No results found. X-ray not read yet but reviewed no pneumothorax or effusions,  Acute Kidney Injury (any one)  Increase in SCr by > 0.3 within 48 hours  Increase SCr to > 1.5 times baseline  Urine volume < 0.5 ml/kg/h for 6 hrs  ?Stage 1 - Increase in serum creatinine to 1.5 to 1.9 times baseline, or increase in serum creatinine by ?0.3 mg/dL (?26.5 micromol/L), or reduction in urine output to <0.5 mL/kg per hour for 6 to 12 hours.  ?Stage 2 - Increase in serum creatinine to 2.0 to 2.9 times baseline, or reduction in urine output to <0.5 mL/kg per hour for ?12 hours.  ?Stage 3 - Increase in serum creatinine to 3.0 times baseline, or increase in serum creatinine to ?4.0 mg/dL (?353.6 micromol/L), or reduction in urine output to <0.3 mL/kg per hour for ?24 hours, or anuria for ?12 hours, or the initiation of renal replacement therapy, or, in patients <18 years, decrease in eGFR to <35 mL   Lab Results  Component Value Date   CREATININE 1.52 (H) 07/06/2017   Estimated Creatinine Clearance: 38.7 mL/min (A) (by C-G formula based on SCr of 1.52 mg/dL (H)).  Wt Readings from Last 3 Encounters:  07/06/17 153 lb 3.5 oz  (69.5 kg)  07/03/17 150 lb (68 kg)  07/02/17 150 lb (68 kg)   Preop weight 148 pounds  Assessment/Plan: S/P Procedure(s) (LRB): AORTIC VALVE REPLACEMENT (AVR) (N/A) CORONARY ARTERY BYPASS GRAFTING (CABG) x three, using left internal mammary artery and right leg greater saphenous vein harvested endoscopically (N/A) TRANSESOPHAGEAL ECHOCARDIOGRAM (TEE) (N/A) Mobilize Diuresis  d/c tubes/lines Stage I acute kidney injury with creatinine to 1.52today-700 mL of urine output past 12 hours We will leave Foley another 24 hours and to monitor urine output Mild diuresis-weight above preop only 5 pounds Hemoglobin up after transfusion last night, patient was anemic preop 1 unit of blood given intraoperative, one last night On DVT prophylaxis Lovenox dose reduced with renal function  Grace Isaac 07/06/2017 7:19 AM

## 2017-07-06 NOTE — Progress Notes (Signed)
TCTS BRIEF SICU PROGRESS NOTE  2 Days Post-Op  S/P Procedure(s) (LRB): AORTIC VALVE REPLACEMENT (AVR) (N/A) CORONARY ARTERY BYPASS GRAFTING (CABG) x three, using left internal mammary artery and right leg greater saphenous vein harvested endoscopically (N/A) TRANSESOPHAGEAL ECHOCARDIOGRAM (TEE) (N/A)   Stable day NSR w/ stable BP Breathing comfortably w/ O2 sats 92% on RA UOP adequate  Plan: Continue current plan  Rexene Alberts, MD 07/06/2017 6:00 PM

## 2017-07-07 ENCOUNTER — Inpatient Hospital Stay (HOSPITAL_COMMUNITY): Payer: Medicare Other

## 2017-07-07 LAB — BASIC METABOLIC PANEL
Anion gap: 7 (ref 5–15)
BUN: 32 mg/dL — ABNORMAL HIGH (ref 6–20)
CO2: 25 mmol/L (ref 22–32)
Calcium: 7.9 mg/dL — ABNORMAL LOW (ref 8.9–10.3)
Chloride: 104 mmol/L (ref 101–111)
Creatinine, Ser: 1.57 mg/dL — ABNORMAL HIGH (ref 0.61–1.24)
GFR calc Af Amer: 48 mL/min — ABNORMAL LOW (ref >60)
GFR calc non Af Amer: 41 mL/min — ABNORMAL LOW (ref >60)
Glucose, Bld: 98 mg/dL (ref 65–99)
Potassium: 4 mmol/L (ref 3.5–5.1)
Sodium: 136 mmol/L (ref 135–145)

## 2017-07-07 LAB — CBC
HCT: 22.9 % — ABNORMAL LOW (ref 39.0–52.0)
Hemoglobin: 7.2 g/dL — ABNORMAL LOW (ref 13.0–17.0)
MCH: 26.1 pg (ref 26.0–34.0)
MCHC: 31.4 g/dL (ref 30.0–36.0)
MCV: 83 fL (ref 78.0–100.0)
Platelets: 173 10*3/uL (ref 150–400)
RBC: 2.76 MIL/uL — ABNORMAL LOW (ref 4.22–5.81)
RDW: 14.6 % (ref 11.5–15.5)
WBC: 11.2 10*3/uL — ABNORMAL HIGH (ref 4.0–10.5)

## 2017-07-07 LAB — GLUCOSE, CAPILLARY
Glucose-Capillary: 96 mg/dL (ref 65–99)
Glucose-Capillary: 93 mg/dL (ref 65–99)
Glucose-Capillary: 103 mg/dL — ABNORMAL HIGH (ref 65–99)

## 2017-07-07 MED ORDER — SODIUM CHLORIDE 0.9 % IV SOLN: 250.0000 mL | INTRAVENOUS | Status: DC | PRN

## 2017-07-07 MED ORDER — FA-PYRIDOXINE-CYANOCOBALAMIN 2.5-25-2 MG PO TABS
1.0000 | ORAL_TABLET | Freq: Every day | ORAL | Status: DC
  Administered 2017-07-07 – 2017-07-10 (×4): 1 via ORAL
  Filled 2017-07-07 (×4): qty 1

## 2017-07-07 MED ORDER — SODIUM CHLORIDE 0.9% FLUSH: 3.0000 mL | INTRAVENOUS | Status: DC | PRN

## 2017-07-07 MED ORDER — POLYSACCHARIDE IRON COMPLEX 150 MG PO CAPS
150.0000 mg | ORAL_CAPSULE | Freq: Every day | ORAL | Status: DC
  Administered 2017-07-08 – 2017-07-10 (×3): 150 mg via ORAL
  Filled 2017-07-07 (×3): qty 1

## 2017-07-07 MED ORDER — FUROSEMIDE 10 MG/ML IJ SOLN
20.0000 mg | Freq: Two times a day (BID) | INTRAMUSCULAR | Status: AC
  Administered 2017-07-07 (×2): 20 mg via INTRAVENOUS
  Filled 2017-07-07 (×2): qty 2

## 2017-07-07 MED ORDER — MOVING RIGHT ALONG BOOK
Freq: Once | Status: AC
  Administered 2017-07-07: 11:00:00
  Filled 2017-07-07: qty 1

## 2017-07-07 MED ORDER — SODIUM CHLORIDE 0.9% FLUSH
3.0000 mL | Freq: Two times a day (BID) | INTRAVENOUS | Status: DC
  Administered 2017-07-07 – 2017-07-10 (×7): 3 mL via INTRAVENOUS

## 2017-07-07 NOTE — Progress Notes (Signed)
RT assessed patient per order, patient is on [facility] with sats at 94%, patient has TID treatments ordered at this time and per RT assessment patient scores for TID treatments. Breath sounds are clear and slightly diminished in lower bases. RT will continue to monitor.

## 2017-07-07 NOTE — Progress Notes (Signed)
Pt arrived to 4E04 from [place], alert and oriented, vitals stable. Family member at the bedside, no distress noted, will continue to monitor pt.

## 2017-07-07 NOTE — Progress Notes (Signed)
Pt transported to 4E04 via wheelchair accompanied by RN. Transferred to recliner chair and connected to current unit monitor. Tolerated well. No s/s of distress at this time.

## 2017-07-07 NOTE — Progress Notes (Signed)
[address]       [place] [address]             [phone]        CARDIOTHORACIC SURGERY PROGRESS NOTE   R3 Days Post-Op Procedure(s) (LRB): AORTIC VALVE REPLACEMENT (AVR) (N/A) CORONARY ARTERY BYPASS GRAFTING (CABG) x three, using left internal mammary artery and right leg greater saphenous vein harvested endoscopically (N/A) TRANSESOPHAGEAL ECHOCARDIOGRAM (TEE) (N/A)  Subjective: Looks good and feels well.  Slept well.  Ambulated around ICU this morning.  Some soreness in chest and upper back  Objective: Vital signs: BP Readings from Last 1 Encounters:  07/07/17 106/60   Pulse Readings from Last 1 Encounters:  07/07/17 74   Resp Readings from Last 1 Encounters:  07/07/17 18   Temp Readings from Last 1 Encounters:  07/07/17 98.1 F (36.7 C) (Oral)    Hemodynamics:    Physical Exam:  Rhythm:   sinus  Breath sounds: clear  Heart sounds:  RRR  Incisions:  Dressing dry, intact  Abdomen:  Soft, non-distended, non-tender  Extremities:  Warm, well-perfused    Intake/Output from previous day: 11/02 0701 - 11/03 0700 In: 525 [P.O.:480; I.V.:45] Out: 1555 [Urine:1475; Chest Tube:80] Intake/Output this shift: No intake/output data recorded.  Lab Results:  CBC: Recent Labs  07/06/17 0413 07/07/17 0342  WBC 15.8* 11.2*  HGB 8.0* 7.2*  HCT 25.1* 22.9*  PLT 173 173    BMET:  Recent Labs  07/06/17 0413 07/07/17 0342  NA 137 136  K 4.5 4.0  CL 107 104  CO2 23 25  GLUCOSE 98 98  BUN 27* 32*  CREATININE 1.52* 1.57*  CALCIUM 8.1* 7.9*     PT/INR:   Recent Labs  07/04/17 1600  LABPROT 17.5*  INR 1.45    CBG (last 3)   Recent Labs  07/06/17 2318 07/07/17 0346 07/07/17 0756  GLUCAP 100* 96 93    ABG    Component Value Date/Time   PHART 7.350 07/05/2017 0050   PCO2ART 40.5 07/05/2017 0050   PO2ART 61.0 (L) 07/05/2017 0050   HCO3 22.4 07/05/2017 0050   TCO2 24 07/05/2017 1651   ACIDBASEDEF 3.0 (H) 07/05/2017  0050   O2SAT 90.0 07/05/2017 0050    CXR: PORTABLE CHEST 1 VIEW  COMPARISON:  July 06, 2017  FINDINGS: The left-sided chest tube is been removed in the interval. No pneumothorax identified. A right IJ sheath is identified. Cardiomegaly is stable. The hila and mediastinum are normal. A small left pleural effusion is slightly smaller in the interval. Mild increased interstitial markings on the right suggests mild asymmetric pulmonary venous congestion. No other abnormalities identified.  IMPRESSION: 1. The left chest tube has been removed without pneumothorax. 2. Suggested mild pulmonary venous congestion, asymmetric to the right.   Electronically Signed   By: Dorise Bullion III M.D   On: 07/07/2017 07:42  Assessment/Plan: S/P Procedure(s) (LRB): AORTIC VALVE REPLACEMENT (AVR) (N/A) CORONARY ARTERY BYPASS GRAFTING (CABG) x three, using left internal mammary artery and right leg greater saphenous vein harvested endoscopically (N/A) TRANSESOPHAGEAL ECHOCARDIOGRAM (TEE) (N/A)  Doing well POD3 Maintaining NSR w/ stable BP Breathing comfortably w/ O2 sats 97% on 2 L/min Ambulating fairly well w/ minimal assistance Expected post op acute blood loss anemia, Hgb down 7.2 this morning Chronic diastolic CHF with expected post-op volume excess, diuresed 1 liter yesterday and weight down   Mobilize  Diuresis  Iron and folate  Watch Hgb  Transfer step down  Rexene Alberts, MD 07/07/2017 11:03 AM

## 2017-07-08 ENCOUNTER — Inpatient Hospital Stay (HOSPITAL_COMMUNITY): Payer: Medicare Other

## 2017-07-08 ENCOUNTER — Other Ambulatory Visit: Payer: Self-pay

## 2017-07-08 LAB — CBC
HCT: 24.2 % — ABNORMAL LOW (ref 39.0–52.0)
Hemoglobin: 7.7 g/dL — ABNORMAL LOW (ref 13.0–17.0)
MCH: 26.5 pg (ref 26.0–34.0)
MCHC: 31.8 g/dL (ref 30.0–36.0)
MCV: 83.2 fL (ref 78.0–100.0)
Platelets: 250 10*3/uL (ref 150–400)
RBC: 2.91 MIL/uL — ABNORMAL LOW (ref 4.22–5.81)
RDW: 14.5 % (ref 11.5–15.5)
WBC: 10.5 10*3/uL (ref 4.0–10.5)

## 2017-07-08 LAB — TYPE AND SCREEN
ABO/RH(D): A POS
Antibody Screen: NEGATIVE
Unit division: 0
Unit division: 0
Unit division: 0
Unit division: 0

## 2017-07-08 LAB — BPAM RBC
Blood Product Expiration Date: 201811112359
Blood Product Expiration Date: 201811122359
Blood Product Expiration Date: 201811122359
Blood Product Expiration Date: 201811122359
ISSUE DATE / TIME: 201810310756
ISSUE DATE / TIME: 201811011735
Unit Type and Rh: 6200
Unit Type and Rh: 6200
Unit Type and Rh: 6200
Unit Type and Rh: 6200

## 2017-07-08 LAB — BASIC METABOLIC PANEL
ANION GAP: 8 (ref 5–15)
BUN: 27 mg/dL — ABNORMAL HIGH (ref 6–20)
CHLORIDE: 101 mmol/L (ref 101–111)
CO2: 26 mmol/L (ref 22–32)
CREATININE: 1.32 mg/dL — AB (ref 0.61–1.24)
Calcium: 8.2 mg/dL — ABNORMAL LOW (ref 8.9–10.3)
GFR calc non Af Amer: 51 mL/min — ABNORMAL LOW (ref >60)
GFR, EST AFRICAN AMERICAN: 59 mL/min — AB (ref >60)
Glucose, Bld: 97 mg/dL (ref 65–99)
Potassium: 3.5 mmol/L (ref 3.5–5.1)
SODIUM: 135 mmol/L (ref 135–145)

## 2017-07-08 MED ORDER — LEVALBUTEROL HCL 0.63 MG/3ML IN NEBU: 0.6300 mg | INHALATION_SOLUTION | Freq: Four times a day (QID) | RESPIRATORY_TRACT | Status: DC | PRN

## 2017-07-08 MED ORDER — SIMVASTATIN 20 MG PO TABS
20.0000 mg | ORAL_TABLET | Freq: Every day | ORAL | Status: DC
  Administered 2017-07-08 – 2017-07-09 (×2): 20 mg via ORAL
  Filled 2017-07-08 (×2): qty 1

## 2017-07-08 MED ORDER — FUROSEMIDE 40 MG PO TABS
40.0000 mg | ORAL_TABLET | Freq: Every day | ORAL | Status: DC
  Administered 2017-07-08 – 2017-07-10 (×3): 40 mg via ORAL
  Filled 2017-07-08 (×3): qty 1

## 2017-07-08 MED ORDER — POTASSIUM CHLORIDE CRYS ER 10 MEQ PO TBCR
40.0000 meq | EXTENDED_RELEASE_TABLET | Freq: Every day | ORAL | Status: DC
  Administered 2017-07-08 – 2017-07-10 (×3): 40 meq via ORAL
  Filled 2017-07-08 (×3): qty 4

## 2017-07-08 MED ORDER — POTASSIUM CHLORIDE CRYS ER 10 MEQ PO TBCR
20.0000 meq | EXTENDED_RELEASE_TABLET | Freq: Once | ORAL | Status: AC
  Administered 2017-07-08: 20 meq via ORAL
  Filled 2017-07-08: qty 2

## 2017-07-08 NOTE — Op Note (Signed)
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  SURGICAL PROCEDURES:  Aortic valve replacement with pericardial tissue valve [employer], model 3300TFX, 23 mm, serial #[id number] and coronary artery bypass grafting x3 with the left internal mammary to the left anterior descending coronary artery, reverse saphenous vein graft to the diagonal coronary artery, reverse saphenous vein graft to the distal right coronary artery with right thigh and calf greater saphenous endo vein harvesting.  SURGEON:  Lanelle Bal, MD.  FIRST ASSISTANT:  Lars Pinks, [place].  BRIEF HISTORY:  The patient is a 76 year old male who in July of 2018 was admitted to [facility] after syncopal episode associated with this new onset of seizures.  The patient was evaluated for seizures including CT of the brain and MRI.  He was ultimately discharged home on Keppra.  In October, he was referred to Dr. Debara Pickett of Cardiology for evaluation of a cardiac murmur.  Echocardiogram showed evidence of preserved LV function and critical aortic stenosis with a velocity across this aortic valve of greater than 5 m/sec consistent with critical aortic stenosis.  Estimated valve area was approximately 0.4 cm2.  The patient underwent cardiac catheterization by Dr. Claiborne Billings, which demonstrated coronary occlusive disease in addition to his critical aortic stenosis with complex 80% lesion in the proximal LAD involving the diagonal.  The circumflex was relatively free  of significant disease.  The right coronary artery had areas of 70% stenosis and 80% distal right coronary artery stenosis.  The patient was then referred to our office as an outpatient.  He has known underlying pulmonary disease, having been a long-term smoker.  With the patient's symptoms of syncope and critical aortic stenosis and coronary occlusive disease, coronary artery bypass grafting and aortic valve replacement were recommended to the patient who agreed and signed informed consent.  Risks and options were discussed with he and his family in detail and he agreed and signed informed consent.  With his age of 59 years, he was agreeable to proceeding with a tissue valve.  DESCRIPTION OF THE PROCEDURE:  Two days after the patient was seen in the office, he was scheduled for surgery.  With Swan-Ganz and arterial lines in place, the patient underwent general endotracheal anesthesia without incident.  Dr. Linna Caprice placed a TEE probe which again confirmed the diagnosis of critical aortic stenosis.  The skin of the chest and legs was prepped with Betadine and draped in usual sterile manner. Appropriate time-out was performed.  We then proceeded with endoscopic vein harvesting of the right greater saphenous vein from the thigh and upper calf.  The vein was of good quality and caliber.  Median sternotomy was performed.  Left internal mammary artery was dissected down as a pedicle graft and it was  an excellent quality vessel with good free flow.  Pericardium was opened.  Overall ventricular function appeared preserved.  The patient's ascending aorta was not dilated.  A preop CT scan had also been done to evaluate the aorta.  The patient was systemically heparinized.  Ascending aorta was cannulated.  The right atrium was cannulated.  An aortic root vent cardioplegia needle was introduced into the ascending aorta.  The patient was placed on cardiopulmonary bypass 2.4 L/min/m2.  Sites of  anastomosis were selected and dissected out of the epicardium.  The patient's body temperature was then cooled to 32 degrees.  A right superior pulmonary vein vent was placed.  Retrograde cardioplegia catheter was placed.  Aortic cross- clamp was applied and 700 mL of cold blood potassium cardioplegia was administered antegrade.  Additional cold blood cardioplegia was also administered retrograde with diastolic arrest of the heart.  Myocardial septal temperature was monitored throughout the cross-clamp.  We turned our attention first to the coronary arteries.  The distal right coronary artery was opened and admitted a 1.5-mm probe.  Using a running 7-0 Prolene, distal anastomosis with a segment of reversed saphenous vein graft was performed.  The heart was then elevated and a 1.2-mm diagonal vessel was opened.  Using a running 7-0 Prolene, distal anastomosis was performed with a segment of reversed saphenous vein graft.  Between the mid and distal LAD, the LAD was opened.  A 1.5-mm probe passed proximally and distally.  Using a running 8-0 Prolene, left internal mammary artery was anastomosed to left anterior descending coronary artery.  The fascia was tacked to the epicardium.  Intermittently, retrograde cold blood cardioplegia was administered.  We then turned our attention to the aortic valve.  Transverse aortotomy was performed. This gave good exposure of a highly calcified aortic valve with a very small opening, which functionally was just a slight movement of the left and noncoronary leaflets along the commissure.  It appeared that the valve had originally been a 3-leaflet valve, but the commissures were all severely calcified and fused.  The valve was then excised carefully. There was significant amount of calcific debris along the anulus, which was removed.  Care was taken to remove all loose calcific debris.  After debridement of the annulus, we were able to size the valve for a  23 pericardial tissue valve.  #2 Tycron pledgeted sutures with pledgets on the ventricular surface were then placed, a total of 14.  The valve was then secured in place with Tycron sutures.  A 23-mm pericardial tissue valve sat well without obstruction of the right or left coronary ostium. The aortic root was inspected for any loose calcium.  The aortotomy was then closed with horizontal mattress 4-0 Prolene suture over felt strips.  A second layer of a running 4-0 Prolene was placed.  With the crossclamp still in place, 2 punch aortotomies were performed and each of the two vein grafts were anastomosed to the ascending aorta.  Prior to complete closure of the proximal anastomoses, the heart was allowed to passively fill and de-air.  CO2 had been placed into the pericardial cradle during the procedure.  This was then turned off the bulldog and the mammary artery was removed with prompt rise in myocardial septal temperature.  The aortic cross-clamp was then removed with a total cross- clamp time of 149 minutes.  An 18-gauge needle was introduced into the left ventricular apex to further de-air the heart.  The patient required electric defibrillation and returned to a  sinus rhythm.  He was atrially paced to increase rate while on bypass.  His urine output initially was somewhat low.  So, he had been maintained on 3 mcg/kg of dopamine.  This was continued.  Atrial and ventricular pacing wires were applied.  The right superior pulmonary vein vent was removed.  TEE showed good functioning of the aortic valve without evidence of perivalvular leak. With body temperature rewarmed to 37 degrees, he was then ventilated and weaned from cardiopulmonary bypass without difficulty.  He remained hemodynamically stable.  He was decannulated in usual fashion. Protamine sulfate was administered with operative field hemostatic.  A left pleural tube was placed in a Blake mediastinal drain.  Pericardium was  loosely reapproximated.  Sternum was closed with #6 stainless steel wire.  Fascia was closed with interrupted 3-0 Vicryl, running 3-0 Vicryl in subcutaneous tissue, 4-0 subcuticular stitch in skin edges.  Dry dressings were applied.  Sponge and needle count was reported as correct at the completion of procedure.  Because of low hematocrit prior to surgery, he was given 1 unit of packed red blood cells while on pump. Total pump time was 206 minutes.    Lanelle Bal, MD    EG/MEDQ  D:  07/08/2017  T:  07/08/2017  Job:  [id number]

## 2017-07-08 NOTE — Progress Notes (Addendum)
      [address]       [place] [address]             [phone]        4 Days Post-Op Procedure(s) (LRB): AORTIC VALVE REPLACEMENT (AVR) (N/A) CORONARY ARTERY BYPASS GRAFTING (CABG) x three, using left internal mammary artery and right leg greater saphenous vein harvested endoscopically (N/A) TRANSESOPHAGEAL ECHOCARDIOGRAM (TEE) (N/A)  Subjective: Patient just waking up this am. No specific complaints.  Objective: Vital signs in last 24 hours: Temp:  [97.6 F (36.4 C)-98.8 F (37.1 C)] 98.8 F (37.1 C) (11/04 0521) Pulse Rate:  [66-76] 69 (11/04 0500) Cardiac Rhythm: Heart block (11/03 1900) Resp:  [18-28] 22 (11/04 0500) BP: (96-125)/(52-75) 121/63 (11/03 2356) SpO2:  [91 %-100 %] 98 % (11/04 0500) Weight:  [148 lb 14.4 oz (67.5 kg)] 148 lb 14.4 oz (67.5 kg) (11/04 0521)  Pre op weight 67 kg Current Weight  07/08/17 148 lb 14.4 oz (67.5 kg)      Intake/Output from previous day: 11/03 0701 - 11/04 0700 In: 120 [P.O.:120] Out: 2450 [Urine:2450]   Physical Exam:  Cardiovascular: RRR Pulmonary: Slightly diminished at bases. Abdomen: Soft, non tender, bowel sounds present. Extremities:Trace bilateral lower extremity edema. Wounds: Clean and dry.  No erythema or signs of infection.  Lab Results: CBC: Recent Labs    07/07/17 0342 07/08/17 0144  WBC 11.2* 10.5  HGB 7.2* 7.7*  HCT 22.9* 24.2*  PLT 173 250   BMET:  Recent Labs    07/07/17 0342 07/08/17 0144  NA 136 135  K 4.0 3.5  CL 104 101  CO2 25 26  GLUCOSE 98 97  BUN 32* 27*  CREATININE 1.57* 1.32*  CALCIUM 7.9* 8.2*    PT/INR:  Lab Results  Component Value Date   INR 1.45 07/04/2017   INR 0.97 07/03/2017   INR 1.0 06/18/2017   ABG:  INR: Will add last result for INR, ABG once components are confirmed Will add last 4 CBG results once components are confirmed  Assessment/Plan:  1. CV - First degree heart block, SR. On Lopressor 12.5 mg bid.  2.  Pulmonary - On  2 liters of oxygen via . Wean to room air as tolerates. CXR this am appears stable.Encourage incentive spirometer. 3. Chronic diastolic CHF-on Lasix and has been diuresing well. 4.  Acute blood loss anemia - H and H increased to 7.7 and 24.2.Continue Niferex. 5. Supplement potassium 6. Creatinine decreased from 1.57 to 1.32 7. Remove EPW 8. He is not on a statin and no allergy so will start 9. Possible discharge 1-2 days  ZIMMERMAN,DONIELLE MPA-C 07/08/2017,7:08 AM  I have seen and examined the patient and agree with the assessment and plan as outlined.  Rexene Alberts, MD 07/08/2017 9:27 AM

## 2017-07-08 NOTE — Discharge Instructions (Signed)
Aortic Valve Replacement, Care After °Refer to this sheet in the next few weeks. These instructions provide you with information about caring for yourself after your procedure. Your health care provider may also give you more specific instructions. Your treatment has been planned according to current medical practices, but problems sometimes occur. Call your health care provider if you have any problems or questions after your procedure. °What can I expect after the procedure? °After the procedure, it is common to have: °· Pain around your incision area. °· A small amount of blood or clear fluid coming from your incision. ° °Follow these instructions at home: °Eating and drinking ° °· Follow instructions from your health care provider about eating or drinking restrictions. °? Limit alcohol intake to no more than 1 drink per day for nonpregnant women and 2 drinks per day for men. One drink equals 12 oz of beer, 5 oz of wine, or 1½ oz of hard liquor. °? Limit how much caffeine you drink. Caffeine can affect your heart's rate and rhythm. °· Drink enough fluid to keep your urine clear or pale yellow. °· Eat a heart-healthy diet. This should include plenty of fresh fruits and vegetables. If you eat meat, it should be lean cuts. Avoid foods that are: °? High in salt, saturated fat, or sugar. °? Canned or highly processed. °? Fried. °Activity °· Return to your normal activities as told by your health care provider. Ask your health care provider what activities are safe for you. °· Exercise regularly once you have recovered, as told by your health care provider. °· Avoid sitting for more than 2 hours at a time without moving. Get up and move around at least once every 1-2 hours. This helps to prevent blood clots in the legs. °· Do not lift anything that is heavier than 10 lb (4.5 kg) until your health care provider approves. °· Avoid pushing or pulling things with your arms until your health care provider approves. This  includes pulling on handrails to help you climb stairs. °Incision care ° °· Follow instructions from your health care provider about how to take care of your incision. Make sure you: °? Wash your hands with soap and water before you change your bandage (dressing). If soap and water are not available, use hand sanitizer. °? Change your dressing as told by your health care provider. °? Leave stitches (sutures), skin glue, or adhesive strips in place. These skin closures may need to stay in place for 2 weeks or longer. If adhesive strip edges start to loosen and curl up, you may trim the loose edges. Do not remove adhesive strips completely unless your health care provider tells you to do that. °· Check your incision area every day for signs of infection. Check for: °? More redness, swelling, or pain. °? More fluid or blood. °? Warmth. °? Pus or a bad smell. °Medicines °· Take over-the-counter and prescription medicines only as told by your health care provider. °· If you were prescribed an antibiotic medicine, take it as told by your health care provider. Do not stop taking the antibiotic even if you start to feel better. °Travel °· Avoid airplane travel for as long as told by your health care provider. °· When you travel, bring a list of your medicines and a record of your medical history with you. Carry your medicines with you. °Driving °· Ask your health care provider when it is safe for you to drive. Do not drive until your health   care provider approves.  Do not drive or operate heavy machinery while taking prescription pain medicine. Lifestyle   Do not use any tobacco products, such as cigarettes, chewing tobacco, or e-cigarettes. If you need help quitting, ask your health care provider.  Resume sexual activity as told by your health care provider. Do not use medicines for erectile dysfunction unless your health care provider approves, if this applies.  Work with your health care provider to keep your  blood pressure and cholesterol under control, and to manage any other heart conditions that you have.  Maintain a healthy weight. General instructions  Do not take baths, swim, or use a hot tub until your health care provider approves.  Do not strain to have a bowel movement.  Avoid crossing your legs while sitting down.  Check your temperature every day for a fever. A fever may be a sign of infection.  If you are a woman and you plan to become pregnant, talk with your health care provider before you become pregnant.  Wear compression stockings if your health care provider instructs you to do this. These stockings help to prevent blood clots and reduce swelling in your legs.  Tell all health care providers who care for you that you have an artificial (prosthetic) aortic valve. If you have or have had heart disease or endocarditis, tell all health care providers about these conditions as well.  Keep all follow-up visits as told by your health care provider. This is important. Contact a health care provider if:  You develop a skin rash.  You experience sudden, unexplained changes in your weight.  You have more redness, swelling, or pain around your incision.  You have more fluid or blood coming from your incision.  Your incision feels warm to the touch.  You have pus or a bad smell coming from your incision.  You have a fever. Get help right away if:  You develop chest pain that is different from the pain coming from your incision.  You develop shortness of breath or difficulty breathing.  You start to feel light-headed. These symptoms may represent a serious problem that is an emergency. Do not wait to see if the symptoms will go away. Get medical help right away. Call your local emergency services (911 in the [place] Do not drive yourself to the hospital. This information is not intended to replace advice given to you by your health care provider. Make sure you discuss any  questions you have with your health care provider. Document Released: 03/09/2005 Document Revised: 01/27/2016 Document Reviewed: 07/25/2015 Elsevier Interactive Patient Education  2017 [employer] Inc.  Coronary Artery Bypass Grafting, Care After This sheet gives you information about how to care for yourself after your procedure. Your health care provider may also give you more specific instructions. If you have problems or questions, contact your health care provider. What can I expect after the procedure? After the procedure, it is common to have:  Nausea and a lack of appetite.  Constipation.  Weakness and fatigue.  Depression or irritability.  Pain or discomfort in your incision areas.  Follow these instructions at home: Medicines  Take over-the-counter and prescription medicines only as told by your health care provider. Do not stop taking medicines or start any new medicines without approval from your health care provider.  If you were prescribed an antibiotic medicine, take it as told by your health care provider. Do not stop taking the antibiotic even if you start to feel better.  Do not drive or use heavy machinery while taking prescription pain medicine. Incision care  Follow instructions from your health care provider about how to take care of your incisions. Make sure you: ? Wash your hands with soap and water before you change your bandage (dressing). If soap and water are not available, use hand sanitizer. ? Change your dressing as told by your health care provider. ? Leave stitches (sutures), skin glue, or adhesive strips in place. These skin closures may need to stay in place for 2 weeks or longer. If adhesive strip edges start to loosen and curl up, you may trim the loose edges. Do not remove adhesive strips completely unless your health care provider tells you to do that.  Keep incision areas clean, dry, and protected.  Check your incision areas every day for  signs of infection. Check for: ? More redness, swelling, or pain. ? More fluid or blood. ? Warmth. ? Pus or a bad smell.  If incisions were made in your legs: ? Avoid crossing your legs. ? Avoid sitting for long periods of time. Change positions every 30 minutes. ? Raise (elevate) your legs when you are sitting. Bathing  Do not take baths, swim, or use a hot tub until your health care provider approves.  Only take sponge baths. Pat the incisions dry. Do not rub incisions with a washcloth or towel.  Ask your health care provider when you can shower. Eating and drinking  Eat foods that are high in fiber, such as raw fruits and vegetables, whole grains, beans, and nuts. Meats should be lean cut. Avoid canned, processed, and fried foods. This can help prevent constipation and is a recommended part of a heart-healthy diet.  Drink enough fluid to keep your urine clear or pale yellow.  Limit alcohol intake to no more than 1 drink a day for nonpregnant women and 2 drinks a day for men. One drink equals 12 oz of beer, 5 oz of wine, or 1 oz of hard liquor. Activity  Rest and limit your activity as told by your health care provider. You may be instructed to: ? Stop any activity right away if you have chest pain, shortness of breath, irregular heartbeats, or dizziness. Get help right away if you have any of these symptoms. ? Move around frequently for short periods or take short walks as directed by your health care provider. Gradually increase your activities. You may need physical therapy or cardiac rehabilitation to help strengthen your muscles and build your endurance. ? Avoid lifting, pushing, or pulling anything that is heavier than 10 lb (4.5 kg) for at least 6 weeks or as told by your health care provider.  Do not drive until your health care provider approves.  Ask your health care provider when you may return to work.  Ask your health care provider when you may resume sexual  activity. General instructions  Do not use any products that contain nicotine or tobacco, such as cigarettes and e-cigarettes. If you need help quitting, ask your health care provider.  Take 2-3 deep breaths every few hours during the day, while you recover. This helps expand your lungs and prevent complications like pneumonia after surgery.  If you were given a device called an incentive spirometer, use it several times a day to practice deep breathing. Support your chest with a pillow or your arms when you take deep breaths or cough.  Wear compression stockings as told by your health care provider. These stockings help  to prevent blood clots and reduce swelling in your legs.  Weigh yourself every day. This helps identify if your body is holding (retaining) fluid that may make your heart and lungs work harder.  Keep all follow-up visits as told by your health care provider. This is important. Contact a health care provider if:  You have more redness, swelling, or pain around any incision.  You have more fluid or blood coming from any incision.  Any incision feels warm to the touch.  You have pus or a bad smell coming from any incision  You have a fever.  You have swelling in your ankles or legs.  You have pain in your legs.  You gain 2 lb (0.9 kg) or more a day.  You are nauseous or you vomit.  You have diarrhea. Get help right away if:  You have chest pain that spreads to your jaw or arms.  You are short of breath.  You have a fast or irregular heartbeat.  You notice a "clicking" in your breastbone (sternum) when you move.  You have numbness or weakness in your arms or legs.  You feel dizzy or light-headed. Summary  After the procedure, it is common to have pain or discomfort in the incision areas.  Do not take baths, swim, or use a hot tub until your health care provider approves.  Gradually increase your activities. You may need physical therapy or cardiac  rehabilitation to help strengthen your muscles and build your endurance.  Weigh yourself every day. This helps identify if your body is holding (retaining) fluid that may make your heart and lungs work harder. This information is not intended to replace advice given to you by your health care provider. Make sure you discuss any questions you have with your health care provider. Document Released: 03/10/2005 Document Revised: 07/10/2016 Document Reviewed: 07/10/2016 Elsevier Interactive Patient Education  2018 [employer].  Preventing Type 2 Diabetes Mellitus Type 2 diabetes (type 2 diabetes mellitus) is a long-term (chronic) disease that affects blood sugar (glucose) levels. Normally, a hormone called insulin allows glucose to enter cells in the body. The cells use glucose for energy. In type 2 diabetes, one or both of these problems may be present:  The body does not make enough insulin.  The body does not respond properly to insulin that it makes (insulin resistance).  Insulin resistance or lack of insulin causes excess glucose to build up in the blood instead of going into cells. As a result, high blood glucose (hyperglycemia) develops, which can cause many complications. Being overweight or obese and having an inactive (sedentary) lifestyle can increase your risk for diabetes. Type 2 diabetes can be delayed or prevented by making certain nutrition and lifestyle changes. What nutrition changes can be made?  Eat healthy meals and snacks regularly. Keep a healthy snack with you for when you get hungry between meals, such as fruit or a handful of nuts.  Eat lean meats and proteins that are low in saturated fats, such as chicken, fish, egg whites, and beans. Avoid processed meats.  Eat plenty of fruits and vegetables and plenty of grains that have not been processed (whole grains). It is recommended that you eat: ? 1?2 cups of fruit every day. ? 2?3 cups of vegetables every day. ? 6?8 oz  of whole grains every day, such as oats, whole wheat, bulgur, brown rice, quinoa, and millet.  Eat low-fat dairy products, such as milk, yogurt, and cheese.  Eat foods that  contain healthy fats, such as nuts, avocado, olive oil, and canola oil.  Drink water throughout the day. Avoid drinks that contain added sugar, such as soda or sweet tea.  Follow instructions from your health care provider about specific eating or drinking restrictions.  Control how much food you eat at a time (portion size). ? Check food labels to find out the serving sizes of foods. ? Use a kitchen scale to weigh amounts of foods.  Saute or steam food instead of frying it. Cook with water or broth instead of oils or butter.  Limit your intake of: ? Salt (sodium). Have no more than 1 tsp (2,400 mg) of sodium a day. If you have heart disease or high blood pressure, have less than ? tsp (1,500 mg) of sodium a day. ? Saturated fat. This is fat that is solid at room temperature, such as butter or fat on meat. What lifestyle changes can be made?  Activity  Do moderate-intensity physical activity for at least 30 minutes on at least 5 days of the week, or as much as told by your health care provider.  Ask your health care provider what activities are safe for you. A mix of physical activities may be best, such as walking, swimming, cycling, and strength training.  Try to add physical activity into your day. For example: ? Park in spots that are farther away than usual, so that you walk more. For example, park in a far corner of the parking lot when you go to the office or the grocery store. ? Take a walk during your lunch break. ? Use stairs instead of elevators or escalators. Weight Loss  Lose weight as directed. Your health care provider can determine how much weight loss is best for you and can help you lose weight safely.  If you are overweight or obese, you may be instructed to lose at least 5?7 % of your body  weight. Alcohol and Tobacco   Limit alcohol intake to no more than 1 drink a day for nonpregnant women and 2 drinks a day for men. One drink equals 12 oz of beer, 5 oz of wine, or 1 oz of hard liquor.  Do not use any tobacco products, such as cigarettes, chewing tobacco, and e-cigarettes. If you need help quitting, ask your health care provider. Work With [place] Provider  Have your blood glucose tested regularly, as told by your health care provider.  Discuss your risk factors and how you can reduce your risk for diabetes.  Get screening tests as told by your health care provider. You may have screening tests regularly, especially if you have certain risk factors for type 2 diabetes.  Make an appointment with a diet and nutrition specialist (registered dietitian). A registered dietitian can help you make a healthy eating plan and can help you understand portion sizes and food labels. Why are these changes important?  It is possible to prevent or delay type 2 diabetes and related health problems by making lifestyle and nutrition changes.  It can be difficult to recognize signs of type 2 diabetes. The best way to avoid possible damage to your body is to take actions to prevent the disease before you develop symptoms. What can happen if changes are not made?  Your blood glucose levels may keep increasing. Having high blood glucose for a long time is dangerous. Too much glucose in your blood can damage your blood vessels, heart, kidneys, nerves, and eyes.  You may  develop prediabetes or type 2 diabetes. Type 2 diabetes can lead to many chronic health problems and complications, such as: ? Heart disease. ? Stroke. ? Blindness. ? Kidney disease. ? Depression. ? Poor circulation in the feet and legs, which could lead to surgical removal (amputation) in severe cases. Where to find support:  Ask your health care provider to recommend a registered dietitian, diabetes educator, or  weight loss program.  Look for local or online weight loss groups.  Join a gym, fitness club, or outdoor activity group, such as a walking club. Where to find more information: To learn more about diabetes and diabetes prevention, visit:  American Diabetes Association (ADA): www.diabetes.[employer] of Diabetes and Digestive and Kidney Diseases: FindSpin.nl  To learn more about healthy eating, visit:  The [place] Department of Agriculture Scientist, research (physical sciences)), Choose My Plate: http://wiley-williams.com/  Office of Disease Prevention and Health Promotion (ODPHP), Dietary Guidelines: SurferLive.at  Summary  You can reduce your risk for type 2 diabetes by increasing your physical activity, eating healthy foods, and losing weight as directed.  Talk with your health care provider about your risk for type 2 diabetes. Ask about any blood tests or screening tests that you need to have. This information is not intended to replace advice given to you by your health care provider. Make sure you discuss any questions you have with your health care provider. Document Released: 12/13/2015 Document Revised: 01/27/2016 Document Reviewed: 10/12/2015 Elsevier Interactive Patient Education  [employer].

## 2017-07-09 NOTE — Progress Notes (Addendum)
      [address]       [place] [address]             [phone]      5 Days Post-Op Procedure(s) (LRB): AORTIC VALVE REPLACEMENT (AVR) (N/A) CORONARY ARTERY BYPASS GRAFTING (CABG) x three, using left internal mammary artery and right leg greater saphenous vein harvested endoscopically (N/A) TRANSESOPHAGEAL ECHOCARDIOGRAM (TEE) (N/A)   Subjective:  Danny Krause has no specific complaints.  She did not get walked yesterday on this unit after transfer.  + BM    Objective: Vital signs in last 24 hours: Temp:  [96.9 F (36.1 C)-98.7 F (37.1 C)] 98.7 F (37.1 C) (11/05 0519) Pulse Rate:  [63-84] 69 (11/05 0400) Cardiac Rhythm: Heart block (11/05 0515) Resp:  [14-25] 25 (11/05 0400) BP: (103-129)/(58-71) 114/69 (11/05 0400) SpO2:  [82 %-98 %] 94 % (11/05 0400) Weight:  [147 lb (66.7 kg)] 147 lb (66.7 kg) (11/05 0519)  Intake/Output from previous day: 11/04 0701 - 11/05 0700 In: 590 [P.O.:590] Out: 725 [Urine:725]  General appearance: alert, cooperative and no distress Heart: regular rate and rhythm Lungs: diminished breath sounds bibasilar Abdomen: soft, non-tender; bowel sounds normal; no masses,  no organomegaly Extremities: edema trace Wound: clean and dry  Lab Results: Recent Labs    07/07/17 0342 07/08/17 0144  WBC 11.2* 10.5  HGB 7.2* 7.7*  HCT 22.9* 24.2*  PLT 173 250   BMET:  Recent Labs    07/07/17 0342 07/08/17 0144  NA 136 135  K 4.0 3.5  CL 104 101  CO2 25 26  GLUCOSE 98 97  BUN 32* 27*  CREATININE 1.57* 1.32*  CALCIUM 7.9* 8.2*    PT/INR: No results for input(s): LABPROT, INR in the last 72 hours. ABG    Component Value Date/Time   PHART 7.350 07/05/2017 0050   HCO3 22.4 07/05/2017 0050   TCO2 24 07/05/2017 1651   ACIDBASEDEF 3.0 (H) 07/05/2017 0050   O2SAT 90.0 07/05/2017 0050   CBG (last 3)  Recent Labs    07/07/17 0346 07/07/17 0756 07/07/17 1148  GLUCAP 96 93 103*    Assessment/Plan: S/P Procedure(s)  (LRB): AORTIC VALVE REPLACEMENT (AVR) (N/A) CORONARY ARTERY BYPASS GRAFTING (CABG) x three, using left internal mammary artery and right leg greater saphenous vein harvested endoscopically (N/A) TRANSESOPHAGEAL ECHOCARDIOGRAM (TEE) (N/A)  1. CV- Sinus Brady, BP controlled- continue Lopressor 2. Pulm- dyspnea with exertion, weaning oxygen as tolerated, CXR yesterday showed increase in interstitial edema 3. Renal- creatinine trending down, weight is stable continue Lasix 4. Expected post operative blood loss anemia- moderate, Hgb at 7.7 on iron supplementation 5. Dispo- patient stable, needs to ambulate, wean oxygen as tolerated, continue IS, d/c EPW... If stable possibly ready for d/c in AM   LOS: 5 days    Danny Krause, Danny Krause 07/09/2017  Did not ambulate yesterday at all per patient.  I have seen and examined Danny Krause and agree with the above assessment  and plan.  Grace Isaac MD [facility] [phone] Office [phone] 07/09/2017 2:27 PM

## 2017-07-09 NOTE — Plan of Care (Signed)
Patient ambulating with minimal assistance, still requiring oxygen

## 2017-07-09 NOTE — Care Management Important Message (Signed)
Important Message  Patient Details  Name: Danny Krause MRN: [id number] Date of Birth: 12-04-1940   Medicare Important Message Given:  Yes    Nathen May 07/09/2017, 10:33 AM

## 2017-07-09 NOTE — Progress Notes (Signed)
Pt weaned off O2 today, sats 88-92 at room air. Ambulated in hall with walker without O2, sats lowest at 77. Coughing and deep breathing on 2L [place] back in room sats up to 94. Weaned back down to 0.5L [place] sats at 89-93.

## 2017-07-09 NOTE — Progress Notes (Signed)
CARDIAC REHAB PHASE I   PRE:  Rate/Rhythm: 73 first deg    BP: sitting 123/67    SaO2: 90-93 RA in bed ([place] not in nose)  MODE:  Ambulation: 620 ft   POST:  Rate/Rhythm: 86 first deg    BP: sitting 117/82     SaO2: 92 2L  Pt eager to get out of bed and ambulate. Sts he did not get out of bed at all yesterday. He just finished breakfast and was on RA, SaO2 90-93 RA. However with 50 ft of walking, SAO2 down to 87 RA. I applied 2L O2 and he finished walking long distance maintaining 92 2L. Slow pace with RW, no c/o. To recliner. Left on 2L O2 (tried RA in recliner but down to 88 RA). Encouraged x2 more walks, IS, flutter, recliner. Pt has RW at home. [place], ACSM 07/09/2017 9:17 AM

## 2017-07-09 NOTE — Progress Notes (Signed)
Patient required increase in O2 to maintain oxygen saturations.  Oxygen came out of nose, sats down to 82%.  Patient encouraged to cough and deep breathe.  Oxygen currently at 3L per [place]

## 2017-07-10 MED ORDER — METOPROLOL TARTRATE 25 MG PO TABS: 12.5000 mg | ORAL_TABLET | Freq: Two times a day (BID) | ORAL | 1 refills | Status: DC

## 2017-07-10 MED ORDER — SIMVASTATIN 20 MG PO TABS: 20.0000 mg | ORAL_TABLET | Freq: Every day | ORAL | 1 refills | Status: DC

## 2017-07-10 MED ORDER — OXYCODONE HCL 5 MG PO TABS: ORAL_TABLET | ORAL | 0 refills | Status: DC

## 2017-07-10 MED ORDER — ASPIRIN 325 MG PO TBEC: 325.0000 mg | DELAYED_RELEASE_TABLET | Freq: Every day | ORAL | 0 refills | Status: DC

## 2017-07-10 NOTE — Care Management Note (Addendum)
Case Management Note Marvetta Gibbons RN, BSN Unit 4E-Case Manager-- [place] coverage [phone]  Patient Details  Name: Danny Krause MRN: [id number] Date of Birth: 1941-05-08  Subjective/Objective:   Pt admitted s/p AVR, CABGx3 on 10/31                 Action/Plan: PTA pt lived at home with wife- independent-  CM to follow for d/c needs  Expected Discharge Date:  07/10/17               Expected Discharge Plan:  Home/Self Care  In-House Referral:  NA  Discharge planning Services  CM Consult  Post Acute Care Choice:  Durable Medical Equipment Choice offered to:  Patient  DME Arranged:  Oxygen DME Agency:  [place]:  NA Trail Agency:  NA  Status of Service:  Completed, signed off  If discussed at [place] of Stay Meetings, dates discussed:    Discharge Disposition: home/self care   Additional Comments:  07/10/17- 1030- [name] RN, CM- pt for d/c home today- will need home 02- order has been placed- cardiac rehab has completed qualifying note for insurance needs- spoke with pt at bedside- pt states that his wife uses home 02 through [facility]  - and that he would like to use [facility] also for his 02 needs- informed pt that [facility] would deliver a portable tank to his room prior to discharge for his needs- and go over with him arrangements for his home 02. Referral called to [facility] with [facility] for home 02 needs- pt has RW at home- no further d/c needs noted. Pt to return home with family.  Update 1600- discussed with pt's daughter and son supervision needed at home- pt ambulated 40 ft with cardiac rehab no HH needs for discharge. Explained to family that if they need further assistance at home it would need to be arranged by them and out of pocket expense. Son states he lives right next door to pt and can be there to frequently check in on pt.   07/06/17- 1500- Marvetta Gibbons RN, CM- called by unit RN that family requesting to speak with CM regarding potential d/c  needs- went down to pt's room and spoke with pt and his family regarding possible d/c needs- pt is caregiver of his wife at home- has supportive family to assist- able to get additional help if needed- pt has RW at home already- discussed that we will see how pt progresses post op and determine d/c needs closer to discharge- and follow for any DME needs such as home 02. CM provided family with contact # if any further questions arise- CM will continue to follow.   Dawayne Patricia, RN 07/10/2017, 11:55 AM

## 2017-07-10 NOTE — Progress Notes (Addendum)
      [address]       ,[place] [address]             [phone]        6 Days Post-Op Procedure(s) (LRB): AORTIC VALVE REPLACEMENT (AVR) (N/A) CORONARY ARTERY BYPASS GRAFTING (CABG) x three, using left internal mammary artery and right leg greater saphenous vein harvested endoscopically (N/A) TRANSESOPHAGEAL ECHOCARDIOGRAM (TEE) (N/A)  Subjective: Patient just waking up this am. He has not been sleeping well. He is worried about his wife as she was just admitted to hospital.  Objective: Vital signs in last 24 hours: Temp:  [98.4 F (36.9 C)-98.9 F (37.2 C)] 98.9 F (37.2 C) (11/06 0400) Pulse Rate:  [68-76] 74 (11/06 0400) Cardiac Rhythm: Normal sinus rhythm (11/06 0352) Resp:  [16-25] 25 (11/06 0400) BP: (107-137)/(58-82) 137/75 (11/06 0400) SpO2:  [89 %-98 %] 90 % (11/06 0400) Weight:  [144 lb 8 oz (65.5 kg)] 144 lb 8 oz (65.5 kg) (11/06 0400)  Pre op weight 67 kg Current Weight  07/10/17 144 lb 8 oz (65.5 kg)      Intake/Output from previous day: 11/05 0701 - 11/06 0700 In: -  Out: 1360 [Urine:1360]   Physical Exam:  Cardiovascular: RRR Pulmonary: Slightly diminished at bases. Abdomen: Soft, non tender, bowel sounds present. Extremities:Trace bilateral lower extremity edema. Wounds: Clean and dry.  No erythema or signs of infection.  Lab Results: CBC: Recent Labs    07/08/17 0144  WBC 10.5  HGB 7.7*  HCT 24.2*  PLT 250   BMET:  Recent Labs    07/08/17 0144  NA 135  K 3.5  CL 101  CO2 26  GLUCOSE 97  BUN 27*  CREATININE 1.32*  CALCIUM 8.2*    PT/INR:  Lab Results  Component Value Date   INR 1.45 07/04/2017   INR 0.97 07/03/2017   INR 1.0 06/18/2017   ABG:  INR: Will add last result for INR, ABG once components are confirmed Will add last 4 CBG results once components are confirmed  Assessment/Plan:  1. CV - First degree heart block, SR. On Lopressor 12.5 mg bid.  2.  Pulmonary - On 1 liter of oxygen via  [place]. He does desaturate on room air with ambulation. Will arrange for home oxygen. Encourage incentive spirometer. 3. Chronic diastolic CHF-on Lasix and has been diuresing well. He is below pre op weight and no LE edema. As discussed with Dr. Servando Snare, he will not need further diuresis after discharge. 4.  Acute blood loss anemia - Last H and H increased to 7.7 and 24.2.Continue Niferex. 5. As discussed with Dr. Servando Snare, ok to discharge with oxygen. Patient states his wife is now in hospital and he would like to see her.  ZIMMERMAN,DONIELLE MPA-C 07/10/2017,7:17 AM

## 2017-07-10 NOTE — Progress Notes (Signed)
88SATURATION QUALIFICATIONS: (This note is used to comply with regulatory documentation for home oxygen)  Patient Saturations on Room Air at Rest = 88%  Patient Saturations on Room Air while Ambulating = 86%  Patient Saturations on 2 Liters of oxygen while Ambulating = 90%  Please briefly explain why patient needs home oxygen: needs oxygen to keep sats up

## 2017-07-10 NOTE — Progress Notes (Signed)
CARDIAC REHAB PHASE I   PRE:  Rate/Rhythm: 84 SR  BP:  Supine: 118/62  Sitting:   Standing:    SaO2: 88%RA  MODE:  Ambulation: 450 ft   POST:  Rate/Rhythm: 88 SR  BP:  Supine:   Sitting: 110/58  Standing:    SaO2: 88%RA  90% 2L 1000-1055 Pt walked 450 ft on 2L with rolling walker, stopping to rest once. Pt stated he has walker at home. Wrote qualifying note for oxygen as pt desat on RA quickly and required 2L to keep sats up. Discussed sternal precautions, IS, flutter valve, heart healthy food choices, tobacco cessation, ex ed and CRP 2. Gave pt fake cigarette and smoking cessation handout. [name] is interested in pt getting prescription for nicotine patches. Pt quit once long ago cold [place] for 3 years but may need some help this time to quit. Did not want to view discharge video. Discussed CRP 2 and will refer to [place]. Case manager aware of oxygen need.   Graylon Good, RN BSN  07/10/2017 10:52 AM

## 2017-07-11 ENCOUNTER — Telehealth (HOSPITAL_COMMUNITY): Payer: Self-pay

## 2017-07-11 NOTE — Telephone Encounter (Signed)
Patients insurance is active and benefits verified through Medicare Part A & B - No co-pay, deductible amount of $183.00/$183.00 has been met, no out of pocket, 20% co-insurance, and no pre-authorization is required. Passport/reference [phone]  Patient will be contacted and scheduled after their follow up appt with the Cardiologist office upon review by the RN Navigator

## 2017-07-19 ENCOUNTER — Ambulatory Visit (INDEPENDENT_AMBULATORY_CARE_PROVIDER_SITE_OTHER): Payer: Medicare Other | Admitting: Physician Assistant

## 2017-07-19 ENCOUNTER — Encounter: Payer: Self-pay | Admitting: Physician Assistant

## 2017-07-19 VITALS — BP 124/68 | HR 73 | Ht 67.0 in | Wt 150.0 lb

## 2017-07-19 DIAGNOSIS — E782 Mixed hyperlipidemia: Secondary | ICD-10-CM

## 2017-07-19 DIAGNOSIS — Z79899 Other long term (current) drug therapy: Secondary | ICD-10-CM | POA: Diagnosis not present

## 2017-07-19 DIAGNOSIS — Z952 Presence of prosthetic heart valve: Secondary | ICD-10-CM | POA: Diagnosis not present

## 2017-07-19 DIAGNOSIS — I2510 Atherosclerotic heart disease of native coronary artery without angina pectoris: Secondary | ICD-10-CM

## 2017-07-19 DIAGNOSIS — Z951 Presence of aortocoronary bypass graft: Secondary | ICD-10-CM | POA: Diagnosis not present

## 2017-07-19 DIAGNOSIS — D649 Anemia, unspecified: Secondary | ICD-10-CM | POA: Diagnosis not present

## 2017-07-19 DIAGNOSIS — N179 Acute kidney failure, unspecified: Secondary | ICD-10-CM

## 2017-07-19 DIAGNOSIS — E785 Hyperlipidemia, unspecified: Secondary | ICD-10-CM

## 2017-07-19 LAB — BASIC METABOLIC PANEL
BUN/Creatinine Ratio: 19 (ref 10–24)
BUN: 18 mg/dL (ref 8–27)
CALCIUM: 9.5 mg/dL (ref 8.6–10.2)
CO2: 24 mmol/L (ref 20–29)
Chloride: 98 mmol/L (ref 96–106)
Creatinine, Ser: 0.97 mg/dL (ref 0.76–1.27)
GFR, EST AFRICAN AMERICAN: 87 mL/min/{1.73_m2} (ref >59)
GFR, EST NON AFRICAN AMERICAN: 76 mL/min/{1.73_m2} (ref >59)
Glucose: 93 mg/dL (ref 65–99)
POTASSIUM: 5.2 mmol/L (ref 3.5–5.2)
Sodium: 139 mmol/L (ref 134–144)

## 2017-07-19 LAB — CBC
HEMATOCRIT: 31.2 % — AB (ref 37.5–51.0)
Hemoglobin: 9.6 g/dL — ABNORMAL LOW (ref 13.0–17.7)
MCH: 25.5 pg — AB (ref 26.6–33.0)
MCHC: 30.8 g/dL — ABNORMAL LOW (ref 31.5–35.7)
MCV: 83 fL (ref 79–97)
Platelets: 556 10*3/uL — ABNORMAL HIGH (ref 150–379)
RBC: 3.76 x10E6/uL — ABNORMAL LOW (ref 4.14–5.80)
RDW: 15.3 % (ref 12.3–15.4)
WBC: 12.4 10*3/uL — AB (ref 3.4–10.8)

## 2017-07-19 NOTE — Progress Notes (Signed)
Post op anemia improved as expected, still mildly anemic. Kidney function normalized.

## 2017-07-19 NOTE — Progress Notes (Signed)
Cardiology Office Note    Date:  07/21/2017   ID:  Danny Krause, DOB 12-Oct-1940, MRN [id number]  PCP:  Jani Gravel, MD  Cardiologist:  Dr. Debara Pickett   No chief complaint on file.   History of Present Illness:  Danny Krause is a 76 y.o. male with PMH of critical AS and CAD. Patient was referred to Dr. Debara Pickett for evaluation of heart murmur. In July 2018, he had a syncopal episode and a seizure episode as well. He was placed on Keppra. He was found to have critical aortic stenosis with calculated aortic valve area 0.4 centimeters square, mean gradient across aortic valve was 87 mmHg. EF 55-60%. As part of his preoperative workup, he underwent cardiac catheterization on 06/25/2017, this showed 70% proximal LAD, 50% proximal to mid left circumflex, 80% proximal to mid RCA. Critical aortic valve stenosis withpeak gradient while 4, mean gradients 80 mmHg, aortic valve area 0.37 cm. Preoperative Doppler showed mild carotid disease in the left ICA, no stenosis in right RCA. Normal ABI bilaterally.  Patient eventually underwent CABG 3 with LIMA to LAD, SVG to diagonal, SVG to PDA along with aortic valve replacement using bioprosthetic valve. His hemoglobin A1c was 5.9. Postop, he had the usual postopve anemia with hemoglobin of 7.7. She does desat with ambulation on room air. He was discharged with home oxygen on 1 L via [place].  He presents today for cardiology office visit. He is not wearing his home oxygen during the office visit today. I urged him to use at night and when he overexerts himself. He is on high-dose aspirin, metoprolol and Zocor. He will need a 6-8 weeks fasting lipid panel and LFTs. He will also need a CBC and basic metabolic panel today to follow-up on the postop anemia and renal function. Otherwise, he denies any chest pain or significant shortness breath. He continued to have diminished breath sound on both sides of the lung consistent with years of smoking.    Past Medical  History:  Diagnosis Date  . Anginal pain ([place])    with exertion  . Coronary artery disease   . Critical aortic valve stenosis   . Dyspnea    with exertion  . Murmur   . Seizure ([place])   . Syncope     Past Surgical History:  Procedure Laterality Date  . AORTIC VALVE REPLACEMENT (AVR) N/A 07/04/2017   Performed by Grace Isaac, MD at [facility] (CABG) x three, using left internal mammary artery and right leg greater saphenous vein harvested endoscopically N/A 07/04/2017   Performed by Grace Isaac, MD at [facility]  . NO PAST SURGERIES    . RIGHT/LEFT HEART CATH AND CORONARY ANGIOGRAPHY N/A 06/25/2017   Performed by Troy Sine, MD at [facility]  . THORACIC AORTOGRAM  06/25/2017   Performed by Troy Sine, MD at [facility]  . TRANSESOPHAGEAL ECHOCARDIOGRAM (TEE) N/A 07/04/2017   Performed by Grace Isaac, MD at [facility] OR    Current Medications: Outpatient Medications Prior to Visit  Medication Sig Dispense Refill  . acetaminophen (TYLENOL) 650 MG CR tablet Take 650 mg by mouth every 8 (eight) hours as needed for pain.    Marland Kitchen albuterol (PROVENTIL HFA;VENTOLIN HFA) 108 (90 Base) MCG/ACT inhaler Inhale 2 puffs into the lungs every 6 (six) hours as needed for wheezing or shortness of breath.    Marland Kitchen aspirin EC 325 MG EC tablet  Take 1 tablet (325 mg total) daily by mouth. 30 tablet 0  . levETIRAcetam (KEPPRA) 500 MG tablet Take 1 tablet (500 mg total) by mouth 2 (two) times daily. 60 tablet 4  . metoprolol tartrate (LOPRESSOR) 25 MG tablet Take 0.5 tablets (12.5 mg total) 2 (two) times daily by mouth. 30 tablet 1  . oxyCODONE (OXY IR/ROXICODONE) 5 MG immediate release tablet Take 5 mg by mouth every 4-6 hours PRN severe pain. 30 tablet 0  . simvastatin (ZOCOR) 20 MG tablet Take 1 tablet (20 mg total) daily at 6 PM by mouth. 30 tablet 1  . vitamin B-12 1000 MCG tablet Take 2 tablets (2,000 mcg total) by mouth daily. 60 tablet 3    No facility-administered medications prior to visit.      Allergies:   Patient has no known allergies.   Social History   Socioeconomic History  . Marital status: Married    Spouse name: [name]  . Number of children: 2  . Years of education: 42  . Highest education level: None  Social Needs  . Financial resource strain: None  . Food insecurity - worry: None  . Food insecurity - inability: None  . Transportation needs - medical: None  . Transportation needs - non-medical: None  Occupational History  . Occupation: Retired  Tobacco Use  . Smoking status: Former Smoker    Packs/day: 2.00    Years: 64.00    Pack years: 128.00    Types: Cigarettes    Last attempt to quit: 07/04/2017    Years since quitting: 0.0  . Smokeless tobacco: Never Used  . Tobacco comment: SINCE I WAS 76 YRS OLD  Substance and Sexual Activity  . Alcohol use: No  . Drug use: No  . Sexual activity: None  Other Topics Concern  . None  Social History Narrative   Lives with wife   Caffeine use: some   Right handed     Family History:  The patient's family history includes Hernia in his father; Stroke in his brother.   ROS:   Please see the history of present illness.    ROS All other systems reviewed and are negative.   PHYSICAL EXAM:   VS:  BP 124/68   Pulse 73   Ht 5\' 7"  (1.702 m)   Wt 150 lb (68 kg)   BMI 23.49 kg/m    GEN: Well nourished, well developed, in no acute distress  HEENT: normal  Neck: no JVD, carotid bruits, or masses Cardiac: RRR; no murmurs, rubs, or gallops,no edema  +sternal wound well-healed Respiratory:  clear to auscultation bilaterally, normal work of breathing GI: soft, nontender, nondistended, + BS MS: no deformity or atrophy  Skin: warm and dry, no rash Neuro:  Alert and Oriented x 3, Strength and sensation are intact Psych: euthymic mood, full affect  Wt Readings from Last 3 Encounters:  07/19/17 150 lb (68 kg)  07/10/17 144 lb 8 oz (65.5 kg)   07/03/17 150 lb (68 kg)      Studies/Labs Reviewed:   EKG:  EKG is ordered today.  The ekg ordered today demonstrates normal sinus rhythm, first-degree AV block  Recent Labs: 06/18/2017: TSH 3.310 07/03/2017: ALT 9 07/05/2017: Magnesium 2.8 07/19/2017: BUN 18; Creatinine, Ser 0.97; Hemoglobin 9.6; Platelets 556; Potassium 5.2; Sodium 139   Lipid Panel    Component Value Date/Time   CHOL 196 03/13/2017 2338   TRIG 74 03/13/2017 2338   HDL 47 03/13/2017 2338   CHOLHDL 4.2  03/13/2017 2338   VLDL 15 03/13/2017 2338   LDLCALC 134 (H) 03/13/2017 2338    Additional studies/ records that were reviewed today include:   Echo 05/15/2017 LV EF: 55% -   60%  ------------------------------------------------------------------- Indications:      R01.1 Murmur.  ------------------------------------------------------------------- History:   PMH:  Acquired from the patient and from the patient&'s chart.  PMH:  Murmur. Seizure. Syncope.  ------------------------------------------------------------------- Study Conclusions  - Left ventricle: The cavity size was normal. Systolic function was   normal. The estimated ejection fraction was in the range of 55%   to 60%. Wall motion was normal; there were no regional wall   motion abnormalities. Doppler parameters are consistent with   abnormal left ventricular relaxation (grade 1 diastolic   dysfunction). - Aortic valve: There was severe stenosis. Peak velocity (S): 552   cm/s. Mean gradient (S): 87 mm Hg. Valve area (Vmax): 0.4 cm^2.   Cath 06/25/2017 Conclusion     Prox LAD lesion, 70 %stenosed.  Prox LAD to Mid LAD lesion, 40 %stenosed.  Prox Cx to Mid Cx lesion, 50 %stenosed.  Prox RCA lesion, 50 %stenosed.  Prox RCA to Mid RCA lesion, 80 %stenosed.   Critical aortic valve stenosis with a peak to peak gradient of 104, mean gradient of 80 mmHg, and aortic valve area of 0.37 cm.  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  There is severely reduced aortic valve excursion with trivial AR.  Normal LV function with an EF of 50-55%;  severe left ventricular hypertrophy.  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  RECOMMENDATION: Aortic valve replacement with CABG revascularization surgery.    CABG and AVR 07/04/2017 POST-OPERATIVE DIAGNOSIS:  1. Critical aortic stenosis 2. Multivessel coronary artery disease  PROCEDURE: TRANSESOPHAGEAL ECHOCARDIOGRAM (TEE) MEDIAN STERNOTOMY for CORONARY ARTERY BYPASS GRAFTING (CABG) x 3 (LIMA to LAD, SVG to DIAGONAL, SVG to PDA) using left internal mammary artery and RIGHT leg greater saphenous vein harvested endoscopically and AORTIC VALVE REPLACEMENT (AVR) (usig Model# [id number], Serial # [id number], size 23)     ASSESSMENT:    1. S/P CABG x 3   2. Medication management   3. Mixed hyperlipidemia   4. S/P AVR (aortic valve replacement)   5. Hyperlipidemia, unspecified hyperlipidemia type   6. Anemia, unspecified type   7. AKI (acute kidney injury) ([place])      PLAN:  In order of problems listed above:  1. CAD s/p CABG: sternal wound is well-healed, denies any significant chest discomfort with ambulation. Continue high-dose aspirin, likely can transition to low-dose aspirin after three-month  2. Severe AS s/p AVR: no obvious issues. No sign of acute heart failure  3. HLD: on Zocor 20 mg daily, obtain fasting lipid panel and LFTs in 6-8 weeks  4. Anemia: postop anemia, obtain CBC today  5. AKI: obtain basic metabolic panel    Medication Adjustments/Labs and Tests Ordered: Current medicines are reviewed at length with the patient today.  Concerns regarding medicines are outlined above.  Medication changes, Labs and Tests ordered today are  listed in the Patient Instructions below. Patient Instructions   Medication Instructions:  Continue all current medications   Labwork: CBC and BMP today Fasting lipid and liver 6-8 weeks  Testing/Procedures: None ordered  Follow-Up: Your physician recommends that you schedule a follow-up  appointment in: 2-3 months with Dr. Debara Pickett   Any Other Special Instructions Will Be Listed Below (If Applicable).     If you need a refill on your cardiac medications before your next appointment, please call your pharmacy.      Hilbert Corrigan, [place]  07/21/2017 9:20 AM    [address], [place], [place]  [address] Phone: [phone]; Fax: [phone]

## 2017-07-19 NOTE — Patient Instructions (Addendum)
  Medication Instructions:  Continue all current medications   Labwork: CBC and BMP today Fasting lipid and liver 6-8 weeks  Testing/Procedures: None ordered  Follow-Up: Your physician recommends that you schedule a follow-up appointment in: 2-3 months with Dr. Debara Pickett   Any Other Special Instructions Will Be Listed Below (If Applicable).     If you need a refill on your cardiac medications before your next appointment, please call your pharmacy.

## 2017-07-21 ENCOUNTER — Encounter: Payer: Self-pay | Admitting: Physician Assistant

## 2017-07-23 ENCOUNTER — Telehealth (HOSPITAL_COMMUNITY): Payer: Self-pay | Admitting: *Deleted

## 2017-07-23 NOTE — Telephone Encounter (Signed)
-----   Message from [place], [place] sent at 07/23/2017  4:16 PM EST ----- Regarding: RE: Madaline Brilliant to start cardiac reha  Unless other indicated by CT surgery, he can start cardiac rehab after seen by CT surgery. Anemia improved as expected.   Hilbert Corrigan PA Pager: [phone]   ----- Message ----- From: Rowe Pavy, RN Sent: 07/22/2017   3:46 PM To: Almyra Deforest, PA Subject: Ok to start cardiac [place],  The above patient seen by you on 11/15 s/p 10/31 CABG x 3 and AVR.  Pt remains mildly anemic with improved Hgb on 9.6.  May pt proceed with scheduling for group exercise in cardiac rehab to begin after surgeon visit on 12/13?  Is there any additional anemia workup planned for this pt?  Thanks for your input  Maurice Small RN, BSN Cardiac and Pulmonary Rehab Nurse Navigator

## 2017-07-24 ENCOUNTER — Telehealth (HOSPITAL_COMMUNITY): Payer: Self-pay

## 2017-07-24 NOTE — Telephone Encounter (Signed)
Called and spoke with patient in regards to Cardiac Rehab - Wife is currently in the hospital and does not want to do program. Closed referral.

## 2017-08-10 ENCOUNTER — Other Ambulatory Visit: Payer: Self-pay | Admitting: Cardiothoracic Surgery

## 2017-08-10 DIAGNOSIS — Z951 Presence of aortocoronary bypass graft: Secondary | ICD-10-CM

## 2017-08-13 ENCOUNTER — Ambulatory Visit: Payer: Self-pay

## 2017-08-16 ENCOUNTER — Ambulatory Visit: Payer: Medicare Other | Admitting: Cardiothoracic Surgery

## 2017-08-21 ENCOUNTER — Encounter: Payer: Self-pay | Admitting: Physician Assistant

## 2017-08-21 ENCOUNTER — Ambulatory Visit (INDEPENDENT_AMBULATORY_CARE_PROVIDER_SITE_OTHER): Payer: Self-pay | Admitting: Physician Assistant

## 2017-08-21 ENCOUNTER — Other Ambulatory Visit: Payer: Self-pay

## 2017-08-21 ENCOUNTER — Ambulatory Visit
Admission: RE | Admit: 2017-08-21 | Discharge: 2017-08-21 | Disposition: A | Discharge status: Home / Self Care | Payer: Medicare Other | Source: Ambulatory Visit | Attending: Cardiothoracic Surgery | Admitting: Cardiothoracic Surgery

## 2017-08-21 VITALS — BP 165/81 | HR 56 | Ht 67.0 in | Wt 150.0 lb

## 2017-08-21 DIAGNOSIS — Z951 Presence of aortocoronary bypass graft: Secondary | ICD-10-CM

## 2017-08-21 DIAGNOSIS — Z952 Presence of prosthetic heart valve: Secondary | ICD-10-CM

## 2017-08-21 DIAGNOSIS — I25810 Atherosclerosis of coronary artery bypass graft(s) without angina pectoris: Secondary | ICD-10-CM | POA: Diagnosis not present

## 2017-08-21 NOTE — Progress Notes (Signed)
Danny Krause is a 76 y.o. male patient is s/p CABG x3 and an aortic valve replacement on 07/04/2017 with Dr. Servando Snare.    No diagnosis found. Past Medical History:  Diagnosis Date  . Anginal pain ([place])    with exertion  . Coronary artery disease   . Critical aortic valve stenosis   . Dyspnea    with exertion  . Murmur   . Seizure ([place])   . Syncope    No past surgical history pertinent negatives on file. Scheduled Meds: Current Outpatient Medications on File Prior to Visit  Medication Sig Dispense Refill  . acetaminophen (TYLENOL) 650 MG CR tablet Take 650 mg by mouth every 8 (eight) hours as needed for pain.    . ASPIRIN 81 PO Take by mouth daily.    Danny Krause levETIRAcetam (KEPPRA) 500 MG tablet Take 1 tablet (500 mg total) by mouth 2 (two) times daily. 60 tablet 4  . metoprolol tartrate (LOPRESSOR) 25 MG tablet Take 0.5 tablets (12.5 mg total) 2 (two) times daily by mouth. 30 tablet 1  . simvastatin (ZOCOR) 20 MG tablet Take 1 tablet (20 mg total) daily at 6 PM by mouth. 30 tablet 1  . vitamin B-12 1000 MCG tablet Take 2 tablets (2,000 mcg total) by mouth daily. 60 tablet 3   No current facility-administered medications on file prior to visit.       Blood pressure (!) 165/81, pulse (!) 56, height 5\' 7"  (1.702 m), weight 150 lb (68 kg), SpO2 93 %.  Subjective: Overall, the patient is recovering well from surgery.  He expresses frustration today that he cannot do all the things that he is used to doing before surgery.  He does still have some incisional soreness and pain when he moves certain ways.  He is frustrated that per neurology he cannot drive until the end of January due to his previous seizure.  Objective: Cor: NSR, no murmur Pulm: CTA bilaterally Abd: no tenderness Ext: no edema Incision: clean and dry without drainage   CLINICAL DATA:  76 year old male with a history of CABG and aortic valve replacement  EXAM: CHEST  2 VIEW  COMPARISON:   None.  FINDINGS: Cardiomediastinal silhouette within normal limits, with surgical changes of median sternotomy, CABG, and aortic valve replacement. No evidence of central vascular congestion. No interlobular septal thickening. No pneumothorax. No pleural effusion. No confluent airspace disease.  No displaced fracture  IMPRESSION: Surgical changes of median sternotomy, CABG, aortic valve replacement with no acute cardiopulmonary disease   Electronically Signed   By: Danny Krause D.O.   On: 08/21/2017 12:54  Assessment & Plan   The patient is a 76 year old male status post coronary bypass grafting x3 and aortic valve replacement on 07/04/2017 by Dr. Servando Snare.  Overall, he is recovering well from a surgical standpoint.  He is still requiring intermittent oxygen at home for oxygen saturation levels in the 80s.  He recently saw Dr. Debara Pickett who advised him to keep his oxygen on while ambulating and at night.  The patient shares that he has not been wearing his oxygen as regularly as suggested.  He also is frustrated that he is unable to drive at this time due to history of seizures.  He was recently started on Keppra by his neurologist and has been advised not to drive until the end of January.  He has a follow-up appointment in April to reassess.  Chest x-ray was reviewed with the patient and the son at the bedside  and it shows no acute cardiopulmonary disease.  No pneumothorax.  No pleural effusion.  Stable surgical changes of median sternotomy.  He overall feels good most days, however he does have difficulty breathing occasionally when walking distances.  He does have some soreness around his incision with palpation.  His blood pressure was slightly elevated on exam today, however the patient does state that he is quite anxious about doctor's offices.  The patient has quit smoking since the surgery.  He has had a lot of anxiety around smoking cessation and I have pointed him towards his  primary care provider for antianxiety medication.  Since his incisions are healing well and his chest x-ray was stable I am having the patient follow-up on an as-needed basis.  He is welcome to call our office anytime with questions or concerns or make an appointment if needed.  Dr. Debara Pickett will be following him from a cardiovascular standpoint and titrating his medications accordingly this point forward.   Danny Krause 08/21/2017

## 2017-08-21 NOTE — Patient Instructions (Addendum)
Endocarditis is a potentially serious infection of heart valves or inside lining of the heart.  It occurs more commonly in patients with diseased heart valves (such as patient's with aortic or mitral valve disease) and in patients who have undergone heart valve repair or replacement.  Certain surgical and dental procedures may put you at risk, such as dental cleaning, other dental procedures, or any surgery involving the respiratory, urinary, gastrointestinal tract, gallbladder or prostate gland.   To minimize your chances for developing endocarditis, maintain good oral health and seek prompt medical attention for any infections involving the mouth, teeth, gums, skin or urinary tract.    Always notify your doctor or dentist about your underlying heart valve condition before having any invasive procedures. You will need to take antibiotics before certain procedures, including all routine dental cleanings or other dental procedures.  Your cardiologist or dentist should prescribe these antibiotics for you to be taken ahead of time.   Make every effort to stay physically active, get some type of exercise on a regular basis, and stick to a "heart healthy diet".  The long term benefits for regular exercise and a healthy diet are critically important to your overall health and wellbeing.  Wear your oxygen at night and when you are walking around.   No driving until Neurology clears you to drive.

## 2017-09-03 ENCOUNTER — Other Ambulatory Visit: Payer: Self-pay | Admitting: *Deleted

## 2017-09-03 MED ORDER — METOPROLOL TARTRATE 25 MG PO TABS: 12.5000 mg | ORAL_TABLET | Freq: Two times a day (BID) | ORAL | 3 refills | Status: DC

## 2017-09-21 ENCOUNTER — Encounter: Payer: Self-pay | Admitting: Internal Medicine

## 2017-09-21 ENCOUNTER — Ambulatory Visit (INDEPENDENT_AMBULATORY_CARE_PROVIDER_SITE_OTHER): Payer: Medicare Other | Admitting: Internal Medicine

## 2017-09-21 VITALS — BP 172/78 | HR 72 | Ht 67.0 in | Wt 151.0 lb

## 2017-09-21 DIAGNOSIS — J449 Chronic obstructive pulmonary disease, unspecified: Secondary | ICD-10-CM

## 2017-09-21 DIAGNOSIS — Z951 Presence of aortocoronary bypass graft: Secondary | ICD-10-CM

## 2017-09-21 DIAGNOSIS — Z953 Presence of xenogenic heart valve: Secondary | ICD-10-CM

## 2017-09-21 MED ORDER — IPRATROPIUM-ALBUTEROL 20-100 MCG/ACT IN AERS: INHALATION_SPRAY | RESPIRATORY_TRACT | 1 refills | Status: DC

## 2017-09-21 MED ORDER — BUDESONIDE-FORMOTEROL FUMARATE 80-4.5 MCG/ACT IN AERO: 2.0000 | INHALATION_SPRAY | Freq: Two times a day (BID) | RESPIRATORY_TRACT | 1 refills | Status: DC

## 2017-09-21 NOTE — Patient Instructions (Addendum)
Dr. Debara Pickett has prescribed Symbicort (2 puffs twice daily) and Combivent (as needed) You can use Mucinex (without the -D or -DM)  Your physician recommends that you schedule a follow-up appointment in: 3 months with Dr. Debara Pickett  Primary Care Providers within 15 miles of your zip code are provided [place]: [phone]

## 2017-09-21 NOTE — Progress Notes (Signed)
OFFICE CONSULT NOTE  Chief Complaint: Hospital follow-up, shortness of breath  Primary Care Physician: Jani Gravel, MD  HPI:  Danny Krause is a 77 y.o. male who is being seen today for the evaluation of murmur at the request of Dr. Jani Gravel. Danny Krause is a pleasant 77 yo male whose wife is a patient of mine. Recently had a syncopal episode. He was doing some work outside in [employer]. He apparently passed out although this was unwitnessed. He was found down and thought possibly had a stroke. When EMS arrived they were planning to take him to the hospital and then he had a seizure. He apparently had a status post episode and eventually was broken of that seizure. He was placed on Keppra and has a follow-up with the neurologist next month. He denies any chest pain but does get short of breath. He is a 50 pack year smoker and continues to smoke. He reports when he was in his 64s he had workup for chest pain which included an evaluation that demonstrated a heart murmur. He was told that he should take antibiotics prior to dental visits but nothing more was made out of it. He did not follow-up with a doctor since then. He's not a fan of doctors, although we have significantly helped his wife.  06/01/2017  Danny Krause returns today for follow-up. He reports his wife is actually back in the hospital now. He seems to be under a lot of stress. Unfortunately the echo demonstrated severe aortic stenosis. This likely the cause of his syncopal episode and seizure. Mean gradient across the aortic valve is 87 mmHg with a calculated aortic valve area 0.4 cm. LVEF is 55-60% with normal wall motion and grade 1 diastolic dysfunction. I discussed this with him today and he seemed quite upset with the news. He understands that he will need aortic valve surgery. Given his lack of other comorbidities, I think EF she would be a good candidate for traditional aortic valve replacement, likely with a bioprosthetic  valve. He will need a heart catheterization prior to this. Right now since his wife's in the hospital, he wants to wait to schedule it until next week.  09/21/2017  Danny Krause returns today for hospital follow-up.  He reports worsening shortness of breath.  He has recently had a productive cough and rattle.  He has a history of smoking and recently stopped with his surgery.  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  He is on no therapy for this.  He is not had follow-up with his primary care provider.  He seems somewhat upset with inability to make an appointment with the office and is requesting names of other primary care providers for me today.  He denies any wheezing but gets short of breath with exertion and has a nonproductive cough.  He denies any significant weight gain, orthopnea or lower extremity edema.  He had an uneventful cardiac surgery and underwent aortic valve replacement and three-vessel coronary artery bypass grafting by Dr. Servando Snare on 07/04/2017, with placement of a 23 mm [employer] pericardial tissue valve and LIMA to the LAD, SVG to the diagonal and SVG to the distal right coronary.  He denies any anginal symptoms.  Postoperative echocardiography demonstrated normal valve function.  Danny Krause is significantly anxious and says he is not sleeping well at night.  His wife is been ill and is back in the hospital again with a  leg infection.  He's concerned that she may need amputation.  PMHx:  Past Medical History:  Diagnosis Date  . Anginal pain ([place])    with exertion  . Coronary artery disease   . Critical aortic valve stenosis   . Dyspnea    with exertion  . Murmur   . Seizure ([place])   . Syncope     Past Surgical History:  Procedure Laterality Date  . AORTIC VALVE REPLACEMENT N/A 07/04/2017   Procedure: AORTIC VALVE REPLACEMENT (AVR);  Surgeon: Grace Isaac, MD;  Location: [facility];  Service: Open  Heart Surgery;  Laterality: N/A;  . CORONARY ARTERY BYPASS GRAFT N/A 07/04/2017   Procedure: CORONARY ARTERY BYPASS GRAFTING (CABG) x three, using left internal mammary artery and right leg greater saphenous vein harvested endoscopically;  Surgeon: Grace Isaac, MD;  Location: [facility];  Service: Open Heart Surgery;  Laterality: N/A;  . NO PAST SURGERIES    . RIGHT/LEFT HEART CATH AND CORONARY ANGIOGRAPHY N/A 06/25/2017   Procedure: RIGHT/LEFT HEART CATH AND CORONARY ANGIOGRAPHY;  Surgeon: Troy Sine, MD;  Location: [facility];  Service: Cardiovascular;  Laterality: N/A;  . TEE WITHOUT CARDIOVERSION N/A 07/04/2017   Procedure: TRANSESOPHAGEAL ECHOCARDIOGRAM (TEE);  Surgeon: Grace Isaac, MD;  Location: [facility];  Service: Open Heart Surgery;  Laterality: N/A;  . THORACIC AORTOGRAM  06/25/2017   Procedure: THORACIC AORTOGRAM;  Surgeon: Troy Sine, MD;  Location: [facility];  Service: Cardiovascular;;  aortic root     FAMHx:  Family History  Problem Relation Age of Onset  . Hernia Father   . Stroke Brother     SOCHx:   reports that he quit smoking about 2 months ago. His smoking use included cigarettes. He has a 128.00 pack-year smoking history. he has never used smokeless tobacco. He reports that he does not drink alcohol or use drugs.  ALLERGIES:  No Known Allergies  ROS: Pertinent items noted in HPI and remainder of comprehensive ROS otherwise negative.  HOME MEDS: Current Outpatient Medications on File Prior to Visit  Medication Sig Dispense Refill  . acetaminophen (TYLENOL) 650 MG CR tablet Take 650 mg by mouth every 8 (eight) hours as needed for pain.    . ASPIRIN 81 PO Take by mouth daily.    Marland Kitchen levETIRAcetam (KEPPRA) 500 MG tablet Take 1 tablet (500 mg total) by mouth 2 (two) times daily. 60 tablet 4  . metoprolol tartrate (LOPRESSOR) 25 MG tablet Take 0.5 tablets (12.5 mg total) by mouth 2 (two) times daily. 30 tablet 3  . simvastatin (ZOCOR)  20 MG tablet Take 1 tablet (20 mg total) daily at 6 PM by mouth. 30 tablet 1  . vitamin B-12 1000 MCG tablet Take 2 tablets (2,000 mcg total) by mouth daily. 60 tablet 3   No current facility-administered medications on file prior to visit.     LABS/IMAGING: No results found for this or any previous visit (from the past 48 hour(s)). No results found.  LIPID PANEL:    Component Value Date/Time   CHOL 196 03/13/2017 2338   TRIG 74 03/13/2017 2338   HDL 47 03/13/2017 2338   CHOLHDL 4.2 03/13/2017 2338   VLDL 15 03/13/2017 2338   LDLCALC 134 (H) 03/13/2017 2338    WEIGHTS: Wt Readings from Last 3 Encounters:  09/21/17 151 lb (68.5 kg)  08/21/17 150 lb (68 kg)  07/19/17 150 lb (68 kg)    VITALS: BP (!) 172/78  Pulse 72   Ht 5\' 7"  (1.702 m)   Wt 151 lb (68.5 kg)   BMI 23.65 kg/m   EXAM: General appearance: alert and Mildly anxious Neck: no carotid bruit, no JVD and thyroid not enlarged, symmetric, no tenderness/mass/nodules Lungs: diminished breath sounds bilaterally and rhonchi bilaterally Heart: regular rate and rhythm Abdomen: soft, non-tender; bowel sounds normal; no masses,  no organomegaly Extremities: extremities normal, atraumatic, no cyanosis or edema Pulses: 2+ and symmetric Skin: Pale, cool, dry Neurologic: Grossly normal Psych: Anxious  EKG: Sinus rhythm with first-degree AV block at 70, left posterior fascicular block-personally reviewed  ASSESSMENT: 1. Aortic stenosis status post 23 mm [employer] pericardial tissue valve (07/2017) 2. Coronary artery disease status post three-vessel CABG with LIMA to LAD, SVG to diagonal, SVG to RCA (07/2017) 3. Dyspnea-likely secondary to COPD 4. Abnormal EKG suggesting pulmonary disease pattern 5. Recent syncopal episode 6. Seizure  PLAN: 1.   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  He says the cough has been improving somewhat however he gets short of breath  with minimal exertion.  This is likely more due to his COPD rather than valvular heart disease or his bypass which has been improved on.  I would recommend starting him on inhalers.  I provided Symbicort as well as a Combivent rescue inhaler for him.  He can use Mucinex for mucus production.  He should follow-up with his primary care provider for ongoing management of his COPD or consider referral to a pulmonologist.  Follow-up with me in 3 months.  Pixie Casino, MD, [facility], [place] Director of the Advanced Lipid Disorders &  Cardiovascular Risk Reduction Clinic Diplomate of the American Board of Clinical Lipidology Attending Cardiologist  Direct Dial: [phone]  Fax: [phone]  Website:  [email or web address] 09/21/2017, 9:56 AM

## 2017-09-27 ENCOUNTER — Telehealth: Payer: Self-pay | Admitting: Internal Medicine

## 2017-09-27 NOTE — Telephone Encounter (Signed)
Received notification from patient's [facility] pharmacy that Symbicort 80-4.22mcg inhaler is $80 with insurance and there is a request for a cheaper alternative. Called pharmacy and they cannot tell me which alternatives would be cheaper, the patient would need to seek out this information from his insurance company.  Left detailed message (per DPR) explaining situation and asking that he call back with info from insurance company about more cost-effective alternatives.   Routed to MD

## 2017-09-27 NOTE — Telephone Encounter (Signed)
It looks like all of the inhaled corticosteroid combinations are at least tier 3 or 4. May not be cheaper than that.  Dr. Lemmie Evens

## 2017-10-11 ENCOUNTER — Telehealth: Payer: Self-pay | Admitting: Neurology

## 2017-10-11 ENCOUNTER — Encounter: Payer: Self-pay | Admitting: Neurology

## 2017-10-11 NOTE — Telephone Encounter (Signed)
Pt son(on DPR) has called re: appointment pt is set for(which is already on wait list) son wants Dr Jannifer Franklin to be aware that all of pt's other doctors have signed off re: pt being given the okay to come off of the  levETIRAcetam (KEPPRA) 500 MG tablet.  Pt son is asking if pt can go ahead and now start being weaned off this medication.  Pt son will not be in town on current scheduled appointment date, this is 1 reason son is wanting pt to have sooner appointment.  Also son states pt is anxious to start driving again.  Please call pt son re: if he can come in earlier than 4-09

## 2017-10-11 NOTE — Telephone Encounter (Signed)
I called the son.  The patient has done well without any recurring seizures.  At this point, he is gone 6 months without any seizures, okay to start driving again.  He may have had a seizure because of the severe aortic stenosis.  I would keep him on Keppra for 12 months total, and consider tapering off Keppra at that time.  He may start driving now, however.

## 2017-10-15 ENCOUNTER — Telehealth: Payer: Self-pay | Admitting: Internal Medicine

## 2017-10-15 NOTE — Telephone Encounter (Signed)
Received faxed request for CMN for home oxygen from [facility]. Per Dr. Debara Pickett, he did not prescribe home oxygen. Per chart review, TCTS note states to use home oxygen at night and when moving around and was ordered by TCTS at hospital discharge. Faxed notification to [facility] to send request for CMN to TCTS Dr Servando Snare

## 2017-10-18 ENCOUNTER — Other Ambulatory Visit: Payer: Self-pay | Admitting: *Deleted

## 2017-10-18 DIAGNOSIS — J449 Chronic obstructive pulmonary disease, unspecified: Secondary | ICD-10-CM

## 2017-10-18 NOTE — Telephone Encounter (Signed)
Pixie Casino, MD  Fidel Levy, RN        Please refer to [facility] for severe COPD to help with affordable inhaler options.    Referral placed in epic

## 2017-10-19 ENCOUNTER — Telehealth: Payer: Self-pay | Admitting: Internal Medicine

## 2017-10-19 NOTE — Telephone Encounter (Signed)
Attempted to return call x2 Phone number listed has been disconnected  Per previous telephone notes, MD has referred patient to pulmonary for COPD management.   September 27, 2017  Pixie Casino, MD  to Me   10:59 AM  Note    It looks like all of the inhaled corticosteroid combinations are at least tier 3 or 4. May not be cheaper than that.  Dr. Lemmie Evens

## 2017-10-19 NOTE — Telephone Encounter (Signed)
New message  Patient spouse calling, states medication too costly.   Pt c/o medication issue:  1. Name of Medication: budesonide-formoterol (SYMBICORT) 80-4.5 MCG/ACT inhaler  2. How are you currently taking this medication (dosage and times per day)? As prescribed  3. Are you having a reaction (difficulty breathing--STAT)? No   4. What is your medication issue? Too costly,

## 2017-10-19 NOTE — Telephone Encounter (Signed)
Called patient's son Danny Krause, listed on [place]. He states that phone # that was documented that Danny Krause called in from was incorrect and he provided correct phone number and suggested I call his dad, patient.   Called Danny Krause and explained that MD has suggested a pulmonary referral and explained situation concerning cost/tier for drug. He voiced understanding and is agreeable to referral.

## 2017-12-05 ENCOUNTER — Other Ambulatory Visit: Payer: Self-pay | Admitting: Internal Medicine

## 2017-12-11 ENCOUNTER — Encounter: Payer: Self-pay | Admitting: Neurology

## 2017-12-11 ENCOUNTER — Ambulatory Visit (INDEPENDENT_AMBULATORY_CARE_PROVIDER_SITE_OTHER): Payer: Medicare Other | Admitting: Neurology

## 2017-12-11 VITALS — BP 197/86 | HR 63 | Ht 67.0 in | Wt 152.0 lb

## 2017-12-11 DIAGNOSIS — R569 Unspecified convulsions: Secondary | ICD-10-CM | POA: Diagnosis not present

## 2017-12-11 NOTE — Progress Notes (Signed)
Reason for visit: Seizures  Danny Krause is an 77 y.o. male  History of present illness:  Danny Krause is a 77 year old right-handed white male with a history of severe aortic stenosis, he has undergone an aortic valve replacement on 04 July 2017.  He has recovered from this and he is feeling more energetic, and he is able to do more things.  The patient is on Keppra for treatment of seizures that occurred at the time of onset of a syncopal event where he fell backwards and hit his head.  The patient had multiple seizures with this event that occurred on 13 March 2017.  The patient is now back to driving, he has not had any further episodes.  He is tolerating the Keppra well.  Past Medical History:  Diagnosis Date  . Anginal pain ([place])    with exertion  . Coronary artery disease   . Critical aortic valve stenosis   . Dyspnea    with exertion  . Murmur   . Seizure ([place])   . Syncope     Past Surgical History:  Procedure Laterality Date  . AORTIC VALVE REPLACEMENT N/A 07/04/2017   Procedure: AORTIC VALVE REPLACEMENT (AVR);  Surgeon: Danny Isaac, MD;  Location: [facility];  Service: Open Heart Surgery;  Laterality: N/A;  . CORONARY ARTERY BYPASS GRAFT N/A 07/04/2017   Procedure: CORONARY ARTERY BYPASS GRAFTING (CABG) x three, using left internal mammary artery and right leg greater saphenous vein harvested endoscopically;  Surgeon: Danny Isaac, MD;  Location: [facility];  Service: Open Heart Surgery;  Laterality: N/A;  . NO PAST SURGERIES    . RIGHT/LEFT HEART CATH AND CORONARY ANGIOGRAPHY N/A 06/25/2017   Procedure: RIGHT/LEFT HEART CATH AND CORONARY ANGIOGRAPHY;  Surgeon: Danny Sine, MD;  Location: [facility];  Service: Cardiovascular;  Laterality: N/A;  . TEE WITHOUT CARDIOVERSION N/A 07/04/2017   Procedure: TRANSESOPHAGEAL ECHOCARDIOGRAM (TEE);  Surgeon: Danny Isaac, MD;  Location: [facility];  Service: Open Heart Surgery;  Laterality: N/A;  . THORACIC  AORTOGRAM  06/25/2017   Procedure: THORACIC AORTOGRAM;  Surgeon: Danny Sine, MD;  Location: [facility];  Service: Cardiovascular;;  aortic root     Family History  Problem Relation Age of Onset  . Hernia Father   . Stroke Brother     Social history:  reports that he quit smoking about 5 months ago. His smoking use included cigarettes. He has a 128.00 pack-year smoking history. He has never used smokeless tobacco. He reports that he does not drink alcohol or use drugs.   No Known Allergies  Medications:  Prior to Admission medications   Medication Sig Start Date End Date Taking? Authorizing Provider  acetaminophen (TYLENOL) 650 MG CR tablet Take 650 mg by mouth every 8 (eight) hours as needed for pain.   Yes [provider]  ASPIRIN 81 PO Take by mouth daily.   Yes [provider]  budesonide-formoterol (SYMBICORT) 80-4.5 MCG/ACT inhaler Inhale 2 puffs into the lungs 2 (two) times daily. 09/21/17  Yes Hilty, Nadean Corwin, MD  Ipratropium-Albuterol (COMBIVENT) 20-100 MCG/ACT AERS respimat Use 1 puff as needed every 4-6 hours. 09/21/17  Yes Hilty, Nadean Corwin, MD  levETIRAcetam (KEPPRA) 500 MG tablet Take 1 tablet (500 mg total) by mouth 2 (two) times daily. 03/15/17  Yes Danny Dumas, MD  metoprolol tartrate (LOPRESSOR) 25 MG tablet TAKE 1/2 TABLET BY MOUTH TWICE A DAY 12/05/17  Yes Hilty, Nadean Corwin, MD  simvastatin (ZOCOR) 20 MG tablet Take 1 tablet (20 mg total) daily at 6 PM by mouth. 07/10/17  Yes Danny Pinks M, PA-C  vitamin B-12 1000 MCG tablet Take 2 tablets (2,000 mcg total) by mouth daily. 03/16/17  Yes Danny Dumas, MD    ROS:  Out of a complete 14 system review of symptoms, the patient complains only of the following symptoms, and all other reviewed systems are negative.  History of seizures  Blood pressure (!) 197/86, pulse 63, height 5\' 7"  (1.702 Krause), weight 152 lb (68.9 kg).  Physical Exam  General: The patient is alert and cooperative at  the time of the examination.  Skin: No significant peripheral edema is noted.   Neurologic Exam  Mental status: The patient is alert and oriented x 3 at the time of the examination. The patient has apparent normal recent and remote memory, with an apparently normal attention span and concentration ability.   Cranial nerves: Facial symmetry is present. Speech is normal, no aphasia or dysarthria is noted. Extraocular movements are full. Visual fields are full.  Motor: The patient has good strength in all 4 extremities.  Sensory examination: Soft touch sensation is symmetric on the face, arms, and legs.  Coordination: The patient has good finger-nose-finger and heel-to-shin bilaterally.  Gait and station: The patient has a normal gait. Tandem gait is normal. Romberg is negative. No drift is seen.  Reflexes: Deep tendon reflexes are symmetric.   Assessment/Plan:  1.  Seizures, likely symptomatic  2.  Aortic valve stenosis, status post aortic valve replacement  The patient is doing well at this point.  We will see him back in about 4 months, we will plan on initiating a taper off of Keppra if he is doing well at that time.  I do not believe that this patient has a primary epilepsy syndrome.  Jill Alexanders MD 12/11/2017 4:02 PM  [facility] [address] [place], [place] [address]  Phone [phone] Fax [phone]

## 2017-12-18 ENCOUNTER — Inpatient Hospital Stay (HOSPITAL_COMMUNITY): Payer: Medicare Other

## 2017-12-18 ENCOUNTER — Emergency Department (HOSPITAL_COMMUNITY): Payer: Medicare Other

## 2017-12-18 ENCOUNTER — Other Ambulatory Visit: Payer: Self-pay

## 2017-12-18 ENCOUNTER — Encounter (HOSPITAL_COMMUNITY): Payer: Self-pay | Admitting: Radiology

## 2017-12-18 ENCOUNTER — Inpatient Hospital Stay (HOSPITAL_COMMUNITY)
Admission: EM | Admit: 2017-12-18 | Discharge: 2017-12-20 | DRG: 100 | Disposition: A | Discharge status: Home / Self Care | Payer: Medicare Other | Attending: Family Medicine | Admitting: Family Medicine

## 2017-12-18 DIAGNOSIS — J9611 Chronic respiratory failure with hypoxia: Secondary | ICD-10-CM | POA: Diagnosis not present

## 2017-12-18 DIAGNOSIS — Z7951 Long term (current) use of inhaled steroids: Secondary | ICD-10-CM | POA: Diagnosis not present

## 2017-12-18 DIAGNOSIS — J9602 Acute respiratory failure with hypercapnia: Secondary | ICD-10-CM

## 2017-12-18 DIAGNOSIS — Z953 Presence of xenogenic heart valve: Secondary | ICD-10-CM | POA: Diagnosis not present

## 2017-12-18 DIAGNOSIS — D649 Anemia, unspecified: Secondary | ICD-10-CM | POA: Diagnosis present

## 2017-12-18 DIAGNOSIS — Z7982 Long term (current) use of aspirin: Secondary | ICD-10-CM

## 2017-12-18 DIAGNOSIS — J449 Chronic obstructive pulmonary disease, unspecified: Secondary | ICD-10-CM | POA: Diagnosis present

## 2017-12-18 DIAGNOSIS — J9621 Acute and chronic respiratory failure with hypoxia: Secondary | ICD-10-CM | POA: Diagnosis present

## 2017-12-18 DIAGNOSIS — R4020 Unspecified coma: Secondary | ICD-10-CM | POA: Diagnosis not present

## 2017-12-18 DIAGNOSIS — Z79899 Other long term (current) drug therapy: Secondary | ICD-10-CM | POA: Diagnosis not present

## 2017-12-18 DIAGNOSIS — R55 Syncope and collapse: Secondary | ICD-10-CM | POA: Diagnosis not present

## 2017-12-18 DIAGNOSIS — J9600 Acute respiratory failure, unspecified whether with hypoxia or hypercapnia: Secondary | ICD-10-CM | POA: Diagnosis not present

## 2017-12-18 DIAGNOSIS — G9340 Encephalopathy, unspecified: Secondary | ICD-10-CM | POA: Diagnosis not present

## 2017-12-18 DIAGNOSIS — G40901 Epilepsy, unspecified, not intractable, with status epilepticus: Secondary | ICD-10-CM | POA: Diagnosis not present

## 2017-12-18 DIAGNOSIS — Z87891 Personal history of nicotine dependence: Secondary | ICD-10-CM

## 2017-12-18 DIAGNOSIS — Z9981 Dependence on supplemental oxygen: Secondary | ICD-10-CM | POA: Diagnosis not present

## 2017-12-18 DIAGNOSIS — I2510 Atherosclerotic heart disease of native coronary artery without angina pectoris: Secondary | ICD-10-CM | POA: Diagnosis present

## 2017-12-18 DIAGNOSIS — R569 Unspecified convulsions: Secondary | ICD-10-CM | POA: Diagnosis not present

## 2017-12-18 DIAGNOSIS — J9601 Acute respiratory failure with hypoxia: Secondary | ICD-10-CM | POA: Diagnosis present

## 2017-12-18 DIAGNOSIS — E872 Acidosis: Secondary | ICD-10-CM | POA: Diagnosis present

## 2017-12-18 DIAGNOSIS — J9622 Acute and chronic respiratory failure with hypercapnia: Secondary | ICD-10-CM | POA: Diagnosis present

## 2017-12-18 DIAGNOSIS — Z951 Presence of aortocoronary bypass graft: Secondary | ICD-10-CM | POA: Diagnosis not present

## 2017-12-18 DIAGNOSIS — Z4682 Encounter for fitting and adjustment of non-vascular catheter: Secondary | ICD-10-CM | POA: Diagnosis not present

## 2017-12-18 LAB — I-STAT ARTERIAL BLOOD GAS, ED
ACID-BASE DEFICIT: 2 mmol/L (ref 0.0–2.0)
Acid-base deficit: 1 mmol/L (ref 0.0–2.0)
BICARBONATE: 26.1 mmol/L (ref 20.0–28.0)
Bicarbonate: 26.9 mmol/L (ref 20.0–28.0)
O2 SAT: 100 %
O2 Saturation: 100 %
PCO2 ART: 60.8 mmHg — AB (ref 32.0–48.0)
PH ART: 7.253 — AB (ref 7.350–7.450)
PH ART: 7.268 — AB (ref 7.350–7.450)
PO2 ART: 455 mmHg — AB (ref 83.0–108.0)
PO2 ART: 236 mmHg — AB (ref 83.0–108.0)
Patient temperature: 98.6
Patient temperature: 98.6
TCO2: 29 mmol/L (ref 22–32)
TCO2: 28 mmol/L (ref 22–32)
pCO2 arterial: 57 mmHg — ABNORMAL HIGH (ref 32.0–48.0)

## 2017-12-18 LAB — BLOOD GAS, ARTERIAL
Acid-base deficit: 1.6 mmol/L (ref 0.0–2.0)
Bicarbonate: 27.7 mmol/L (ref 20.0–28.0)
Drawn by: 40415
O2 CONTENT: 5 L/min
O2 SAT: 79.1 %
PCO2 ART: 99.5 mmHg — AB (ref 32.0–48.0)
PO2 ART: 64.2 mmHg — AB (ref 83.0–108.0)
Patient temperature: 98.6
pH, Arterial: 7.073 — CL (ref 7.350–7.450)

## 2017-12-18 LAB — CBC WITH DIFFERENTIAL/PLATELET
BASOS ABS: 0 10*3/uL (ref 0.0–0.1)
BASOS PCT: 0 %
Eosinophils Absolute: 0.7 10*3/uL (ref 0.0–0.7)
Eosinophils Relative: 6 %
HEMATOCRIT: 35.7 % — AB (ref 39.0–52.0)
HEMOGLOBIN: 10.9 g/dL — AB (ref 13.0–17.0)
Lymphocytes Relative: 37 %
Lymphs Abs: 4.4 10*3/uL — ABNORMAL HIGH (ref 0.7–4.0)
MCH: 24.5 pg — ABNORMAL LOW (ref 26.0–34.0)
MCHC: 30.5 g/dL (ref 30.0–36.0)
MCV: 80.4 fL (ref 78.0–100.0)
Monocytes Absolute: 0.8 10*3/uL (ref 0.1–1.0)
Monocytes Relative: 7 %
NEUTROS ABS: 5.8 10*3/uL (ref 1.7–7.7)
NEUTROS PCT: 50 %
Platelets: 283 10*3/uL (ref 150–400)
RBC: 4.44 MIL/uL (ref 4.22–5.81)
RDW: 15.1 % (ref 11.5–15.5)
WBC: 11.7 10*3/uL — ABNORMAL HIGH (ref 4.0–10.5)

## 2017-12-18 LAB — I-STAT CHEM 8, ED
BUN: 25 mg/dL — ABNORMAL HIGH (ref 6–20)
CALCIUM ION: 1.04 mmol/L — AB (ref 1.15–1.40)
CHLORIDE: 101 mmol/L (ref 101–111)
Creatinine, Ser: 1.2 mg/dL (ref 0.61–1.24)
Glucose, Bld: 201 mg/dL — ABNORMAL HIGH (ref 65–99)
HCT: 36 % — ABNORMAL LOW (ref 39.0–52.0)
HEMOGLOBIN: 12.2 g/dL — AB (ref 13.0–17.0)
POTASSIUM: 4.2 mmol/L (ref 3.5–5.1)
Sodium: 134 mmol/L — ABNORMAL LOW (ref 135–145)
TCO2: 25 mmol/L (ref 22–32)

## 2017-12-18 LAB — COMPREHENSIVE METABOLIC PANEL
ALK PHOS: 71 U/L (ref 38–126)
ALT: 12 U/L — ABNORMAL LOW (ref 17–63)
ANION GAP: 11 (ref 5–15)
AST: 21 U/L (ref 15–41)
Albumin: 4.1 g/dL (ref 3.5–5.0)
BILIRUBIN TOTAL: 0.7 mg/dL (ref 0.3–1.2)
BUN: 24 mg/dL — ABNORMAL HIGH (ref 6–20)
CALCIUM: 8.9 mg/dL (ref 8.9–10.3)
CO2: 23 mmol/L (ref 22–32)
Chloride: 98 mmol/L — ABNORMAL LOW (ref 101–111)
Creatinine, Ser: 1.26 mg/dL — ABNORMAL HIGH (ref 0.61–1.24)
GFR calc non Af Amer: 54 mL/min — ABNORMAL LOW (ref >60)
Glucose, Bld: 201 mg/dL — ABNORMAL HIGH (ref 65–99)
Potassium: 4.1 mmol/L (ref 3.5–5.1)
Sodium: 132 mmol/L — ABNORMAL LOW (ref 135–145)
TOTAL PROTEIN: 7.6 g/dL (ref 6.5–8.1)

## 2017-12-18 LAB — TROPONIN I: Troponin I: <0.03 ng/mL (ref <0.03)

## 2017-12-18 LAB — I-STAT TROPONIN, ED: Troponin i, poc: 0 ng/mL (ref 0.00–0.08)

## 2017-12-18 LAB — MAGNESIUM: MAGNESIUM: 2.1 mg/dL (ref 1.7–2.4)

## 2017-12-18 LAB — TRIGLYCERIDES: Triglycerides: 138 mg/dL (ref <150)

## 2017-12-18 LAB — PHOSPHORUS: PHOSPHORUS: 4.7 mg/dL — AB (ref 2.5–4.6)

## 2017-12-18 LAB — CK: Total CK: 51 U/L (ref 49–397)

## 2017-12-18 MED ORDER — FENTANYL CITRATE (PF) 100 MCG/2ML IJ SOLN
50.0000 ug | INTRAMUSCULAR | Status: DC | PRN
  Administered 2017-12-18: 50 ug via INTRAVENOUS
  Filled 2017-12-18: qty 2

## 2017-12-18 MED ORDER — PROPOFOL 1000 MG/100ML IV EMUL
5.0000 ug/kg/min | INTRAVENOUS | Status: DC
  Administered 2017-12-18: 5 ug/kg/min via INTRAVENOUS
  Administered 2017-12-19 (×2): 30 ug/kg/min via INTRAVENOUS
  Filled 2017-12-18 (×2): qty 100

## 2017-12-18 MED ORDER — HEPARIN SODIUM (PORCINE) 5000 UNIT/ML IJ SOLN
5000.0000 [IU] | Freq: Three times a day (TID) | INTRAMUSCULAR | Status: DC
  Administered 2017-12-19 – 2017-12-20 (×5): 5000 [IU] via SUBCUTANEOUS
  Filled 2017-12-18 (×6): qty 1

## 2017-12-18 MED ORDER — FAMOTIDINE IN NACL 20-0.9 MG/50ML-% IV SOLN
20.0000 mg | Freq: Two times a day (BID) | INTRAVENOUS | Status: DC
  Administered 2017-12-18 – 2017-12-20 (×4): 20 mg via INTRAVENOUS
  Filled 2017-12-18 (×4): qty 50

## 2017-12-18 MED ORDER — METHYLPREDNISOLONE SODIUM SUCC 125 MG IJ SOLR: 125.0000 mg | Freq: Once | INTRAMUSCULAR | Status: DC

## 2017-12-18 MED ORDER — LORAZEPAM 2 MG/ML IJ SOLN
INTRAMUSCULAR | Status: AC
  Administered 2017-12-18: 2 mg
  Filled 2017-12-18: qty 1

## 2017-12-18 MED ORDER — ETOMIDATE 2 MG/ML IV SOLN
INTRAVENOUS | Status: AC | PRN
  Administered 2017-12-18: 20 mg via INTRAVENOUS

## 2017-12-18 MED ORDER — FENTANYL CITRATE (PF) 100 MCG/2ML IJ SOLN
50.0000 ug | INTRAMUSCULAR | Status: DC | PRN
  Administered 2017-12-19: 50 ug via INTRAVENOUS
  Filled 2017-12-18: qty 2

## 2017-12-18 MED ORDER — MIDAZOLAM HCL 2 MG/2ML IJ SOLN: 1.0000 mg | INTRAMUSCULAR | Status: DC | PRN

## 2017-12-18 MED ORDER — CHLORHEXIDINE GLUCONATE 0.12% ORAL RINSE (MEDLINE KIT)
15.0000 mL | Freq: Two times a day (BID) | OROMUCOSAL | Status: DC
  Administered 2017-12-19: 15 mL via OROMUCOSAL

## 2017-12-18 MED ORDER — ORAL CARE MOUTH RINSE: 15.0000 mL | Freq: Four times a day (QID) | OROMUCOSAL | Status: DC

## 2017-12-18 MED ORDER — SODIUM CHLORIDE 0.9 % IV SOLN: 250.0000 mL | INTRAVENOUS | Status: DC | PRN

## 2017-12-18 MED ORDER — DOCUSATE SODIUM 50 MG/5ML PO LIQD
100.0000 mg | Freq: Two times a day (BID) | ORAL | Status: DC | PRN
  Filled 2017-12-18: qty 10

## 2017-12-18 MED ORDER — MIDAZOLAM HCL 2 MG/2ML IJ SOLN
1.0000 mg | INTRAMUSCULAR | Status: DC | PRN
  Administered 2017-12-19 (×2): 1 mg via INTRAVENOUS
  Filled 2017-12-18 (×2): qty 2

## 2017-12-18 MED ORDER — IPRATROPIUM-ALBUTEROL 0.5-2.5 (3) MG/3ML IN SOLN
3.0000 mL | Freq: Four times a day (QID) | RESPIRATORY_TRACT | Status: DC
  Administered 2017-12-18 – 2017-12-20 (×7): 3 mL via RESPIRATORY_TRACT
  Filled 2017-12-18 (×7): qty 3

## 2017-12-18 MED ORDER — INSULIN ASPART 100 UNIT/ML ~~LOC~~ SOLN
1.0000 [IU] | SUBCUTANEOUS | Status: DC
  Administered 2017-12-19: 1 [IU] via SUBCUTANEOUS
  Filled 2017-12-18: qty 1

## 2017-12-18 MED ORDER — SODIUM CHLORIDE 0.9 % IV BOLUS
500.0000 mL | Freq: Once | INTRAVENOUS | Status: AC
  Administered 2017-12-18: 500 mL via INTRAVENOUS

## 2017-12-18 MED ORDER — LEVETIRACETAM IN NACL 1000 MG/100ML IV SOLN
1000.0000 mg | Freq: Two times a day (BID) | INTRAVENOUS | Status: DC
  Administered 2017-12-19 – 2017-12-20 (×3): 1000 mg via INTRAVENOUS
  Filled 2017-12-18 (×4): qty 100

## 2017-12-18 MED ORDER — BUDESONIDE 0.5 MG/2ML IN SUSP
0.5000 mg | Freq: Two times a day (BID) | RESPIRATORY_TRACT | Status: DC
  Administered 2017-12-18 – 2017-12-20 (×4): 0.5 mg via RESPIRATORY_TRACT
  Filled 2017-12-18 (×4): qty 2

## 2017-12-18 MED ORDER — HYDROMORPHONE HCL 2 MG/ML IJ SOLN
1.0000 mg | Freq: Once | INTRAMUSCULAR | Status: AC
  Administered 2017-12-18: 1 mg via INTRAVENOUS
  Filled 2017-12-18: qty 1

## 2017-12-18 MED ORDER — ARFORMOTEROL TARTRATE 15 MCG/2ML IN NEBU
15.0000 ug | INHALATION_SOLUTION | Freq: Two times a day (BID) | RESPIRATORY_TRACT | Status: DC
  Administered 2017-12-18 – 2017-12-20 (×4): 15 ug via RESPIRATORY_TRACT
  Filled 2017-12-18 (×4): qty 2

## 2017-12-18 MED ORDER — LEVETIRACETAM IN NACL 1000 MG/100ML IV SOLN
1000.0000 mg | Freq: Once | INTRAVENOUS | Status: AC
  Administered 2017-12-19: 1000 mg via INTRAVENOUS
  Filled 2017-12-18: qty 100

## 2017-12-18 NOTE — ED Notes (Signed)
PAGED CHAPLAIN

## 2017-12-18 NOTE — H&P (Addendum)
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 The patient was sedated, therefore history obtained from chart review and patient's son at bedside. The patient was in normal state of health till 6:30am this morning when the patient's family noticed that the patient was staring out into space, stiffened up, and kept repeating "yup" to any question that he was asked. Family noted that the patient became unresponsive subsequently and they started CPR at home prior to calling ambulance. EMS found patient near apneic on scene and they gave bag mask ventilations.  The patient has not had any recent alcohol consumption, illicit drug use, medication changes, sick contacts, and is not on CPAP/BiPAP. No obvious seizure trigger noted other than the patient being stressed recently due to his wife being hospitalized for an amputation.   The patient's last and only other seizure episode was on March 04, 2017 when he was found down in his yard. He was subsequently placed on Keppra and has been compliant with it.   On admission, patient's ABG noted ph=7.07, pco2=99.5, po2=64.2. Vitals found patient hypertensive (210s/100s), tachycardic (100s), tachypnic (20s), hypoxic (low 80s). ED provider noted left gaze deviation on admission. Chest xray shows et tube in place, no consolidation or effusions noted. CT head was without any acute intracranial abnormalities. Patient was intubated, given solumedrol, ativan, dilaudid, amidate, 500cc fluid bolus, and propofol in the ED.   PAST MEDICAL HISTORY :  He  has a past medical history of Anginal pain ([place]), Coronary artery disease,  Critical aortic valve stenosis, Dyspnea, Murmur, Seizure ([place]), and Syncope.  PAST SURGICAL HISTORY: He  has a past surgical history that includes No past surgeries; RIGHT/LEFT HEART CATH AND CORONARY ANGIOGRAPHY (N/A, 06/25/2017); THORACIC AORTOGRAM (06/25/2017); Aortic valve replacement (N/A, 07/04/2017); Coronary artery bypass graft (N/A, 07/04/2017); and TEE without cardioversion (N/A, 07/04/2017).  No Known Allergies  No current facility-administered medications on file prior to encounter.    Current Outpatient Medications on File Prior to Encounter  Medication Sig  . acetaminophen (TYLENOL) 650 MG CR tablet Take 650 mg by mouth every 8 (eight) hours as needed for pain.  . ASPIRIN 81 PO Take by mouth daily.  . budesonide-formoterol (SYMBICORT) 80-4.5 MCG/ACT inhaler Inhale 2 puffs into the lungs 2 (two) times daily.  . Ipratropium-Albuterol (COMBIVENT) 20-100 MCG/ACT AERS respimat Use 1 puff as needed every 4-6 hours.  . levETIRAcetam (KEPPRA) 500 MG tablet Take 1 tablet (500 mg total) by mouth 2 (two) times daily.  . metoprolol tartrate (LOPRESSOR) 25 MG tablet TAKE 1/2 TABLET BY MOUTH TWICE A DAY  . simvastatin (ZOCOR) 20 MG tablet Take 1 tablet (20 mg total) daily at 6 PM by mouth.  . vitamin B-12 1000 MCG tablet Take 2 tablets (2,000 mcg total) by mouth daily.    FAMILY HISTORY:  His indicated that his mother is deceased. He indicated that his father is deceased. He indicated that the status of his brother is unknown.   SOCIAL HISTORY: He  reports that he quit smoking about 5 months ago. His smoking use included cigarettes. He has a 128.00 pack-year smoking history. He has never used smokeless tobacco.  He reports that he does not drink alcohol or use drugs.  SUBJECTIVE:  Patient is sedated and intubated  VITAL SIGNS: BP (!) 212/102   Pulse (!) 103   Resp (!) 22   SpO2 92%   VENTILATOR SETTINGS: Vent Mode: PRVC FiO2 (%):  [100 %] 100 % Set Rate:  [22 bmp] 22 bmp Vt  Set:  [530 mL] 530 mL PEEP:  [5 cmH20] 5 cmH20 Plateau Pressure:  [14 cmH20] 14 cmH20  INTAKE / OUTPUT: No intake/output data recorded.  PHYSICAL EXAMINATION: Physical Exam  Constitutional: He appears well-developed and well-nourished. No distress.  HENT:  Head: Normocephalic and atraumatic.  Eyes: Pupils are equal, round, and reactive to light. Conjunctivae are normal.  No gaze deficits noted  Cardiovascular: Normal rate and regular rhythm.  Murmur heard. Respiratory: Effort normal. No respiratory distress. He has no wheezes.  GI: Soft. Bowel sounds are normal. He exhibits no distension. There is no tenderness.  Musculoskeletal: He exhibits no edema.  Neurological: He exhibits normal muscle tone.  Patellar reflexes intact bilaterally  Skin: He is not diaphoretic. No erythema.   LABS:  BMET Recent Labs  Lab 12/18/17 2002 12/18/17 2032  NA 132* 134*  K 4.1 4.2  CL 98* 101  CO2 23  --   BUN 24* 25*  CREATININE 1.26* 1.20  GLUCOSE 201* 201*    Electrolytes Recent Labs  Lab 12/18/17 2002  CALCIUM 8.9    CBC Recent Labs  Lab 12/18/17 2002 12/18/17 2032  WBC 11.7*  --   HGB 10.9* 12.2*  HCT 35.7* 36.0*  PLT 283  --     Coag's No results for input(s): APTT, INR in the last 168 hours.  Sepsis Markers No results for input(s): LATICACIDVEN, PROCALCITON, O2SATVEN in the last 168 hours.  ABG Recent Labs  Lab 12/18/17 2005  PHART 7.073*  PCO2ART 99.5*  PO2ART 64.2*    Liver Enzymes Recent Labs  Lab 12/18/17 2002  AST 21  ALT 12*  ALKPHOS 71  BILITOT 0.7  ALBUMIN 4.1    Cardiac Enzymes No results for input(s): TROPONINI, PROBNP in the last 168 hours.  Glucose No results for input(s): GLUCAP in the last 168 hours.  Imaging Dg Chest Portable 1 View  Result Date: 12/18/2017 CLINICAL DATA:  77 year old male with a history of endotracheal tube placement EXAM: PORTABLE CHEST 1 VIEW COMPARISON:  07/08/2017 FINDINGS: Cardiomediastinal silhouette  unchanged in size and contour, partially obscured by defibrillator pads. Surgical changes of median sternotomy and CABG. Interval placement of endotracheal tube, with the tip appearing to terminate 5 cm above the carina. Interval placement of gastric tube projecting over the mediastinum and terminating out of the field of view. No significant interlobular septal thickening. No confluent airspace disease. No pneumothorax or pleural effusion. IMPRESSION: Interval placement of endotracheal tube, terminating suitably above the carina, approximately 5.0 cm. Interval placement of gastric tube terminating out of the field of view. Surgical changes of median sternotomy and CABG. Defibrillator pads project over the mediastinum. Electronically Signed   By: Corrie Mckusick D.O.   On: 12/18/2017 20:37   CULTURES: None  ANTIBIOTICS: none  SIGNIFICANT EVENTS: Intubated 4/16  LINES/TUBES: Left and right antecubital peripheral IV  DISCUSSION: 77 y.o male with COPD, CAD s/p CABG x3, porcine aortic valve resplacement, hx of seizure disorder on keppra who presented with altered mental status thought to be secondary to seizure. Ct head without any acute intracranial abnormalities. ABG significant for acute hypoxic hypercapnic respiratory failure for which  he was s/p intubation.   ASSESSMENT / PLAN:  PULMONARY A: Acute hypoxic hypercapnic respiratory failure Hx. COPD on home oxygen  P:   Brovana bid Pulmicort bid Duoneb q6hrs  Intubated currently on PRVC setting  ABG pending  CARDIOVASCULAR A:  CAD s/p CABG in 2018 S/p porcine aortic valve replacement  P:  Telemetry Trending troponin Echocardiogram pending  RENAL A:   none  P:   Follow bmp Mg and phos pending  GASTROINTESTINAL A:   PPI prophylaxis  P:   IV famotidine Colace  HEMATOLOGIC A:   Normocytic anemia  P:  Follow CBCs SCDs and heparin  INFECTIOUS A:   None  P:   Procalcitonin pending   ENDOCRINE A:    None  P:   SSI  NEUROLOGIC A:   Acute encephalopathy thought to be secondary to seizure  P:   CT head without any acute intracranial abnormalities Appreciate neurology recommendations UDS pending CK pending UA pending Seizure precautions Propofol, versed, fentanyl IV keppra 1000mg  given, increased keppra to 1g bid Pending echocardiogram NPO  FAMILY  - Updates: Patient's son at bedside and provided updates.   Pulmonary and [place] Pager: [phone]  12/18/2017, 9:20 PM

## 2017-12-18 NOTE — ED Provider Notes (Signed)
[facility] EMERGENCY DEPARTMENT Provider Note   CSN: [id number] Arrival date & time:        History   Chief Complaint Chief Complaint  Patient presents with  . Loss of Consciousness  . Respiratory Distress  . Seizures    HPI Danny Krause is a 77 y.o. male.  77 year old male arrives by EMS for acute onset alteration in mental status.  Patient with prior history significant for COPD, aortic valve replacement, CABG, and seizures.  Per EMS patient was coming from home.  Patient apparently was decreased in his mental status.  Family reported possible seizure activity.  Patient is reportedly on [place] for same.  EMS reports the patient was near apneic on scene.  Patient's ventilations are being assisted with bag valve mask upon arrival.  Patient is unable to provide history secondary to condition.    The history is provided by the patient, medical records and the EMS personnel. The history is limited by the condition of the patient.  Altered Mental Status   This is a new problem. The current episode started 1 to 2 hours ago. The problem has not changed since onset.Associated symptoms include somnolence, seizures and unresponsiveness. His past medical history is significant for COPD.    Past Medical History:  Diagnosis Date  . Anginal pain ([place])    with exertion  . Coronary artery disease   . Critical aortic valve stenosis   . Dyspnea    with exertion  . Murmur   . Seizure ([place])   . Syncope     Patient Active Problem List   Diagnosis Date Noted  . COPD, group B, by GOLD 2017 classification ([place]) 09/21/2017  . 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 S/P aortic valve replacement with bioprosthetic valve 07/04/2017  . Syncope 06/01/2017  . Cardiac murmur 05/08/2017  . Seizure ([place]) 03/13/2017    Past Surgical History:  Procedure Laterality Date  . AORTIC VALVE REPLACEMENT N/A 07/04/2017   Procedure: AORTIC VALVE REPLACEMENT (AVR);  Surgeon: Grace Isaac, MD;  Location: [facility];  Service: Open Heart Surgery;  Laterality: N/A;  . CORONARY ARTERY BYPASS GRAFT N/A 07/04/2017   Procedure: CORONARY ARTERY BYPASS GRAFTING (CABG) x three, using left internal mammary artery and right leg greater saphenous vein harvested endoscopically;  Surgeon: Grace Isaac, MD;  Location: [facility];  Service: Open Heart Surgery;  Laterality: N/A;  . NO PAST SURGERIES    . RIGHT/LEFT HEART CATH AND CORONARY ANGIOGRAPHY N/A 06/25/2017   Procedure: RIGHT/LEFT HEART CATH AND CORONARY ANGIOGRAPHY;  Surgeon: Troy Sine, MD;  Location: [facility];  Service: Cardiovascular;  Laterality: N/A;  . TEE WITHOUT CARDIOVERSION N/A 07/04/2017   Procedure: TRANSESOPHAGEAL ECHOCARDIOGRAM (TEE);  Surgeon: Grace Isaac, MD;  Location: [facility];  Service: Open Heart Surgery;  Laterality: N/A;  . THORACIC AORTOGRAM  06/25/2017   Procedure: THORACIC AORTOGRAM;  Surgeon: Troy Sine, MD;  Location: [facility];  Service: Cardiovascular;;  aortic root         Home Medications    Prior to Admission medications   Medication Sig Start Date End Date Taking? Authorizing Provider  acetaminophen (TYLENOL) 650 MG CR tablet Take 650 mg by mouth every 8 (eight) hours as needed for pain.    [provider]  ASPIRIN 81 PO Take by mouth daily.    [provider]  budesonide-formoterol (SYMBICORT) 80-4.5 MCG/ACT inhaler Inhale 2 puffs into the lungs 2 (two)  times daily. 09/21/17   Hilty, Nadean Corwin, MD  Ipratropium-Albuterol (COMBIVENT) 20-100 MCG/ACT AERS respimat Use 1 puff as needed every 4-6 hours. 09/21/17   Hilty, Nadean Corwin, MD  levETIRAcetam (KEPPRA) 500 MG tablet Take 1 tablet (500 mg total) by mouth 2 (two) times daily. 03/15/17   Reyne Dumas, MD  metoprolol tartrate (LOPRESSOR) 25 MG tablet TAKE 1/2 TABLET BY MOUTH TWICE A DAY 12/05/17   Hilty, Nadean Corwin, MD  simvastatin (ZOCOR) 20 MG tablet Take 1 tablet (20 mg total) daily at 6 PM by mouth. 07/10/17    Nani Skillern, PA-C  vitamin B-12 1000 MCG tablet Take 2 tablets (2,000 mcg total) by mouth daily. 03/16/17   Reyne Dumas, MD    Family History Family History  Problem Relation Age of Onset  . Hernia Father   . Stroke Brother     Social History Social History   Tobacco Use  . Smoking status: Former Smoker    Packs/day: 2.00    Years: 64.00    Pack years: 128.00    Types: Cigarettes    Last attempt to quit: 07/04/2017    Years since quitting: 0.4  . Smokeless tobacco: Never Used  . Tobacco comment: SINCE I WAS 77 YRS OLD  Substance Use Topics  . Alcohol use: No  . Drug use: No     Allergies   Patient has no known allergies.   Review of Systems Review of Systems  Unable to perform ROS: Acuity of condition  Neurological: Positive for seizures.     Physical Exam Updated Vital Signs BP (!) 212/102   Pulse (!) 103   Resp (!) 22   SpO2 92%   Physical Exam  Constitutional: He appears distressed.  Obtunded Not responsive to painful stimuli  Shallow respirations with hypoxia into the low 80s on room air  EMS is assisting respiration with BVM  HENT:  Head: Normocephalic and atraumatic.  Mouth/Throat: Oropharynx is clear and moist.  Eyes: Pupils are equal, round, and reactive to light. Conjunctivae and EOM are normal.  Neck: Normal range of motion. Neck supple.  Cardiovascular: Normal rate, regular rhythm and normal heart sounds.  Pulmonary/Chest: Breath sounds normal. He is in respiratory distress.  Shallow respirations  Abdominal: Soft. He exhibits no distension. There is no tenderness.  Musculoskeletal: Normal range of motion. He exhibits no edema or deformity.  Neurological:  Obtunded   Skin: Skin is warm and dry.  Psychiatric: He has a normal mood and affect.  Nursing note and vitals reviewed.    ED Treatments / Results  Labs (all labs ordered are listed, but only abnormal results are displayed) Labs Reviewed  BLOOD GAS, ARTERIAL -  Abnormal; Notable for the following components:      Result Value   pH, Arterial 7.073 (*)    pCO2 arterial 99.5 (*)    pO2, Arterial 64.2 (*)    All other components within normal limits  CBC WITH DIFFERENTIAL/PLATELET - Abnormal; Notable for the following components:   WBC 11.7 (*)    Hemoglobin 10.9 (*)    HCT 35.7 (*)    MCH 24.5 (*)    Lymphs Abs 4.4 (*)    All other components within normal limits  I-STAT CHEM 8, ED - Abnormal; Notable for the following components:   Sodium 134 (*)    BUN 25 (*)    Glucose, Bld 201 (*)    Calcium, Ion 1.04 (*)    Hemoglobin 12.2 (*)  HCT 36.0 (*)    All other components within normal limits  TRIGLYCERIDES  COMPREHENSIVE METABOLIC PANEL  URINALYSIS, ROUTINE W REFLEX MICROSCOPIC  I-STAT TROPONIN, ED  I-STAT ARTERIAL BLOOD GAS, ED    EKG EKG Interpretation  Date/Time:  Tuesday December 18 2017 20:01:25 EDT Ventricular Rate:  94 PR Interval:    QRS Duration: 133 QT Interval:  382 QTC Calculation: 478 R Axis:   101 Text Interpretation:  Sinus rhythm Prolonged PR interval Nonspecific intraventricular conduction delay Confirmed by Dene Gentry [phone]) on 12/18/2017 8:29:13 PM   Radiology Dg Chest Portable 1 View  Result Date: 12/18/2017 CLINICAL DATA:  77 year old male with a history of endotracheal tube placement EXAM: PORTABLE CHEST 1 VIEW COMPARISON:  07/08/2017 FINDINGS: Cardiomediastinal silhouette unchanged in size and contour, partially obscured by defibrillator pads. Surgical changes of median sternotomy and CABG. Interval placement of endotracheal tube, with the tip appearing to terminate 5 cm above the carina. Interval placement of gastric tube projecting over the mediastinum and terminating out of the field of view. No significant interlobular septal thickening. No confluent airspace disease. No pneumothorax or pleural effusion. IMPRESSION: Interval placement of endotracheal tube, terminating suitably above the carina,  approximately 5.0 cm. Interval placement of gastric tube terminating out of the field of view. Surgical changes of median sternotomy and CABG. Defibrillator pads project over the mediastinum. Electronically Signed   By: Corrie Mckusick D.O.   On: 12/18/2017 20:37    Procedures Procedure Name: Intubation Date/Time: 12/18/2017 9:12 PM Performed by: Valarie Merino, MD Pre-anesthesia Checklist: Patient identified, Emergency Drugs available, Suction available, Patient being monitored and Timeout performed Oxygen Delivery Method: Ambu bag Preoxygenation: Pre-oxygenation with 100% oxygen Induction Type: IV induction Ventilation: Nasal airway inserted- appropriate to patient size Laryngoscope Size: Mac and 4 Grade View: Grade I Tube size: 7.5 mm Number of attempts: 1 Placement Confirmation: ETT inserted through vocal cords under direct vision,  Positive ETCO2,  CO2 detector and Breath sounds checked- equal and bilateral Tube secured with: ETT holder      (including critical care time) CRITICAL CARE Performed by: Valarie Merino   Total critical care time: 45 minutes  Critical care time was exclusive of separately billable procedures and treating other patients.  Critical care was necessary to treat or prevent imminent or life-threatening deterioration.  Critical care was time spent personally by me on the following activities: development of treatment plan with patient and/or surrogate as well as nursing, discussions with consultants, evaluation of patient's response to treatment, examination of patient, obtaining history from patient or surrogate, ordering and performing treatments and interventions, ordering and review of laboratory studies, ordering and review of radiographic studies, pulse oximetry and re-evaluation of patient's condition.    Medications Ordered in ED Medications  propofol (DIPRIVAN) 1000 MG/100ML infusion (5 mcg/kg/min  68.9 kg Intravenous New Bag/Given 12/18/17  2023)  sodium chloride 0.9 % bolus 500 mL (500 mLs Intravenous New Bag/Given 12/18/17 2036)  methylPREDNISolone sodium succinate (SOLU-MEDROL) 125 mg/2 mL injection 125 mg (has no administration in time range)  LORazepam (ATIVAN) 2 MG/ML injection (2 mg  Given 12/18/17 2000)  etomidate (AMIDATE) injection (20 mg Intravenous Given 12/18/17 2013)  HYDROmorphone (DILAUDID) injection 1 mg (1 mg Intravenous Given 12/18/17 2021)     Initial Impression / Assessment and Plan / ED Course  I have reviewed the triage vital signs and the nursing notes.  Pertinent labs & imaging results that were available during my care of the patient were reviewed by me  and considered in my medical decision making (see chart for details).     2115 Crit Care service aware of case - Dr. Vaughan Browner.    MDM Screen complete  Patient presenting with alteration of mental status accompanied with hypercarbia and hypoxia.  Suspect underlying cause of decreased mental status is secondary to hypercarbia.  Patient intubated on arrival.  Patient will be admitted to the ICU for further workup and treatment.   Final Clinical Impressions(s) / ED Diagnoses   Final diagnoses:  Acute respiratory failure with hypoxia and hypercapnia [facility])    ED Discharge Orders    None       Valarie Merino, MD 12/18/17 2116

## 2017-12-18 NOTE — Progress Notes (Signed)
Patient was transported to CT and back to room E59 with no complications.

## 2017-12-18 NOTE — Procedures (Signed)
Intubation Procedure Note NDREW CREASON [id number] 1941-03-09  Procedure: Intubation Indications: Respiratory insufficiency  Procedure Details Consent: Unable to obtain consent because of altered level of consciousness. Time Out: Verified patient identification, verified procedure, site/side was marked, verified correct patient position, special equipment/implants available, medications/allergies/relevent history reviewed, required imaging and test results available.  Performed  Maximum sterile technique was used including gloves and hand hygiene.  MAC and 4    Evaluation Hemodynamic Status: BP stable throughout; O2 sats: transiently fell during during procedure Patient's Current Condition: stable Complications: No apparent complications Patient did tolerate procedure well. Chest X-ray ordered to verify placement.  CXR: pending.  EDP intubated patient without any complications.   Vivien Rossetti 12/18/2017

## 2017-12-18 NOTE — ED Notes (Signed)
Paged [facility] to [employer], RN

## 2017-12-18 NOTE — ED Notes (Signed)
First set of blood cultures collected and rainbow by phleb kmat.

## 2017-12-18 NOTE — ED Triage Notes (Signed)
Pt from home via [facility]. Per family, pt went unresponsive and had a seizure. Family denies that he hit his head. Pt was unresponsive on arrival and being bagged by EMS on arrival. EDP @ bedside.

## 2017-12-18 NOTE — ED Notes (Addendum)
Attempted in and out catheter and pt bladder empty, no output.

## 2017-12-18 NOTE — Consult Note (Signed)
Neurology Consultation Reason for Consult: Seizures Referring Physician: Hammonds, K  CC: Seizures  History is obtained from: Patient   HPI: Danny Krause is a 77 y.o. male with a history of previous seizure which was prolonged and witnessed by EMS.  With his initial seizure, there is description of right gaze preference, but today it was described that he had a left gaze deviation.  He was started on Keppra and has been compliant with his medication.  Today, he was in his normal state of health until he began perseverating on a single word.  He subsequently had witnessed generalized shaking and stiffness.  On arrival, he had stiffness of the left arm and leg as well as left gaze deviation.  He was not protecting his airway and therefore was intubated and started on propofol.  He has since had cessation of eye deviation and stiffness.  ROS: Unable to obtain due to altered mental status.   Past Medical History:  Diagnosis Date  . Anginal pain ([place])    with exertion  . Coronary artery disease   . Critical aortic valve stenosis   . Dyspnea    with exertion  . Murmur   . Seizure ([place])   . Syncope      Family History  Problem Relation Age of Onset  . Hernia Father   . Stroke Brother      Social History:  reports that he quit smoking about 5 months ago. His smoking use included cigarettes. He has a 128.00 pack-year smoking history. He has never used smokeless tobacco. He reports that he does not drink alcohol or use drugs.   Exam: Current vital signs: BP (!) 188/107   Pulse 64   Temp (!) 97.2 F (36.2 C) (Rectal)   Resp (!) 21   Ht 5\' 7"  (1.702 m)   Wt 68.9 kg (152 lb)   SpO2 100%   BMI 23.81 kg/m  Vital signs in last 24 hours: Temp:  [97.2 F (36.2 C)] 97.2 F (36.2 C) (04/16 1957) Pulse Rate:  [63-103] 64 (04/16 2230) Resp:  [20-22] 21 (04/16 2230) BP: (133-212)/(78-107) 188/107 (04/16 2230) SpO2:  [92 %-100 %] 100 % (04/16 2235) FiO2 (%):  [60 %-100 %] 60 %  (04/16 2238) Weight:  [68.9 kg (152 lb)] 68.9 kg (152 lb) (04/16 1959)   Physical Exam  Constitutional: Appears well-developed and well-nourished.  Psych: Affect appropriate to situation Eyes: No scleral injection HENT: No OP obstrucion Head: Normocephalic.  Cardiovascular: Normal rate and regular rhythm.  Respiratory: Effort normal, non-labored breathing GI: Soft.  No distension. There is no tenderness.  Skin: WDI  Neuro: Mental Status: Patient is obtunded, but with vigorous noxious stimulation, he does open his eyes and appears to fixate, does not follow commands. Cranial Nerves: II: Appears to fixate but the not blink to threat pupils are equal, round, and reactive to light.   III,IV, VI: He fixates to the left, but I do not see him cross midline to the right V: VII: Blinks to eyelid stimulation bilaterally X: Cough intact Motor: He responds to noxious stimulation in all 4 extremities, appears to withdraw the right side slightly more briskly than the left Sensory: As above Cerebellar: Does not perform  I have reviewed labs in epic and the results pertinent to this consultation are: CMP-borderline creatinine, elevated glucose, mild hyponatremia Magnesium 2.1  I have reviewed the images obtained: CT head-unremarkable  Impression: 77 year old male with what I suspect is second seizure presenting a status  epilepticus.  He has not return to baseline, is not following commands and does not look to the right, so therefore I do feel that a stat EEG would be prudent.  My suspicion, however, is that he is postictal currently.  Recommendations: 1) stat EEG 2) increase Keppra to 1 g twice daily with additional 1 g dose now 3) neurology will continue to follow 4) assess for causes of lower seizure threshold such as UTI, UDS 5) neurology will continue to follow   Roland Rack, MD [facility] [phone]  If 7pm- 7am, please page neurology on call as listed  in [place].

## 2017-12-18 NOTE — Progress Notes (Signed)
   12/18/17 2000  Clinical Encounter Type  Visited With Patient and family together  Visit Type Initial  Referral From Nurse  Consult/Referral To Chaplain  Spiritual Encounters  Spiritual Needs Emotional  Stress Factors  Patient Stress Factors Exhausted  Family Stress Factors Exhausted    Pt was being intubated when I arrived on-site. A number of medical staff on-site helping Pt. One family member, son was on site. Chaplain had a brief discussion with [name] who seemingly was in great emotional distress. Chaplain requested pt's son to go to consult room but he declined and requested to be left alone. Chaplain provided emotional support through reflective listening and compassionate presence.

## 2017-12-19 ENCOUNTER — Inpatient Hospital Stay (HOSPITAL_COMMUNITY): Payer: Medicare Other

## 2017-12-19 DIAGNOSIS — J9602 Acute respiratory failure with hypercapnia: Secondary | ICD-10-CM

## 2017-12-19 DIAGNOSIS — R569 Unspecified convulsions: Secondary | ICD-10-CM

## 2017-12-19 DIAGNOSIS — R55 Syncope and collapse: Secondary | ICD-10-CM

## 2017-12-19 DIAGNOSIS — J9601 Acute respiratory failure with hypoxia: Secondary | ICD-10-CM

## 2017-12-19 DIAGNOSIS — G9340 Encephalopathy, unspecified: Secondary | ICD-10-CM

## 2017-12-19 LAB — BLOOD GAS, ARTERIAL
ACID-BASE EXCESS: 0.8 mmol/L (ref 0.0–2.0)
Bicarbonate: 24.7 mmol/L (ref 20.0–28.0)
Drawn by: 404151
FIO2: 40
O2 SAT: 98.8 %
PEEP/CPAP: 5 cmH2O
PH ART: 7.426 (ref 7.350–7.450)
Patient temperature: 98.6
RATE: 28 resp/min
VT: 530 mL
pCO2 arterial: 38.2 mmHg (ref 32.0–48.0)
pO2, Arterial: 121 mmHg — ABNORMAL HIGH (ref 83.0–108.0)

## 2017-12-19 LAB — MRSA PCR SCREENING: MRSA by PCR: NEGATIVE

## 2017-12-19 LAB — URINALYSIS, ROUTINE W REFLEX MICROSCOPIC
BILIRUBIN URINE: NEGATIVE
GLUCOSE, UA: NEGATIVE mg/dL
Hgb urine dipstick: NEGATIVE
Ketones, ur: 5 mg/dL — AB
Leukocytes, UA: NEGATIVE
NITRITE: NEGATIVE
PH: 5 (ref 5.0–8.0)
Protein, ur: NEGATIVE mg/dL
SPECIFIC GRAVITY, URINE: 1.021 (ref 1.005–1.030)

## 2017-12-19 LAB — GLUCOSE, CAPILLARY
GLUCOSE-CAPILLARY: 95 mg/dL (ref 65–99)
GLUCOSE-CAPILLARY: 103 mg/dL — AB (ref 65–99)
Glucose-Capillary: 105 mg/dL — ABNORMAL HIGH (ref 65–99)
Glucose-Capillary: 107 mg/dL — ABNORMAL HIGH (ref 65–99)
Glucose-Capillary: 101 mg/dL — ABNORMAL HIGH (ref 65–99)

## 2017-12-19 LAB — CBC
HCT: 31.9 % — ABNORMAL LOW (ref 39.0–52.0)
Hemoglobin: 9.7 g/dL — ABNORMAL LOW (ref 13.0–17.0)
MCH: 24 pg — ABNORMAL LOW (ref 26.0–34.0)
MCHC: 30.4 g/dL (ref 30.0–36.0)
MCV: 78.8 fL (ref 78.0–100.0)
Platelets: 221 10*3/uL (ref 150–400)
RBC: 4.05 MIL/uL — AB (ref 4.22–5.81)
RDW: 14.7 % (ref 11.5–15.5)
WBC: 11.1 10*3/uL — AB (ref 4.0–10.5)

## 2017-12-19 LAB — RAPID URINE DRUG SCREEN, HOSP PERFORMED
AMPHETAMINES: NOT DETECTED
BENZODIAZEPINES: NOT DETECTED
Barbiturates: NOT DETECTED
Cocaine: NOT DETECTED
Opiates: NOT DETECTED
Tetrahydrocannabinol: NOT DETECTED

## 2017-12-19 LAB — BASIC METABOLIC PANEL
ANION GAP: 13 (ref 5–15)
BUN: 20 mg/dL (ref 6–20)
CO2: 21 mmol/L — ABNORMAL LOW (ref 22–32)
Calcium: 8.7 mg/dL — ABNORMAL LOW (ref 8.9–10.3)
Chloride: 100 mmol/L — ABNORMAL LOW (ref 101–111)
Creatinine, Ser: 1.08 mg/dL (ref 0.61–1.24)
Glucose, Bld: 133 mg/dL — ABNORMAL HIGH (ref 65–99)
POTASSIUM: 3.6 mmol/L (ref 3.5–5.1)
SODIUM: 134 mmol/L — AB (ref 135–145)

## 2017-12-19 LAB — ECHOCARDIOGRAM COMPLETE
Height: 67 in
WEIGHTICAEL: 2432 [oz_av]

## 2017-12-19 LAB — CBG MONITORING, ED: Glucose-Capillary: 135 mg/dL — ABNORMAL HIGH (ref 65–99)

## 2017-12-19 LAB — PROCALCITONIN: Procalcitonin: <0.1 ng/mL

## 2017-12-19 LAB — TROPONIN I

## 2017-12-19 LAB — MAGNESIUM: Magnesium: 1.9 mg/dL (ref 1.7–2.4)

## 2017-12-19 LAB — PHOSPHORUS: PHOSPHORUS: 3.2 mg/dL (ref 2.5–4.6)

## 2017-12-19 MED ORDER — CHLORHEXIDINE GLUCONATE 0.12% ORAL RINSE (MEDLINE KIT): 15.0000 mL | Freq: Two times a day (BID) | OROMUCOSAL | Status: DC

## 2017-12-19 MED ORDER — ORAL CARE MOUTH RINSE
15.0000 mL | Freq: Two times a day (BID) | OROMUCOSAL | Status: DC
  Administered 2017-12-19: 15 mL via OROMUCOSAL

## 2017-12-19 MED ORDER — CHLORHEXIDINE GLUCONATE 0.12 % MT SOLN: 15.0000 mL | Freq: Two times a day (BID) | OROMUCOSAL | Status: DC

## 2017-12-19 MED ORDER — DEXMEDETOMIDINE HCL IN NACL 200 MCG/50ML IV SOLN
0.4000 ug/kg/h | INTRAVENOUS | Status: DC
  Filled 2017-12-19: qty 50

## 2017-12-19 MED ORDER — ORAL CARE MOUTH RINSE: 15.0000 mL | Freq: Two times a day (BID) | OROMUCOSAL | Status: DC

## 2017-12-19 MED ORDER — ORAL CARE MOUTH RINSE
15.0000 mL | OROMUCOSAL | Status: DC
  Administered 2017-12-19 (×3): 15 mL via OROMUCOSAL

## 2017-12-19 MED ORDER — CHLORHEXIDINE GLUCONATE 0.12 % MT SOLN
15.0000 mL | Freq: Two times a day (BID) | OROMUCOSAL | Status: DC
  Administered 2017-12-19: 15 mL via OROMUCOSAL

## 2017-12-19 NOTE — Procedures (Signed)
Extubation Procedure Note  Patient Details:   Name: Danny Krause DOB: [birth year] MRN: [id number]       Evaluation  O2 sats: stable throughout Complications: No apparent complications Patient did tolerate procedure well. Bilateral Breath Sounds: Diminished, Clear   Pt extubated per MD order.  Pt had + cuff leak. Placed on 2L [place], no distress noted.  Pt has strong cough and able to voice  Ciro Backer 12/19/2017, 1:35 PM

## 2017-12-19 NOTE — Progress Notes (Addendum)
PULMONARY  / CRITICAL CARE MEDICINE  Name: Danny Krause MRN: [id number] DOB: October 18, 1940    LOS: 3  REFERRING MD :  ED, Dene Gentry   CHIEF COMPLAINT:  Acute hypoxic and hypercapneic respiratory failure   BRIEF PATIENT DESCRIPTION:  77 yo with history of seizure disorder on Keppra and compliant, COPD on home O2, CAD s/p CBAG, and severe AS s/p AVR, who presented with acute encephalopathy thought to be secondary to seizure. He was unable to protect his airway, developed acute hypoxic and hypercapneic respiratory failure and was intubated in the ED and admitted to the ICU.   LINES / TUBES: 4/16 PIV RUE and LUE and foley   CULTURES: None   ANTIBIOTICS: None   SIGNIFICANT EVENTS:  4/16> Admitted to ICU and intubated   INTERVAL HISTORY: Intubated and sedated. Does not arouse to voice or follows commands.   VITAL SIGNS: Temp:  [97.2 F (36.2 C)-97.8 F (36.6 C)] 97.8 F (36.6 C) (04/17 0833) Pulse Rate:  [61-103] 71 (04/17 0800) Resp:  [15-28] 15 (04/17 0800) BP: (103-212)/(66-107) 124/68 (04/17 0800) SpO2:  [92 %-100 %] 100 % (04/17 0800) FiO2 (%):  [40 %-100 %] 40 % (04/17 0800) Weight:  [152 lb (68.9 kg)] 152 lb (68.9 kg) (04/16 1959) HEMODYNAMICS:   VENTILATOR SETTINGS: Vent Mode: PRVC FiO2 (%):  [40 %-100 %] 40 % Set Rate:  [22 bmp-28 bmp] 28 bmp Vt Set:  [530 mL] 530 mL PEEP:  [5 cmH20] 5 cmH20 Plateau Pressure:  [14 cmH20-18 cmH20] 17 cmH20 INTAKE / OUTPUT: Intake/Output      04/16 0701 - 04/17 0700 04/17 0701 - 04/18 0700   I.V. (mL/kg)  92.1 (1.3)   IV Piggyback 650 100   Total Intake(mL/kg) 650 (9.4) 192.1 (2.8)   Urine (mL/kg/hr)  100 (0.7)   Total Output  100   Net +650 +92.1          PHYSICAL EXAMINATION: General: Sedated,  Appears comfortable in bed Neuro:  Intubated and sedated, does not arouse to verbal stimuli, does not follow commands  HEENT:  NCAT, pinpoint pupils  Cardiovascular:  RRR, nl S1/S2, no mrg , no JVD  Lungs:  CTAB, no wheezes  or crackles, mechanical breath sounds  Abdomen:  Soft, NTND, decreased bowel sounds  Musculoskeletal:  No deformities, no LE edema  Skin:  No rashes or lesions noted    LABS: Cbc Recent Labs  Lab 12/18/17 2002 12/18/17 2032 12/19/17 0100  WBC 11.7*  --  11.1*  HGB 10.9* 12.2* 9.7*  HCT 35.7* 36.0* 31.9*  PLT 283  --  221    Chemistry  Recent Labs  Lab 12/18/17 2002 12/18/17 2032 12/18/17 2132 12/19/17 0100  NA 132* 134*  --  134*  K 4.1 4.2  --  3.6  CL 98* 101  --  100*  CO2 23  --   --  21*  BUN 24* 25*  --  20  CREATININE 1.26* 1.20  --  1.08  CALCIUM 8.9  --   --  8.7*  MG  --   --  2.1 1.9  PHOS  --   --  4.7* 3.2  GLUCOSE 201* 201*  --  133*    Liver fxn Recent Labs  Lab 12/18/17 2002  AST 21  ALT 12*  ALKPHOS 71  BILITOT 0.7  PROT 7.6  ALBUMIN 4.1   coags No results for input(s): APTT, INR in the last 168 hours. Sepsis markers Recent Labs  Lab  12/18/17 2132  PROCALCITON <0.10   Cardiac markers Recent Labs  Lab 12/18/17 2132 12/18/17 2155 12/19/17 0100  CKTOTAL  --  51  --   TROPONINI <0.03  --  <0.03   BNP No results for input(s): PROBNP in the last 168 hours. ABG Recent Labs  Lab 12/18/17 2032 12/18/17 2119 12/18/17 2300 12/19/17 0435  PHART  --  7.253* 7.268* 7.426  PCO2ART  --  60.8* 57.0* 38.2  PO2ART  --  455.0* 236.0* 121*  HCO3  --  26.9 26.1 24.7  TCO2 25 29 28   --     CBG trend Recent Labs  Lab 12/19/17 0710  GLUCAP 135*    IMAGING: CXR without acute findings, ETT appropriate   ECG: no new to review   DIAGNOSES: Active Problems:   Acute respiratory failure with hypoxia and hypercapnia (HCC)   Acute respiratory failure ([facility])   ASSESSMENT / PLAN:  PULMONARY A: Acute hypoxic and hypercapnic respiratory failure - improving, 7.4/38.2/121/24.7 COPD on home oxygen  PLAN:   - Continue Brovana and Pulmicort - Scheduled Duonebs q6h  - Intubated, vent settings: FiO2 40, PEEP 5, RR 28, TV  530  CARDIOVASCULAR A: 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 PCT < 0.10  ENDOCRINE A:  No active issues   PLAN:   - SSI with CBG monitoring  - CBG goal < 180  NEUROLOGIC A:   Acute encephalopathy thought to be 2/2 seizure activity   PLAN:   - Neurology following. Spoke with Dr. Rory Percy this AM. EEG without ongoing seizure activity and head CT negative. MRI brain ordered. Plan to wean propofol and wean vent support with plan for extubation later today if patient continues to do well.   - Continue Keppra  - STAT EEG with no seizure activity  - UA and UDS negative  - Head CT negative  - Seizure precautions  - On propofol with Fentanyl and Versed PRN    Welford Roche, MD  Internal Medicine PGY-1  P [phone] 12/19/2017, 9:06 AM

## 2017-12-19 NOTE — Progress Notes (Signed)
[facility] Progress Note Patient Name: Danny Krause DOB: 12/09/1940 MRN: [id number]   Date of Service  12/19/2017  HPI/Events of Note  ABG 7.27/57/236/100%  eICU Interventions  Reduce Fio2 Increase RR from 22 to 28     Intervention Category Minor Interventions: Other:  Talayia Hjort 12/19/2017, 12:37 AM

## 2017-12-19 NOTE — Progress Notes (Signed)
Neurology Progress Note   S:// Seen and examined earlier in the morning.  Patient seen around 9 AM. No acute changes.  Currently propofol.  O:// Current vital signs: BP (!) 161/75   Pulse 72   Temp 97.8 F (36.6 C) (Oral)   Resp (!) 24   Ht '5\' 7"'$  (1.702 m)   Wt 68.9 kg (152 lb)   SpO2 96%   BMI 23.81 kg/m  Vital signs in last 24 hours: Temp:  [97.2 F (36.2 C)-97.8 F (36.6 C)] 97.8 F (36.6 C) (04/17 9563) Pulse Rate:  [61-103] 72 (04/17 1400) Resp:  [15-28] 24 (04/17 1400) BP: (94-212)/(64-107) 161/75 (04/17 1400) SpO2:  [92 %-100 %] 96 % (04/17 1400) FiO2 (%):  [40 %-100 %] 40 % (04/17 1315) Weight:  [68.9 kg (152 lb)] 68.9 kg (152 lb) (04/16 1959) General: Sedated intubated H ENT: Normocephalic atraumatic Cardiovascular: S1-S2 heard regular rate rhythm Neurological exam Mental status: Sedated intubated.  No spontaneous movement. Opens eyes to noxious stimulus but does not follow commands. Cranial nerves: Pupils equal round reactive light, does not blink to threat, no gaze preference and eyes midline, ocular swellings present, difficult to ascertain facial symmetry. Motor exam: Response to noxious stim in all 4 extremities with mild withdrawal. Sensory: As above Cerebellar: Does not perform  Medications  Current Facility-Administered Medications:  .  0.9 %  sodium chloride infusion, 250 mL, Intravenous, PRN, Chundi, Vahini, MD, Last Rate: 10 mL/hr at 12/19/17 1200 .  arformoterol (BROVANA) nebulizer solution 15 mcg, 15 mcg, Nebulization, BID, Hammonds, Sharyn Blitz, MD, 15 mcg at 12/19/17 0719 .  budesonide (PULMICORT) nebulizer solution 0.5 mg, 0.5 mg, Nebulization, BID, Hammonds, Sharyn Blitz, MD, 0.5 mg at 12/19/17 0718 .  chlorhexidine (PERIDEX) 0.12 % solution 15 mL, 15 mL, Mouth Rinse, BID, Ollis, Brandi L, NP .  chlorhexidine gluconate (MEDLINE KIT) (PERIDEX) 0.12 % solution 15 mL, 15 mL, Mouth Rinse, BID, Byrum, Rose Fillers, MD .  dexmedetomidine (PRECEDEX) 200  MCG/50ML (4 mcg/mL) infusion, 0.4-1.2 mcg/kg/hr, Intravenous, Titrated, Welford Roche, MD, Stopped at 12/19/17 1023 .  docusate (COLACE) 50 MG/5ML liquid 100 mg, 100 mg, Per Tube, BID PRN, Hammonds, Sharyn Blitz, MD .  famotidine (PEPCID) IVPB 20 mg premix, 20 mg, Intravenous, Q12H, Chundi, Verne Spurr, MD, Stopped at 12/19/17 1215 .  fentaNYL (SUBLIMAZE) injection 50 mcg, 50 mcg, Intravenous, Q15 min PRN, Hammonds, Sharyn Blitz, MD, 50 mcg at 12/18/17 2248 .  fentaNYL (SUBLIMAZE) injection 50 mcg, 50 mcg, Intravenous, Q2H PRN, Hammonds, Sharyn Blitz, MD, 50 mcg at 12/19/17 0529 .  heparin injection 5,000 Units, 5,000 Units, Subcutaneous, Q8H, Chundi, Vahini, MD, 5,000 Units at 12/19/17 1400 .  insulin aspart (novoLOG) injection 1-3 Units, 1-3 Units, Subcutaneous, Q4H, Hammonds, Sharyn Blitz, MD, 1 Units at 12/19/17 0500 .  ipratropium-albuterol (DUONEB) 0.5-2.5 (3) MG/3ML nebulizer solution 3 mL, 3 mL, Nebulization, Q6H, Hammonds, Sharyn Blitz, MD, 3 mL at 12/19/17 0719 .  levETIRAcetam (KEPPRA) IVPB 1000 mg/100 mL premix, 1,000 mg, Intravenous, Q12H, Greta Doom, MD, Stopped at 12/19/17 0715 .  MEDLINE mouth rinse, 15 mL, Mouth Rinse, 10 times per day, Collene Gobble, MD, 15 mL at 12/19/17 1403 .  MEDLINE mouth rinse, 15 mL, Mouth Rinse, q12n4p, Ollis, Brandi L, NP .  midazolam (VERSED) injection 1 mg, 1 mg, Intravenous, Q15 min PRN, Hammonds, Sharyn Blitz, MD .  midazolam (VERSED) injection 1 mg, 1 mg, Intravenous, Q2H PRN, Hammonds, Sharyn Blitz, MD, 1 mg at 12/19/17 0830 .  propofol (DIPRIVAN) 1000 MG/100ML infusion,  5-80 mcg/kg/min, Intravenous, Titrated, Valarie Merino, MD, Stopped at 12/19/17 1240 Labs CBC    Component Value Date/Time   WBC 11.1 (H) 12/19/2017 0100   RBC 4.05 (L) 12/19/2017 0100   HGB 9.7 (L) 12/19/2017 0100   HGB 9.6 (L) 07/19/2017 1043   HCT 31.9 (L) 12/19/2017 0100   HCT 31.2 (L) 07/19/2017 1043   PLT 221 12/19/2017 0100   PLT 556 (H) 07/19/2017 1043   MCV  78.8 12/19/2017 0100   MCV 83 07/19/2017 1043   MCH 24.0 (L) 12/19/2017 0100   MCHC 30.4 12/19/2017 0100   RDW 14.7 12/19/2017 0100   RDW 15.3 07/19/2017 1043   LYMPHSABS 4.4 (H) 12/18/2017 2002   MONOABS 0.8 12/18/2017 2002   EOSABS 0.7 12/18/2017 2002   BASOSABS 0.0 12/18/2017 2002    CMP     Component Value Date/Time   NA 134 (L) 12/19/2017 0100   NA 139 07/19/2017 1043   K 3.6 12/19/2017 0100   CL 100 (L) 12/19/2017 0100   CO2 21 (L) 12/19/2017 0100   GLUCOSE 133 (H) 12/19/2017 0100   BUN 20 12/19/2017 0100   BUN 18 07/19/2017 1043   CREATININE 1.08 12/19/2017 0100   CALCIUM 8.7 (L) 12/19/2017 0100   PROT 7.6 12/18/2017 2002   ALBUMIN 4.1 12/18/2017 2002   AST 21 12/18/2017 2002   ALT 12 (L) 12/18/2017 2002   ALKPHOS 71 12/18/2017 2002   BILITOT 0.7 12/18/2017 2002   GFRNONAA >60 12/19/2017 0100   GFRAA >60 12/19/2017 0100   UA negative, UDS negative, CXR not impressive for pna  Imaging I have reviewed images in epic and the results pertinent to this consultation are: CT-scan of the brain-unremarkable  Assessment: 77 year old man with multiple seizures and status epilepticus, in a patient with known history of seizures. He had to be intubated as he was not protecting his airway and clinically appeared to be in status epilepticus. Stat EEG done overnight did not reveal ongoing seizure activity. On my examination, does not look like he is in status. I spoke with the primary team resident and suggested lowering sedation and attempt extubation.  Impression: Breakthrough seizure and status epilepticus in a patient with known seizures-resolved Respiratory failure secondary to seizures Evaluate for stroke or underlying structural lesion in the brain  Recommendations: Continue Keppra 1 g twice daily Lower Propofol and continue clinical assessments and appropriateness for extubation. Initial workup for infectious causes unremarkable. Continue frequent  neurochecks MRI brain to evaluate for structural lesion including cortical strokes. We will follow the patient with you.   -- Amie Portland, MD [facility] Pager: [phone] If 7pm to 7am, please call on call as listed on [place].  CRITICAL CARE ATTESTATION This patient is critically ill and at significant risk of neurological worsening, death and care requires constant monitoring of vital signs, hemodynamics,respiratory and cardiac monitoring. I spent 35  minutes of neurocritical care time performing neurological assessment, discussion with family, other specialists and medical decision making of high complexityin the care of  this patient.

## 2017-12-19 NOTE — Progress Notes (Signed)
  Echocardiogram 2D Echocardiogram has been performed.  Raffaela Ladley G Malerie Eakins 12/19/2017, 11:47 AM

## 2017-12-19 NOTE — Progress Notes (Signed)
Bedside EEG completed, results pending. 

## 2017-12-19 NOTE — Procedures (Signed)
History: 77 year old male with persistent confusion status post seizure activity  Sedation: Propofol  Technique: This is a 21 channel routine scalp EEG performed at the bedside with bipolar and monopolar montages arranged in accordance to the international 10/20 system of electrode placement. One channel was dedicated to EKG recording.    Background: The background consists of generalized irregular delta and theta activities intermixed with occasional spindle-like activity of 11-12 Hz.  Photic stimulation: Physiologic driving is not performed  EEG Abnormalities: 1) sedated EEG  Clinical Interpretation: This EEG is consistent with sedated state.   There was no seizure or seizure predisposition recorded on this study. Please note that a normal EEG does not preclude the possibility of epilepsy.   Roland Rack, MD [facility] [phone]  If 7pm- 7am, please page neurology on call as listed in [place].

## 2017-12-19 NOTE — Progress Notes (Signed)
[facility] PCCM   Brief Summary:  77 y/o M admitted 4/16 with acute encephalopathy thought related to seizure.  Found to have acute hypoxic / hypercapnic respiratory failure requiring intubation. Neurology consulted. EEG negative. Met criteria for extubation afternoon of 4/17 & successfully extubated.  MRI pending.    S:  Called to bedside by RN.  Pt alert / awake off propofol.  Following commands.  Son at bedside.  States his father has "snored for years and I think he has sleep apnea".  No acute events overnight.   O: Blood pressure (!) 161/75, pulse 72, temperature 97.8 F (36.6 C), temperature source Oral, resp. rate (!) 24, height '5\' 7"'$  (1.702 m), weight 152 lb (68.9 kg), SpO2 97 %.  General: elderly male in NAD, sitting up in bed on vent  HEENT: MM pink/moist, ETT  Neuro: opens eyes to voice, follows commands, MAE  CV: s1s2 rrr, no m/r/g,  Midline CABG scar, well healed.  PULM: even/non-labored on PSV, lungs bilaterally clear, diminished bases. Pulling volumes of 1.5L, rate 14.  GI: soft, non-tender, bsx4 active  Extremities: warm/dry, no edema  Skin: no rashes or lesions  Recent Labs  Lab 12/18/17 2002 12/18/17 2032 12/19/17 0100  HGB 10.9* 12.2* 9.7*  HCT 35.7* 36.0* 31.9*  WBC 11.7*  --  11.1*  PLT 283  --  221   Recent Labs  Lab 12/18/17 2002 12/18/17 2032 12/18/17 2132 12/19/17 0100  NA 132* 134*  --  134*  K 4.1 4.2  --  3.6  CL 98* 101  --  100*  CO2 23  --   --  21*  GLUCOSE 201* 201*  --  133*  BUN 24* 25*  --  20  CREATININE 1.26* 1.20  --  1.08  CALCIUM 8.9  --   --  8.7*  MG  --   --  2.1 1.9  PHOS  --   --  4.7* 3.2    A: Acute Hypoxic & Hypercapnic Respiratory Failure  Seizure Disorder Acute Encephalopathy  O2 Dependent COPD CAD s/p CABG  AS s/p AVR  P: Now extubation  O2 via [place] to support sats 88-95% Pulmonary hygiene - IS, mobilize OOB  Continue Brovana + Pulmicort Duoneb  MRI per Neuro, pending  Will observe in ICU overnight given  extubation.    See Dr. Isac Sarna note for full plan of care   To [facility] as of 4/18.    Noe Gens, NP-C [facility] Pgr: [phone] or if no answer [phone] 12/19/2017, 1:36 PM

## 2017-12-19 NOTE — Progress Notes (Signed)
Patient transported from ED to room 2M01 without any apparent complications.

## 2017-12-20 ENCOUNTER — Inpatient Hospital Stay (HOSPITAL_COMMUNITY): Payer: Medicare Other

## 2017-12-20 DIAGNOSIS — J449 Chronic obstructive pulmonary disease, unspecified: Secondary | ICD-10-CM

## 2017-12-20 DIAGNOSIS — J9611 Chronic respiratory failure with hypoxia: Secondary | ICD-10-CM

## 2017-12-20 LAB — BASIC METABOLIC PANEL
Anion gap: 10 (ref 5–15)
BUN: 11 mg/dL (ref 6–20)
CALCIUM: 8.6 mg/dL — AB (ref 8.9–10.3)
CHLORIDE: 103 mmol/L (ref 101–111)
CO2: 22 mmol/L (ref 22–32)
CREATININE: 0.95 mg/dL (ref 0.61–1.24)
GFR calc Af Amer: >60 mL/min (ref >60)
GLUCOSE: 102 mg/dL — AB (ref 65–99)
POTASSIUM: 3.4 mmol/L — AB (ref 3.5–5.1)
Sodium: 135 mmol/L (ref 135–145)

## 2017-12-20 LAB — GLUCOSE, CAPILLARY
GLUCOSE-CAPILLARY: 93 mg/dL (ref 65–99)
Glucose-Capillary: 91 mg/dL (ref 65–99)

## 2017-12-20 LAB — CBC
HCT: 31.3 % — ABNORMAL LOW (ref 39.0–52.0)
HEMOGLOBIN: 9.5 g/dL — AB (ref 13.0–17.0)
MCH: 24 pg — AB (ref 26.0–34.0)
MCHC: 30.4 g/dL (ref 30.0–36.0)
MCV: 79 fL (ref 78.0–100.0)
PLATELETS: 198 10*3/uL (ref 150–400)
RBC: 3.96 MIL/uL — AB (ref 4.22–5.81)
RDW: 14.9 % (ref 11.5–15.5)
WBC: 7.3 10*3/uL (ref 4.0–10.5)

## 2017-12-20 MED ORDER — LEVETIRACETAM 500 MG PO TABS: 1000.0000 mg | ORAL_TABLET | Freq: Two times a day (BID) | ORAL | 1 refills | Status: DC

## 2017-12-20 MED ORDER — MIDAZOLAM HCL 2 MG/2ML IJ SOLN: 2.0000 mg | Freq: Once | INTRAMUSCULAR | Status: DC

## 2017-12-20 MED ORDER — SUCCINYLCHOLINE CHLORIDE 20 MG/ML IJ SOLN
100.0000 mg | Freq: Once | INTRAMUSCULAR | Status: DC
Start: 2017-12-20 — End: 2017-12-20
  Filled 2017-12-20: qty 5

## 2017-12-20 MED ORDER — ETOMIDATE 2 MG/ML IV SOLN: 20.0000 mg | Freq: Once | INTRAVENOUS | Status: DC

## 2017-12-20 NOTE — Plan of Care (Signed)

## 2017-12-20 NOTE — Evaluation (Signed)
Physical Therapy Evaluation Patient Details Name: CARLOS HEBER MRN: [id number] DOB: September 12, 1940 Today's Date: 12/20/2017   History of Present Illness  Pt is a 77 y.o. male admitted 12/18/17 with multiple seizures and status epilepticus. Intub 4/16-17. Stat EEG did not reveal seizure activity. 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 PMH includes serizure, CAD, murmur.     Clinical Impression  Pt presents with an overall decrease in functional mobility secondary to above. PTA, pt indep and lives with wife available for 24/7 supervision. Today, pt amb 300' with no DME; supervision for balance. SpO2 >90% on RA. From a mobility perspective, feel pt is safe to return home with initial supervision from family. Educ on fall risk reduction and energy conservation. If pt remains admitted, will follow acutely.   Follow Up Recommendations No PT follow up;Supervision/Assistance - 24 hour    Equipment Recommendations  None recommended by PT    Recommendations for Other Services       Precautions / Restrictions Precautions Precautions: None Restrictions Weight Bearing Restrictions: No      Mobility  Bed Mobility Overal bed mobility: Independent                Transfers Overall transfer level: Independent                  Ambulation/Gait Ambulation/Gait assistance: Supervision Ambulation Distance (Feet): 300 Feet Assistive device: None Gait Pattern/deviations: Step-through pattern;Decreased stride length Gait velocity: Decreased   General Gait Details: Slow, controlled amb with no UE support; supervision for balance  Stairs            Wheelchair Mobility    Modified Rankin (Stroke Patients Only)       Balance Overall balance assessment: Mild deficits observed, not formally tested                                           Pertinent Vitals/Pain Pain Assessment: No/denies pain    Home  Living Family/patient expects to be discharged to:: Private residence Living Arrangements: Spouse/significant other Available Help at Discharge: Family;Available 24 hours/day Type of Home: House Home Access: Stairs to enter Entrance Stairs-Rails: None Entrance Stairs-Number of Steps: 1 Home Layout: One level Home Equipment: Walker - 2 wheels;Cane - single point;Shower seat;Shower seat - built in Additional Comments: Bathroom is entirely wheelchair accessible since wife has LE amputation    Prior Function Level of Independence: Independent               Hand Dominance        Extremity/Trunk Assessment   Upper Extremity Assessment Upper Extremity Assessment: Overall WFL for tasks assessed    Lower Extremity Assessment Lower Extremity Assessment: Overall WFL for tasks assessed       Communication   Communication: No difficulties  Cognition Arousal/Alertness: Awake/alert Behavior During Therapy: WFL for tasks assessed/performed Overall Cognitive Status: Within Functional Limits for tasks assessed                                        General Comments General comments (skin integrity, edema, etc.): SpO2 >90% on RA    Exercises     Assessment/Plan    PT Assessment Patient needs continued PT services  PT Problem List Decreased activity tolerance;Decreased  balance;Decreased mobility       PT Treatment Interventions DME instruction;Gait training;Stair training;Functional mobility training;Therapeutic activities;Therapeutic exercise;Balance training;Patient/family education    PT Goals (Current goals can be found in the Care Plan section)  Acute Rehab PT Goals Patient Stated Goal: Return home PT Goal Formulation: With patient Time For Goal Achievement: 01/03/18 Potential to Achieve Goals: Good    Frequency Min 3X/week   Barriers to discharge        Co-evaluation               AM-PAC PT "6 Clicks" Daily Activity  Outcome Measure  Difficulty turning over in bed (including adjusting bedclothes, sheets and blankets)?: None Difficulty moving from lying on back to sitting on the side of the bed? : None Difficulty sitting down on and standing up from a chair with arms (e.g., wheelchair, bedside commode, etc,.)?: None Help needed moving to and from a bed to chair (including a wheelchair)?: A Little Help needed walking in hospital room?: A Little Help needed climbing 3-5 steps with a railing? : A Little 6 Click Score: 21    End of Session Equipment Utilized During Treatment: Gait belt Activity Tolerance: Patient tolerated treatment well Patient left: in chair;with call bell/phone within reach Nurse Communication: Mobility status PT Visit Diagnosis: Other abnormalities of gait and mobility (R26.89)    Time: 9628-3662 PT Time Calculation (min) (ACUTE ONLY): 17 min   Charges:   PT Evaluation $PT Eval Moderate Complexity: 1 Mod     PT G Codes:       Mabeline Caras, PT, DPT Acute Rehab Services  Pager: [place] 12/20/2017, 12:35 PM

## 2017-12-20 NOTE — Discharge Summary (Signed)
Physician Discharge Summary  Danny Krause WUJ:[id number] DOB: 02-Jul-1941 DOA: 12/18/2017  PCP: Jani Gravel, MD  Admit date: 12/18/2017 Discharge date: 12/20/2017  Recommendations for Outpatient Follow-up:   Breakthrough seizure, known seizure disorder, associated acute encephalopathy. --Continue Keppra 1 g twice daily per neurology --Recommend MRI inpatient or outpatient per neurology --Outpatient neurology follow-up 4-6 weeks -- Seizure precautions including no driving until 6 months seizure-free per [facility].  Detailed seizure precautions below.  Per [facility] statutes, patients with seizures are not allowed to drive until they have been seizure-free for six months.  Use caution when using heavy equipment or power tools. Avoid working on ladders or at heights. Take showers instead of baths. Ensure the water temperature is not too high on the home water heater. Do not go swimming alone. Do not lock yourself in a room alone (i.e. bathroom). When caring for infants or small children, sit down when holding, feeding, or changing them to minimize risk of injury to the child in the event you have a seizure. Maintain good sleep hygiene. Avoid alcohol.  If patienthas another seizure, call 911 and bring them back to the ED if: A. The seizure lasts longer than 5 minutes.  B. The patient doesn't wake shortly after the seizure or has new problems such as difficulty seeing, speaking or moving following the seizure C. The patient was injured during the seizure D. The patient has a temperature over 102 F (39C) E. The patient vomited during the seizure and now is having trouble breathing   Follow-up Information    Kathrynn Ducking, MD. Schedule an appointment as soon as possible for a visit in 1 week(s).   Specialty:  Neurology Contact information: [address] [place] [address] [phone]            Discharge Diagnoses:   1. Breakthrough seizure, known seizure disorder, associated acute encephalopathy. 2. Acute hypercapnic, hypoxic respiratory failure secondary to breakthrough seizure. 3. COPD, chronic hypoxic respiratory failure  Discharge Condition: improved Disposition: home  Diet recommendation: heart healthy  Filed Weights   12/18/17 1959 12/20/17 0500  Weight: 68.9 kg (152 lb) 67.2 kg (148 lb 2.4 oz)    History of present illness:  77 year old man PMH COPD on home oxygen, seizure disorder on Keppra, who became unresponsive at home, family treated with CPR at home, EMS found patient near apneic.  In ED patient was obtunded, found to have hypercapnia with respiratory acidosis, intubated.  Hospital Course:  Patient was admitted by the critical care service, rapidly improved, was extubated.  Seen by neurology, underwent EEG which was negative for status.  In follow-up, neurology recommended discharge home, can complete MRI as outpatient.  Recommended increasing Keppra to 1 g twice daily.  Hospital course was uncomplicated.  Breakthrough seizure, known seizure disorder, associated acute encephalopathy. --Continue Keppra 1 g twice daily per neurology --Recommend MRI inpatient or outpatient per neurology --Outpatient neurology follow-up 4-6 weeks -- Seizure precautions including no driving until 6 months seizure-free per [facility].  Detailed seizure precautions below.  Per [facility] statutes, patients with seizures are not allowed to drive until they have been seizure-free for six months.  Use caution when using heavy equipment or power tools. Avoid working on ladders or at heights. Take showers instead of baths. Ensure the water temperature is not too high on the home water heater. Do not go swimming alone. Do not lock yourself in a room alone (i.e. bathroom).  When caring for infants or small children, sit down when holding, feeding, or changing them to minimize risk of injury  to the child in the event you have a seizure. Maintain good sleep hygiene. Avoid alcohol.  If patienthas another seizure, call 911 and bring them back to the ED if: A. The seizure lasts longer than 5 minutes.  B. The patient doesn't wake shortly after the seizure or has new problems such as difficulty seeing, speaking or moving following the seizure C. The patient was injured during the seizure D. The patient has a temperature over 102 F (39C) E. The patient vomited during the seizure and now is having trouble breathing  Acute hypercapnic, hypoxic respiratory failure secondary to breakthrough seizure. --Resolved.  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    Consultants:  PCCM admitted  Neurology   Procedures:  ETT 4/16 >> 4/17  4/17 EEG  EEG Abnormalities:1)sedated EEG  Clinical Interpretation: ThisEEG is consistent with sedated state.   There was no seizure or seizure predisposition recorded on this study. Please note that a normal EEG does not preclude the possibility of epilepsy.  Echo Study Conclusions  - Left ventricle: The cavity size was normal. Systolic function was normal. The estimated ejection fraction was in the range of 55% to 60%. Wall motion was normal; there were no regional wall motion abnormalities. Doppler parameters are consistent with abnormal left ventricular relaxation (grade 1 diastolic dysfunction). - Aortic valve: Trileaflet; mildly thickened, mildly calcified leaflets. - Mitral valve: There was mild regurgitation.  Today's assessment: S: Feels well, no complaints, no pain.  "I want to go home". O: Vitals:  Vitals:   12/20/17 0800 12/20/17 0900  BP: (!) 154/75 133/70  Pulse: 82 85  Resp: (!) 21 (!) 25  Temp:    SpO2: 98% 97%     Constitutional:  . Appears calm and comfortable Eyes:  . pupils and irises appear normal ENMT:  . grossly normal hearing  . Lips appear normal Respiratory:  . CTA bilaterally, no w/r/r.  . Respiratory effort normal Cardiovascular:  . RRR, no m/r/g . No LE extremity edema   Musculoskeletal:  . Moves all extremities Psychiatric:  . judgement and insight appear normal . Mental status o Mood, affect appropriate o Orientation to person, hospital, month, year    Discharge Instructions  Discharge Instructions    Diet - low sodium heart healthy   Complete by:  As directed    Discharge instructions   Complete by:  As directed    Call your physician or seek immediate medical attention for fall, seizure, confusion or worsening of condition. Per [facility] statutes, patients with seizures are not allowed to drive until they have been seizure-free for six months.  Use caution when using heavy equipment or power tools. Avoid working on ladders or at heights. Take showers instead of baths. Ensure the water temperature is not too high on the home water heater. Do not go swimming alone. Do not lock yourself in a room alone (i.e. bathroom). When caring for infants or small children, sit down when holding, feeding, or changing them to minimize risk of injury to the child in the event you have a seizure. Maintain good sleep hygiene. Avoid alcohol.  If patienthas another seizure, call 911 and bring them back to the ED if: A. The seizure lasts longer than 5 minutes.  B. The patient doesn't wake shortly after the seizure or has new problems such as difficulty seeing, speaking or moving following the seizure C. The patient was injured during the seizure D. The patient has a temperature over 102 F (39C) E. The patient vomited during the seizure and now is having trouble breathing   Increase activity slowly   Complete by:  As directed      Allergies as of 12/20/2017   No Known  Allergies     Medication List    TAKE these medications   acetaminophen 650 MG CR tablet Commonly known as:  TYLENOL Take 650 mg by mouth every 8 (eight) hours as needed for pain.   ASPIRIN 81 PO Take by mouth daily.   budesonide-formoterol 80-4.5 MCG/ACT inhaler Commonly known as:  SYMBICORT Inhale 2 puffs into the lungs 2 (two) times daily.   cyanocobalamin 1000 MCG tablet Take 2 tablets (2,000 mcg total) by mouth daily.   Ipratropium-Albuterol 20-100 MCG/ACT Aers respimat Commonly known as:  COMBIVENT Use 1 puff as needed every 4-6 hours.   levETIRAcetam 500 MG tablet Commonly known as:  KEPPRA Take 2 tablets (1,000 mg total) by mouth 2 (two) times daily. What changed:  how much to take   metoprolol tartrate 25 MG tablet Commonly known as:  LOPRESSOR TAKE 1/2 TABLET BY MOUTH TWICE A DAY   simvastatin 20 MG tablet Commonly known as:  ZOCOR Take 1 tablet (20 mg total) daily at 6 PM by mouth.      No Known Allergies  The results of significant diagnostics from this hospitalization (including imaging, microbiology, ancillary and laboratory) are listed below for reference.    Significant Diagnostic Studies: Ct Head Wo Contrast  Result Date: 12/18/2017 CLINICAL DATA:  Acute onset of altered level of consciousness. EXAM: CT HEAD WITHOUT CONTRAST TECHNIQUE: Contiguous axial images were obtained from the base of the skull through the vertex without intravenous contrast. COMPARISON:  CT of the head performed 03/13/2017, and MRI of the brain performed 03/14/2017 FINDINGS: Brain: No evidence of acute infarction, hemorrhage, hydrocephalus, extra-axial collection or mass lesion/mass effect. Periventricular matter change likely reflects small vessel ischemic microangiopathy. A small chronic infarct is noted at the right frontal lobe, with associated encephalomalacia. The posterior fossa, including the cerebellum, brainstem and fourth ventricle, is within normal limits. The third and  lateral ventricles, and basal ganglia are unremarkable in appearance. No mass effect or midline shift is seen. Vascular: No hyperdense vessel or unexpected calcification. Skull: There is no evidence of fracture; visualized osseous structures are unremarkable in appearance. Sinuses/Orbits: The orbits are within normal limits. The paranasal sinuses and mastoid air cells are well-aerated. Other: No significant soft tissue abnormalities are seen. IMPRESSION: 1. No acute intracranial pathology seen on CT. 2. 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 Electronically Signed   By: Garald Balding M.D.   On: 12/18/2017 21:26   Dg Chest Port 1 View  Result Date: 12/20/2017 CLINICAL DATA:  Acute respiratory failure EXAM: PORTABLE CHEST 1 VIEW COMPARISON:  12/19/2017 FINDINGS: Endotracheal tube and gastric catheter have been removed in the interval. The cardiac shadow is stable with postoperative change. The lungs are well aerated bilaterally. Some mild platelike atelectasis is noted in the right lung base new from the previous exam. No bony abnormality is seen. IMPRESSION: Mild right basilar atelectasis. Electronically Signed   By: Inez Catalina M.D.   On: 12/20/2017 09:01   Dg Chest Port 1  View  Result Date: 12/19/2017 CLINICAL DATA:  Intubation EXAM: PORTABLE CHEST 1 VIEW COMPARISON:  Yesterday FINDINGS: Endotracheal tube tip between the clavicular heads and carina. An orogastric tube and side-port reaches the stomach. Artifact from EKG leads and defibrillator pads. COPD changes on prior. Mildly low lung volumes compared to prior. There is no edema, consolidation, effusion, or pneumothorax. Normal heart size and stable mediastinal contours. Status post CABG. IMPRESSION: 1. Unremarkable positioning of endotracheal and orogastric tubes. 2. COPD and low lung volumes. Electronically Signed   By: Monte Fantasia M.D.   On: 12/19/2017 08:11   Dg  Chest Portable 1 View  Result Date: 12/18/2017 CLINICAL DATA:  77 year old male with a history of endotracheal tube placement EXAM: PORTABLE CHEST 1 VIEW COMPARISON:  07/08/2017 FINDINGS: Cardiomediastinal silhouette unchanged in size and contour, partially obscured by defibrillator pads. Surgical changes of median sternotomy and CABG. Interval placement of endotracheal tube, with the tip appearing to terminate 5 cm above the carina. Interval placement of gastric tube projecting over the mediastinum and terminating out of the field of view. No significant interlobular septal thickening. No confluent airspace disease. No pneumothorax or pleural effusion. IMPRESSION: Interval placement of endotracheal tube, terminating suitably above the carina, approximately 5.0 cm. Interval placement of gastric tube terminating out of the field of view. Surgical changes of median sternotomy and CABG. Defibrillator pads project over the mediastinum. Electronically Signed   By: Corrie Mckusick D.O.   On: 12/18/2017 20:37    Microbiology: Recent Results (from the past 240 hour(s))  MRSA PCR Screening     Status: None   Collection Time: 12/19/17  8:36 AM  Result Value Ref Range Status   MRSA by PCR NEGATIVE NEGATIVE Final    Comment:        The GeneXpert MRSA Assay (FDA approved for NASAL specimens only), is one component of a comprehensive MRSA colonization surveillance program. It is not intended to diagnose MRSA infection nor to guide or monitor treatment for MRSA infections. Performed at [facility], [place] [address], [place], [place] [address]      Labs: Basic Metabolic Panel: Recent Labs  Lab 12/18/17 2002 12/18/17 2032 12/18/17 2132 12/19/17 0100 12/20/17 0223  NA 132* 134*  --  134* 135  K 4.1 4.2  --  3.6 3.4*  CL 98* 101  --  100* 103  CO2 23  --   --  21* 22  GLUCOSE 201* 201*  --  133* 102*  BUN 24* 25*  --  20 11  CREATININE 1.26* 1.20  --  1.08 0.95  CALCIUM 8.9  --   --  8.7*  8.6*  MG  --   --  2.1 1.9  --   PHOS  --   --  4.7* 3.2  --    Liver Function Tests: Recent Labs  Lab 12/18/17 2002  AST 21  ALT 12*  ALKPHOS 71  BILITOT 0.7  PROT 7.6  ALBUMIN 4.1   CBC: Recent Labs  Lab 12/18/17 2002 12/18/17 2032 12/19/17 0100 12/20/17 0223  WBC 11.7*  --  11.1* 7.3  NEUTROABS 5.8  --   --   --   HGB 10.9* 12.2* 9.7* 9.5*  HCT 35.7* 36.0* 31.9* 31.3*  MCV 80.4  --  78.8 79.0  PLT 283  --  221 198   Cardiac Enzymes: Recent Labs  Lab 12/18/17 2132 12/18/17 2155 12/19/17 0100 12/19/17 1110  CKTOTAL  --  51  --   --  TROPONINI <0.03  --  <0.03 <0.03    CBG: Recent Labs  Lab 12/19/17 1459 12/19/17 1948 12/19/17 2325 12/20/17 0347 12/20/17 0716  GLUCAP 103* 107* 101* 91 93    Active Problems:   Acute respiratory failure with hypoxia and hypercapnia (HCC)   Acute respiratory failure (HCC)   Time coordinating discharge: 35 minutes  Signed:  Murray Hodgkins, MD [facility] 12/20/2017, 9:48 AM

## 2017-12-20 NOTE — Progress Notes (Signed)
Called pt home and spoke to daughter and wife, updated on pt and answered questions. Family is prepared for pt d/c home and son will be available to pick patient up around 12:00.

## 2017-12-20 NOTE — Progress Notes (Signed)
Neurology Progress Note   S:// Patient extubated yesterday.  Doing well. No complaints  O:// Current vital signs: BP 139/63   Pulse 77   Temp 98.4 F (36.9 C) (Oral)   Resp (!) 21   Ht 5\' 7"  (1.702 m)   Wt 67.2 kg (148 lb 2.4 oz)   SpO2 91%   BMI 23.20 kg/m  Vital signs in last 24 hours: Temp:  [97.8 F (36.6 C)-99.3 F (37.4 C)] 98.4 F (36.9 C) (04/18 0714) Pulse Rate:  [64-86] 77 (04/18 0500) Resp:  [14-29] 21 (04/18 0500) BP: (94-165)/(62-81) 139/63 (04/18 0500) SpO2:  [91 %-100 %] 91 % (04/18 0740) FiO2 (%):  [40 %] 40 % (04/17 1315) Weight:  [67.2 kg (148 lb 2.4 oz)] 67.2 kg (148 lb 2.4 oz) (04/18 0500) GENERAL: Awake, alert in NAD HEENT: - Normocephalic and atraumatic, dry mm, no LN++, no Thyromegally LUNGS -scattered rales with some wheezing.  Currently receiving breathing treatment. CV - S1S2 RRR, no m/r/g, equal pulses bilaterally. ABDOMEN - Soft, nontender, nondistended with normoactive BS Ext: warm, well perfused, intact peripheral pulses, no edema NEURO:  Mental Status: AA&Ox3  Language: speech is not dysarthric.  Naming, repetition, fluency, and comprehension intact. Cranial Nerves: PERRLEOMI, visual fields full, no facial asymmetry, facial sensation intact, hearing intact, tongue/uvula/soft palate midline, normal sternocleidomastoid and trapezius muscle strength. No evidence of tongue atrophy or fibrillations Motor: Metric 5/5 strength in all 4 extremities with normal tone normal range of motion. Sensation- Intact to light touch bilaterally Coordination: FTN intact bilaterally, no ataxia in BLE. Gait- deferred  Medications  Current Facility-Administered Medications:  .  0.9 %  sodium chloride infusion, 250 mL, Intravenous, PRN, Chundi, Vahini, MD, Last Rate: 10 mL/hr at 12/20/17 0500 .  arformoterol (BROVANA) nebulizer solution 15 mcg, 15 mcg, Nebulization, BID, Hammonds, Sharyn Blitz, MD, 15 mcg at 12/20/17 0737 .  budesonide (PULMICORT) nebulizer  solution 0.5 mg, 0.5 mg, Nebulization, BID, Hammonds, Sharyn Blitz, MD, 0.5 mg at 12/20/17 0737 .  chlorhexidine (PERIDEX) 0.12 % solution 15 mL, 15 mL, Mouth Rinse, BID, Hammonds, Sharyn Blitz, MD, 15 mL at 12/19/17 2108 .  dexmedetomidine (PRECEDEX) 200 MCG/50ML (4 mcg/mL) infusion, 0.4-1.2 mcg/kg/hr, Intravenous, Titrated, Welford Roche, MD, Stopped at 12/19/17 1023 .  docusate (COLACE) 50 MG/5ML liquid 100 mg, 100 mg, Per Tube, BID PRN, Hammonds, Sharyn Blitz, MD .  famotidine (PEPCID) IVPB 20 mg premix, 20 mg, Intravenous, Q12H, Lars Mage, MD, Stopped at 12/19/17 2138 .  fentaNYL (SUBLIMAZE) injection 50 mcg, 50 mcg, Intravenous, Q15 min PRN, Hammonds, Sharyn Blitz, MD, 50 mcg at 12/18/17 2248 .  fentaNYL (SUBLIMAZE) injection 50 mcg, 50 mcg, Intravenous, Q2H PRN, Hammonds, Sharyn Blitz, MD, 50 mcg at 12/19/17 0529 .  heparin injection 5,000 Units, 5,000 Units, Subcutaneous, Q8H, Chundi, Vahini, MD, 5,000 Units at 12/20/17 0522 .  insulin aspart (novoLOG) injection 1-3 Units, 1-3 Units, Subcutaneous, Q4H, Hammonds, Sharyn Blitz, MD, 1 Units at 12/19/17 0500 .  ipratropium-albuterol (DUONEB) 0.5-2.5 (3) MG/3ML nebulizer solution 3 mL, 3 mL, Nebulization, Q6H, Hammonds, Sharyn Blitz, MD, 3 mL at 12/20/17 0737 .  levETIRAcetam (KEPPRA) IVPB 1000 mg/100 mL premix, 1,000 mg, Intravenous, Q12H, Greta Doom, MD, Stopped at 12/20/17 [phone] .  MEDLINE mouth rinse, 15 mL, Mouth Rinse, q12n4p, Hammonds, Sharyn Blitz, MD .  midazolam (VERSED) injection 1 mg, 1 mg, Intravenous, Q15 min PRN, Hammonds, Sharyn Blitz, MD .  midazolam (VERSED) injection 1 mg, 1 mg, Intravenous, Q2H PRN, Hammonds, Sharyn Blitz, MD, 1 mg at 12/19/17  0830 .  propofol (DIPRIVAN) 1000 MG/100ML infusion, 5-80 mcg/kg/min, Intravenous, Titrated, Valarie Merino, MD, Stopped at 12/19/17 1240  Labs CBC    Component Value Date/Time   WBC 7.3 12/20/2017 0223   RBC 3.96 (L) 12/20/2017 0223   HGB 9.5 (L) 12/20/2017 0223   HGB 9.6  (L) 07/19/2017 1043   HCT 31.3 (L) 12/20/2017 0223   HCT 31.2 (L) 07/19/2017 1043   PLT 198 12/20/2017 0223   PLT 556 (H) 07/19/2017 1043   MCV 79.0 12/20/2017 0223   MCV 83 07/19/2017 1043   MCH 24.0 (L) 12/20/2017 0223   MCHC 30.4 12/20/2017 0223   RDW 14.9 12/20/2017 0223   RDW 15.3 07/19/2017 1043   LYMPHSABS 4.4 (H) 12/18/2017 2002   MONOABS 0.8 12/18/2017 2002   EOSABS 0.7 12/18/2017 2002   BASOSABS 0.0 12/18/2017 2002    CMP     Component Value Date/Time   NA 135 12/20/2017 0223   NA 139 07/19/2017 1043   K 3.4 (L) 12/20/2017 0223   CL 103 12/20/2017 0223   CO2 22 12/20/2017 0223   GLUCOSE 102 (H) 12/20/2017 0223   BUN 11 12/20/2017 0223   BUN 18 07/19/2017 1043   CREATININE 0.95 12/20/2017 0223   CALCIUM 8.6 (L) 12/20/2017 0223   PROT 7.6 12/18/2017 2002   ALBUMIN 4.1 12/18/2017 2002   AST 21 12/18/2017 2002   ALT 12 (L) 12/18/2017 2002   ALKPHOS 71 12/18/2017 2002   BILITOT 0.7 12/18/2017 2002   GFRNONAA >60 12/20/2017 0223   GFRAA >60 12/20/2017 0223   Assessment:  77 year old man with multiple seizures and status epilepticus in a patient with known history of seizures.  He was intubated as he was not protecting his airway and clinically appeared to be in status of this.  Stat EEG done did not reveal seizure activity.  Repeat exam was improved.  He was extubated yesterday and continues to do well.  His exam is nonfocal at this time.  Impression:  Breakthrough seizure in a patient with seizure disorder  Recommendations: Continue with the increased dose of Keppra 1 g twice daily. Avoid sedating medications Correction of any toxic metabolic derangements per primary team as you are. Given his clinical improvement and no status epilepticus on EEG or other EEG abnormality, I would not hold discharge if he is pending an MRI.  An MRI can be done as an outpatient on the outpatient neurologists discretion.  He had an MRI done in July 2018 as a part of the stroke  workup that did not reveal any lesions that would explain the seizures. Outpatient neurology follow-up in 4-6 weeks. Seizure precautions including no driving until 6 months seizure-free per [facility]. Detailed seizure precautions below. Neurology service will be available as needed.  Please call with questions.   Per [facility] statutes, patients with seizures are not allowed to drive until they have been seizure-free for six months.  Use caution when using heavy equipment or power tools. Avoid working on ladders or at heights. Take showers instead of baths. Ensure the water temperature is not too high on the home water heater. Do not go swimming alone. Do not lock yourself in a room alone (i.e. bathroom). When caring for infants or small children, sit down when holding, feeding, or changing them to minimize risk of injury to the child in the event you have a seizure. Maintain good sleep hygiene. Avoid alcohol.  If patient has another seizure, call 911  and bring them back to the ED if: A. The seizure lasts longer than 5 minutes.  B. The patient doesn't wake shortly after the seizure or has new problems such as difficulty seeing, speaking or moving following the seizure C. The patient was injured during the seizure D. The patient has a temperature over 102 F (39C) E. The patient vomited during the seizure and now is having trouble breathing  -- Amie Portland, MD [facility] Pager: [phone] If 7pm to 7am, please call on call as listed on AMION.

## 2017-12-26 ENCOUNTER — Ambulatory Visit (INDEPENDENT_AMBULATORY_CARE_PROVIDER_SITE_OTHER): Payer: Medicare Other | Admitting: Internal Medicine

## 2017-12-26 ENCOUNTER — Other Ambulatory Visit: Payer: Self-pay | Admitting: Internal Medicine

## 2017-12-26 ENCOUNTER — Encounter: Payer: Self-pay | Admitting: Internal Medicine

## 2017-12-26 VITALS — BP 177/85 | HR 56 | Ht 67.0 in | Wt 150.4 lb

## 2017-12-26 DIAGNOSIS — J449 Chronic obstructive pulmonary disease, unspecified: Secondary | ICD-10-CM

## 2017-12-26 DIAGNOSIS — R569 Unspecified convulsions: Secondary | ICD-10-CM | POA: Diagnosis not present

## 2017-12-26 DIAGNOSIS — Z951 Presence of aortocoronary bypass graft: Secondary | ICD-10-CM

## 2017-12-26 DIAGNOSIS — Z953 Presence of xenogenic heart valve: Secondary | ICD-10-CM | POA: Diagnosis not present

## 2017-12-26 DIAGNOSIS — I10 Essential (primary) hypertension: Secondary | ICD-10-CM | POA: Diagnosis not present

## 2017-12-26 MED ORDER — ALBUTEROL SULFATE 108 (90 BASE) MCG/ACT IN AEPB: 1.0000 | INHALATION_SPRAY | RESPIRATORY_TRACT | 0 refills | Status: DC | PRN

## 2017-12-26 MED ORDER — OLMESARTAN MEDOXOMIL 40 MG PO TABS: 40.0000 mg | ORAL_TABLET | Freq: Every day | ORAL | 5 refills | Status: DC

## 2017-12-26 NOTE — Patient Instructions (Signed)
Your physician has recommended you make the following change in your medication:  -- START olmesartan (benicar) 40mg  once daily -- use albuterol inhaler as needed  Your physician recommends that you schedule a follow-up appointment in: [place] with Dr. Debara Pickett

## 2017-12-26 NOTE — Progress Notes (Signed)
OFFICE CONSULT NOTE  Chief Complaint: Hospital follow-up, seizure  Primary Care Physician: Jani Gravel, MD  HPI:  Danny Krause is a 77 y.o. male who is being seen today for the evaluation of murmur at the request of Dr. Jani Gravel. Danny Krause is a pleasant 77 yo male whose wife is a patient of mine. Recently had a syncopal episode. He was doing some work outside in [employer]. He apparently passed out although this was unwitnessed. He was found down and thought possibly had a stroke. When EMS arrived they were planning to take him to the hospital and then he had a seizure. He apparently had a status post episode and eventually was broken of that seizure. He was placed on Keppra and has a follow-up with the neurologist next month. He denies any chest pain but does get short of breath. He is a 50 pack year smoker and continues to smoke. He reports when he was in his 71s he had workup for chest pain which included an evaluation that demonstrated a heart murmur. He was told that he should take antibiotics prior to dental visits but nothing more was made out of it. He did not follow-up with a doctor since then. He's not a fan of doctors, although we have significantly helped his wife.  06/01/2017  Danny Krause returns today for follow-up. He reports his wife is actually back in the hospital now. He seems to be under a lot of stress. Unfortunately the echo demonstrated severe aortic stenosis. This likely the cause of his syncopal episode and seizure. Mean gradient across the aortic valve is 87 mmHg with a calculated aortic valve area 0.4 cm. LVEF is 55-60% with normal wall motion and grade 1 diastolic dysfunction. I discussed this with him today and he seemed quite upset with the news. He understands that he will need aortic valve surgery. Given his lack of other comorbidities, I think EF she would be a good candidate for traditional aortic valve replacement, likely with a bioprosthetic valve. He will  need a heart catheterization prior to this. Right now since his wife's in the hospital, he wants to wait to schedule it until next week.  09/21/2017  Danny Krause returns today for hospital follow-up.  He reports worsening shortness of breath.  He has recently had a productive cough and rattle.  He has a history of smoking and recently stopped with his surgery.  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  He is on no therapy for this.  He is not had follow-up with his primary care provider.  He seems somewhat upset with inability to make an appointment with the office and is requesting names of other primary care providers for me today.  He denies any wheezing but gets short of breath with exertion and has a nonproductive cough.  He denies any significant weight gain, orthopnea or lower extremity edema.  He had an uneventful cardiac surgery and underwent aortic valve replacement and three-vessel coronary artery bypass grafting by Dr. Servando Snare on 07/04/2017, with placement of a 23 mm [employer] pericardial tissue valve and LIMA to the LAD, SVG to the diagonal and SVG to the distal right coronary.  He denies any anginal symptoms.  Postoperative echocardiography demonstrated normal valve function.  Danny Krause is significantly anxious and says he is not sleeping well at night.  His wife is been ill and is back in the hospital again with a leg infection.  He's concerned that she may need amputation.  12/26/2017  Danny Krause returns today for hospital follow-up.  Unfortunately recently had an episode of seizure.  This was likely due to hypercarbic respiratory failure.  His PCO2 was apparently over 100 and took several days to normalize.  He was intubated and eventually weaned off of the ventilator.  He had seizure activity as well and his Keppra has been increased.  He has a follow-up with pulmonary to see Dr. Lake Bells in May.  From a cardiac  standpoint he is been very stable after placement of his pericardial tissue valve and multivessel bypass.  He denies any recurrent chest pain.  Blood pressure is elevated today however at 177/85.  His son says his blood pressures been persistently elevated over the past several weeks.  PMHx:  Past Medical History:  Diagnosis Date  . Anginal pain ([place])    with exertion  . Coronary artery disease   . Critical aortic valve stenosis   . Dyspnea    with exertion  . Murmur   . Seizure ([place])   . Syncope     Past Surgical History:  Procedure Laterality Date  . AORTIC VALVE REPLACEMENT N/A 07/04/2017   Procedure: AORTIC VALVE REPLACEMENT (AVR);  Surgeon: Grace Isaac, MD;  Location: [place];  Service: Open Heart Surgery;  Laterality: N/A;  . CORONARY ARTERY BYPASS GRAFT N/A 07/04/2017   Procedure: CORONARY ARTERY BYPASS GRAFTING (CABG) x three, using left internal mammary artery and right leg greater saphenous vein harvested endoscopically;  Surgeon: Grace Isaac, MD;  Location: [place];  Service: Open Heart Surgery;  Laterality: N/A;  . NO PAST SURGERIES    . RIGHT/LEFT HEART CATH AND CORONARY ANGIOGRAPHY N/A 06/25/2017   Procedure: RIGHT/LEFT HEART CATH AND CORONARY ANGIOGRAPHY;  Surgeon: Troy Sine, MD;  Location: [place] [facility];  Service: Cardiovascular;  Laterality: N/A;  . TEE WITHOUT CARDIOVERSION N/A 07/04/2017   Procedure: TRANSESOPHAGEAL ECHOCARDIOGRAM (TEE);  Surgeon: Grace Isaac, MD;  Location: [facility];  Service: Open Heart Surgery;  Laterality: N/A;  . THORACIC AORTOGRAM  06/25/2017   Procedure: THORACIC AORTOGRAM;  Surgeon: Troy Sine, MD;  Location: [place] [facility];  Service: Cardiovascular;;  aortic root     FAMHx:  Family History  Problem Relation Age of Onset  . Hernia Father   . Stroke Brother     SOCHx:   reports that he quit smoking about 5 months ago. His smoking use included cigarettes. He has a 128.00 pack-year smoking history. He  has never used smokeless tobacco. He reports that he does not drink alcohol or use drugs.  ALLERGIES:  No Known Allergies  ROS: Pertinent items noted in HPI and remainder of comprehensive ROS otherwise negative.  HOME MEDS: Current Outpatient Medications on File Prior to Visit  Medication Sig Dispense Refill  . acetaminophen (TYLENOL) 650 MG CR tablet Take 650 mg by mouth every 8 (eight) hours as needed for pain.    . ASPIRIN 81 PO Take by mouth daily.    . budesonide-formoterol (SYMBICORT) 80-4.5 MCG/ACT inhaler Inhale 2 puffs into the lungs 2 (two) times daily. 1 Inhaler 1  . levETIRAcetam (KEPPRA) 500 MG tablet Take 2 tablets (1,000 mg total) by mouth 2 (two) times daily. 120 tablet 1  . metoprolol tartrate (LOPRESSOR) 25 MG tablet TAKE 1/2 TABLET BY MOUTH TWICE A DAY 90 tablet 0  . vitamin B-12 1000 MCG tablet Take 2 tablets (2,000 mcg total) by mouth daily.  60 tablet 3   No current facility-administered medications on file prior to visit.     LABS/IMAGING: No results found for this or any previous visit (from the past 48 hour(s)). No results found.  LIPID PANEL:    Component Value Date/Time   CHOL 196 03/13/2017 2338   TRIG 138 12/18/2017 2002   HDL 47 03/13/2017 2338   CHOLHDL 4.2 03/13/2017 2338   VLDL 15 03/13/2017 2338   LDLCALC 134 (H) 03/13/2017 2338    WEIGHTS: Wt Readings from Last 3 Encounters:  12/26/17 150 lb 6.4 oz (68.2 kg)  12/20/17 148 lb 2.4 oz (67.2 kg)  12/11/17 152 lb (68.9 kg)    VITALS: BP (!) 177/85   Pulse (!) 56   Ht 5\' 7"  (1.702 m)   Wt 150 lb 6.4 oz (68.2 kg)   SpO2 97%   BMI 23.56 kg/m   EXAM: General appearance: alert and Mildly anxious Neck: no carotid bruit, no JVD and thyroid not enlarged, symmetric, no tenderness/mass/nodules Lungs: diminished breath sounds bilaterally and rhonchi bilaterally Heart: regular rate and rhythm Abdomen: soft, non-tender; bowel sounds normal; no masses,  no organomegaly Extremities: extremities  normal, atraumatic, no cyanosis or edema Pulses: 2+ and symmetric Skin: Pale, cool, dry Neurologic: Grossly normal Psych: Anxious  EKG: Deferred  ASSESSMENT: 1. Aortic stenosis status post 23 mm [employer] pericardial tissue valve (07/2017) 2. Coronary artery disease status post three-vessel CABG with LIMA to LAD, SVG to diagonal, SVG to RCA (07/2017) 3. Dyspnea-likely secondary to COPD 4. Abnormal EKG suggesting pulmonary disease pattern 5. Recent syncopal episode 6. Seizure 7. Hypercarbic respiratory failure 8. Hypertension  PLAN: 1.   Danny Krause had an episode of hypercarbic respiratory failure and seizure likely secondary to high CO2.  He has follow-up with pulmonary to treat moderate to severe COPD.  From a cardiac standpoint his coronary artery disease is stable with recent CABG and aortic valve replacement.  Blood pressure is still elevated and will need additional treatment.  He is currently only on metoprolol.  Will add Benicar 40 mg daily to his current regimen.  Plan follow-up in about 3 months.  Pixie Casino, MD, [facility], [place] Director of the [facility] Lipid Disorders &  Cardiovascular Risk Reduction Clinic Diplomate of the American Board of Clinical Lipidology Attending Cardiologist  Direct Dial: [phone]  Fax: [phone]  Website:  [email or web address] 12/26/2017, 4:38 PM

## 2018-01-10 ENCOUNTER — Ambulatory Visit: Payer: Medicare Other | Admitting: Pulmonary Disease

## 2018-01-10 ENCOUNTER — Encounter: Payer: Self-pay | Admitting: Pulmonary Disease

## 2018-01-10 VITALS — BP 122/70 | HR 66 | Ht 67.0 in | Wt 151.8 lb

## 2018-01-10 DIAGNOSIS — J449 Chronic obstructive pulmonary disease, unspecified: Secondary | ICD-10-CM | POA: Diagnosis not present

## 2018-01-10 DIAGNOSIS — R569 Unspecified convulsions: Secondary | ICD-10-CM | POA: Diagnosis not present

## 2018-01-10 DIAGNOSIS — J9602 Acute respiratory failure with hypercapnia: Secondary | ICD-10-CM

## 2018-01-10 DIAGNOSIS — R0683 Snoring: Secondary | ICD-10-CM | POA: Diagnosis not present

## 2018-01-10 DIAGNOSIS — J9601 Acute respiratory failure with hypoxia: Secondary | ICD-10-CM

## 2018-01-10 MED ORDER — BUDESONIDE-FORMOTEROL FUMARATE 160-4.5 MCG/ACT IN AERO: 2.0000 | INHALATION_SPRAY | Freq: Two times a day (BID) | RESPIRATORY_TRACT | 0 refills | Status: DC

## 2018-01-10 MED ORDER — BUDESONIDE-FORMOTEROL FUMARATE 160-4.5 MCG/ACT IN AERO: 2.0000 | INHALATION_SPRAY | Freq: Two times a day (BID) | RESPIRATORY_TRACT | 5 refills | Status: DC

## 2018-01-10 MED ORDER — ALBUTEROL SULFATE HFA 108 (90 BASE) MCG/ACT IN AERS: 2.0000 | INHALATION_SPRAY | Freq: Four times a day (QID) | RESPIRATORY_TRACT | 3 refills | Status: DC | PRN

## 2018-01-10 NOTE — Patient Instructions (Signed)
COPD: Take Symbicort 2 puffs twice a day no matter how you feel Use albuterol as needed for chest tightness wheezing or shortness of breath, 2 puffs every 4-6 hours as needed  Chronic respiratory failure with hypoxemia: We will check an oxygen level while you are walking today to determine how much you need when you are exerting herself Keep using 2 L of oxygen at night for now  Daytime sleepiness, snoring: We will arrange for a home sleep study, do not use oxygen at night the night you have the sleep study  Possible carbon dioxide retention We will check an arterial blood gas  We will see you back in 2 to 4 weeks with either myself or a nurse practitioner to see how you are doing on the Symbicort.

## 2018-01-10 NOTE — Progress Notes (Signed)
Synopsis: Referred in May 2019 for COPD. She has a history of CAD and Aortic stenosis requiring valve replacement and CABG in 06/2017. In 12/2017 he was hospitalized for respiratory failure requiring mechanical ventilation.  He quit smoking in 06/2017 after smoking 2-3 packs per day for 65 years.  Subjective:   PATIENT ID: Danny Krause GENDER: male DOB: 04-18-1941, MRN: [id number]   HPI  Chief Complaint  Patient presents with  . pulmonary consult    per Danny Krause- pt repots of sob with exertion & prod cough with clear mucus x3y   Danny Krause is here today with his son who provides most of the history because he has been forgetful regarding the last few weks.  He was hospitalized in the setting of a seizure and acute respiratory failure with hypercarbia.  His carbon dioxide eventually normalized.    He had heart surgery in 2018 but came off of the ventilator fairly easily.  Afterwards he struggled with dyspnea afterwards.  Prior to that he was healthy until July 2018 when he developed some dyspnea.   He says that since coming home from the hospital he has been doing "not too bad".  If he is outside he coughs more which he attributes to the pollen. He can walk 150 without stopping.  He does not "constant shortness of breath" with really any activity.  He has only been using oxygen as needed while walking.  He struggles with frequent coughing spells.    He worked in [employer] for years.  He has been told to use oxygen ever since 06/2017.  He still sleeps on oxygen but he has "weaned himself off" since his surgery meaning that he basically has stopped using it during the daytime.    Past Medical History:  Diagnosis Date  . Anginal pain ([place])    with exertion  . Coronary artery disease   . Critical aortic valve stenosis   . Dyspnea    with exertion  . Murmur   . Seizure ([place])   . Syncope       Review of Systems  Constitutional: Negative for chills, fever, malaise/fatigue and  weight loss.  HENT: Negative for congestion, nosebleeds, sinus pain and sore throat.   Eyes: Negative for photophobia, pain and discharge.  Respiratory: Positive for cough and shortness of breath. Negative for hemoptysis, sputum production and wheezing.   Cardiovascular: Negative for chest pain, palpitations, orthopnea and leg swelling.  Gastrointestinal: Negative for abdominal pain, constipation, diarrhea, nausea and vomiting.  Genitourinary: Negative for dysuria, frequency, hematuria and urgency.  Musculoskeletal: Negative for back pain, joint pain, myalgias and neck pain.  Skin: Negative for itching and rash.  Neurological: Negative for tingling, tremors, sensory change, speech change, focal weakness, seizures, weakness and headaches.  Psychiatric/Behavioral: Negative for memory loss, substance abuse and suicidal ideas. The patient is nervous/anxious.       Objective:  Physical Exam   Vitals:   01/10/18 0905  BP: 122/70  Pulse: 66  SpO2: 97%  Weight: 151 lb 12.8 oz (68.9 kg)  Height: 5\' 7"  (1.702 m)    RA  Gen: chronically ill appearing, no acute distress HENT: NCAT, OP clear, neck supple without masses Eyes: PERRL, EOMi Lymph: no cervical lymphadenopathy PULM: wheezing and rhonchi bilaterally CV: RRR, no mgr, no JVD GI: BS+, soft, nontender, no hsm Derm: no rash or skin breakdown MSK: normal bulk and tone Neuro: A&Ox4, CN II-XII intact, strength 5/5 in all 4 extremities Psyche: normal mood and affect  CBC    Component Value Date/Time   WBC 7.3 12/20/2017 0223   RBC 3.96 (L) 12/20/2017 0223   HGB 9.5 (L) 12/20/2017 0223   HGB 9.6 (L) 07/19/2017 1043   HCT 31.3 (L) 12/20/2017 0223   HCT 31.2 (L) 07/19/2017 1043   PLT 198 12/20/2017 0223   PLT 556 (H) 07/19/2017 1043   MCV 79.0 12/20/2017 0223   MCV 83 07/19/2017 1043   MCH 24.0 (L) 12/20/2017 0223   MCHC 30.4 12/20/2017 0223   RDW 14.9 12/20/2017 0223   RDW 15.3 07/19/2017 1043   LYMPHSABS 4.4 (H)  12/18/2017 2002   MONOABS 0.8 12/18/2017 2002   EOSABS 0.7 12/18/2017 2002   BASOSABS 0.0 12/18/2017 2002     Chest imaging:  PFT: 06/2017 : FEV1 1.41L (53% pre), DLCO 8.35 ml (29%)  Labs:  Path:  Echo:  Heart Catheterization:  Hospital discharge summary from 12/2017 reviewed where he required mechanical ventilation for acute respiratory failure with hypoxemia. Neurolgy records from 12/2017 reviewed where he was treated with keppra for seizure.     Assessment & Plan:   COPD, severe ([place]) - Plan: Pulmonary function test, CANCELED: Blood Gas, Arterial  Snoring - Plan: Home sleep test  Acute respiratory failure with hypoxia and hypercapnia (HCC)  Seizure [facility])  Discussion: Pleasant 77 year old male who quit smoking after smoking 2 to 3 packs of cigarettes daily for 65 years in 2018.  He is here with at least moderate airflow obstruction based on October 2018 pulmonary function testing though he has more severe symptoms.  I think his symptoms are severe because of the fact that he is not taking any medicines right now.  We reviewed the difference between controller and rescue medicines and we will refill prescriptions for both the Symbicort and the albuterol today after going over technique.  I wonder whether or not he retains carbon dioxide or if his oxygen level has been dropping lower than we had previously identified.  I know this is been talked about when he was hospitalized and I wonder if this somehow contributes to the seizure spells that he had.  Plan: COPD: Take Symbicort 2 puffs twice a day no matter how you feel Use albuterol as needed for chest tightness wheezing or shortness of breath, 2 puffs every 4-6 hours as needed  Chronic respiratory failure with hypoxemia: We will check an oxygen level while you are walking today to determine how much you need when you are exerting herself Keep using 2 L of oxygen at night for now  Daytime sleepiness, snoring: We will  arrange for a home sleep study, do not use oxygen at night the night you have the sleep study  Possible carbon dioxide retention We will check an arterial blood gas  We will see you back in 2 to 4 weeks with either myself or a nurse practitioner to see how you are doing on the Symbicort.    Current Outpatient Medications:  .  acetaminophen (TYLENOL) 650 MG CR tablet, Take 650 mg by mouth every 8 (eight) hours as needed for pain., Disp: , Rfl:  .  ASPIRIN 81 PO, Take by mouth daily., Disp: , Rfl:  .  levETIRAcetam (KEPPRA) 500 MG tablet, Take 2 tablets (1,000 mg total) by mouth 2 (two) times daily., Disp: 120 tablet, Rfl: 1 .  metoprolol tartrate (LOPRESSOR) 25 MG tablet, TAKE 1/2 TABLET BY MOUTH TWICE A DAY, Disp: 90 tablet, Rfl: 0 .  olmesartan (BENICAR) 40 MG tablet, Take 1  tablet (40 mg total) by mouth daily., Disp: 30 tablet, Rfl: 5 .  albuterol (PROAIR HFA) 108 (90 Base) MCG/ACT inhaler, Inhale 2 puffs into the lungs every 6 (six) hours as needed for wheezing or shortness of breath., Disp: 1 Inhaler, Rfl: 3 .  budesonide-formoterol (SYMBICORT) 160-4.5 MCG/ACT inhaler, Inhale 2 puffs into the lungs 2 (two) times daily., Disp: 1 Inhaler, Rfl: 5 .  budesonide-formoterol (SYMBICORT) 160-4.5 MCG/ACT inhaler, Inhale 2 puffs into the lungs 2 (two) times daily for 1 day., Disp: 1 Inhaler, Rfl: 0

## 2018-01-16 ENCOUNTER — Ambulatory Visit (HOSPITAL_COMMUNITY)
Admission: RE | Admit: 2018-01-16 | Discharge: 2018-01-16 | Disposition: A | Discharge status: Home / Self Care | Payer: Medicare Other | Source: Ambulatory Visit | Attending: Pulmonary Disease | Admitting: Pulmonary Disease

## 2018-01-16 DIAGNOSIS — J449 Chronic obstructive pulmonary disease, unspecified: Secondary | ICD-10-CM | POA: Insufficient documentation

## 2018-01-16 LAB — BLOOD GAS, ARTERIAL
ACID-BASE DEFICIT: 0.8 mmol/L (ref 0.0–2.0)
Bicarbonate: 23.5 mmol/L (ref 20.0–28.0)
Drawn by: 295031
FIO2: 21
O2 SAT: 93.5 %
PCO2 ART: 39.4 mmHg (ref 32.0–48.0)
PO2 ART: 71.3 mmHg — AB (ref 83.0–108.0)
Patient temperature: 98.6
pH, Arterial: 7.392 (ref 7.350–7.450)

## 2018-01-23 NOTE — Progress Notes (Signed)
@Patient  ID: Danny Krause, male    DOB: 03/28/1941, 77 y.o.   MRN: [id number]  Chief Complaint  Patient presents with  . Follow-up    COPD-HS O2 2L     Referring provider: Jani Gravel, MD  HPI: 77 year old male patient referred initially made 2019 for COPD.  Patient to Dr. Lake Bells. Significant past medical history of CAD, aortic stenosis requiring valve replacement and CABG in October 2018.  Hospitalized in 12/2017 for respiratory failure requiring mechanical ventilation.  Former smoker (quit 06/2017) after smoking 2 to 3 packs/day for 65 years. 128 pack years.   Recent [facility] Encounters:   01/10/2018-an initial office visit-[place] Was recently hospitalized in the setting of a seizure and acute respiratory failure with hypercarbia.  Patient reports that he has been doing better since coming home.  Walking about 150 feet without stopping.  He is only been using oxygen as needed while walking.  Still struggling with frequent coughing spells.  Patient also reports weaning himself off of oxygen since his October 2018 surgery.  He is reporting he is basically not using it at all during the daytime hospital discharge summary shows that he required mechanical ventilation for acute respiratory failure, neurology records from April 2019 reviewed where he was treated with Keppra for seizure. Plan: Home sleep test, reviewed the importance of controller medications as well as rescue medications.  Refilled Symbicort and albuterol.  Oximetry walk, keep using 2 L of oxygen at night, arterial blood gas follow-up in 2 to 4 weeks   Tests:  Home sleep test- to be completed February 06 2018  PFT: 06/2017 : FEV1 1.41L (53% pre), DLCO 8.35 ml (29%)  Imaging:  12/20/2017-chest x-ray- mild right basilar atelectasis 07/02/2017-CT Angio chest with contrast- no evidence of thoracic aortic aneurysm, aortic arthrosclerosis, 1.3 cm solid nodule in left lower lobe, 1.7 cm solid nodule in the right lower  lobe  Cardiac:  12/19/2017-echocardiogram- LV ejection fraction 55 to 48%, grade 1 diastolic dysfunction   Labs:  01/16/2018-arterial blood gas- pH 7.3, PCO2 39.4, PO2 71.3, bicarb 23.5  12/20/2017-CBC- hemoglobin 9.5 12/20/17-BMP- potassium 3.4, calcium 8.6, stable creatinine and GFR greater than 60  Chart Review:  12/2017-hospital discharge for respiratory failure and seizures (treated with Keppra), required mechanical ventilation  >>>12/19/2017-EEG- no seizure or seizure predicted predisposition recorded on the study 06/2017- hospitalization status post CABG  01/24/2018 Office visit Patient reports with son for follow-up appointment today.  Patient states that feeling much better with taking the Symbicort 2 puffs twice a day.  Patient has not needed to use his rescue inhaler at all.  Patient is not currently bringing his rescue inhaler with him.  Patient reports he has had pneumonia vaccines in the past from his pharmacy.  Last one was in October 2018.  Patient will bring records to next appointment or call office.  Patient and son reporting that he is remaining active, walking to the mailbox, but trying to avoid high pollen days and remaining inside when it is hot.  Patient reporting home sleep study will be performed on February 06, 2018.  Patient and son both report concerns with pain for Symbicort.  I need help applying for patient assistance program.  No Known Allergies  Immunization History  Administered Date(s) Administered  . Influenza, High Dose Seasonal PF 07/08/2017  . Tdap 03/13/2017    Past Medical History:  Diagnosis Date  . Anginal pain ([place])    with exertion  . Coronary artery disease   .  Critical aortic valve stenosis   . Dyspnea    with exertion  . Murmur   . Seizure ([place])   . Syncope     Tobacco History: Social History   Tobacco Use  Smoking Status Former Smoker  . Packs/day: 2.00  . Years: 64.00  . Pack years: 128.00  . Types: Cigarettes  . Last attempt  to quit: 07/04/2017  . Years since quitting: 0.5  Smokeless Tobacco Never Used  Tobacco Comment   SINCE I WAS 77 YRS OLD   Counseling given: Yes Comment: SINCE I WAS 77 YRS OLD >>> Still not smoking, congratulated patient, supported the decision, emphasized how important it is to continue to not  Outpatient Encounter Medications as of 01/24/2018  Medication Sig  . acetaminophen (TYLENOL) 650 MG CR tablet Take 650 mg by mouth every 8 (eight) hours as needed for pain.  Marland Kitchen albuterol (PROAIR HFA) 108 (90 Base) MCG/ACT inhaler Inhale 2 puffs into the lungs every 6 (six) hours as needed for wheezing or shortness of breath.  . ASPIRIN 81 PO Take by mouth daily.  . budesonide-formoterol (SYMBICORT) 160-4.5 MCG/ACT inhaler Inhale 2 puffs into the lungs 2 (two) times daily.  Marland Kitchen levETIRAcetam (KEPPRA) 500 MG tablet Take 2 tablets (1,000 mg total) by mouth 2 (two) times daily.  . metoprolol tartrate (LOPRESSOR) 25 MG tablet TAKE 1/2 TABLET BY MOUTH TWICE A DAY  . olmesartan (BENICAR) 40 MG tablet Take 1 tablet (40 mg total) by mouth daily.  . [DISCONTINUED] budesonide-formoterol (SYMBICORT) 160-4.5 MCG/ACT inhaler Inhale 2 puffs into the lungs 2 (two) times daily for 1 day.   No facility-administered encounter medications on file as of 01/24/2018.      Review of Systems  Constitutional:   No  weight loss, night sweats,  fevers, chills, fatigue, or  lassitude HEENT:   No headaches,  Difficulty swallowing,  Tooth/dental problems, or  Sore throat, No sneezing, itching, ear ache, nasal congestion, post nasal drip  CV: No chest pain,  orthopnea, PND, swelling in lower extremities, anasarca, dizziness, palpitations, syncope  GI: No heartburn, indigestion, abdominal pain, nausea, vomiting, diarrhea, change in bowel habits, loss of appetite, bloody stools Resp: +occasional shortness of breath when walking outside or being outside too long, resolves with deep breathing, occasional yellow and white mucous  No  excess mucus No non-productive cough,  No coughing up of blood.  No change in color of mucus.  No wheezing.  No chest wall deformity Skin: no rash, lesions, no skin changes. GU: no dysuria, change in color of urine, no urgency or frequency.  No flank pain, no hematuria  MS:  No joint pain or swelling.  No decreased range of motion.  No back pain. Psych:  No change in mood or affect. No depression or anxiety.  No memory loss.   Physical Exam  BP 108/70 (BP Location: Left Arm, Cuff Size: Normal)   Pulse 65   Ht 5\' 7"  (1.702 m)   Wt 155 lb 9.6 oz (70.6 kg)   SpO2 94%   BMI 24.37 kg/m   GEN: A/Ox3; pleasant , NAD, well nourished    HEENT:  [place]/AT,  EACs-clear, pt using hearing aids bilaterally, TMs-wnl, NOSE-clear, THROAT-clear, no lesions, no postnasal drip or exudate noted.   NECK:  Supple w/ fair ROM; no JVD;  no thyromegaly or nodules palpated; no lymphadenopathy.    RESP:  +Faint rhonchi in bases, no wheezes  no accessory muscle use, no dullness to percussion  CARD:  RRR,  no m/r/g, no peripheral edema, pulses intact, no cyanosis or clubbing.  GI:   Soft & nt; nml bowel sounds; no organomegaly or masses detected.   Musco: Warm bil, no deformities or joint swelling noted.   Neuro: alert, no focal deficits noted.    Skin: Warm, no lesions or rashes   Lab Results:  CBC    Component Value Date/Time   WBC 7.3 12/20/2017 0223   RBC 3.96 (L) 12/20/2017 0223   HGB 9.5 (L) 12/20/2017 0223   HGB 9.6 (L) 07/19/2017 1043   HCT 31.3 (L) 12/20/2017 0223   HCT 31.2 (L) 07/19/2017 1043   PLT 198 12/20/2017 0223   PLT 556 (H) 07/19/2017 1043   MCV 79.0 12/20/2017 0223   MCV 83 07/19/2017 1043   MCH 24.0 (L) 12/20/2017 0223   MCHC 30.4 12/20/2017 0223   RDW 14.9 12/20/2017 0223   RDW 15.3 07/19/2017 1043   LYMPHSABS 4.4 (H) 12/18/2017 2002   MONOABS 0.8 12/18/2017 2002   EOSABS 0.7 12/18/2017 2002   BASOSABS 0.0 12/18/2017 2002    BMET    Component Value Date/Time   NA  135 12/20/2017 0223   NA 139 07/19/2017 1043   K 3.4 (L) 12/20/2017 0223   CL 103 12/20/2017 0223   CO2 22 12/20/2017 0223   GLUCOSE 102 (H) 12/20/2017 0223   BUN 11 12/20/2017 0223   BUN 18 07/19/2017 1043   CREATININE 0.95 12/20/2017 0223   CALCIUM 8.6 (L) 12/20/2017 0223   GFRNONAA >60 12/20/2017 0223   GFRAA >60 12/20/2017 0223    BNP No results found for: BNP  ProBNP No results found for: PROBNP  Imaging: No results found.   Assessment & Plan:   Pleasant 77 year old man presents today for follow-up from initial office visit/hospital discharge.  Patient reports he been doing well on the Symbicort and not needing rescue inhaler.  No other complaints at this point in time.  We will start the patient assistance program application for Symbicort today.  We will order CT for serial imaging of lung nodule seen on October 2018 CT.  Follow-up in office in 4 months or sooner if respiratory status changes.  Seizure ([place]) No seizures since hospital discharge.   Continue follow-up with primary care.  COPD, severe ([place]) Symbicort 160 samples provided today Educated patient and son on how to apply for Symbicort patient assistance program.  >>> Process started today Reminded patient importance of follow-up if he is running low on medications Continue albuterol rescue inhaler as needed Follow-up with pulmonary office if having changes in your breathing, fever, or having issues obtaining her medications   Lung nodule seen on imaging study CT ordered today for serial follow-up on lung nodules found on October 2018 [place], NP 01/24/2018

## 2018-01-24 ENCOUNTER — Inpatient Hospital Stay (HOSPITAL_COMMUNITY)
Admission: EM | Admit: 2018-01-24 | Discharge: 2018-02-11 | DRG: 004 | Disposition: A | Discharge status: Skilled Nursing Facility | Payer: Medicare Other | Attending: Internal Medicine | Admitting: Internal Medicine

## 2018-01-24 ENCOUNTER — Encounter: Payer: Self-pay | Admitting: Pulmonary Disease

## 2018-01-24 ENCOUNTER — Emergency Department (HOSPITAL_COMMUNITY): Payer: Medicare Other

## 2018-01-24 ENCOUNTER — Telehealth: Payer: Self-pay | Admitting: Pulmonary Disease

## 2018-01-24 ENCOUNTER — Ambulatory Visit (INDEPENDENT_AMBULATORY_CARE_PROVIDER_SITE_OTHER): Payer: Medicare Other | Admitting: Pulmonary Disease

## 2018-01-24 VITALS — BP 108/70 | HR 65 | Ht 67.0 in | Wt 155.6 lb

## 2018-01-24 DIAGNOSIS — G40911 Epilepsy, unspecified, intractable, with status epilepticus: Principal | ICD-10-CM | POA: Diagnosis present

## 2018-01-24 DIAGNOSIS — Z87891 Personal history of nicotine dependence: Secondary | ICD-10-CM

## 2018-01-24 DIAGNOSIS — R911 Solitary pulmonary nodule: Secondary | ICD-10-CM | POA: Diagnosis not present

## 2018-01-24 DIAGNOSIS — I10 Essential (primary) hypertension: Secondary | ICD-10-CM | POA: Diagnosis present

## 2018-01-24 DIAGNOSIS — Z79899 Other long term (current) drug therapy: Secondary | ICD-10-CM

## 2018-01-24 DIAGNOSIS — I952 Hypotension due to drugs: Secondary | ICD-10-CM | POA: Diagnosis not present

## 2018-01-24 DIAGNOSIS — J9601 Acute respiratory failure with hypoxia: Secondary | ICD-10-CM | POA: Diagnosis not present

## 2018-01-24 DIAGNOSIS — Z951 Presence of aortocoronary bypass graft: Secondary | ICD-10-CM

## 2018-01-24 DIAGNOSIS — R1310 Dysphagia, unspecified: Secondary | ICD-10-CM | POA: Diagnosis present

## 2018-01-24 DIAGNOSIS — G40909 Epilepsy, unspecified, not intractable, without status epilepticus: Secondary | ICD-10-CM | POA: Diagnosis present

## 2018-01-24 DIAGNOSIS — Z452 Encounter for adjustment and management of vascular access device: Secondary | ICD-10-CM

## 2018-01-24 DIAGNOSIS — R4701 Aphasia: Secondary | ICD-10-CM | POA: Diagnosis present

## 2018-01-24 DIAGNOSIS — R569 Unspecified convulsions: Secondary | ICD-10-CM

## 2018-01-24 DIAGNOSIS — Z9981 Dependence on supplemental oxygen: Secondary | ICD-10-CM

## 2018-01-24 DIAGNOSIS — G40301 Generalized idiopathic epilepsy and epileptic syndromes, not intractable, with status epilepticus: Secondary | ICD-10-CM

## 2018-01-24 DIAGNOSIS — E876 Hypokalemia: Secondary | ICD-10-CM | POA: Diagnosis present

## 2018-01-24 DIAGNOSIS — R0989 Other specified symptoms and signs involving the circulatory and respiratory systems: Secondary | ICD-10-CM | POA: Diagnosis not present

## 2018-01-24 DIAGNOSIS — J9602 Acute respiratory failure with hypercapnia: Secondary | ICD-10-CM | POA: Diagnosis present

## 2018-01-24 DIAGNOSIS — Z9289 Personal history of other medical treatment: Secondary | ICD-10-CM

## 2018-01-24 DIAGNOSIS — G9341 Metabolic encephalopathy: Secondary | ICD-10-CM | POA: Diagnosis present

## 2018-01-24 DIAGNOSIS — D649 Anemia, unspecified: Secondary | ICD-10-CM | POA: Diagnosis present

## 2018-01-24 DIAGNOSIS — R4020 Unspecified coma: Secondary | ICD-10-CM | POA: Diagnosis not present

## 2018-01-24 DIAGNOSIS — Z4682 Encounter for fitting and adjustment of non-vascular catheter: Secondary | ICD-10-CM | POA: Diagnosis not present

## 2018-01-24 DIAGNOSIS — J449 Chronic obstructive pulmonary disease, unspecified: Secondary | ICD-10-CM | POA: Diagnosis not present

## 2018-01-24 DIAGNOSIS — J9690 Respiratory failure, unspecified, unspecified whether with hypoxia or hypercapnia: Secondary | ICD-10-CM

## 2018-01-24 DIAGNOSIS — D509 Iron deficiency anemia, unspecified: Secondary | ICD-10-CM | POA: Diagnosis present

## 2018-01-24 DIAGNOSIS — Z953 Presence of xenogenic heart valve: Secondary | ICD-10-CM

## 2018-01-24 DIAGNOSIS — Z978 Presence of other specified devices: Secondary | ICD-10-CM

## 2018-01-24 DIAGNOSIS — N179 Acute kidney failure, unspecified: Secondary | ICD-10-CM | POA: Diagnosis not present

## 2018-01-24 DIAGNOSIS — E872 Acidosis: Secondary | ICD-10-CM | POA: Diagnosis present

## 2018-01-24 DIAGNOSIS — Z7951 Long term (current) use of inhaled steroids: Secondary | ICD-10-CM

## 2018-01-24 DIAGNOSIS — J40 Bronchitis, not specified as acute or chronic: Secondary | ICD-10-CM

## 2018-01-24 DIAGNOSIS — I2510 Atherosclerotic heart disease of native coronary artery without angina pectoris: Secondary | ICD-10-CM | POA: Diagnosis present

## 2018-01-24 DIAGNOSIS — R402441 Other coma, without documented Glasgow coma scale score, or with partial score reported, in the field [EMT or ambulance]: Secondary | ICD-10-CM | POA: Diagnosis not present

## 2018-01-24 DIAGNOSIS — T4275XA Adverse effect of unspecified antiepileptic and sedative-hypnotic drugs, initial encounter: Secondary | ICD-10-CM | POA: Diagnosis not present

## 2018-01-24 DIAGNOSIS — Z930 Tracheostomy status: Secondary | ICD-10-CM

## 2018-01-24 DIAGNOSIS — Z7982 Long term (current) use of aspirin: Secondary | ICD-10-CM

## 2018-01-24 DIAGNOSIS — I69398 Other sequelae of cerebral infarction: Secondary | ICD-10-CM

## 2018-01-24 LAB — CBC WITH DIFFERENTIAL/PLATELET
BASOS ABS: 0 10*3/uL (ref 0.0–0.1)
Basophils Relative: 0 %
EOS PCT: 5 %
Eosinophils Absolute: 0.7 10*3/uL (ref 0.0–0.7)
HEMATOCRIT: 35.7 % — AB (ref 39.0–52.0)
Hemoglobin: 10.9 g/dL — ABNORMAL LOW (ref 13.0–17.0)
LYMPHS PCT: 19 %
Lymphs Abs: 3 10*3/uL (ref 0.7–4.0)
MCH: 25.2 pg — ABNORMAL LOW (ref 26.0–34.0)
MCHC: 30.5 g/dL (ref 30.0–36.0)
MCV: 82.4 fL (ref 78.0–100.0)
Monocytes Absolute: 0.8 10*3/uL (ref 0.1–1.0)
Monocytes Relative: 5 %
NEUTROS ABS: 11.1 10*3/uL — AB (ref 1.7–7.7)
Neutrophils Relative %: 71 %
Platelets: 253 10*3/uL (ref 150–400)
RBC: 4.33 MIL/uL (ref 4.22–5.81)
RDW: 15 % (ref 11.5–15.5)
WBC: 15.6 10*3/uL — AB (ref 4.0–10.5)

## 2018-01-24 LAB — COMPREHENSIVE METABOLIC PANEL
ALT: 21 U/L (ref 17–63)
ANION GAP: 10 (ref 5–15)
AST: 33 U/L (ref 15–41)
Albumin: 4.4 g/dL (ref 3.5–5.0)
Alkaline Phosphatase: 65 U/L (ref 38–126)
BILIRUBIN TOTAL: 0.2 mg/dL — AB (ref 0.3–1.2)
BUN: 18 mg/dL (ref 6–20)
CO2: 27 mmol/L (ref 22–32)
Calcium: 9 mg/dL (ref 8.9–10.3)
Chloride: 101 mmol/L (ref 101–111)
Creatinine, Ser: 1.63 mg/dL — ABNORMAL HIGH (ref 0.61–1.24)
GFR, EST AFRICAN AMERICAN: 45 mL/min — AB (ref >60)
GFR, EST NON AFRICAN AMERICAN: 39 mL/min — AB (ref >60)
Glucose, Bld: 206 mg/dL — ABNORMAL HIGH (ref 65–99)
POTASSIUM: 4.1 mmol/L (ref 3.5–5.1)
Sodium: 138 mmol/L (ref 135–145)
TOTAL PROTEIN: 8.1 g/dL (ref 6.5–8.1)

## 2018-01-24 LAB — ETHANOL: Alcohol, Ethyl (B): <10 mg/dL (ref <10)

## 2018-01-24 LAB — CBG MONITORING, ED: Glucose-Capillary: 177 mg/dL — ABNORMAL HIGH (ref 65–99)

## 2018-01-24 MED ORDER — LORAZEPAM 2 MG/ML IJ SOLN
1.0000 mg | Freq: Once | INTRAMUSCULAR | Status: AC
Start: 2018-01-24 — End: 2018-01-24
  Administered 2018-01-24: 1 mg via INTRAVENOUS
  Filled 2018-01-24: qty 1

## 2018-01-24 MED ORDER — LORAZEPAM 2 MG/ML IJ SOLN: 1.0000 mg | Freq: Once | INTRAMUSCULAR | Status: DC

## 2018-01-24 NOTE — Assessment & Plan Note (Signed)
CT ordered today for serial follow-up on lung nodules found on October 2018 CT Angio

## 2018-01-24 NOTE — Assessment & Plan Note (Signed)
No seizures since hospital discharge.   Continue follow-up with primary care.

## 2018-01-24 NOTE — Telephone Encounter (Signed)
Spoke with pt's son in the lobby. He is dropping off patient assistance forms for Symbicort. Forms have been filled out completely and faxed to [employer]. Will route to Burman Nieves for follow up.

## 2018-01-24 NOTE — Progress Notes (Signed)
Reviewed, agree 

## 2018-01-24 NOTE — Patient Instructions (Addendum)
Symbicort 160 sample provided today >>> Rinse mouth out after use  Continue albuterol rescue inhaler  Only use your albuterol as a rescue medication to be used if you can't catch your breath by resting or doing a relaxed purse lip breathing pattern.  - The less you use it, the better it will work when you need it. - Ok to use up to 2 puffs  every 4 hours if you must but call for immediate appointment if use goes up over your usual need - Don't leave home without it !!  (think of it like the spare tire for your car)   Follow-up with [facility] or drugstore to get records of your recent pneumonia vaccines.  Contact the office or bring record to your next appointment  CT without contrast ordered today for follow-up on the lung nodules seen on CT Angio in October 2018  Complete home sleep study on February 06, 2018  Please contact office if you are having any issues affording her medications, obtaining her medications, or any changes in your respiratory symptoms or breathing.  Follow-up with Dr. Lake Bells in 4 months   Please contact the office if your symptoms worsen or you have concerns that you are not improving.   Thank you for choosing [facility] for your healthcare, and for allowing [place] to partner with you on your healthcare journey. I am thankful to be able to provide care to you today.   Wyn Quaker FNP-C

## 2018-01-24 NOTE — ED Provider Notes (Signed)
[facility] DEPT Provider Note   CSN: [id number] Arrival date & time: 01/24/18  2241     History   Chief Complaint Chief Complaint  Patient presents with  . Seizures    HPI Danny Krause is a 77 y.o. male.  Patient is a 77 year old male with extensive past medical history including coronary artery disease status post CABG, COPD, and seizure disorder.  He was recently admitted for altered mental status felt to be related to status epilepticus.  He became hypoxic and required intubation.  He is brought by EMS for evaluation of altered mental status.  He was at home this evening when he began to stare and became unresponsive.  He was given Versed by EMS with little change.  The patient adds no additional history secondary to medical condition.  The history is provided by the patient.  Seizures   This is a recurrent problem. The current episode started less than 1 hour ago. The problem has not changed since onset.Characteristics include eye deviation. The episode was witnessed. There has been no fever.    Past Medical History:  Diagnosis Date  . Anginal pain ([place])    with exertion  . Coronary artery disease   . Critical aortic valve stenosis   . Dyspnea    with exertion  . Murmur   . Seizure ([place])   . Syncope     Patient Active Problem List   Diagnosis Date Noted  . Lung nodule seen on imaging study 01/24/2018  . Uncontrolled hypertension 12/26/2017  . Acute respiratory failure with hypoxia and hypercapnia ([place]) 12/18/2017  . Acute respiratory failure ([place]) 12/18/2017  . COPD, severe ([place]) 09/21/2017  . 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 S/P aortic valve replacement with bioprosthetic valve 07/04/2017  . Syncope 06/01/2017  . Cardiac murmur 05/08/2017  . Seizure ([place]) 03/13/2017    Past Surgical History:  Procedure Laterality Date  . AORTIC VALVE REPLACEMENT N/A 07/04/2017   Procedure: AORTIC VALVE REPLACEMENT (AVR);  Surgeon: Grace Isaac, MD;  Location: [facility];  Service: Open Heart Surgery;  Laterality: N/A;  . CORONARY ARTERY BYPASS GRAFT N/A 07/04/2017   Procedure: CORONARY ARTERY BYPASS GRAFTING (CABG) x three, using left internal mammary artery and right leg greater saphenous vein harvested endoscopically;  Surgeon: Grace Isaac, MD;  Location: [facility];  Service: Open Heart Surgery;  Laterality: N/A;  . NO PAST SURGERIES    . RIGHT/LEFT HEART CATH AND CORONARY ANGIOGRAPHY N/A 06/25/2017   Procedure: RIGHT/LEFT HEART CATH AND CORONARY ANGIOGRAPHY;  Surgeon: Troy Sine, MD;  Location: [facility];  Service: Cardiovascular;  Laterality: N/A;  . TEE WITHOUT CARDIOVERSION N/A 07/04/2017   Procedure: TRANSESOPHAGEAL ECHOCARDIOGRAM (TEE);  Surgeon: Grace Isaac, MD;  Location: [facility];  Service: Open Heart Surgery;  Laterality: N/A;  . THORACIC AORTOGRAM  06/25/2017   Procedure: THORACIC AORTOGRAM;  Surgeon: Troy Sine, MD;  Location: [facility];  Service: Cardiovascular;;  aortic root         Home Medications    Prior to Admission medications   Medication Sig Start Date End Date Taking? Authorizing Provider  acetaminophen (TYLENOL) 650 MG CR tablet Take 650 mg by mouth every 8 (eight) hours as needed for pain.    [provider]  albuterol (PROAIR HFA) 108 (90 Base) MCG/ACT inhaler Inhale 2 puffs into the lungs every 6 (six) hours as needed for wheezing or shortness of breath. 01/10/18  Juanito Doom, MD  ASPIRIN 81 PO Take by mouth daily.    [provider]  budesonide-formoterol (SYMBICORT) 160-4.5 MCG/ACT inhaler Inhale 2 puffs into the lungs 2 (two) times daily. 01/10/18   Juanito Doom, MD  levETIRAcetam (KEPPRA) 500 MG tablet Take 2 tablets (1,000 mg total) by mouth 2 (two) times daily. 12/20/17   Samuella Cota, MD  metoprolol tartrate (LOPRESSOR) 25 MG tablet TAKE 1/2 TABLET BY MOUTH TWICE A DAY 12/05/17   Hilty, Nadean Corwin, MD  olmesartan (BENICAR) 40  MG tablet Take 1 tablet (40 mg total) by mouth daily. 12/26/17   Hilty, Nadean Corwin, MD    Family History Family History  Problem Relation Age of Onset  . Hernia Father   . Stroke Brother     Social History Social History   Tobacco Use  . Smoking status: Former Smoker    Packs/day: 2.00    Years: 64.00    Pack years: 128.00    Types: Cigarettes    Last attempt to quit: 07/04/2017    Years since quitting: 0.5  . Smokeless tobacco: Never Used  . Tobacco comment: SINCE I WAS 77 YRS OLD  Substance Use Topics  . Alcohol use: No  . Drug use: No     Allergies   Patient has no known allergies.   Review of Systems Review of Systems  Unable to perform ROS: Acuity of condition  Neurological: Positive for seizures.     Physical Exam Updated Vital Signs BP (!) 212/108   Pulse (!) 101   Resp (!) 27   SpO2 95%   Physical Exam  Constitutional: He appears well-developed and well-nourished. No distress.  HENT:  Head: Normocephalic and atraumatic.  Mouth/Throat: Oropharynx is clear and moist.  Neck: Normal range of motion. Neck supple.  Cardiovascular: Normal rate and regular rhythm. Exam reveals no friction rub.  No murmur heard. Pulmonary/Chest: Effort normal and breath sounds normal. No respiratory distress. He has no wheezes. He has no rales.  Abdominal: Soft. Bowel sounds are normal. He exhibits no distension. There is no tenderness.  Musculoskeletal: Normal range of motion. He exhibits no edema.  Neurological:  Patient is lying supine in the exam stretcher.  He is staring to the left and not following commands.  He will make groaning noises when stimulated.  Skin: Skin is warm and dry. He is not diaphoretic.  Nursing note and vitals reviewed.    ED Treatments / Results  Labs (all labs ordered are listed, but only abnormal results are displayed) Labs Reviewed - No data to display  EKG EKG Interpretation  Date/Time:  Thursday Jan 24 2018 22:53:18  EDT Ventricular Rate:  99 PR Interval:    QRS Duration: 121 QT Interval:  347 QTC Calculation: 446 R Axis:   109 Text Interpretation:  Sinus rhythm Nonspecific intraventricular conduction delay No significant change from 12/18/2017 Confirmed by Veryl Speak [phone]) on 01/24/2018 11:12:04 PM   Radiology No results found.  Procedures Procedure Name: Intubation Date/Time: 01/25/2018 3:05 AM Performed by: Veryl Speak, MD Pre-anesthesia Checklist: Patient identified, Emergency Drugs available, Suction available, Patient being monitored and Timeout performed Oxygen Delivery Method: Ambu bag Preoxygenation: Pre-oxygenation with 100% oxygen Induction Type: IV induction and Rapid sequence Ventilation: Mask ventilation without difficulty Laryngoscope Size: Glidescope and 4 Grade View: Grade I Tube size: 7.5 mm Number of attempts: 1 Placement Confirmation: ETT inserted through vocal cords under direct vision,  Positive ETCO2 and Breath sounds checked- equal and bilateral Tube  secured with: ETT holder      (including critical care time)  Medications Ordered in ED Medications  LORazepam (ATIVAN) injection 1 mg (has no administration in time range)     Initial Impression / Assessment and Plan / ED Course  I have reviewed the triage vital signs and the nursing notes.  Pertinent labs & imaging results that were available during my care of the patient were reviewed by me and considered in my medical decision making (see chart for details).  Patient presents by EMS after an apparent seizure at home.  He has been somnolent and unarousable since.  He arrived here with gaze deviation to the left and minimally responsive.  He would grunt when stimulated, but made no attempts to follow commands or speak.  He was given additional Ativan and observed for period of time, however his situation did not improve.  Due to his recent admission with hypercarbic respiratory failure, an ABG was obtained.   This revealed a pH of 6.95 with PCO2 greater than 120 reflective of a profound respiratory acidosis.  I have discussed patient's care with Dr. Leonel Ramsay from neurology who has also come to evaluate the patient.  We have decided to proceed with intubation due to the patient's respiratory failure.  RSI was performed using etomidate and succinylcholine.  The glide scope was used and cords were easily visualized.  A 7.5 endotracheal tube was easily passed and tube placement was confirmed with direct visualization, end-tidal CO2, and auscultation of the chest and abdomen.  I have discussed the patient's care with Dr. Jimmy Footman from critical care who agrees to admit the patient to the critical care unit.  CRITICAL CARE Performed by: Veryl Speak Total critical care time: 70 minutes Critical care time was exclusive of separately billable procedures and treating other patients. Critical care was necessary to treat or prevent imminent or life-threatening deterioration. Critical care was time spent personally by me on the following activities: development of treatment plan with patient and/or surrogate as well as nursing, discussions with consultants, evaluation of patient's response to treatment, examination of patient, obtaining history from patient or surrogate, ordering and performing treatments and interventions, ordering and review of laboratory studies, ordering and review of radiographic studies, pulse oximetry and re-evaluation of patient's condition.   Final Clinical Impressions(s) / ED Diagnoses   Final diagnoses:  None    ED Discharge Orders    None       Veryl Speak, MD 01/25/18 [phone]

## 2018-01-24 NOTE — Assessment & Plan Note (Signed)
Symbicort 160 samples provided today Educated patient and son on how to apply for Symbicort patient assistance program.  >>> Process started today Reminded patient importance of follow-up if he is running low on medications Continue albuterol rescue inhaler as needed Follow-up with pulmonary office if having changes in your breathing, fever, or having issues obtaining her medications

## 2018-01-24 NOTE — ED Triage Notes (Signed)
Pt BIB [facility]. Pt brought from home. 2130 a family member stated that the pt was not responding normally. Unknown if pt had a seizure before family found him. Initial GCS was an 8. EMS stated that the pt was sitting on the toilet and started having an upward gaze with some rigidity and 2.5 of versed was given symptoms subsided. Pt was incontinent of urine. Pt is still postictal upon arrival. No signs of trauma noted.  Hx of intubation due to hypoxia from seizure a few months ago.

## 2018-01-25 ENCOUNTER — Emergency Department (HOSPITAL_COMMUNITY): Payer: Medicare Other

## 2018-01-25 ENCOUNTER — Encounter (HOSPITAL_COMMUNITY): Payer: Self-pay

## 2018-01-25 ENCOUNTER — Other Ambulatory Visit: Payer: Self-pay

## 2018-01-25 ENCOUNTER — Inpatient Hospital Stay (HOSPITAL_COMMUNITY): Payer: Medicare Other

## 2018-01-25 DIAGNOSIS — R569 Unspecified convulsions: Secondary | ICD-10-CM

## 2018-01-25 DIAGNOSIS — E872 Acidosis: Secondary | ICD-10-CM | POA: Diagnosis present

## 2018-01-25 DIAGNOSIS — E876 Hypokalemia: Secondary | ICD-10-CM | POA: Diagnosis present

## 2018-01-25 DIAGNOSIS — G40901 Epilepsy, unspecified, not intractable, with status epilepticus: Secondary | ICD-10-CM | POA: Diagnosis not present

## 2018-01-25 DIAGNOSIS — Z452 Encounter for adjustment and management of vascular access device: Secondary | ICD-10-CM | POA: Diagnosis not present

## 2018-01-25 DIAGNOSIS — Z978 Presence of other specified devices: Secondary | ICD-10-CM | POA: Insufficient documentation

## 2018-01-25 DIAGNOSIS — J40 Bronchitis, not specified as acute or chronic: Secondary | ICD-10-CM | POA: Diagnosis not present

## 2018-01-25 DIAGNOSIS — I714 Abdominal aortic aneurysm, without rupture: Secondary | ICD-10-CM | POA: Diagnosis not present

## 2018-01-25 DIAGNOSIS — Z79899 Other long term (current) drug therapy: Secondary | ICD-10-CM | POA: Diagnosis not present

## 2018-01-25 DIAGNOSIS — Z953 Presence of xenogenic heart valve: Secondary | ICD-10-CM | POA: Diagnosis not present

## 2018-01-25 DIAGNOSIS — J9811 Atelectasis: Secondary | ICD-10-CM | POA: Diagnosis not present

## 2018-01-25 DIAGNOSIS — R4020 Unspecified coma: Secondary | ICD-10-CM | POA: Diagnosis not present

## 2018-01-25 DIAGNOSIS — Z4682 Encounter for fitting and adjustment of non-vascular catheter: Secondary | ICD-10-CM | POA: Diagnosis not present

## 2018-01-25 DIAGNOSIS — G40301 Generalized idiopathic epilepsy and epileptic syndromes, not intractable, with status epilepticus: Secondary | ICD-10-CM | POA: Diagnosis not present

## 2018-01-25 DIAGNOSIS — I10 Essential (primary) hypertension: Secondary | ICD-10-CM | POA: Diagnosis present

## 2018-01-25 DIAGNOSIS — D649 Anemia, unspecified: Secondary | ICD-10-CM | POA: Diagnosis present

## 2018-01-25 DIAGNOSIS — J9690 Respiratory failure, unspecified, unspecified whether with hypoxia or hypercapnia: Secondary | ICD-10-CM | POA: Diagnosis not present

## 2018-01-25 DIAGNOSIS — Z7982 Long term (current) use of aspirin: Secondary | ICD-10-CM | POA: Diagnosis not present

## 2018-01-25 DIAGNOSIS — Z951 Presence of aortocoronary bypass graft: Secondary | ICD-10-CM | POA: Diagnosis not present

## 2018-01-25 DIAGNOSIS — R918 Other nonspecific abnormal finding of lung field: Secondary | ICD-10-CM | POA: Diagnosis not present

## 2018-01-25 DIAGNOSIS — G40119 Localization-related (focal) (partial) symptomatic epilepsy and epileptic syndromes with simple partial seizures, intractable, without status epilepticus: Secondary | ICD-10-CM | POA: Diagnosis not present

## 2018-01-25 DIAGNOSIS — J9622 Acute and chronic respiratory failure with hypercapnia: Secondary | ICD-10-CM | POA: Diagnosis not present

## 2018-01-25 DIAGNOSIS — I69398 Other sequelae of cerebral infarction: Secondary | ICD-10-CM | POA: Diagnosis not present

## 2018-01-25 DIAGNOSIS — J449 Chronic obstructive pulmonary disease, unspecified: Secondary | ICD-10-CM | POA: Diagnosis present

## 2018-01-25 DIAGNOSIS — J9601 Acute respiratory failure with hypoxia: Secondary | ICD-10-CM | POA: Diagnosis present

## 2018-01-25 DIAGNOSIS — Z930 Tracheostomy status: Secondary | ICD-10-CM | POA: Diagnosis not present

## 2018-01-25 DIAGNOSIS — Z9981 Dependence on supplemental oxygen: Secondary | ICD-10-CM | POA: Diagnosis not present

## 2018-01-25 DIAGNOSIS — R0989 Other specified symptoms and signs involving the circulatory and respiratory systems: Secondary | ICD-10-CM | POA: Diagnosis not present

## 2018-01-25 DIAGNOSIS — J189 Pneumonia, unspecified organism: Secondary | ICD-10-CM | POA: Diagnosis not present

## 2018-01-25 DIAGNOSIS — I639 Cerebral infarction, unspecified: Secondary | ICD-10-CM | POA: Diagnosis not present

## 2018-01-25 DIAGNOSIS — R4701 Aphasia: Secondary | ICD-10-CM | POA: Diagnosis present

## 2018-01-25 DIAGNOSIS — G8192 Hemiplegia, unspecified affecting left dominant side: Secondary | ICD-10-CM | POA: Diagnosis not present

## 2018-01-25 DIAGNOSIS — J9602 Acute respiratory failure with hypercapnia: Secondary | ICD-10-CM

## 2018-01-25 DIAGNOSIS — R29818 Other symptoms and signs involving the nervous system: Secondary | ICD-10-CM | POA: Diagnosis not present

## 2018-01-25 DIAGNOSIS — R1310 Dysphagia, unspecified: Secondary | ICD-10-CM | POA: Diagnosis present

## 2018-01-25 DIAGNOSIS — G9341 Metabolic encephalopathy: Secondary | ICD-10-CM | POA: Diagnosis present

## 2018-01-25 DIAGNOSIS — Z7951 Long term (current) use of inhaled steroids: Secondary | ICD-10-CM | POA: Diagnosis not present

## 2018-01-25 DIAGNOSIS — Z87891 Personal history of nicotine dependence: Secondary | ICD-10-CM | POA: Diagnosis not present

## 2018-01-25 DIAGNOSIS — N179 Acute kidney failure, unspecified: Secondary | ICD-10-CM | POA: Diagnosis present

## 2018-01-25 DIAGNOSIS — T4275XA Adverse effect of unspecified antiepileptic and sedative-hypnotic drugs, initial encounter: Secondary | ICD-10-CM | POA: Diagnosis not present

## 2018-01-25 DIAGNOSIS — I952 Hypotension due to drugs: Secondary | ICD-10-CM | POA: Diagnosis not present

## 2018-01-25 DIAGNOSIS — I2510 Atherosclerotic heart disease of native coronary artery without angina pectoris: Secondary | ICD-10-CM | POA: Diagnosis present

## 2018-01-25 DIAGNOSIS — G40109 Localization-related (focal) (partial) symptomatic epilepsy and epileptic syndromes with simple partial seizures, not intractable, without status epilepticus: Secondary | ICD-10-CM | POA: Diagnosis not present

## 2018-01-25 DIAGNOSIS — D509 Iron deficiency anemia, unspecified: Secondary | ICD-10-CM | POA: Diagnosis present

## 2018-01-25 DIAGNOSIS — G40911 Epilepsy, unspecified, intractable, with status epilepticus: Secondary | ICD-10-CM | POA: Diagnosis present

## 2018-01-25 LAB — POCT I-STAT 3, ART BLOOD GAS (G3+)
Bicarbonate: 25.1 mmol/L (ref 20.0–28.0)
O2 SAT: 96 %
PH ART: 7.399 (ref 7.350–7.450)
Patient temperature: 98.6
TCO2: 26 mmol/L (ref 22–32)
pCO2 arterial: 40.7 mmHg (ref 32.0–48.0)
pO2, Arterial: 79 mmHg — ABNORMAL LOW (ref 83.0–108.0)

## 2018-01-25 LAB — GLUCOSE, CAPILLARY
GLUCOSE-CAPILLARY: 127 mg/dL — AB (ref 65–99)
GLUCOSE-CAPILLARY: 128 mg/dL — AB (ref 65–99)
Glucose-Capillary: 200 mg/dL — ABNORMAL HIGH (ref 65–99)
Glucose-Capillary: 102 mg/dL — ABNORMAL HIGH (ref 65–99)
Glucose-Capillary: 137 mg/dL — ABNORMAL HIGH (ref 65–99)

## 2018-01-25 LAB — BLOOD GAS, ARTERIAL
Acid-base deficit: 0.2 mmol/L (ref 0.0–2.0)
Bicarbonate: 26.3 mmol/L (ref 20.0–28.0)
DRAWN BY: 225631
DRAWN BY: 225631
FIO2: 30
LHR: 26 {breaths}/min
MECHVT: 530 mL
O2 CONTENT: 3 L/min
O2 SAT: 93.6 %
O2 Saturation: 90.7 %
PATIENT TEMPERATURE: 98.6
PCO2 ART: 55.7 mmHg — AB (ref 32.0–48.0)
PEEP: 5 cmH2O
PO2 ART: 63.6 mmHg — AB (ref 83.0–108.0)
Patient temperature: 98.6
pH, Arterial: 6.987 — CL (ref 7.350–7.450)
pH, Arterial: 7.295 — ABNORMAL LOW (ref 7.350–7.450)
pO2, Arterial: 107 mmHg (ref 83.0–108.0)

## 2018-01-25 LAB — TRIGLYCERIDES: Triglycerides: 83 mg/dL (ref <150)

## 2018-01-25 LAB — PROCALCITONIN: PROCALCITONIN: 0.17 ng/mL

## 2018-01-25 LAB — MRSA PCR SCREENING: MRSA by PCR: NEGATIVE

## 2018-01-25 MED ORDER — PHENYTOIN SODIUM 50 MG/ML IJ SOLN
100.0000 mg | Freq: Three times a day (TID) | INTRAMUSCULAR | Status: DC
  Administered 2018-01-25 (×2): 100 mg via INTRAVENOUS
  Filled 2018-01-25 (×3): qty 2

## 2018-01-25 MED ORDER — SODIUM CHLORIDE 0.9 % IV SOLN: 250.0000 mL | INTRAVENOUS | Status: DC | PRN

## 2018-01-25 MED ORDER — ETOMIDATE 2 MG/ML IV SOLN
INTRAVENOUS | Status: AC | PRN
  Administered 2018-01-25: 20 mg via INTRAVENOUS

## 2018-01-25 MED ORDER — SODIUM CHLORIDE 0.9 % IV SOLN
20.0000 mg/kg | Freq: Once | INTRAVENOUS | Status: AC
  Administered 2018-01-25: 1412 mg via INTRAVENOUS
  Filled 2018-01-25: qty 28.24

## 2018-01-25 MED ORDER — DOCUSATE SODIUM 50 MG/5ML PO LIQD: 100.0000 mg | Freq: Two times a day (BID) | ORAL | Status: DC | PRN

## 2018-01-25 MED ORDER — FENTANYL CITRATE (PF) 100 MCG/2ML IJ SOLN
50.0000 ug | INTRAMUSCULAR | Status: DC | PRN
  Administered 2018-01-25: 50 ug via INTRAVENOUS
  Filled 2018-01-25: qty 2

## 2018-01-25 MED ORDER — PHENYTOIN 50 MG PO CHEW
300.0000 mg | CHEWABLE_TABLET | Freq: Every day | ORAL | Status: DC
  Administered 2018-01-26: 300 mg via ORAL
  Filled 2018-01-25: qty 6

## 2018-01-25 MED ORDER — FENTANYL BOLUS VIA INFUSION
25.0000 ug | INTRAVENOUS | Status: DC | PRN
  Filled 2018-01-25: qty 25

## 2018-01-25 MED ORDER — SODIUM CHLORIDE 0.9 % IV SOLN
INTRAVENOUS | Status: DC
  Administered 2018-01-25 (×2): via INTRAVENOUS

## 2018-01-25 MED ORDER — ORAL CARE MOUTH RINSE
15.0000 mL | Freq: Two times a day (BID) | OROMUCOSAL | Status: DC
  Administered 2018-01-25: 15 mL via OROMUCOSAL

## 2018-01-25 MED ORDER — PROPOFOL 1000 MG/100ML IV EMUL
0.0000 ug/kg/min | INTRAVENOUS | Status: DC
  Administered 2018-01-25: 20 ug/kg/min via INTRAVENOUS

## 2018-01-25 MED ORDER — FENTANYL CITRATE (PF) 100 MCG/2ML IJ SOLN: 50.0000 ug | INTRAMUSCULAR | Status: DC | PRN

## 2018-01-25 MED ORDER — FENTANYL CITRATE (PF) 100 MCG/2ML IJ SOLN
50.0000 ug | Freq: Once | INTRAMUSCULAR | Status: AC
  Administered 2018-01-25: 50 ug via INTRAVENOUS

## 2018-01-25 MED ORDER — LEVETIRACETAM IN NACL 1000 MG/100ML IV SOLN
1000.0000 mg | Freq: Two times a day (BID) | INTRAVENOUS | Status: DC
  Administered 2018-01-25 (×2): 1000 mg via INTRAVENOUS
  Filled 2018-01-25 (×3): qty 100

## 2018-01-25 MED ORDER — PROPOFOL 1000 MG/100ML IV EMUL
INTRAVENOUS | Status: AC
  Administered 2018-01-25: 10 ug/kg/min via INTRAVENOUS
  Filled 2018-01-25: qty 100

## 2018-01-25 MED ORDER — PROPOFOL 1000 MG/100ML IV EMUL
5.0000 ug/kg/min | Freq: Once | INTRAVENOUS | Status: AC
  Administered 2018-01-25 (×3): 10 ug/kg/min via INTRAVENOUS

## 2018-01-25 MED ORDER — ORAL CARE MOUTH RINSE
15.0000 mL | OROMUCOSAL | Status: DC
  Administered 2018-01-25 (×3): 15 mL via OROMUCOSAL

## 2018-01-25 MED ORDER — FENTANYL 2500MCG IN NS 250ML (10MCG/ML) PREMIX INFUSION
25.0000 ug/h | INTRAVENOUS | Status: DC
  Administered 2018-01-25: 25 ug/h via INTRAVENOUS
  Filled 2018-01-25: qty 250

## 2018-01-25 MED ORDER — IPRATROPIUM-ALBUTEROL 0.5-2.5 (3) MG/3ML IN SOLN
3.0000 mL | Freq: Four times a day (QID) | RESPIRATORY_TRACT | Status: DC
  Administered 2018-01-25 (×4): 3 mL via RESPIRATORY_TRACT
  Filled 2018-01-25 (×3): qty 3

## 2018-01-25 MED ORDER — CHLORHEXIDINE GLUCONATE 0.12% ORAL RINSE (MEDLINE KIT)
15.0000 mL | Freq: Two times a day (BID) | OROMUCOSAL | Status: DC
  Administered 2018-01-25: 15 mL via OROMUCOSAL

## 2018-01-25 MED ORDER — HEPARIN SODIUM (PORCINE) 5000 UNIT/ML IJ SOLN
5000.0000 [IU] | Freq: Three times a day (TID) | INTRAMUSCULAR | Status: DC
  Administered 2018-01-25 – 2018-02-11 (×52): 5000 [IU] via SUBCUTANEOUS
  Filled 2018-01-25 (×53): qty 1

## 2018-01-25 MED ORDER — FAMOTIDINE IN NACL 20-0.9 MG/50ML-% IV SOLN
20.0000 mg | Freq: Two times a day (BID) | INTRAVENOUS | Status: DC
  Administered 2018-01-25 (×2): 20 mg via INTRAVENOUS
  Filled 2018-01-25 (×3): qty 50

## 2018-01-25 MED ORDER — PHENYTOIN 50 MG PO CHEW
100.0000 mg | CHEWABLE_TABLET | Freq: Once | ORAL | Status: AC
  Administered 2018-01-25: 100 mg via ORAL
  Filled 2018-01-25 (×3): qty 2

## 2018-01-25 MED ORDER — LORAZEPAM 2 MG/ML IJ SOLN: 2.0000 mg | INTRAMUSCULAR | Status: DC | PRN

## 2018-01-25 MED ORDER — LEVETIRACETAM 500 MG PO TABS
1000.0000 mg | ORAL_TABLET | Freq: Two times a day (BID) | ORAL | Status: DC
  Administered 2018-01-25 – 2018-01-26 (×3): 1000 mg via ORAL
  Filled 2018-01-25 (×4): qty 2

## 2018-01-25 MED ORDER — SUCCINYLCHOLINE CHLORIDE 20 MG/ML IJ SOLN
INTRAMUSCULAR | Status: AC | PRN
  Administered 2018-01-25: 150 mg via INTRAVENOUS

## 2018-01-25 NOTE — Progress Notes (Signed)
210ml Fentanyl wasted in sink with Jake Bathe, RN.

## 2018-01-25 NOTE — Progress Notes (Signed)
Reason for consult:   Subjective: Patient no longer having seizures.  His sedation has been weaned and he is alert and following commands.  Requesting to have tube removed.   ROS: negative except above   Examination  Vital signs in last 24 hours: Temp:  [96.8 F (36 C)-99.9 F (37.7 C)] 99.9 F (37.7 C) (05/24 1500) Pulse Rate:  [73-101] 84 (05/24 1532) Resp:  [11-27] 15 (05/24 1532) BP: (114-212)/(70-108) 151/74 (05/24 1532) SpO2:  [90 %-100 %] 96 % (05/24 1532) FiO2 (%):  [30 %-40 %] 30 % (05/24 1532) Weight:  [68.9 kg (151 lb 14.4 oz)] 68.9 kg (151 lb 14.4 oz) (05/24 0447)  General: Not in distress, cooperative, CVS: pulse-normal rate and rhythm RS: breathing comfortably,intubated Extremities: normal   Neuro: intubated MS: Alert, oriented, follows commands CN: pupils equal and reactive,  EOMI, face symmetric, Motor: 5/5 strength in all 4 extremities Coordination: normal Gait: not tested  Basic Metabolic Panel: Recent Labs  Lab 01/24/18 2313  NA 138  K 4.1  CL 101  CO2 27  GLUCOSE 206*  BUN 18  CREATININE 1.63*  CALCIUM 9.0    CBC: Recent Labs  Lab 01/24/18 2313  WBC 15.6*  NEUTROABS 11.1*  HGB 10.9*  HCT 35.7*  MCV 82.4  PLT 253     Coagulation Studies: No results for input(s): LABPROT, INR in the last 72 hours.  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 Given the report of right sided gaze with convulsion followed by post-ictal left sided gaze. Intubated for hypercarbic respirator failure    Seizure/status epilepticus  No longer having seizures R EEG shows periodic epileptiform discharges coming from the right frontotemporal region in the area of patient's old stroke Continue Dilantin, Keppra Seizure precautions Extubate when possible   Sushanth Aroor [facility] Pager Number [phone] For questions after 7pm please refer to AMION to reach the Neurologist on call

## 2018-01-25 NOTE — Progress Notes (Signed)
EEG Complete. Results pending.

## 2018-01-25 NOTE — Progress Notes (Signed)
Patient ID: Danny Krause, male   DOB: 26-Apr-1941, 77 y.o.   MRN: [id number]   Patient seen and examined.   77 year old man intubated for prolonged unresponsiveness following seizure. 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   Today:  No further seizures clinically. Neurology input appreciated. Sedation discontinued. Slow to wake up and breathe, but when fully awake became agitated requesting the tube be removed. Was able to tolerate 45 min SBT with adequate lung mechanics. Strong cough and adequate lung mechanics. Extubated successfully.  CRITICAL CARE Performed by: Kipp Brood   Total critical care time: 60 minutes  Critical care time was exclusive of separately billable procedures and treating other patients.  Critical care was necessary to treat or prevent imminent or life-threatening deterioration.  Critical care was time spent personally by me on the following activities: development of treatment plan with patient and/or surrogate as well as nursing, discussions with consultants, evaluation of patient's response to treatment, examination of patient, obtaining history from patient or surrogate, ordering and performing treatments and interventions, ordering and review of laboratory studies, ordering and review of radiographic studies, pulse oximetry and re-evaluation of patient's condition.

## 2018-01-25 NOTE — Progress Notes (Signed)
Arterial gas drawn on the following settings [id number]%+5

## 2018-01-25 NOTE — Progress Notes (Signed)
Patient with wedding ring- removed and placed in pink denture cup with patient label on it. No other personal belongings with patient. Patient son was with him at [facility] and likely has other items.   Milford Cage, RN

## 2018-01-25 NOTE — ED Notes (Signed)
ED TO INPATIENT HANDOFF REPORT  Name/Age/Gender Danny Krause 77 y.o. male  Code Status    Code Status Orders  (From admission, onward)        Start     Ordered   01/25/18 0227  Full code  Continuous     01/25/18 0228    Code Status History    Date Active Date Inactive Code Status Order ID Comments User Context   12/18/2017 2134 12/20/2017 1614 Full Code [id number]  Lars Mage, MD ED   07/04/2017 1540 07/10/2017 2033 Full Code [id number]  Arnoldo Lenis Inpatient   06/25/2017 2355 06/25/2017 1729 Full Code [id number]  Troy Sine, MD Inpatient   03/13/2017 2113 03/15/2017 1645 Full Code [id number]  Etta Quill, DO ED      Home/SNF/Other Home  Chief Complaint seizure  Level of Care/Admitting Diagnosis ED Disposition    ED Disposition Condition [place] [100100]  Level of Care: ICU [6]  Diagnosis: Acute respiratory failure with hypercapnia [facility]) [[id number]]  Admitting Physician: Kandice Hams [[id number]]  Attending Physician: Kandice Hams [[id number]]  Estimated length of stay: 3 - 4 days  Certification:: I certify this patient will need inpatient services for at least 2 midnights  Bed request comments: 4N  PT Class (Do Not Modify): Inpatient [101]  PT Acc Code (Do Not Modify): Private [1]       Medical History Past Medical History:  Diagnosis Date  . Anginal pain ([place])    with exertion  . Coronary artery disease   . Critical aortic valve stenosis   . Dyspnea    with exertion  . Murmur   . Seizure ([place])   . Syncope     Allergies No Known Allergies  IV Location/Drains/Wounds Patient Lines/Drains/Airways Status   Active Line/Drains/Airways    Name:   Placement date:   Placement time:   Site:   Days:   Peripheral IV 01/24/18 Right Antecubital   01/24/18    -    Antecubital   1   Peripheral IV 01/24/18 Left Antecubital   01/24/18    2320    Antecubital   1   NG/OG Tube  Orogastric 16 Fr. Left mouth Aucultation Measured external length of tube   01/25/18    0154    Left mouth   less than 1   Urethral Catheter Terri Doster RN Temperature probe 16 Fr.   01/25/18    0210    Temperature probe   less than 1   Airway 7.5 mm   01/25/18    0148     less than 1          Labs/Imaging Results for orders placed or performed during the hospital encounter of 01/24/18 (from the past 48 hour(s))  CBG monitoring, ED     Status: Abnormal   Collection Time: 01/24/18 11:11 PM  Result Value Ref Range   Glucose-Capillary 177 (H) 65 - 99 mg/dL  Comprehensive metabolic panel     Status: Abnormal   Collection Time: 01/24/18 11:13 PM  Result Value Ref Range   Sodium 138 135 - 145 mmol/L   Potassium 4.1 3.5 - 5.1 mmol/L   Chloride 101 101 - 111 mmol/L   CO2 27 22 - 32 mmol/L   Glucose, Bld 206 (H) 65 - 99 mg/dL   BUN 18 6 - 20 mg/dL   Creatinine, Ser 1.63 (H) 0.61 - 1.24 mg/dL  Calcium 9.0 8.9 - 10.3 mg/dL   Total Protein 8.1 6.5 - 8.1 g/dL   Albumin 4.4 3.5 - 5.0 g/dL   AST 33 15 - 41 U/L   ALT 21 17 - 63 U/L   Alkaline Phosphatase 65 38 - 126 U/L   Total Bilirubin 0.2 (L) 0.3 - 1.2 mg/dL   GFR calc non Af Amer 39 (L) >60 mL/min   GFR calc Af Amer 45 (L) >60 mL/min    Comment: (NOTE) The eGFR has been calculated using the CKD EPI equation. This calculation has not been validated in all clinical situations. eGFR's persistently <60 mL/min signify possible Chronic Kidney Disease.    Anion gap 10 5 - 15    Comment: Performed at [facility], [place] [address], [place], [place] [address]  CBC with Differential     Status: Abnormal   Collection Time: 01/24/18 11:13 PM  Result Value Ref Range   WBC 15.6 (H) 4.0 - 10.5 K/uL   RBC 4.33 4.22 - 5.81 MIL/uL   Hemoglobin 10.9 (L) 13.0 - 17.0 g/dL   HCT 35.7 (L) 39.0 - 52.0 %   MCV 82.4 78.0 - 100.0 fL   MCH 25.2 (L) 26.0 - 34.0 pg   MCHC 30.5 30.0 - 36.0 g/dL   RDW 15.0 11.5 - 15.5 %   Platelets 253 150  - 400 K/uL   Neutrophils Relative % 71 %   Neutro Abs 11.1 (H) 1.7 - 7.7 K/uL   Lymphocytes Relative 19 %   Lymphs Abs 3.0 0.7 - 4.0 K/uL   Monocytes Relative 5 %   Monocytes Absolute 0.8 0.1 - 1.0 K/uL   Eosinophils Relative 5 %   Eosinophils Absolute 0.7 0.0 - 0.7 K/uL   Basophils Relative 0 %   Basophils Absolute 0.0 0.0 - 0.1 K/uL    Comment: Performed at [facility], [place] [address], [place], [place] [address]  Ethanol     Status: None   Collection Time: 01/24/18 11:13 PM  Result Value Ref Range   Alcohol, Ethyl (B) <10 <10 mg/dL    Comment: (NOTE) Lowest detectable limit for serum alcohol is 10 mg/dL. For medical purposes only. Performed at [facility], [place] [address] [place], [place] [address]   Blood gas, arterial     Status: Abnormal   Collection Time: 01/25/18  1:20 AM  Result Value Ref Range   O2 Content 3.0 L/min   Delivery systems NASAL CANNULA    pH, Arterial 6.987 (LL) 7.350 - 7.450    Comment: CRITICAL RESULT CALLED TO, READ BACK BY AND VERIFIED WITH: DR Stark Jock AT 0125 BY PATRICK SWEENEY, RRT,RCP ON 01/25/18    pCO2 arterial VALUE ABOVE REPORTABLE RANGE 32.0 - 48.0 mmHg    Comment: CRITICAL RESULT CALLED TO, READ BACK BY AND VERIFIED WITH: DR Stark Jock AT 0125 BY PATRICK SWEENEY, RRT,RCP ON 01/25/18    pO2, Arterial 107 83.0 - 108.0 mmHg   O2 Saturation 93.6 %   Patient temperature 98.6    Collection site LEFT RADIAL    Drawn by [id number]    Sample type ARTERIAL    Allens test (pass/fail) PASS PASS    Comment: Performed at [facility], [name] [address] [place], [place] [address]   Ct Head Wo Contrast  Result Date: 01/25/2018 CLINICAL DATA:  Altered LOC EXAM: CT HEAD WITHOUT CONTRAST TECHNIQUE: Contiguous axial images were obtained from the base of the skull through the vertex without intravenous  contrast. COMPARISON:  None. FINDINGS: Brain: No acute territorial infarction, hemorrhage or intracranial mass.  Mild small vessel ischemic changes of the white matter. Small focus of encephalomalacia in the right frontal lobe consistent with old infarct. Mild atrophy. Nonenlarged ventricles. Vascular: No hyperdense vessels.  Carotid vascular calcification Skull: Normal. Negative for fracture or focal lesion. Sinuses/Orbits: Mucosal thickening in the ethmoid sinuses. No acute orbital abnormality. Other: None IMPRESSION: 1. No CT evidence for acute intracranial abnormality. 2. Atrophy and small vessel ischemic changes of the white matter. Old right frontal lobe infarct Electronically Signed   By: Donavan Foil M.D.   On: 01/25/2018 00:33   Dg Chest Portable 1 View  Result Date: 01/25/2018 CLINICAL DATA:  Post intubation. EXAM: PORTABLE CHEST 1 VIEW COMPARISON:  Chest radiograph August 21, 2017 FINDINGS: Endotracheal tube tip projects 2 cm above the carina. Nasogastric tube side port at GE junction region, distal tip out of field-of-view. Stable cardiomegaly. Status post median sternotomy for cardiac valve replacement. Mildly calcified aortic arch. Pulmonary vascular congestion without pleural effusion or focal consolidation. No pneumothorax. Soft tissue planes included osseous structures are nonsuspicious. IMPRESSION: Endotracheal tube tip projects 2 cm above the carina. Nasogastric tube side port at GE junction region, distal tip out of field-of-view. Stable cardiomegaly with pulmonary vascular congestion. Aortic Atherosclerosis (ICD10-I70.0). Electronically Signed   By: Elon Alas M.D.   On: 01/25/2018 02:36   Dg Abd Portable 1 View  Result Date: 01/25/2018 CLINICAL DATA:  Orogastric tube placement. EXAM: PORTABLE ABDOMEN - 1 VIEW COMPARISON:  None. FINDINGS: Side port projecting at GE junction region, distal tip in proximal stomach region. Included bowel gas pattern is nondilated and nonobstructive. No intra-abdominal mass effect or pathologic calcifications. Soft tissue planes and included osseous  structures are nonsuspicious. IMPRESSION: Nasogastric tube tip projects in proximal stomach. Electronically Signed   By: Elon Alas M.D.   On: 01/25/2018 02:37    Pending Labs Unresulted Labs (From admission, onward)   Start     Ordered   01/25/18 0600  Blood gas, arterial  Tomorrow morning,   R     01/25/18 0228   01/25/18 0500  CBC  Tomorrow morning,   R     01/25/18 0228   01/25/18 8299  Basic metabolic panel  Tomorrow morning,   R     01/25/18 0228   01/25/18 0500  Magnesium  Tomorrow morning,   R     01/25/18 0228   01/25/18 0500  Phosphorus  Tomorrow morning,   R     01/25/18 0228   01/25/18 0300  Blood gas, arterial  Once,   R     01/25/18 0228      Vitals/Pain Today's Vitals   01/25/18 0200 01/25/18 0215 01/25/18 0245 01/25/18 0246  BP: (!) 141/88 115/86 127/75   Pulse: 83 82 76 78  Resp: (!) 26 (!) 21 (!) 26 20  Temp:  (!) 96.8 F (36 C) (!) 97.2 F (36.2 C) (!) 97.2 F (36.2 C)  SpO2: 98% 99% 99% 99%  Height:        Isolation Precautions No active isolations  Medications Medications  levETIRAcetam (KEPPRA) IVPB 1000 mg/100 mL premix (0 mg Intravenous Stopped 01/25/18 0308)  phenytoin (DILANTIN) injection 100 mg (has no administration in time range)  0.9 %  sodium chloride infusion (has no administration in time range)  heparin injection 5,000 Units (has no administration in time range)  famotidine (PEPCID) IVPB 20 mg premix (20 mg Intravenous New Bag/Given  01/25/18 0312)  0.9 %  sodium chloride infusion ( Intravenous New Bag/Given 01/25/18 0248)  ipratropium-albuterol (DUONEB) 0.5-2.5 (3) MG/3ML nebulizer solution 3 mL (3 mLs Nebulization Given 01/25/18 0246)  fentaNYL (SUBLIMAZE) injection 50 mcg (50 mcg Intravenous Given 01/25/18 0258)  fentaNYL (SUBLIMAZE) injection 50 mcg (has no administration in time range)  LORazepam (ATIVAN) injection 2 mg (has no administration in time range)  LORazepam (ATIVAN) injection 1 mg (1 mg Intravenous Given 01/24/18  2317)  fosPHENYtoin (CEREBYX) 1,412 mg PE in sodium chloride 0.9 % 50 mL IVPB (0 mg PE/kg  70.6 kg Intravenous Stopped 01/25/18 0143)  etomidate (AMIDATE) injection (20 mg Intravenous Given 01/25/18 0143)  succinylcholine (ANECTINE) injection (150 mg Intravenous Given 01/25/18 0144)  propofol (DIPRIVAN) 1000 MG/100ML infusion (10 mcg/kg/min  70.6 kg Intravenous Bolus from Bag 01/25/18 0208)    Mobility non-ambulatory currently

## 2018-01-25 NOTE — Procedures (Signed)
Extubation Procedure Note  Patient Details:   Name: Danny Krause DOB: August 10, 1941 MRN: [id number]   Airway Documentation:   Patient extubated per order. Patient had positive cuff leak prior to extubation. Placed on 4 lpm humidified oxygen. No stridor or dyspnea noted at this time. Patient has strong cough and able to voice. Vent end date: 01/25/18 Vent end time: 1604   Evaluation  O2 sats: stable throughout Complications: No apparent complications Patient did tolerate procedure well. Bilateral Breath Sounds: Diminished, Clear   Yes  Ander Purpura 01/25/2018, 4:05 PM

## 2018-01-25 NOTE — Consult Note (Signed)
Neurology Consultation Reason for Consult: Seizure Referring Physician: Stark Jock, D  CC: Seizure  History is obtained from: Son  HPI: Danny Krause is a 77 y.o. male with a hisory of new onset seizures that started in July 2018. He was found down at that time with question of seizure vs concussion. A RIGHT-ward gaze was described at that time. He had a very prolonged post-ictal state.   He was then seizure free until last month at which time he had LEFT gaze deviation and there was concern for ongoing seizure. He was intubated at that time and STAT EEG did not capture any seizure activity(though gaze deviation had stopped at that point).   Tonight, he was not feeling well and complained of some nausea confused at home and his son went to check on him. He then had a brief seizure with twitching(accompanied by RIGHT-ward gaze). EMS administered versed at which point the twitching stopped and he began having LEFT-ward gaze. HE was still responsive to noxious stimulation on arrival, but due to persistent gaze deviation he was given 2mg  IV ativan. His mental status continued to decline, but no further twitching and his gaze returned to midline.   ABG reveals Ph 6.9 with unreadably high paCO2(at baseline was normal on 5/15 performed following his PCCM follow up visit).   ROS:  Unable to obtain due to altered mental status.   Past Medical History:  Diagnosis Date  . Anginal pain ([place])    with exertion  . Coronary artery disease   . Critical aortic valve stenosis   . Dyspnea    with exertion  . Murmur   . Seizure ([place])   . Syncope      Family History  Problem Relation Age of Onset  . Hernia Father   . Stroke Brother      Social History:  reports that he quit smoking about 6 months ago. His smoking use included cigarettes. He has a 128.00 pack-year smoking history. He has never used smokeless tobacco. He reports that he does not drink alcohol or use drugs.   Exam: Current vital  signs: BP (!) 192/95   Pulse 92   Resp 14   SpO2 93%  Vital signs in last 24 hours: Pulse Rate:  [65-101] 92 (05/24 0115) Resp:  [13-27] 14 (05/24 0115) BP: (108-212)/(70-108) 192/95 (05/24 0115) SpO2:  [90 %-95 %] 93 % (05/24 0115) Weight:  [70.6 kg (155 lb 9.6 oz)] 70.6 kg (155 lb 9.6 oz) (05/23 0902)   Physical Exam  Constitutional: Appears well-developed and well-nourished.  Psych: unresponsive Eyes: No scleral injection HENT: No OP obstrucion Head: Normocephalic.  Cardiovascular: Normal rate and regular rhythm.  Respiratory: non-labored breathing GI: Soft.  No distension. There is no tenderness.  Skin: WDI  Neuro: Mental Status: Patient is unarousable. He does not open eyes or follow commands.  Cranial Nerves: II: Does not blink to threat. Pupils are 60mm, round, and reactive to light.   III,IV, VI: Eyes are conjugate, midline, cross midline bilaterally with oculocephalic maneuver V: VII: corneals intact Motor: Tone is flaccid. No movement to noxious stimulation.  Sensory: As above Cerebellar: Does not perform.   I have reviewed labs in epic and the results pertinent to this consultation are: CMP AKI with Cr of 1.63(0.95 one week ago).   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 Given the report of right sided gaze with convulsion followed by post-ictal  left sided gaze, I think that this was likely a todd's phenomenon rather than ongoing seizure. No signs of ongoing seizure currently and I suspect that his mental status is due currently to severe hypercarbia.   He is currently being intubated for hypercarbic respiratory failure by the ED.   Recommendations: 1) Continue keppra 1g BID 2) continue phenytoin 100mg  TID.  3) EEG 4) Would favor [facility] admission in case of need for LTM  Roland Rack, MD [facility] [phone]  If 7pm- 7am, please page neurology on call as  listed in [place].

## 2018-01-25 NOTE — Procedures (Signed)
ELECTROENCEPHALOGRAM REPORT  Date of Study: 01/25/2018  Patient's Name: Danny Krause MRN: [id number] Date of Birth: [birth year]  Referring Provider: Dr. Roland Rack  Clinical History: This is a 77 year old man with witnessed seizure, left-gaze deviation, continued altered mental status without twitching/gaze deviation  Medications: Keppra Dilantin Fentanyl Propofol  Technical Summary: A multichannel digital EEG recording measured by the international 10-20 system with electrodes applied with paste and impedances below 5000 ohms performed as portable with EKG monitoring in an intubated and sedated patient.  Hyperventilation and photic stimulation were not performed.  The digital EEG was referentially recorded, reformatted, and digitally filtered in a variety of bipolar and referential montages for optimal display.   Description: The patient is intubated and sedated on Propofol and Fentanyl during the recording. He is noted to intermittently follow commands when alerted. There is no clear posterior dominant rhythm. The background consists of a moderate amount of diffuse 4-5 Hz theta and occasional diffuse 2-3 Hz delta slowing admixed with alpha and beta frequencies. Lateralized periodic discharges are seen over the right frontotemporal region every 1-2 seconds. There is a 5-second period where rhythmic 4 Hz theta activity is seen on the right temporal region without evolution in frequency or amplitude. Normal sleep architecture was not seen. Hyperventilation and photic stimulation were not performed.   EKG lead was unremarkable.  Impression: This sedated EEG is abnormal due to the presence of: 1. Moderate diffuse background slowing 2. Lateralized periodic discharges (LPDs) over the right frontotemporal region 3. Brief 5-second period of rhythmic theta activity over the right temporal region without evolution  Clinical Correlation of the above findings indicates diffuse  cerebral dysfunction that is non-specific in etiology and can be seen with hypoxic/ischemic injury, toxic/metabolic encephalopathies, or medication effect. Although lateralized periodic discharges do not represent ongoing seizure activity, they are commonly associated with an acute brain lesion and clinical focal seizures, or post-ictal after focal status epilepticus. Clinical correlation is advised.   Ellouise Newer, M.D.

## 2018-01-25 NOTE — H&P (Signed)
.. ..  Name: Danny Krause MRN: [id number] DOB: 1941-03-07    ADMISSION DATE:  01/24/2018 CONSULTATION DATE:  01/25/18  REFERRING MD :  Maudie Mercury MD  CHIEF COMPLAINT:  seizures  BRIEF PATIENT DESCRIPTION: 77 yr old with PMHx COPD, CAD, s/p CABG and AVR was hospitalized in April for seizure and respiratory failure and was intubated at that time. Presents on 5/23   SIGNIFICANT EVENTS  Seizure resp failure requiring intubation   STUDIES:  ABG: 6.9/pCO2 too high to report PFT on 06/2017 : FEV1 1.41L (53% pre), DLCO 8.35 ml (29%)    HISTORY OF PRESENT ILLNESS:  (obtained from patient's son, other providers and EMR due to pt being encephalopathic- post ictal and now intubated)  77 yr old with PMHx COPD( 41 pack yr h/o not actively smoking), CAD, s/p CABG and AVR was hospitalized in April for seizure and respiratory failure and was intubated at that time. He was evaluated with EEG on that admission and it was negative for status. He was discharged on 12/20/17 on Keppra 1g BID.    Pt was seen by cardiology on 4/24 was HTN on that visit but otherwise free of complaints. Was seen by Pulmonary on 5/9 and again on 5/23. He was scheduled to have a sleep study on June 5th 2019 to evaluate for sleep apnea. Per patient's son he uses home oxygen mostly at night during sleep and when travelling in the car but will not put it on when walking around, despite frequent coughing spells.  He was prescribed Symbicort and albuterol and received samples of symbicort. He has been caring for his wife at home after her recent amputation.  Per the son the patient has had no recent episodes of syncope, not complaining of fevers, chest pain, headaches, dizziness, or falls. No reported seizures since his discharge on 12/20/17.  Per Triage and ED RN notes: Pt presented from home was found by his son around 2130 not responding normally, it was unclear if he had a seizure at that time. EMS was called. EMS reports a GCS 8 pt  sitting on the toilt with upward gze and ridgidity. He received 2.5 mg Versed and his symptoms subsided. He had an episode of urinary incontinence per their reports.  Postictal unresponsive on arrival to [facility]. AMS felt to be status and noted a "leftward eye deviation and was minimally responsive.  He would grunt when stimulated, but made no attempts to follow commands or speak.  He was given additional Ativan and observed for period of time, however his situation did not improve.  Due to his recent admission with hypercarbic respiratory failure, an ABG was obtained.  This revealed a pH of 6.95 with PCO2 greater than 120 reflective of a profound respiratory acidosis."   EDP intubated using Etomidate and Succinylcholine- 7.5 ETT.  PCCM consulted for admission.   PAST MEDICAL HISTORY :   has a past medical history of Anginal pain ([place]), Coronary artery disease, Critical aortic valve stenosis, Dyspnea, Murmur, Seizure ([place]), and Syncope.  has a past surgical history that includes No past surgeries; RIGHT/LEFT HEART CATH AND CORONARY ANGIOGRAPHY (N/A, 06/25/2017); THORACIC AORTOGRAM (06/25/2017); Aortic valve replacement (N/A, 07/04/2017); Coronary artery bypass graft (N/A, 07/04/2017); and TEE without cardioversion (N/A, 07/04/2017). Prior to Admission medications   Medication Sig Start Date End Date Taking? Authorizing Provider  acetaminophen (TYLENOL) 650 MG CR tablet Take 650 mg by mouth every 8 (eight) hours as needed for pain.    [provider]  albuterol (  PROAIR HFA) 108 (90 Base) MCG/ACT inhaler Inhale 2 puffs into the lungs every 6 (six) hours as needed for wheezing or shortness of breath. 01/10/18   Juanito Doom, MD  ASPIRIN 81 PO Take by mouth daily.    [provider]  budesonide-formoterol (SYMBICORT) 160-4.5 MCG/ACT inhaler Inhale 2 puffs into the lungs 2 (two) times daily. 01/10/18   Juanito Doom, MD  levETIRAcetam (KEPPRA) 500 MG tablet Take 2 tablets (1,000 mg  total) by mouth 2 (two) times daily. 12/20/17   Samuella Cota, MD  metoprolol tartrate (LOPRESSOR) 25 MG tablet TAKE 1/2 TABLET BY MOUTH TWICE A DAY 12/05/17   Hilty, Nadean Corwin, MD  olmesartan (BENICAR) 40 MG tablet Take 1 tablet (40 mg total) by mouth daily. 12/26/17   Hilty, Nadean Corwin, MD   No Known Allergies  FAMILY HISTORY:  family history includes Hernia in his father; Stroke in his brother. SOCIAL HISTORY:  reports that he quit smoking about 6 months ago. His smoking use included cigarettes. He has a 128.00 pack-year smoking history. He has never used smokeless tobacco. He reports that he does not drink alcohol or use drugs.  REVIEW OF SYSTEMS:  (Unable to obtain due to encephalopathy pt is post ictal and intubated) Constitutional: Negative for fever, chills, weight loss, malaise/fatigue and diaphoresis.  HENT: Negative for hearing loss, ear pain, nosebleeds, congestion, sore throat, neck pain, tinnitus and ear discharge.   Eyes: Negative for blurred vision, double vision, photophobia, pain, discharge and redness.  Respiratory: Negative for cough, hemoptysis, sputum production, shortness of breath, wheezing and stridor.   Cardiovascular: Negative for chest pain, palpitations, orthopnea, claudication, leg swelling and PND.  Gastrointestinal: Negative for heartburn, nausea, vomiting, abdominal pain, diarrhea, constipation, blood in stool and melena.  Genitourinary: Negative for dysuria, urgency, frequency, hematuria and flank pain.  Musculoskeletal: Negative for myalgias, back pain, joint pain and falls.  Skin: Negative for itching and rash.  Neurological: Negative for dizziness, tingling, tremors, sensory change, speech change, focal weakness, seizures, loss of consciousness, weakness and headaches.  Endo/Heme/Allergies: Negative for environmental allergies and polydipsia. Does not bruise/bleed easily.  SUBJECTIVE:   VITAL SIGNS: Pulse Rate:  [65-101] 92 (05/24 0115) Resp:  [13-27]  14 (05/24 0115) BP: (108-212)/(70-108) 192/95 (05/24 0115) SpO2:  [90 %-100 %] 100 % (05/24 0148) FiO2 (%):  [40 %] 40 % (05/24 0148) Weight:  [70.6 kg (155 lb 9.6 oz)] 70.6 kg (155 lb 9.6 oz) (05/23 0902)  PHYSICAL EXAMINATION: General:  Sedated, intubated Neuro:  Pinpoint pupils, + gag, withdraws from pain HEENT:  NCAT ETT in oropharynx Cardiovascular:  S1 and S2 appreciated Lungs:  Ventilator associated breath sounds, decreased at bases Abdomen:  Soft, not distended + BS in all 4 quadrants Musculoskeletal:  No gross deformities, trace edema in LE Skin:  Grossly intact  Recent Labs  Lab 01/24/18 2313  NA 138  K 4.1  CL 101  CO2 27  BUN 18  CREATININE 1.63*  GLUCOSE 206*   Recent Labs  Lab 01/24/18 2313  HGB 10.9*  HCT 35.7*  WBC 15.6*  PLT 253   Ct Head Wo Contrast  Result Date: 01/25/2018 CLINICAL DATA:  Altered LOC EXAM: CT HEAD WITHOUT CONTRAST TECHNIQUE: Contiguous axial images were obtained from the base of the skull through the vertex without intravenous contrast. COMPARISON:  None. FINDINGS: Brain: No acute territorial infarction, hemorrhage or intracranial mass. Mild small vessel ischemic changes of the white matter. Small focus of encephalomalacia in the right frontal  lobe consistent with old infarct. Mild atrophy. Nonenlarged ventricles. Vascular: No hyperdense vessels.  Carotid vascular calcification Skull: Normal. Negative for fracture or focal lesion. Sinuses/Orbits: Mucosal thickening in the ethmoid sinuses. No acute orbital abnormality. Other: None IMPRESSION: 1. No CT evidence for acute intracranial abnormality. 2. Atrophy and small vessel ischemic changes of the white matter. Old right frontal lobe infarct Electronically Signed   By: Donavan Foil M.D.   On: 01/25/2018 00:33    ASSESSMENT / PLAN: NEURO: Seizures in the setting of severe acidosis Recurrent Status Epilepticus CTH negative for any acute process Plan: Sedation - RASS goal -1 Neurology  following appreciate recommendations Possibly Todd's Will continue Keppra 1 gram BID  And Phenytoin 100mg  TID EEG Pt will be transferred to [facility] based on Neurology's recommendations   CARDIAC: H/o AVR s/p CABG Plan: Had a recent ECHO with EF 98-92% Grade 1 diastolic dysfunction Aortic valve was w/o regurg or stenosis at this time MAP goal.55mmHg Continue Cardiac monitoring EKG on presentation QRS 199ms and QTc 446 ( improved from prior in April)   PULMONARY: Acute hypercapnic respiratory failure requiring intubation and mechanical ventilation H/o pulmonary nodules + tobacco use Plan:  TV 8cc/kg VAP Bronchodilators, ICD, LAMA Reviewed CXR post intubation F/u ABG and adjust Minute ventilation- goal ph >7.3 Titrate FiO2 to keep O2 sat >92%  ID: Leukocytosis noted on CBC No fevers Plan: Low suspicion for infection CXR shows no consolidation or new infiltrate Send PCT baseline Start on Azithromycin given h/o COPD  Endocrine: No acute issues No H/o insulin dependence Received etomidate for RSI Plan: If pt becomes hypotensive Check cortisol   GI: H/o Nausea. No episodes of emesis Plan: NPO Place OGT- confirm placement with DG Abd No evidence if aspiration on CXR Start on Pepcid for GI PPx  Heme: Microcytic Hypochromic Anemia  Plan: Check iron studied including: ferritin, TIBC If Hgb <8  will transfuse PRBCs .Marland KitchenA POS No signs of  active bleeding DVT PPx->SCDs and Heparin   RENAL Acute Kidney Injury Lab Results  Component Value Date   CREATININE 1.63 (H) 01/24/2018   CREATININE 0.95 12/20/2017   CREATININE 1.08 12/19/2017  Plan: Avoid nephrotoxic agents Continue IVF Monitor UOP Insert indwelling Foley catheter Repeat CMP in AM  Replace electrolytes as needed    I, Dr Seward Carol have personally reviewed patient's available data, including medical history, events of note, physical examination and test results as part of my evaluation. I have  discussed with NP Heber [place]ola and other care providers such as pharmacist, RN and Elink. The patient is critically ill with multiple organ systems failure and requires high complexity decision making for assessment and support, frequent evaluation and titration of therapies, application of advanced monitoring technologies and extensive interpretation of multiple databases. Critical Care Time devoted to patient care services described in this note is 69 Minutes. This time reflects time of care of this signee Dr Seward Carol. This critical care time does not reflect procedure time, or teaching time or supervisory time ofNP Hoffman but could involve care discussion time    DISPOSITION: [facility] CC TIME: 52 mins PROGNOSIS:Guarded CODE STATUS: Full FAMILY:    Signed Dr Seward Carol Pulmonary Critical Care Locums  01/25/2018, 2:13 AM

## 2018-01-25 NOTE — ED Notes (Signed)
Neurologist at bedside. 

## 2018-01-26 DIAGNOSIS — J9601 Acute respiratory failure with hypoxia: Secondary | ICD-10-CM

## 2018-01-26 LAB — CBC WITH DIFFERENTIAL/PLATELET
ABS IMMATURE GRANULOCYTES: 0.1 10*3/uL (ref 0.0–0.1)
BASOS ABS: 0 10*3/uL (ref 0.0–0.1)
BASOS PCT: 0 %
EOS ABS: 0.1 10*3/uL (ref 0.0–0.7)
EOS PCT: 1 %
HCT: 32.9 % — ABNORMAL LOW (ref 39.0–52.0)
Hemoglobin: 9.6 g/dL — ABNORMAL LOW (ref 13.0–17.0)
Immature Granulocytes: 1 %
Lymphocytes Relative: 11 %
Lymphs Abs: 0.9 10*3/uL (ref 0.7–4.0)
MCH: 24.2 pg — AB (ref 26.0–34.0)
MCHC: 29.2 g/dL — AB (ref 30.0–36.0)
MCV: 82.9 fL (ref 78.0–100.0)
MONOS PCT: 9 %
Monocytes Absolute: 0.8 10*3/uL (ref 0.1–1.0)
NEUTROS PCT: 80 %
Neutro Abs: 7.1 10*3/uL (ref 1.7–7.7)
PLATELETS: 186 10*3/uL (ref 150–400)
RBC: 3.97 MIL/uL — ABNORMAL LOW (ref 4.22–5.81)
RDW: 14.9 % (ref 11.5–15.5)
WBC: 8.9 10*3/uL (ref 4.0–10.5)

## 2018-01-26 LAB — BASIC METABOLIC PANEL
Anion gap: 8 (ref 5–15)
BUN: 16 mg/dL (ref 6–20)
CALCIUM: 8.9 mg/dL (ref 8.9–10.3)
CO2: 27 mmol/L (ref 22–32)
CREATININE: 0.97 mg/dL (ref 0.61–1.24)
Chloride: 102 mmol/L (ref 101–111)
GFR calc non Af Amer: >60 mL/min (ref >60)
Glucose, Bld: 120 mg/dL — ABNORMAL HIGH (ref 65–99)
Potassium: 3.8 mmol/L (ref 3.5–5.1)
SODIUM: 137 mmol/L (ref 135–145)

## 2018-01-26 LAB — PROCALCITONIN: PROCALCITONIN: 0.25 ng/mL

## 2018-01-26 MED ORDER — QUETIAPINE FUMARATE 50 MG PO TABS
50.0000 mg | ORAL_TABLET | Freq: Every day | ORAL | Status: DC
  Administered 2018-01-26: 50 mg via ORAL
  Filled 2018-01-26: qty 1

## 2018-01-26 MED ORDER — IPRATROPIUM-ALBUTEROL 0.5-2.5 (3) MG/3ML IN SOLN
3.0000 mL | Freq: Two times a day (BID) | RESPIRATORY_TRACT | Status: DC
  Administered 2018-01-26 (×2): 3 mL via RESPIRATORY_TRACT
  Filled 2018-01-26 (×2): qty 3

## 2018-01-26 MED ORDER — LORAZEPAM 2 MG/ML IJ SOLN
2.0000 mg | Freq: Once | INTRAMUSCULAR | Status: AC
  Administered 2018-01-26: 2 mg via INTRAVENOUS

## 2018-01-26 MED ORDER — ALBUTEROL SULFATE (2.5 MG/3ML) 0.083% IN NEBU: 3.0000 mL | INHALATION_SOLUTION | Freq: Four times a day (QID) | RESPIRATORY_TRACT | Status: DC | PRN

## 2018-01-26 MED ORDER — HALOPERIDOL 5 MG PO TABS
5.0000 mg | ORAL_TABLET | Freq: Once | ORAL | Status: DC
  Filled 2018-01-26: qty 1

## 2018-01-26 MED ORDER — ASPIRIN 325 MG PO TABS
325.0000 mg | ORAL_TABLET | Freq: Every day | ORAL | Status: DC
  Administered 2018-01-26 – 2018-02-05 (×10): 325 mg via ORAL
  Filled 2018-01-26 (×13): qty 1

## 2018-01-26 MED ORDER — HALOPERIDOL LACTATE 5 MG/ML IJ SOLN
2.0000 mg | Freq: Once | INTRAMUSCULAR | Status: AC
  Administered 2018-01-26: 2 mg via INTRAVENOUS
  Filled 2018-01-26: qty 1

## 2018-01-26 MED ORDER — MOMETASONE FURO-FORMOTEROL FUM 200-5 MCG/ACT IN AERO
2.0000 | INHALATION_SPRAY | Freq: Two times a day (BID) | RESPIRATORY_TRACT | Status: DC
  Administered 2018-01-26 – 2018-01-27 (×3): 2 via RESPIRATORY_TRACT
  Filled 2018-01-26: qty 8.8

## 2018-01-26 MED ORDER — HALOPERIDOL LACTATE 2 MG/ML PO CONC
5.0000 mg | Freq: Once | ORAL | Status: AC
  Administered 2018-01-26: 5 mg via ORAL
  Filled 2018-01-26: qty 2.5

## 2018-01-26 MED ORDER — IRBESARTAN 150 MG PO TABS
300.0000 mg | ORAL_TABLET | Freq: Every day | ORAL | Status: DC
  Administered 2018-01-26 – 2018-01-29 (×3): 300 mg via ORAL
  Filled 2018-01-26: qty 2
  Filled 2018-01-26 (×3): qty 1

## 2018-01-26 MED ORDER — PHENYTOIN SODIUM EXTENDED 100 MG PO CAPS
300.0000 mg | ORAL_CAPSULE | Freq: Every day | ORAL | Status: DC
  Filled 2018-01-26: qty 3

## 2018-01-26 MED ORDER — LORAZEPAM 2 MG/ML IJ SOLN
INTRAMUSCULAR | Status: AC
  Administered 2018-01-26: 2 mg via INTRAVENOUS
  Filled 2018-01-26: qty 1

## 2018-01-26 NOTE — Progress Notes (Signed)
Patient transferred to 3W37 Stable but confused. Son made aware of new location

## 2018-01-26 NOTE — Progress Notes (Signed)
PULMONARY / CRITICAL CARE MEDICINE   Name: Danny Krause MRN: [id number] DOB: 02/26/1941    ADMISSION DATE:  01/24/2018 CONSULTATION DATE:  01/24/2018  REFERRING MD:  Maudie Mercury  CHIEF COMPLAINT:  Seizures  HISTORY OF PRESENT ILLNESS:   77 year old man with prior history of seizures on background of prior CVA on home oxygen mainly at night.  First diagnosed with seizures in 12/2017 and discharged home on Keppra. Questionable compliance at home.  Found unresponsive at home with seizure like activity.  Intubated in ED for hypercapneic respiratory failure.   PAST MEDICAL HISTORY :  He  has a past medical history of Anginal pain ([place]), Coronary artery disease, Critical aortic valve stenosis, Dyspnea, Murmur, Seizure ([place]), and Syncope.  PAST SURGICAL HISTORY: He  has a past surgical history that includes No past surgeries; RIGHT/LEFT HEART CATH AND CORONARY ANGIOGRAPHY (N/A, 06/25/2017); THORACIC AORTOGRAM (06/25/2017); Aortic valve replacement (N/A, 07/04/2017); Coronary artery bypass graft (N/A, 07/04/2017); and TEE without cardioversion (N/A, 07/04/2017).  No Known Allergies  No current facility-administered medications on file prior to encounter.    Current Outpatient Medications on File Prior to Encounter  Medication Sig  . acetaminophen (TYLENOL) 650 MG CR tablet Take 650 mg by mouth every 8 (eight) hours as needed for pain.  . budesonide-formoterol (SYMBICORT) 160-4.5 MCG/ACT inhaler Inhale 2 puffs into the lungs 2 (two) times daily. (Patient taking differently: Inhale 2 puffs into the lungs daily. )  . levETIRAcetam (KEPPRA) 500 MG tablet Take 2 tablets (1,000 mg total) by mouth 2 (two) times daily.  . metoprolol tartrate (LOPRESSOR) 25 MG tablet TAKE 1/2 TABLET BY MOUTH TWICE A DAY  . olmesartan (BENICAR) 40 MG tablet Take 1 tablet (40 mg total) by mouth daily.  Marland Kitchen albuterol (PROAIR HFA) 108 (90 Base) MCG/ACT inhaler Inhale 2 puffs into the lungs every 6 (six) hours as needed for  wheezing or shortness of breath.    FAMILY HISTORY:  His indicated that his mother is deceased. He indicated that his father is deceased. He indicated that the status of his brother is unknown.   SOCIAL HISTORY: He  reports that he quit smoking about 6 months ago. His smoking use included cigarettes. He has a 128.00 pack-year smoking history. He has never used smokeless tobacco. He reports that he does not drink alcohol or use drugs.  REVIEW OF SYSTEMS:   Negative apart from than which is mentioned above.  SUBJECTIVE:  Extubated yesterday.   VITAL SIGNS: BP (!) 155/82   Pulse 75   Temp 98.6 F (37 C) (Oral)   Resp 17   Ht 5\' 7"  (1.702 m)   Wt 146 lb 2.6 oz (66.3 kg)   SpO2 99%   BMI 22.89 kg/m   HEMODYNAMICS:  On no vasopressors.  VENTILATOR SETTINGS: Vent Mode: PSV;CPAP FiO2 (%):  [30 %] 30 % Set Rate:  [20 bmp] 20 bmp Vt Set:  [530 mL] 530 mL PEEP:  [5 cmH20] 5 cmH20 Pressure Support:  [10 cmH20] 10 cmH20 Plateau Pressure:  [14 cmH20-16 cmH20] 16 cmH20  INTAKE / OUTPUT: I/O last 3 completed shifts: In: 2147.2 [P.O.:240; I.V.:1528.9; IV Piggyback:378.2] Out: 2045 [Urine:2045]  PHYSICAL EXAMINATION: General:  Looks stated age. Agitated delirium Neuro:  No focal deficits. HEENT:  No icterus. Cardiovascular:  2/6 SEM. Sternotomy scar. S1S2 normal. Extremities warm. No edema. Lungs:  Clear to auscultation.  Abdomen:  Soft, non-tender. Musculoskeletal:  No visible injuries. Skin:  Intact.  LABS:  BMET Recent Labs  Lab 01/24/18  2313 01/26/18 0342  NA 138 137  K 4.1 3.8  CL 101 102  CO2 27 27  BUN 18 16  CREATININE 1.63* 0.97  GLUCOSE 206* 120*    Electrolytes Recent Labs  Lab 01/24/18 2313 01/26/18 0342  CALCIUM 9.0 8.9    CBC Recent Labs  Lab 01/24/18 2313 01/26/18 0342  WBC 15.6* 8.9  HGB 10.9* 9.6*  HCT 35.7* 32.9*  PLT 253 186    Coag's No results for input(s): APTT, INR in the last 168 hours.  Sepsis Markers Recent Labs   Lab 01/25/18 0449 01/26/18 0342  PROCALCITON 0.17 0.25    ABG Recent Labs  Lab 01/25/18 0120 01/25/18 0310 01/25/18 0623  PHART 6.987* 7.295* 7.399  PCO2ART VALUE ABOVE REPORTABLE RANGE 55.7* 40.7  PO2ART 107 63.6* 79.0*    Liver Enzymes Recent Labs  Lab 01/24/18 2313  AST 33  ALT 21  ALKPHOS 65  BILITOT 0.2*  ALBUMIN 4.4    Cardiac Enzymes No results for input(s): TROPONINI, PROBNP in the last 168 hours.  Glucose Recent Labs  Lab 01/24/18 2311 01/25/18 0432 01/25/18 0735 01/25/18 1140 01/25/18 1539 01/25/18 2008  GLUCAP 177* 200* 127* 102* 137* 128*    Imaging No results found.   STUDIES:  EEG showed:  This sedated EEG is abnormal due to the presence of: 1. Moderate diffuse background slowing 2. Lateralized periodic discharges (LPDs) over the right frontotemporal region - consistent with possible epileptogenic focus. 3. Brief 5-second period of rhythmic theta activity over the right temporal region without evolution  CULTURES: negative  ANTIBIOTICS: No antibiotics  SIGNIFICANT EVENTS: Extubated 5/24.  Seen by Neurology who recommends continuing dual anticonvulsant therapy until non-compliance can be confirmed.  LINES/TUBES: PIV's only.  DISCUSSION: Status epilepticus secondary to either medication non-compliance or breakthrough on Keppra. Hypercapneic respiratory failure due to increased CO2 production on background of COPD.    ASSESSMENT / PLAN:  PULMONARY A: Respiratory failure has resolved. No active COPD exacerbation.   On baseline home O2 of 2lpm P:   Resume home COPD regimen. Progressive ambulation.  CARDIOVASCULAR A:  Hypertension s/p AVR P:  Resume home BP medications. Secondary Prevention: ASA  RENAL A:   Volume Status: Clinically euvolemic, even fluid balance. P:   Allow autoregulation. Foley: removed.  GASTROINTESTINAL A:   Nutritional Status:  Moderate nutritional risk. P:   Progressive oral  diet.  HEMATOLOGIC A:   DVT prophylaxis: UFH; Transfusion requirements:none   INFECTIOUS A:   No signs of infection, no leukocytosis. P:   No antibiotics Line Removal: PIV's only.  ENDOCRINE A:   Euglycemic   Glycemic control: No coverage required; Stress steroids: none P:   D/C CBG.  NEUROLOGIC A:   Seizure disorder, prior stroke.  No further seizures since admission but now agitated delirium. P:   Continue Keppra and Dilantin Start low dose antipsychotic to manage delirium Environmental reorientation. Transfer out of ICU.  FAMILY  - Updates: no family has been present.   CRITICAL CARE Performed by: Glory Buff, MD [facility] Pulmonary and [place] Pager: [phone] After hours [phone]  01/26/2018, 7:39 AM

## 2018-01-26 NOTE — Plan of Care (Signed)
Problem: Education: Goal: Knowledge of General Education information will improve 01/26/2018 1603 by [place] Bing, RN Outcome: Progressing 01/26/2018 1603 by [place] Bing, RN Outcome: Progressing   Problem: Health Behavior/Discharge Planning: Goal: Ability to manage health-related needs will improve 01/26/2018 1603 by [place] Bing, RN Outcome: Progressing 01/26/2018 1603 by [place] Bing, RN Outcome: Progressing   Problem: Clinical Measurements: Goal: Ability to maintain clinical measurements within normal limits will improve 01/26/2018 1603 by [place] Bing, RN Outcome: Progressing 01/26/2018 1603 by [place] Bing, RN Outcome: Progressing Goal: Will remain free from infection 01/26/2018 1603 by [place] Bing, RN Outcome: Progressing 01/26/2018 1603 by [place] Bing, RN Outcome: Progressing Goal: Diagnostic test results will improve 01/26/2018 1603 by [place] Bing, RN Outcome: Progressing 01/26/2018 1603 by [place] Bing, RN Outcome: Progressing Goal: Respiratory complications will improve 01/26/2018 1603 by [place] Bing, RN Outcome: Progressing 01/26/2018 1603 by [place] Bing, RN Outcome: Progressing Goal: Cardiovascular complication will be avoided 01/26/2018 1603 by [place] Bing, RN Outcome: Progressing 01/26/2018 1603 by [place] Bing, RN Outcome: Progressing   Problem: Nutrition: Goal: Adequate nutrition will be maintained 01/26/2018 1603 by [place] Bing, RN Outcome: Progressing 01/26/2018 1603 by [place] Bing, RN Outcome: Progressing   Problem: Coping: Goal: Level of anxiety will decrease 01/26/2018 1603 by [place] Bing, RN Outcome: Progressing 01/26/2018 1603 by [place] Bing, RN Outcome: Progressing   Problem: Elimination: Goal: Will not experience complications related to bowel motility 01/26/2018 1603 by [place] Bing, RN Outcome: Progressing 01/26/2018 1603 by [place] Bing,  RN Outcome: Progressing Goal: Will not experience complications related to urinary retention 01/26/2018 1603 by [place] Bing, RN Outcome: Progressing 01/26/2018 1603 by [place] Bing, RN Outcome: Progressing   Problem: Skin Integrity: Goal: Risk for impaired skin integrity will decrease 01/26/2018 1603 by [place] Bing, RN Outcome: Progressing 01/26/2018 1603 by [place] Bing, RN Outcome: Progressing   Problem: Education: Goal: Expressions of having a comfortable level of knowledge regarding the disease process will increase 01/26/2018 1603 by [place] Bing, RN Outcome: Progressing 01/26/2018 1603 by [place] Bing, RN Outcome: Progressing   Problem: Coping: Goal: Ability to adjust to condition or change in health will improve 01/26/2018 1603 by [place] Bing, RN Outcome: Progressing 01/26/2018 1603 by [place] Bing, RN Outcome: Progressing Goal: Ability to identify appropriate support needs will improve 01/26/2018 1603 by [place] Bing, RN Outcome: Progressing 01/26/2018 1603 by [place] Bing, RN Outcome: Progressing   Problem: Health Behavior/Discharge Planning: Goal: Compliance with prescribed medication regimen will improve 01/26/2018 1603 by [place] Bing, RN Outcome: Progressing 01/26/2018 1603 by [place] Bing, RN Outcome: Progressing   Problem: Medication: Goal: Risk for medication side effects will decrease 01/26/2018 1603 by [place] Bing, RN Outcome: Progressing 01/26/2018 1603 by [place] Bing, RN Outcome: Progressing   Problem: Clinical Measurements: Goal: Complications related to the disease process, condition or treatment will be avoided or minimized 01/26/2018 1603 by [place] Bing, RN Outcome: Progressing 01/26/2018 1603 by [place] Bing, RN Outcome: Progressing Goal: Diagnostic test results will improve 01/26/2018 1603 by [place] Bing, RN Outcome: Progressing 01/26/2018 1603 by [place] Bing, RN Outcome: Progressing   Problem: Safety: Goal: Verbalization of understanding the information provided will improve 01/26/2018 1603 by [place] Bing, RN Outcome: Progressing 01/26/2018 1603 by [place] Bing, RN Outcome: Progressing   Problem: Self-Concept: Goal: Level of anxiety will decrease 01/26/2018 1603 by [place] Bing, RN Outcome: Progressing 01/26/2018 1603 by [place] Bing, RN Outcome: Progressing Goal: Ability to verbalize feelings about condition will improve 01/26/2018 1603 by [place] Bing, RN Outcome: Progressing 01/26/2018 1603 by [place] Bing, RN Outcome: Progressing

## 2018-01-26 NOTE — Progress Notes (Addendum)
Reason for consult: Seizures CC: none per pt  Subjective: Patient no longer having seizures. He is extubated and following commands and nearly back to baseline, still with some confusion.   ROS: negative answers per pt; limited d/t confusion. Denies any pain or discomfort   Examination  Vital signs in last 24 hours: Temp:  [98.5 F (36.9 C)-100.2 F (37.9 C)] 98.5 F (36.9 C) (05/25 0823) Pulse Rate:  [73-95] 87 (05/25 0908) Resp:  [11-28] 20 (05/25 0908) BP: (122-164)/(61-88) 144/73 (05/25 0908) SpO2:  [90 %-99 %] 97 % (05/25 0908) FiO2 (%):  [30 %] 30 % (05/24 1532) Weight:  [66.3 kg (146 lb 2.6 oz)] 66.3 kg (146 lb 2.6 oz) (05/25 0400)  General: Not in distress, cooperative, CVS: pulse-normal rate and rhythm RS: breathing comfortably, lungs clear, good cough Extremities: normal   Neuro:  MS: Alert, oriented to all questions except for place. follows commands CN: pupils equal and reactive,  EOMI, face symmetric, Motor: 5/5 strength in all 4 extremities Coordination: normal Gait: not tested  Basic Metabolic Panel: Recent Labs  Lab 01/24/18 2313 01/26/18 0342  NA 138 137  K 4.1 3.8  CL 101 102  CO2 27 27  GLUCOSE 206* 120*  BUN 18 16  CREATININE 1.63* 0.97  CALCIUM 9.0 8.9    CBC: Recent Labs  Lab 01/24/18 2313 01/26/18 0342  WBC 15.6* 8.9  NEUTROABS 11.1* 7.1  HGB 10.9* 9.6*  HCT 35.7* 32.9*  MCV 82.4 82.9  PLT 253 186   Imaging Reviewed:    ASSESSMENT AND PLAN 77 yo M with recurrent status epilepticus. Given the report of right sided gaze with convulsion followed by post-ictal left sided gaze has now resolved. 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  # Enceophalopathy with agitation/delirium- starting to improved, likely d/t post  ictal state, as well as the hypercarbic state and delirium. favor Seroquel over benzos for agitation.   PLAN/RECS: Continue Dilantin, Keppra Recommend switching dilantin dosing to 300mg  QHS instead of am dosing Ck dilantin level in am Seizure precautions Out pt neuro f/u We will sign off at this time   Desiree Metzger-Cihelka ARNP-C [facility] For questions after 7pm please refer to AMION to reach the Neurologist on call   NEUROHOSPITALIST ADDENDUM  I have reviewed the contents of history and physical exam as documented by PA/ARNP/Resident and agree with above documentation.  I have discussed and formulated the above plan as documented.   Karena Addison Aroor MD [facility] [phone]   If 7pm to 7am, please call on call as listed on AMION.

## 2018-01-27 LAB — PHENYTOIN LEVEL, TOTAL: PHENYTOIN LVL: 12 ug/mL (ref 10.0–20.0)

## 2018-01-27 MED ORDER — HYDRALAZINE HCL 20 MG/ML IJ SOLN
5.0000 mg | Freq: Four times a day (QID) | INTRAMUSCULAR | Status: DC | PRN
  Administered 2018-01-27 – 2018-02-11 (×6): 5 mg via INTRAVENOUS
  Filled 2018-01-27 (×6): qty 1

## 2018-01-27 MED ORDER — LEVETIRACETAM IN NACL 1000 MG/100ML IV SOLN
1000.0000 mg | Freq: Two times a day (BID) | INTRAVENOUS | Status: DC
  Administered 2018-01-27 – 2018-01-28 (×4): 1000 mg via INTRAVENOUS
  Filled 2018-01-27 (×7): qty 100

## 2018-01-27 MED ORDER — IPRATROPIUM-ALBUTEROL 0.5-2.5 (3) MG/3ML IN SOLN: 3.0000 mL | Freq: Four times a day (QID) | RESPIRATORY_TRACT | Status: DC | PRN

## 2018-01-27 MED ORDER — QUETIAPINE FUMARATE 25 MG PO TABS
25.0000 mg | ORAL_TABLET | Freq: Every day | ORAL | Status: DC
  Administered 2018-01-27: 25 mg via ORAL
  Filled 2018-01-27: qty 1

## 2018-01-27 MED ORDER — HYDRALAZINE HCL 20 MG/ML IJ SOLN: 5.0000 mg | Freq: Four times a day (QID) | INTRAMUSCULAR | Status: DC | PRN

## 2018-01-27 MED ORDER — PHENYTOIN SODIUM 50 MG/ML IJ SOLN
100.0000 mg | Freq: Three times a day (TID) | INTRAMUSCULAR | Status: DC
  Administered 2018-01-27 – 2018-01-29 (×5): 100 mg via INTRAVENOUS
  Filled 2018-01-27 (×5): qty 2

## 2018-01-27 NOTE — Progress Notes (Signed)
Page to [name], 3w37 ben,car is not alert enough this morning to consume his medications [phone]

## 2018-01-27 NOTE — Progress Notes (Signed)
PROGRESS NOTE                                                                                                                                                                                                             Patient Demographics:    Danny Krause, is a 77 y.o. male, DOB - [birth year], QQV:[id number]  Admit date - 01/24/2018   Admitting Physician Kandice Hams, MD  Outpatient Primary MD for the patient is Jani Gravel, MD  LOS - 2   Chief Complaint  Patient presents with  . 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  First diagnosed with seizures in 12/2017 and discharged home on Keppra. Questionable compliance at home.  Found unresponsive at home with seizure like activity.  Intubated in ED for hypercapneic respiratory failure.Extubated on 5/ 24.    Subjective:    [name] had significant IV agitation and delirium yesterday where he required Haldol and Ativan, he is lethargic this morning, cannot provide any complaints.   Assessment  & Plan :    Active Problems:   Acute respiratory failure with hypercapnia (HCC)  Seizures -Patient with recurrent status epilepticus on admission, he required intubation, was on Keppra, Dilantin was added, that is epilepticus resolved, he was successfully extubated. -Given he is lethargic this morning, I have changed his Dilantin and Keppra to IV. -Neurology signed off  Metabolic encephalopathy -Is lethargic this morning, cannot take any of his oral medications, required Ativan and Haldol yesterday for hospital delirium.  Been stopped, will decrease evening dose of Seroquel to 25 -As well he had some post ictal encephalopathy.  Acute hypercapnic respiratory failure  -Due to status epilepticus, required intubation for airway protection, successfully extubated,    -Oxygen at nighttime  COPD  -no Wheezing, continue with as needed nebs   Hypertension  -Pressure is elevated, but given his encephalopathy cannot give any p.o. medications, continue with as needed hydralazine, and resume on home regimen when he is more awake  History CVA -continue with aspirin      Code Status : Full  Family Communication  : None at bedside  Disposition Plan  : SNF when stable  Consults  :  PCCM, neurolgoy  Procedures  : Intubation/extubation, EEG  DVT Prophylaxis  : [place] heparin  Lab Results  Component Value Date   PLT 186 01/26/2018    Antibiotics  :   Anti-infectives (From admission, onward)   None        Objective:   Vitals:   01/27/18 0444 01/27/18 0848 01/27/18 0905 01/27/18 1238  BP: (!) 148/89 (!) 169/82  (!) 149/89  Pulse: 84 83 85 91  Resp: 18 18 18 20   Temp: 99 F (37.2 C) 99.3 F (37.4 C)    TempSrc: Oral Oral    SpO2:  95% 96% 96%  Weight:      Height:        Wt Readings from Last 3 Encounters:  01/26/18 66.3 kg (146 lb 2.6 oz)  01/24/18 70.6 kg (155 lb 9.6 oz)  01/10/18 68.9 kg (151 lb 12.8 oz)     Intake/Output Summary (Last 24 hours) at 01/27/2018 1316 Last data filed at 01/27/2018 0600 Gross per 24 hour  Intake -  Output 200 ml  Net -200 ml     Physical Exam  Patient is sleeping, on soft to loud verbal stimuli, but go back to sleep Symmetrical Chest wall movement, Good air movement bilaterally, CTAB RRR,No Gallops,Rubs or new Murmurs, No Parasternal Heave +ve B.Sounds, Abd Soft, No tenderness, No rebound - guarding or rigidity. No Cyanosis, Clubbing or edema, No new Rash or bruise      Data Review:    CBC Recent Labs  Lab 01/24/18 2313 01/26/18 0342  WBC 15.6* 8.9  HGB 10.9* 9.6*  HCT 35.7* 32.9*  PLT 253 186  MCV 82.4 82.9  MCH 25.2* 24.2*  MCHC 30.5 29.2*  RDW 15.0 14.9  LYMPHSABS 3.0 0.9  MONOABS 0.8 0.8  EOSABS 0.7 0.1  BASOSABS 0.0 0.0    Chemistries  Recent Labs  Lab  01/24/18 2313 01/26/18 0342  NA 138 137  K 4.1 3.8  CL 101 102  CO2 27 27  GLUCOSE 206* 120*  BUN 18 16  CREATININE 1.63* 0.97  CALCIUM 9.0 8.9  AST 33  --   ALT 21  --   ALKPHOS 65  --   BILITOT 0.2*  --    ------------------------------------------------------------------------------------------------------------------ Recent Labs    01/25/18 0449  TRIG 83    Lab Results  Component Value Date   HGBA1C 5.9 (H) 07/03/2017   ------------------------------------------------------------------------------------------------------------------ No results for input(s): TSH, T4TOTAL, T3FREE, THYROIDAB in the last 72 hours.  Invalid input(s): FREET3 ------------------------------------------------------------------------------------------------------------------ No results for input(s): VITAMINB12, FOLATE, FERRITIN, TIBC, IRON, RETICCTPCT in the last 72 hours.  Coagulation profile No results for input(s): INR, PROTIME in the last 168 hours.  No results for input(s): DDIMER in the last 72 hours.  Cardiac Enzymes No results for input(s): CKMB, TROPONINI, MYOGLOBIN in the last 168 hours.  Invalid input(s): CK ------------------------------------------------------------------------------------------------------------------ No results found for: BNP  Inpatient Medications  Scheduled Meds: . aspirin  325 mg Oral Daily  . heparin  5,000 Units Subcutaneous Q8H  . irbesartan  300 mg Oral Daily  . mometasone-formoterol  2 puff Inhalation BID  . phenytoin (DILANTIN) IV  100 mg Intravenous Q8H  . QUEtiapine  50 mg Oral QHS   Continuous Infusions: . levETIRAcetam 1,000 mg (01/27/18 1303)   PRN Meds:.docusate, hydrALAZINE, ipratropium-albuterol, LORazepam  Micro Results Recent Results (from the past 240 hour(s))  MRSA PCR Screening     Status: None   Collection Time: 01/25/18  4:26 AM  Result Value  Ref Range Status   MRSA by PCR NEGATIVE NEGATIVE Final    Comment:         The GeneXpert MRSA Assay (FDA approved for NASAL specimens only), is one component of a comprehensive MRSA colonization surveillance program. It is not intended to diagnose MRSA infection nor to guide or monitor treatment for MRSA infections. Performed at [facility], [place] [address], [place], [place] [address]     Radiology Reports Ct Head Wo Contrast  Result Date: 01/25/2018 CLINICAL DATA:  Altered LOC EXAM: CT HEAD WITHOUT CONTRAST TECHNIQUE: Contiguous axial images were obtained from the base of the skull through the vertex without intravenous contrast. COMPARISON:  None. FINDINGS: Brain: No acute territorial infarction, hemorrhage or intracranial mass. Mild small vessel ischemic changes of the white matter. Small focus of encephalomalacia in the right frontal lobe consistent with old infarct. Mild atrophy. Nonenlarged ventricles. Vascular: No hyperdense vessels.  Carotid vascular calcification Skull: Normal. Negative for fracture or focal lesion. Sinuses/Orbits: Mucosal thickening in the ethmoid sinuses. No acute orbital abnormality. Other: None IMPRESSION: 1. No CT evidence for acute intracranial abnormality. 2. Atrophy and small vessel ischemic changes of the white matter. Old right frontal lobe infarct Electronically Signed   By: Donavan Foil M.D.   On: 01/25/2018 00:33   Dg Chest Portable 1 View  Result Date: 01/25/2018 CLINICAL DATA:  Post intubation. EXAM: PORTABLE CHEST 1 VIEW COMPARISON:  Chest radiograph August 21, 2017 FINDINGS: Endotracheal tube tip projects 2 cm above the carina. Nasogastric tube side port at GE junction region, distal tip out of field-of-view. Stable cardiomegaly. Status post median sternotomy for cardiac valve replacement. Mildly calcified aortic arch. Pulmonary vascular congestion without pleural effusion or focal consolidation. No pneumothorax. Soft tissue planes included osseous structures are nonsuspicious. IMPRESSION: Endotracheal tube tip  projects 2 cm above the carina. Nasogastric tube side port at GE junction region, distal tip out of field-of-view. Stable cardiomegaly with pulmonary vascular congestion. Aortic Atherosclerosis (ICD10-I70.0). Electronically Signed   By: Elon Alas M.D.   On: 01/25/2018 02:36   Dg Abd Portable 1 View  Result Date: 01/25/2018 CLINICAL DATA:  Orogastric tube placement. EXAM: PORTABLE ABDOMEN - 1 VIEW COMPARISON:  None. FINDINGS: Side port projecting at GE junction region, distal tip in proximal stomach region. Included bowel gas pattern is nondilated and nonobstructive. No intra-abdominal mass effect or pathologic calcifications. Soft tissue planes and included osseous structures are nonsuspicious. IMPRESSION: Nasogastric tube tip projects in proximal stomach. Electronically Signed   By: Elon Alas M.D.   On: 01/25/2018 02:37     Phillips Climes M.D on 01/27/2018 at 1:16 PM  Between 7am to 7pm - Pager - [phone]  After 7pm go to [email or web address] - password [facility]  [facility] -  Office  [phone]

## 2018-01-27 NOTE — Plan of Care (Signed)
  Problem: Education: Goal: Knowledge of General Education information will improve Outcome: Progressing   Problem: Health Behavior/Discharge Planning: Goal: Ability to manage health-related needs will improve Outcome: Progressing   Problem: Clinical Measurements: Goal: Ability to maintain clinical measurements within normal limits will improve Outcome: Progressing Goal: Will remain free from infection Outcome: Progressing Goal: Diagnostic test results will improve Outcome: Progressing Goal: Respiratory complications will improve Outcome: Progressing Goal: Cardiovascular complication will be avoided Outcome: Progressing   Problem: Nutrition: Goal: Adequate nutrition will be maintained Outcome: Progressing   Problem: Coping: Goal: Level of anxiety will decrease Outcome: Progressing   Problem: Elimination: Goal: Will not experience complications related to bowel motility Outcome: Progressing Goal: Will not experience complications related to urinary retention Outcome: Progressing   Problem: Skin Integrity: Goal: Risk for impaired skin integrity will decrease Outcome: Progressing   Problem: Education: Goal: Expressions of having a comfortable level of knowledge regarding the disease process will increase Outcome: Progressing   Problem: Coping: Goal: Ability to adjust to condition or change in health will improve Outcome: Progressing Goal: Ability to identify appropriate support needs will improve Outcome: Progressing   Problem: Health Behavior/Discharge Planning: Goal: Compliance with prescribed medication regimen will improve Outcome: Progressing   Problem: Medication: Goal: Risk for medication side effects will decrease Outcome: Progressing   Problem: Clinical Measurements: Goal: Complications related to the disease process, condition or treatment will be avoided or minimized Outcome: Progressing Goal: Diagnostic test results will improve Outcome:  Progressing   Problem: Safety: Goal: Verbalization of understanding the information provided will improve Outcome: Progressing   Problem: Self-Concept: Goal: Level of anxiety will decrease Outcome: Progressing Goal: Ability to verbalize feelings about condition will improve Outcome: Progressing

## 2018-01-28 ENCOUNTER — Inpatient Hospital Stay (HOSPITAL_COMMUNITY): Payer: Medicare Other

## 2018-01-28 LAB — BASIC METABOLIC PANEL
Anion gap: 13 (ref 5–15)
BUN: 21 mg/dL — ABNORMAL HIGH (ref 6–20)
CALCIUM: 9.1 mg/dL (ref 8.9–10.3)
CO2: 25 mmol/L (ref 22–32)
Chloride: 96 mmol/L — ABNORMAL LOW (ref 101–111)
Creatinine, Ser: 0.99 mg/dL (ref 0.61–1.24)
GFR calc Af Amer: >60 mL/min (ref >60)
GFR calc non Af Amer: >60 mL/min (ref >60)
GLUCOSE: 100 mg/dL — AB (ref 65–99)
Potassium: 3.2 mmol/L — ABNORMAL LOW (ref 3.5–5.1)
Sodium: 134 mmol/L — ABNORMAL LOW (ref 135–145)

## 2018-01-28 LAB — CBC
HEMATOCRIT: 35.1 % — AB (ref 39.0–52.0)
Hemoglobin: 11 g/dL — ABNORMAL LOW (ref 13.0–17.0)
MCH: 24.1 pg — AB (ref 26.0–34.0)
MCHC: 31.3 g/dL (ref 30.0–36.0)
MCV: 76.8 fL — AB (ref 78.0–100.0)
Platelets: 285 10*3/uL (ref 150–400)
RBC: 4.57 MIL/uL (ref 4.22–5.81)
RDW: 14.5 % (ref 11.5–15.5)
WBC: 11.2 10*3/uL — ABNORMAL HIGH (ref 4.0–10.5)

## 2018-01-28 LAB — GLUCOSE, CAPILLARY: GLUCOSE-CAPILLARY: 104 mg/dL — AB (ref 65–99)

## 2018-01-28 LAB — PHENYTOIN LEVEL, TOTAL: Phenytoin Lvl: 9.2 ug/mL — ABNORMAL LOW (ref 10.0–20.0)

## 2018-01-28 MED ORDER — ETOMIDATE 2 MG/ML IV SOLN
0.3000 mg/kg | Freq: Once | INTRAVENOUS | Status: AC
  Administered 2018-01-28: 19.9 mg via INTRAVENOUS

## 2018-01-28 MED ORDER — LORAZEPAM 2 MG/ML IJ SOLN
INTRAMUSCULAR | Status: AC
  Administered 2018-01-28: 2 mg
  Filled 2018-01-28: qty 1

## 2018-01-28 MED ORDER — ETOMIDATE 2 MG/ML IV SOLN
INTRAVENOUS | Status: AC
  Administered 2018-01-28: 20 mg
  Filled 2018-01-28: qty 10

## 2018-01-28 MED ORDER — PHENYLEPHRINE HCL-NACL 10-0.9 MG/250ML-% IV SOLN
0.0000 ug/min | INTRAVENOUS | Status: DC
  Administered 2018-01-28: 75 ug/min via INTRAVENOUS
  Administered 2018-01-28: 20 ug/min via INTRAVENOUS
  Administered 2018-01-29: 60 ug/min via INTRAVENOUS
  Administered 2018-01-29 (×3): 50 ug/min via INTRAVENOUS
  Administered 2018-01-29: 40 ug/min via INTRAVENOUS
  Administered 2018-01-29: 55 ug/min via INTRAVENOUS
  Administered 2018-01-29: 60 ug/min via INTRAVENOUS
  Filled 2018-01-28 (×13): qty 250

## 2018-01-28 MED ORDER — CHLORHEXIDINE GLUCONATE 0.12% ORAL RINSE (MEDLINE KIT)
15.0000 mL | Freq: Two times a day (BID) | OROMUCOSAL | Status: DC
  Administered 2018-01-28 – 2018-02-11 (×28): 15 mL via OROMUCOSAL

## 2018-01-28 MED ORDER — IOPAMIDOL (ISOVUE-370) INJECTION 76%
100.0000 mL | Freq: Once | INTRAVENOUS | Status: AC | PRN
  Administered 2018-01-28: 100 mL via INTRAVENOUS

## 2018-01-28 MED ORDER — MIDAZOLAM HCL 2 MG/2ML IJ SOLN
INTRAMUSCULAR | Status: AC
  Filled 2018-01-28: qty 10

## 2018-01-28 MED ORDER — SODIUM CHLORIDE 0.9 % IV SOLN
5.0000 mg/kg | INTRAVENOUS | Status: AC
  Administered 2018-01-28: 331.5 mg via INTRAVENOUS
  Filled 2018-01-28 (×2): qty 6.63

## 2018-01-28 MED ORDER — PROPOFOL 1000 MG/100ML IV EMUL
80.0000 ug/kg/min | INTRAVENOUS | Status: DC
  Administered 2018-01-28: 20 ug/kg/min via INTRAVENOUS
  Administered 2018-01-28 – 2018-01-30 (×13): 80 ug/kg/min via INTRAVENOUS
  Administered 2018-01-30: 40 ug/kg/min via INTRAVENOUS
  Administered 2018-01-31: 80 ug/kg/min via INTRAVENOUS
  Administered 2018-01-31: 20 ug/kg/min via INTRAVENOUS
  Administered 2018-01-31: 70 ug/kg/min via INTRAVENOUS
  Administered 2018-01-31 (×4): 80 ug/kg/min via INTRAVENOUS
  Administered 2018-02-02: 15 ug/kg/min via INTRAVENOUS
  Administered 2018-02-03 (×2): 25 ug/kg/min via INTRAVENOUS
  Filled 2018-01-28 (×5): qty 100
  Filled 2018-01-28: qty 200
  Filled 2018-01-28 (×19): qty 100

## 2018-01-28 MED ORDER — PROPOFOL BOLUS VIA INFUSION
1.0000 mg/kg | Freq: Once | INTRAVENOUS | Status: AC
  Administered 2018-01-28 (×2): 33 mg via INTRAVENOUS
  Filled 2018-01-28: qty 67

## 2018-01-28 MED ORDER — LORAZEPAM 2 MG/ML IJ SOLN: 2.0000 mg | Freq: Once | INTRAMUSCULAR | Status: DC

## 2018-01-28 MED ORDER — IOPAMIDOL (ISOVUE-370) INJECTION 76%
INTRAVENOUS | Status: AC
  Filled 2018-01-28: qty 100

## 2018-01-28 MED ORDER — LEVETIRACETAM IN NACL 1000 MG/100ML IV SOLN
1000.0000 mg | INTRAVENOUS | Status: AC
  Administered 2018-01-28: 1000 mg via INTRAVENOUS
  Filled 2018-01-28: qty 100

## 2018-01-28 MED ORDER — DOCUSATE SODIUM 50 MG/5ML PO LIQD
100.0000 mg | Freq: Two times a day (BID) | ORAL | Status: DC | PRN
  Administered 2018-01-29 – 2018-01-31 (×4): 100 mg
  Filled 2018-01-28 (×3): qty 10

## 2018-01-28 MED ORDER — PIPERACILLIN-TAZOBACTAM 3.375 G IVPB
3.3750 g | Freq: Three times a day (TID) | INTRAVENOUS | Status: DC
Start: 2018-01-28 — End: 2018-02-01
  Administered 2018-01-28 – 2018-02-01 (×11): 3.375 g via INTRAVENOUS
  Filled 2018-01-28 (×13): qty 50

## 2018-01-28 MED ORDER — MIDAZOLAM HCL 2 MG/2ML IJ SOLN
10.0000 mg | INTRAMUSCULAR | Status: DC
  Administered 2018-01-28: 10 mg via INTRAVENOUS
  Filled 2018-01-28 (×5): qty 10

## 2018-01-28 MED ORDER — ORAL CARE MOUTH RINSE
15.0000 mL | OROMUCOSAL | Status: DC
  Administered 2018-01-28 – 2018-02-11 (×131): 15 mL via OROMUCOSAL

## 2018-01-28 MED ORDER — SODIUM CHLORIDE 0.9 % IV SOLN
20.0000 mg/h | INTRAVENOUS | Status: DC
  Administered 2018-01-28 – 2018-01-29 (×2): 10 mg/h via INTRAVENOUS
  Administered 2018-01-29: 20 mg/h via INTRAVENOUS
  Administered 2018-01-29: 10 mg/h via INTRAVENOUS
  Administered 2018-01-29 (×2): 20 mg/h via INTRAVENOUS
  Filled 2018-01-28 (×7): qty 10

## 2018-01-28 MED ORDER — LORAZEPAM 2 MG/ML IJ SOLN
INTRAMUSCULAR | Status: AC
  Administered 2018-01-28: 1 mg via INTRAVENOUS
  Filled 2018-01-28: qty 1

## 2018-01-28 MED ORDER — MIDAZOLAM HCL 10 MG/2ML IJ SOLN: 10.0000 mg/h | INTRAVENOUS | Status: DC

## 2018-01-28 MED ORDER — LORAZEPAM 2 MG/ML IJ SOLN
1.0000 mg | Freq: Once | INTRAMUSCULAR | Status: AC
  Administered 2018-01-28: 1 mg via INTRAVENOUS

## 2018-01-28 MED ORDER — FAMOTIDINE IN NACL 20-0.9 MG/50ML-% IV SOLN
20.0000 mg | Freq: Two times a day (BID) | INTRAVENOUS | Status: DC
  Administered 2018-01-28 – 2018-02-06 (×18): 20 mg via INTRAVENOUS
  Filled 2018-01-28 (×18): qty 50

## 2018-01-28 NOTE — Progress Notes (Signed)
EEG reviewed - continues to shows intermittent sharps that are lateralized to the right. Increased propofol - one half bolus followed by 80/h. Continued PLED like activity. Added Midazolam. Load 10mg  IV x1 followed by 10mg /hr. Some resolution with increased sedation.  Will continue to monitor.  -- Amie Portland, MD [facility] Pager: [phone] If 7pm to 7am, please call on call as listed on AMION.

## 2018-01-28 NOTE — Progress Notes (Signed)
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  First diagnosed with seizures in 12/2017 and discharged home on Keppra. Questionable compliance at home.  Found unresponsive at home with seizure like activity.  Intubated in ED for hypercapneic respiratory failure.  On the floor today he developed new left-sided weakness and a CT angiogram was performed which showed no evidence of large vessel occlusion.  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  In light of his extremely poor baseline respiratory function and prior CO2 retention during this admission only 1 mg of Ativan was administered on the floor after which there was still seizure activity on the EEG but the patient was profoundly somnolent.  Was elected to transfer him to ICU for intubation to allow deep sedation to burst suppression.  PAST MEDICAL HISTORY :  He  has a past medical history of Anginal pain ([place]), Coronary artery disease, Critical aortic valve stenosis, Dyspnea, Murmur, Seizure ([place]), and Syncope.  PAST SURGICAL HISTORY: He  has a past surgical history that includes No past surgeries; RIGHT/LEFT HEART CATH AND CORONARY ANGIOGRAPHY (N/A, 06/25/2017); THORACIC AORTOGRAM (06/25/2017); Aortic valve replacement (N/A, 07/04/2017); Coronary artery bypass graft (N/A, 07/04/2017); and TEE without cardioversion (N/A, 07/04/2017).  No Known Allergies  No current facility-administered medications on file prior to encounter.    Current Outpatient Medications on File Prior to Encounter  Medication Sig  . acetaminophen (TYLENOL) 650 MG CR tablet Take 650 mg by mouth every 8 (eight) hours as  needed for pain.  . budesonide-formoterol (SYMBICORT) 160-4.5 MCG/ACT inhaler Inhale 2 puffs into the lungs 2 (two) times daily. (Patient taking differently: Inhale 2 puffs into the lungs daily. )  . levETIRAcetam (KEPPRA) 500 MG tablet Take 2 tablets (1,000 mg total) by mouth 2 (two) times daily.  . metoprolol tartrate (LOPRESSOR) 25 MG tablet TAKE 1/2 TABLET BY MOUTH TWICE A DAY  . olmesartan (BENICAR) 40 MG tablet Take 1 tablet (40 mg total) by mouth daily.  Marland Kitchen albuterol (PROAIR HFA) 108 (90 Base) MCG/ACT inhaler Inhale 2 puffs into the lungs every 6 (six) hours as needed for wheezing or shortness of breath.    FAMILY HISTORY:  His indicated that his mother is deceased. He indicated that his father is deceased. He indicated that the status of his brother is unknown.   SOCIAL HISTORY: He  reports that he quit smoking about 6 months ago. His smoking use included cigarettes. He has a 128.00 pack-year smoking history. He has never used smokeless tobacco. He reports that he does not drink alcohol or use drugs.  REVIEW OF SYSTEMS:   Negative apart from than which is mentioned above.  SUBJECTIVE:   VITAL SIGNS: BP (!) 161/95   Pulse 84   Temp 98.6 F (37 C) (Oral)   Resp 13   Ht 5\' 5"  (1.651 m)   Wt 146 lb 2.6 oz (66.3 kg)   SpO2 100%   BMI 24.32 kg/m   HEMODYNAMICS:  On no vasopressors.  VENTILATOR SETTINGS: Vent Mode: PRVC FiO2 (%):  [100 %] 100 % Set Rate:  [15 bmp] 15 bmp Vt Set:  [500 mL] 500 mL PEEP:  [  [place] Pressure:  [13 cmH20] 13 cmH20  INTAKE / OUTPUT: I/O last 3 completed shifts: In: 160 [P.O.:60; IV Piggyback:100] Out: 300 [Urine:300]  PHYSICAL EXAMINATION: General: Elderly and somewhat cachectic no intubated and mechanically ventilated   Neuro: Currently not interactive but he is received drugs for intubation.  Pupils are equal and reactive.  Face is symmetric.  He does withdraw all fours from pain there may be some asymmetry with the left  being a little less vigorous. HEENT:  No icterus. Cardiovascular: S1 and S2 are regular without murmur rub or gallop there is a sternotomy scar.  There is no edema.  Lungs: Lots of purulent secretions in the endotracheal tube.  There is symmetric air movement with lots of scattered rhonchi and no wheezes.  He does appear to be trapping somewhat by flow versus time.   Abdomen:  Soft, non-tender. Musculoskeletal:  No visible injuries. Skin:  Intact.  LABS:  BMET Recent Labs  Lab 01/24/18 2313 01/26/18 0342 01/28/18 0356  NA 138 137 134*  K 4.1 3.8 3.2*  CL 101 102 96*  CO2 27 27 25   BUN 18 16 21*  CREATININE 1.63* 0.97 0.99  GLUCOSE 206* 120* 100*    Electrolytes Recent Labs  Lab 01/24/18 2313 01/26/18 0342 01/28/18 0356  CALCIUM 9.0 8.9 9.1    CBC Recent Labs  Lab 01/24/18 2313 01/26/18 0342 01/28/18 0356  WBC 15.6* 8.9 11.2*  HGB 10.9* 9.6* 11.0*  HCT 35.7* 32.9* 35.1*  PLT 253 186 285    Coag's No results for input(s): APTT, INR in the last 168 hours.  Sepsis Markers Recent Labs  Lab 01/25/18 0449 01/26/18 0342  PROCALCITON 0.17 0.25    ABG Recent Labs  Lab 01/25/18 0120 01/25/18 0310 01/25/18 0623  PHART 6.987* 7.295* 7.399  PCO2ART VALUE ABOVE REPORTABLE RANGE 55.7* 40.7  PO2ART 107 63.6* 79.0*    Liver Enzymes Recent Labs  Lab 01/24/18 2313  AST 33  ALT 21  ALKPHOS 65  BILITOT 0.2*  ALBUMIN 4.4    Cardiac Enzymes No results for input(s): TROPONINI, PROBNP in the last 168 hours.  Glucose Recent Labs  Lab 01/25/18 0432 01/25/18 0735 01/25/18 1140 01/25/18 1539 01/25/18 2008 01/28/18 1450  GLUCAP 200* 127* 102* 137* 128* 104*    Imaging Ct Angio Head W Or Wo Contrast  Result Date: 01/28/2018 CLINICAL DATA:  LEFT hemiparesis and neglect. Patient with recurrent status epilepticus. EXAM: CT ANGIOGRAPHY HEAD AND NECK TECHNIQUE: Multidetector CT imaging of the head and neck was performed using the standard protocol during  bolus administration of intravenous contrast. Multiplanar CT image reconstructions and MIPs were obtained to evaluate the vascular anatomy. Carotid stenosis measurements (when applicable) are obtained utilizing NASCET criteria, using the distal internal carotid diameter as the denominator. CONTRAST:  18mL ISOVUE-370 IOPAMIDOL (ISOVUE-370) INJECTION 76% COMPARISON:  CT head 12/18/2017. CT head 01/28/2018. perfusion reported separately. FINDINGS: CTA NECK Aortic arch: Standard branching. Imaged portion shows no evidence of aneurysm or dissection. No significant stenosis of the major arch vessel origins. Right carotid system: No evidence of dissection, stenosis (50% or greater) or occlusion. Left carotid system: No evidence of dissection, stenosis (50% or greater) or occlusion. Vertebral arteries: LEFT vertebral dominant. No evidence of dissection, stenosis (50% or greater) or occlusion. Nonvascular soft tissues: COPD, widespread emphysematous change. No neck masses. Spondylosis. Edentulous. Aortic atherosclerosis. CTA HEAD Anterior circulation: Cavernous carotid calcification. No significant stenosis, proximal occlusion, aneurysm, or vascular malformation. Posterior circulation: Both vertebrals contribute  to basilar formation. No significant stenosis, proximal occlusion, aneurysm, or vascular malformation. Venous sinuses: As permitted by contrast timing, patent. Anatomic variants: None of significance. Delayed phase:   No abnormal intracranial enhancement. Review of the MIP images confirms the above findings IMPRESSION: No intracranial or extracranial flow reducing stenosis or emergent large vessel occlusion. Aortic Atherosclerosis (ICD10-I70.0) and Emphysema (ICD10-J43.9). Electronically Signed   By: Staci Righter M.D.   On: 01/28/2018 16:20   Ct Angio Neck W Or Wo Contrast  Result Date: 01/28/2018 CLINICAL DATA:  LEFT hemiparesis and neglect. Patient with recurrent status epilepticus. EXAM: CT ANGIOGRAPHY HEAD  AND NECK TECHNIQUE: Multidetector CT imaging of the head and neck was performed using the standard protocol during bolus administration of intravenous contrast. Multiplanar CT image reconstructions and MIPs were obtained to evaluate the vascular anatomy. Carotid stenosis measurements (when applicable) are obtained utilizing NASCET criteria, using the distal internal carotid diameter as the denominator. CONTRAST:  169mL ISOVUE-370 IOPAMIDOL (ISOVUE-370) INJECTION 76% COMPARISON:  CT head 12/18/2017. CT head 01/28/2018. perfusion reported separately. FINDINGS: CTA NECK Aortic arch: Standard branching. Imaged portion shows no evidence of aneurysm or dissection. No significant stenosis of the major arch vessel origins. Right carotid system: No evidence of dissection, stenosis (50% or greater) or occlusion. Left carotid system: No evidence of dissection, stenosis (50% or greater) or occlusion. Vertebral arteries: LEFT vertebral dominant. No evidence of dissection, stenosis (50% or greater) or occlusion. Nonvascular soft tissues: COPD, widespread emphysematous change. No neck masses. Spondylosis. Edentulous. Aortic atherosclerosis. CTA HEAD Anterior circulation: Cavernous carotid calcification. No significant stenosis, proximal occlusion, aneurysm, or vascular malformation. Posterior circulation: Both vertebrals contribute to basilar formation. No significant stenosis, proximal occlusion, aneurysm, or vascular malformation. Venous sinuses: As permitted by contrast timing, patent. Anatomic variants: None of significance. Delayed phase:   No abnormal intracranial enhancement. Review of the MIP images confirms the above findings IMPRESSION: No intracranial or extracranial flow reducing stenosis or emergent large vessel occlusion. Aortic Atherosclerosis (ICD10-I70.0) and Emphysema (ICD10-J43.9). Electronically Signed   By: Staci Righter M.D.   On: 01/28/2018 16:20   Ct Cerebral Perfusion W Contrast  Result Date:  01/28/2018 CLINICAL DATA:  LEFT-sided hemiparesis and neglect. History of seizure disorder. EXAM: CT PERFUSION BRAIN TECHNIQUE: Multiphase CT imaging of the brain was performed following IV bolus contrast injection. Subsequent parametric perfusion maps were calculated using RAPID software. CONTRAST:  137mL ISOVUE-370 IOPAMIDOL (ISOVUE-370) INJECTION 76% COMPARISON:  CTA head neck reported separately. FINDINGS: CT Brain Perfusion Findings: CBF (<30%) Volume: 47mL Perfusion (Tmax>6.0s) volume: 44mL Mismatch Volume: 67mL ASPECTS on noncontrast CT Head: 9 at 3:46 p.m. today. Infarct Core: 0 mL Infarction Location:Not applicable IMPRESSION: Negative CT perfusion. Electronically Signed   By: Staci Righter M.D.   On: 01/28/2018 16:27   Ct Head Code Stroke Wo Contrast`  Result Date: 01/28/2018 CLINICAL DATA:  Code stroke.  LEFT-sided hemiparesis and neglect. EXAM: CT HEAD WITHOUT CONTRAST TECHNIQUE: Contiguous axial images were obtained from the base of the skull through the vertex without intravenous contrast. COMPARISON:  CT head 12/18/2017.  MR head 03/14/2017. FINDINGS: Brain: Asymmetry of the RIGHT insula as compared to the LEFT, see series 3, image 14, possible acute infarction. No definite lentiform nucleus involvement. Chronic RIGHT frontal cortex infarction. No hemorrhage, mass lesion, hydrocephalus, or extra-axial fluid. Mild atrophy. Hypoattenuation of white matter, likely small vessel disease. Vascular: Heavily calcified cavernous and supraclinoid internal carotid artery vessels. No definite emergent large vessel occlusion. Skull: Intact. Sinuses/Orbits: Negative. Other: None. ASPECTS [facility] Stroke Program Early  CT Score) - Ganglionic level infarction (caudate, lentiform nuclei, internal capsule, insula, M1-M3 cortex): 6. RIGHT insula abnormal. - Supraganglionic infarction (M4-M6 cortex): 3 Total score (0-10 with 10 being normal): 9 IMPRESSION: 1. Concern for RIGHT hemisphere infarct, with asymmetric insular  hypoattenuation not present previously. 2. No signs of large vessel occlusion. 3. ASPECTS is 9. 4. These results were communicated to Dr. Leonel Ramsay at 3:46 pmon 5/27/2019by text page via the [facility] messaging system. Electronically Signed   By: Staci Righter M.D.   On: 01/28/2018 15:48     STUDIES:  EEG showed:  This sedated EEG is abnormal due to the presence of: 1. Moderate diffuse background slowing 2. Lateralized periodic discharges (LPDs) over the right frontotemporal region - consistent with possible epileptogenic focus. 3. Brief 5-second period of rhythmic theta activity over the right temporal region without evolution  CULTURES: negative  ANTIBIOTICS: No antibiotics  SIGNIFICANT EVENTS: Extubated 5/24.  Seen by Neurology who recommends continuing dual anticonvulsant therapy until non-compliance can be confirmed. Reintubated 5/27 in order to allow deep sedation to burst suppression  LINES/TUBES: PIV's only.  DISCUSSION: Status epilepticus secondary to either medication non-compliance or breakthrough on Keppra. Hypercapneic respiratory failure.    ASSESSMENT / PLAN:  PULMONARY A: Chronic O2 dependent lung disease with hypercarbia following sedation for seizure control earlier in this admission requiring intubation and mechanical ventilation.  I am suspicious that he may have aspirated based on his examination and the secretions we are now obtaining from the endotracheal tube, I have started him on Zosyn empirically pending culture results.  He is on a combination of Dulera and duo nebs for bronchospasm if he does not improve it may be compelled to add systemic steroids P:   Resume home COPD regimen. Progressive ambulation.  CARDIOVASCULAR A:  Hypertension s/p AVR P:  He is currently on Avapro for blood pressure control, if we are having difficulties with hypotension related to sedation this will obviously need to be held.    RENAL A:   Volume Status: Clinically  euvolemic, even fluid balance. P:   Allow autoregulation. Foley: removed.  GASTROINTESTINAL A:   Nutritional Status:  Moderate nutritional risk. P:    HEMATOLOGIC A:   DVT prophylaxis: UFH; Transfusion requirements:none   INFECTIOUS A:   No signs of infection, no leukocytosis. P:   Dated 5/27   ENDOCRINE A:   Euglycemic   Glycemic control: No coverage required; Stress steroids: none P:   D/C CBG.  NEUROLOGIC A:   Seizure disorder, prior stroke.  No further seizures since admission but now agitated delirium. P:   Continue Keppra and Dilantin, propofol is being added to a burst suppression dose Start low dose antipsychotic to manage delirium Environmental reorientation. Transfer out of ICU.  FAMILY  - Updates: no family has been present.  Lars Masson, MD [place] Pager: [phone] After hours [phone]  01/28/2018, 7:04 PM

## 2018-01-28 NOTE — Progress Notes (Signed)
Bedside EEG completed. Dr Leonel Ramsay viewed EEG and ordered LTM.  Pt transferred to 4N.

## 2018-01-28 NOTE — Progress Notes (Signed)
LTM EEG initiated.

## 2018-01-28 NOTE — Progress Notes (Signed)
Rehab Admissions Coordinator Note:  Patient was screened by Cleatrice Burke for appropriateness for an Inpatient Acute Rehab Consult per PT recommendation. Noted pt currently total assist. He is the caregiver for his wife, but noted daughter and son very supportive.  At this time, we are recommending [facility] due to his lower level to participate at this time. I will follow his progress. Notes SW is currently working with daughter on SNF placement.  Cleatrice Burke 01/28/2018, 4:25 PM  I can be reached at [phone].

## 2018-01-28 NOTE — Evaluation (Signed)
Physical Therapy Evaluation Patient Details Name: Danny Krause MRN: [id number] DOB: December 26, 1940 Today's Date: 01/28/2018   History of Present Illness  Admitted with seizures requiring intubation for airway protection; Extubated 5/24, and recovery complicated by delirium/encephalopathy; noted significant L sided weakness 4/27, MRI ordered;  has a past medical history of Anginal pain ([place]), Coronary artery disease, Critical aortic valve stenosis, Dyspnea, Murmur, Seizure ([place]), and Syncope.COPD, and noted CVA in his PMH  Clinical Impression   Pt admitted with above diagnosis. Pt currently with functional limitations due to the deficits listed below (see PT Problem List). Presents with significant L sided weakness (imaging pending), and delirium post status epilepticus; Pt unable to give PLOF information on eval, but per chart review of recent hospital stay in April, pt was independent; Worth considering CIR because he was completely independent, and walking without an assistive device as of this April; still, noted also that pt is caregiver for his wife, and he may not have adequate assistance at home; Will place CIR screen and ask admissions coordinator to weigh in;  Pt will benefit from skilled PT to increase their independence and safety with mobility to allow discharge to the venue listed below.       Follow Up Recommendations CIR;Other (comment)(when appropriate)    Equipment Recommendations  Other (comment)(to be determined)    Recommendations for Other Services Other (comment)(will consider rehab consult)     Precautions / Restrictions Precautions Precautions: Fall      Mobility  Bed Mobility Overal bed mobility: Needs Assistance Bed Mobility: Supine to Sit;Sit to Supine     Supine to sit: Total assist;+2 for physical assistance Sit to supine: Total assist;+2 for physical assistance   General bed mobility comments: +2 physical assistance for all aspects of bed mobility;  Sat EOB for approx 5 minutes with max support  Transfers                 General transfer comment: Opted to lay back down, given previously undocumented L sided weakness  Ambulation/Gait                Stairs            Wheelchair Mobility    Modified Rankin (Stroke Patients Only)       Balance Overall balance assessment: Needs assistance   Sitting balance-Leahy Scale: Zero                                       Pertinent Vitals/Pain Pain Assessment: Faces Faces Pain Scale: Hurts a little bit Pain Location: Noted slight flinch to deep nail bed pressure L hand; minimal withdrawal of UE from pain Pain Descriptors / Indicators: Grimacing(brief) Pain Intervention(s): Monitored during session    Home Living Family/patient expects to be discharged to:: Private residence Living Arrangements: Children;Spouse/significant other Available Help at Discharge: Family;Available 24 hours/day Type of Home: House Home Access: Stairs to enter Entrance Stairs-Rails: None Entrance Stairs-Number of Steps: 1 Home Layout: One level Home Equipment: Walker - 2 wheels;Cane - single point;Shower seat;Shower seat - built in Additional Comments: On PT eval, pt is unable to provide history or home setup; this information is gleaned from PT note from recent admission dated 4/18    Prior Function Level of Independence: Independent         Comments: PLOF taken from PT eval on 4/18; also as of 4/18, pt walked the hallway  without Assistive Device with Supervision      Hand Dominance   Dominant Hand: Right(per chart review)    Extremity/Trunk Assessment   Upper Extremity Assessment Upper Extremity Assessment: RUE deficits/detail;LUE deficits/detail;Defer to OT evaluation RUE Deficits / Details: Able to raise RUE against gravity, and consistenly followed commands (with incr time) with RUE ("lift your arm"; "squeeze my hand") LUE Deficits / Details: No  voluntary movement noted LUE; minimal response to deep nail bed pressure L hand    Lower Extremity Assessment Lower Extremity Assessment: RLE deficits/detail;LLE deficits/detail RLE Deficits / Details: Able to straight leg raise against gravity, bend and straighten knee, to command LLE Deficits / Details: No voluntry movement noted LLE       Communication   Communication: Expressive difficulties;Other (comment)(Delirium)  Cognition Arousal/Alertness: Lethargic;Suspect due to medications(had been agitated and recieved meds) Behavior During Therapy: [facility] for tasks assessed/performed Overall Cognitive Status: Impaired/Different from baseline Area of Impairment: Orientation;Attention;Following commands;Safety/judgement;Awareness;Problem solving                 Orientation Level: Disoriented to;Place;Time;Situation Current Attention Level: Focused   Following Commands: Follows one step commands consistently(R side) Safety/Judgement: Decreased awareness of safety;Decreased awareness of deficits   Problem Solving: Slow processing;Difficulty sequencing;Requires verbal cues;Requires tactile cues General Comments: Noted L gaze preference; able to eventually turn head to R with heavy verbal cueing and manual facilitation of R neck rotation      General Comments General comments (skin integrity, edema, etc.): RN present for large protion of session    Exercises     Assessment/Plan    PT Assessment Patient needs continued PT services  PT Problem List Decreased strength;Decreased range of motion;Decreased activity tolerance;Decreased balance;Decreased mobility;Decreased coordination;Decreased cognition;Decreased knowledge of use of DME;Decreased safety awareness;Decreased knowledge of precautions;Impaired tone       PT Treatment Interventions DME instruction;Gait training;Functional mobility training;Therapeutic activities;Therapeutic exercise;Balance training;Neuromuscular  re-education;Cognitive remediation;Patient/family education    PT Goals (Current goals can be found in the Care Plan section)  Acute Rehab PT Goals Patient Stated Goal: unable PT Goal Formulation: Patient unable to participate in goal setting Time For Goal Achievement: 02/11/18 Potential to Achieve Goals: Good    Frequency Min 4X/week   Barriers to discharge Decreased caregiver support Per chart review, pt's wife has an LE amputation    Co-evaluation               AM-PAC PT "6 Clicks" Daily Activity  Outcome Measure Difficulty turning over in bed (including adjusting bedclothes, sheets and blankets)?: Unable Difficulty moving from lying on back to sitting on the side of the bed? : Unable Difficulty sitting down on and standing up from a chair with arms (e.g., wheelchair, bedside commode, etc,.)?: Unable Help needed moving to and from a bed to chair (including a wheelchair)?: Total Help needed walking in hospital room?: Total Help needed climbing 3-5 steps with a railing? : Total 6 Click Score: 6    End of Session Equipment Utilized During Treatment: Other (comment);Oxygen(bed pad) Activity Tolerance: Other (comment)(Notified MD re: apparent new L sided weakness) Patient left: in bed;with call bell/phone within reach;with nursing/sitter in room;Other (comment)(MD in room ordereing MRI) Nurse Communication: Mobility status;Other (comment)(concern for neurologic event) PT Visit Diagnosis: Other symptoms and signs involving the nervous system (R29.898);Hemiplegia and hemiparesis Hemiplegia - Right/Left: Left Hemiplegia - dominant/non-dominant: Non-dominant Hemiplegia - caused by: Unspecified(MRI pending)    Time: 2458-0998 PT Time Calculation (min) (ACUTE ONLY): 21 min   Charges:   PT Evaluation $PT Eval  Moderate Complexity: 1 Mod     PT G Codes:        Roney Marion, PT  Acute Rehabilitation Services Pager [phone] Office [phone]   [place] 01/28/2018, 4:18 PM

## 2018-01-28 NOTE — Procedures (Signed)
History: 77 yo M with a history of status epilepticus  Sedation: Ativan 1mg   Technique: This is a 21 channel routine scalp EEG performed at the bedside with bipolar and monopolar montages arranged in accordance to the international 10/20 system of electrode placement. One channel was dedicated to EKG recording.    Background: The background is asymmetric with a PDR as well as some irregular delta present on the left. On the right, There is rhythmic sharp activity with a waxing/waning pattern between a LPDs+fast activity appearance evolving to sharply contoured fast activity with spread in field as well. This activity is maximal in the right posterior quadrant, but with a wide field.   Photic stimulation: Physiologic driving is not performed  EEG Abnormalities: 1) Focal status epilepticus in the right hemisphere.   Clinical Interpretation: This EEG demonstrates focal status epilepticus in the right hemisphere.   Roland Rack, MD [facility] [phone]  If 7pm- 7am, please page neurology on call as listed in [place].

## 2018-01-28 NOTE — Significant Event (Addendum)
Rapid Response Event Note  Overview: Time Called: 0071 Arrival Time: 1500 Event Type: Neurologic  Initial Focused Assessment: Patient admitted with seizures. LKW this am at 0800 per RN able to move his left side. Per RN PT at bedside working with patient about 2pm noted that he had some left side weakness. Code Stroke called at 1500  Interventions: Dr Leonel Ramsay at bedside to assess patient IV team placed 18ga IV right ac CT perfusion done  Plan EEG         1 gram Keppra stat  1616: 1mg  Ativan given per orders 1737: Patient somnolent, unresponsive post ativan.  Plan transfer to 4N for intubation  Plan of Care (if not transferred): Son at bedside during event. Dr Leonel Ramsay to update him regarding results.  Event Summary: Name of Physician Notified: [place] at 60  Name of Consulting Physician Notified: Leonel Ramsay at bedside prior to my arrival at    Outcome: Stayed in room and stabalized     Lilyan Punt, [place]

## 2018-01-28 NOTE — Clinical Social Work Note (Signed)
Clinical Social Work Assessment  Patient Details  Name: Danny Krause MRN: [id number] Date of Birth: Jul 03, 1941  Date of referral:  01/28/18               Reason for consult:  Facility Placement                Permission sought to share information with:  Family Supports Permission granted to share information::  Yes, Release of Information Signed  Name::     Physicist, medical::     Relationship::  Daughter   Contact Information:     Housing/Transportation Living arrangements for the past 2 months:  Single Family Home Source of Information:  Adult Children Patient Interpreter Needed:  None Criminal Activity/Legal Involvement Pertinent to Current Situation/Hospitalization:  No - Comment as needed Significant Relationships:  Adult Children Lives with:  Spouse Do you feel safe going back to the place where you live?  No Need for family participation in patient care:  No (Coment)  Care giving concerns:  Pt is only alert to self. CSW spoke with pt's daughter via telephone. Pt lives at home and is the primary caregiver for his wife who is a amputee.    Social Worker assessment / plan:  CSW spoke with pt's daughter via telephone. Pt's daughter explained pt is a caregiver for her mom who is a amputee. CSW explained reaching out to pt's family prematurely given the idea that pt will more than likely need SNF at d/c--pt's daughter appreciative. Pt's daughter states her mom has been to a SNF in the past and pt's daughter would like a list to start looking. Pt's daughter unable to come to the hospital because she is caring for her mom however is able to receive email and would prefer CSW email a copy of the list. CSW emailed lis to [email or web address]  Employment status:  Retired Insurance underwriter information:  Medicare PT Recommendations:  Not assessed at this time Information / Referral to community resources:  [place]  Patient/Family's Response to care:  Pt's daughter  verbalized understanding of CSW role and expressed appreciation for support. Pt's daughterdenies any concern regarding pt care at this time.   Patient/Family's Understanding of and Emotional Response to Diagnosis, Current Treatment, and Prognosis:  Pt's daughter understanding and realistic regarding pt's current physical limitations. Pt understands pt may need SNF at d/c. Pt's daughter request a list in order to start looking for ideal facilities.  Pt's daughter denies any concern regarding pt's treatment plan at this time. CSW will continue to provide support and facilitate d/c needs.   Emotional Assessment Appearance:  Appears stated age Attitude/Demeanor/Rapport:  Unable to Assess Affect (typically observed):  Unable to Assess Orientation:  Oriented to Self Alcohol / Substance use:  Not Applicable Psych involvement (Current and /or in the community):  No (Comment)  Discharge Needs  Concerns to be addressed:  Care Coordination, Basic Needs Readmission within the last 30 days:  No Current discharge risk:  Dependent with Mobility Barriers to Discharge:  Continued Medical Work up   [employer], LCSW 01/28/2018, 12:52 PM

## 2018-01-28 NOTE — Care Management Important Message (Signed)
Important Message  Patient Details  Name: Danny Krause MRN: [id number] Date of Birth: 12-20-1940   Medicare Important Message Given:  Yes    Kyann Heydt P [place] 01/28/2018, 12:45 PM

## 2018-01-28 NOTE — Progress Notes (Signed)
PROGRESS NOTE                                                                                                                                                                                                             Patient Demographics:    Danny Krause, is a 77 y.o. male, DOB - 03-13-1941, QIO:[id number]  Admit date - 01/24/2018   Admitting Physician Kandice Hams, MD  Outpatient Primary MD for the patient is Jani Gravel, MD  LOS - 3   Chief Complaint  Patient presents with  . 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  First diagnosed with seizures in 12/2017 and discharged home on Keppra. Questionable compliance at home.  Found unresponsive at home with seizure like activity.  Intubated in ED for hypercapneic respiratory failure.Extubated on 5/24, currently further hospital course was complicated by delirium and encephalopathy.   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 -He has been lethargic over the last couple days secondary to delirium and medications, his medication regimen has been changed to IV, will be able to change to p.o. if mentation continues to improve . -Patient was noted to have dense left-sided weakness, they have stat MRI brain, and neurology were reconsulted.  Metabolic encephalopathy -His mentation has improved, he is more awake, -Significantly obtunded yesterday as he received some IV Ativan and Haldol the night before, mentation has been improved . -As  well he had some post ictal encephalopathy.  Acute hypercapnic respiratory failure  -Due to status epilepticus, required intubation for airway protection, successfully extubated,  -Oxygen at nighttime  COPD  -no Wheezing, continue with as needed nebs   Hypertension  -Pressure is elevated, but given his encephalopathy cannot give any p.o. medications, continue with as needed hydralazine, and resume on home regimen when he is more awake  History CVA -continue with aspirin  Code Status : Full  Family Communication  : None at bedside  Disposition Plan  : SNF when stable  Consults  :  PCCM, neurolgoy  Procedures  : Intubation/extubation, EEG  DVT Prophylaxis  : [place] heparin  Lab Results  Component Value Date   PLT 285 01/28/2018    Antibiotics  :   Anti-infectives (From admission, onward)   None        Objective:   Vitals:   01/28/18 0010 01/28/18 0403 01/28/18 0859 01/28/18 1112  BP: (!) 156/84 (!) 153/96 (!) 171/85 (!) 174/96  Pulse: 90 90 100 85  Resp: 18 18 20 20   Temp: 98.6 F (37 C) 99 F (37.2 C) 98.3 F (36.8 C) 100.1 F (37.8 C)  TempSrc: Oral Oral Oral Oral  SpO2: 97% 97% 96% 97%  Weight:      Height:        Wt Readings from Last 3 Encounters:  01/26/18 66.3 kg (146 lb 2.6 oz)  01/24/18 70.6 kg (155 lb 9.6 oz)  01/10/18 68.9 kg (151 lb 12.8 oz)     Intake/Output Summary (Last 24 hours) at 01/28/2018 1433 Last data filed at 01/28/2018 0907 Gross per 24 hour  Intake 160 ml  Output 300 ml  Net -140 ml     Physical Exam  She is more awake today, remains significantly confused . Symmetrical Chest wall movement, Good air movement bilaterally, CTAB RRR,No Gallops,Rubs or new Murmurs, No Parasternal Heave +ve B.Sounds, Abd Soft, No tenderness, No rebound - guarding or rigidity. No Cyanosis, Clubbing or edema, No new Rash or bruise, has dense left-sided weakness.    Data Review:    CBC Recent Labs  Lab 01/24/18 2313  01/26/18 0342 01/28/18 0356  WBC 15.6* 8.9 11.2*  HGB 10.9* 9.6* 11.0*  HCT 35.7* 32.9* 35.1*  PLT 253 186 285  MCV 82.4 82.9 76.8*  MCH 25.2* 24.2* 24.1*  MCHC 30.5 29.2* 31.3  RDW 15.0 14.9 14.5  LYMPHSABS 3.0 0.9  --   MONOABS 0.8 0.8  --   EOSABS 0.7 0.1  --   BASOSABS 0.0 0.0  --     Chemistries  Recent Labs  Lab 01/24/18 2313 01/26/18 0342 01/28/18 0356  NA 138 137 134*  K 4.1 3.8 3.2*  CL 101 102 96*  CO2 27 27 25   GLUCOSE 206* 120* 100*  BUN 18 16 21*  CREATININE 1.63* 0.97 0.99  CALCIUM 9.0 8.9 9.1  AST 33  --   --   ALT 21  --   --   ALKPHOS 65  --   --   BILITOT 0.2*  --   --    ------------------------------------------------------------------------------------------------------------------ No results for input(s): CHOL, HDL, LDLCALC, TRIG, CHOLHDL, LDLDIRECT in the last 72 hours.  Lab Results  Component Value Date   HGBA1C 5.9 (H) 07/03/2017   ------------------------------------------------------------------------------------------------------------------ No results for input(s): TSH, T4TOTAL, T3FREE, THYROIDAB in the last 72 hours.  Invalid input(s): FREET3 ------------------------------------------------------------------------------------------------------------------ No results for input(s): VITAMINB12, FOLATE, FERRITIN, TIBC, IRON, RETICCTPCT in the last 72 hours.  Coagulation profile No results for input(s): INR, PROTIME in the last 168 hours.  No results for input(s): DDIMER in the last 72 hours.  Cardiac Enzymes No results for input(s): CKMB, TROPONINI, MYOGLOBIN in the last 168 hours.  Invalid input(s): CK ------------------------------------------------------------------------------------------------------------------ No results found for: BNP  Inpatient Medications  Scheduled Meds: . aspirin  325 mg Oral Daily  . heparin  5,000 Units Subcutaneous Q8H  . irbesartan  300  mg Oral Daily  . mometasone-formoterol  2 puff  Inhalation BID  . phenytoin (DILANTIN) IV  100 mg Intravenous Q8H  . QUEtiapine  25 mg Oral QHS   Continuous Infusions: . levETIRAcetam Stopped (01/28/18 0835)   PRN Meds:.docusate, hydrALAZINE, ipratropium-albuterol  Micro Results Recent Results (from the past 240 hour(s))  MRSA PCR Screening     Status: None   Collection Time: 01/25/18  4:26 AM  Result Value Ref Range Status   MRSA by PCR NEGATIVE NEGATIVE Final    Comment:        The GeneXpert MRSA Assay (FDA approved for NASAL specimens only), is one component of a comprehensive MRSA colonization surveillance program. It is not intended to diagnose MRSA infection nor to guide or monitor treatment for MRSA infections. Performed at [facility], [place] [address] [place], [place] [address]     Radiology Reports Ct Head Wo Contrast  Result Date: 01/25/2018 CLINICAL DATA:  Altered LOC EXAM: CT HEAD WITHOUT CONTRAST TECHNIQUE: Contiguous axial images were obtained from the base of the skull through the vertex without intravenous contrast. COMPARISON:  None. FINDINGS: Brain: No acute territorial infarction, hemorrhage or intracranial mass. Mild small vessel ischemic changes of the white matter. Small focus of encephalomalacia in the right frontal lobe consistent with old infarct. Mild atrophy. Nonenlarged ventricles. Vascular: No hyperdense vessels.  Carotid vascular calcification Skull: Normal. Negative for fracture or focal lesion. Sinuses/Orbits: Mucosal thickening in the ethmoid sinuses. No acute orbital abnormality. Other: None IMPRESSION: 1. No CT evidence for acute intracranial abnormality. 2. Atrophy and small vessel ischemic changes of the white matter. Old right frontal lobe infarct Electronically Signed   By: Donavan Foil M.D.   On: 01/25/2018 00:33   Dg Chest Portable 1 View  Result Date: 01/25/2018 CLINICAL DATA:  Post intubation. EXAM: PORTABLE CHEST 1 VIEW COMPARISON:  Chest radiograph August 21, 2017 FINDINGS:  Endotracheal tube tip projects 2 cm above the carina. Nasogastric tube side port at GE junction region, distal tip out of field-of-view. Stable cardiomegaly. Status post median sternotomy for cardiac valve replacement. Mildly calcified aortic arch. Pulmonary vascular congestion without pleural effusion or focal consolidation. No pneumothorax. Soft tissue planes included osseous structures are nonsuspicious. IMPRESSION: Endotracheal tube tip projects 2 cm above the carina. Nasogastric tube side port at GE junction region, distal tip out of field-of-view. Stable cardiomegaly with pulmonary vascular congestion. Aortic Atherosclerosis (ICD10-I70.0). Electronically Signed   By: Elon Alas M.D.   On: 01/25/2018 02:36   Dg Abd Portable 1 View  Result Date: 01/25/2018 CLINICAL DATA:  Orogastric tube placement. EXAM: PORTABLE ABDOMEN - 1 VIEW COMPARISON:  None. FINDINGS: Side port projecting at GE junction region, distal tip in proximal stomach region. Included bowel gas pattern is nondilated and nonobstructive. No intra-abdominal mass effect or pathologic calcifications. Soft tissue planes and included osseous structures are nonsuspicious. IMPRESSION: Nasogastric tube tip projects in proximal stomach. Electronically Signed   By: Elon Alas M.D.   On: 01/25/2018 02:37     Phillips Climes M.D on 01/28/2018 at 2:33 PM  Between 7am to 7pm - Pager - [phone]  After 7pm go to [email or web address] - password [facility]  [facility] -  Office  [phone]

## 2018-01-28 NOTE — Progress Notes (Signed)
Subjective: Paitent was seen to lift his left side this AM around 8:30, then was noticed to subsequently have severe left sided weakness.   A code stroke was activated and the patient was taken for a stat CT perfusion.  I accompanied the patient durign this. This was negative for acute ischemia, or large vessel occlusion.  He is ultimately developed intermittent left gaze deviation raising concern for possible status epilepticus.  Exam: Vitals:   01/28/18 1112 01/28/18 1445  BP: (!) 174/96 (!) 182/82  Pulse: 85 88  Resp: 20 20  Temp: 100.1 F (37.8 C)   SpO2: 97% 98%   Gen: In bed, NAD Resp: non-labored breathing, no acute distress Abd: soft, nt  Neuro: MS: Awake, alert, oriented to month, year, severe left hemineglect CN: Left facial weakness, left hemianopia, initially EOMI, but subsequently developed left gaze preference Motor: 2-3/5 weakness throughout the left side, intact in the right Sensory: Significant left neglect  Pertinent Labs: BMP- mild hyponatremia  Impression: 77 year old male with recurrent seizures resulting in left-sided weakness and neglect.  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 Ongoign satus epilepticus is also possible, but there are no positive symptoms to suggest that at this time.  Recommendations: 1) additional Keppra 1 g IV x1 stat 2) ativan 1mg  x 1 3) STAT EEG 4) Will follow.   This patient is critically ill and at significant risk of neurological worsening, death and care requires constant monitoring of vital signs, hemodynamics,respiratory and cardiac monitoring, neurological assessment, discussion with family, other specialists and medical decision making of high complexity. I spent 50 minutes of neurocritical care time  in the care of  this patient.  Roland Rack, MD [facility] [phone]  If 7pm- 7am, please page neurology on call as listed in  [place]. 01/28/2018  4:19 PM

## 2018-01-28 NOTE — Progress Notes (Signed)
eLink Physician-Brief Progress Note Patient Name: Danny Krause DOB: [birth year] MRN: [id number]   Date of Service  01/28/2018  HPI/Events of Note  Patient is intubated and ventilated. Notified of need for Stress Ulcer Prophylaxis.   eICU Interventions  Will order: 1. Pepcid 20 mg IV now and Q 12 hours.      Intervention Category Intermediate Interventions: Best-practice therapies (e.g. DVT, beta blocker, etc.)  Jarielys Girardot [place] 01/28/2018, 10:16 PM

## 2018-01-28 NOTE — Procedures (Signed)
Central venous catheter placement.  Indication: Anticipated need for pressors as we sedated to burst suppression.  Procedure: Both intubation and line placement have been discussed with Danny Krause son prior to proceeding.  The patient was already sedated therefore no local was employed.  The area over the left subclavian was extensively prepped with chlorhexidine and widely draped.  The subclavian vein was easily cannulated and a wire gently passed.  The tract was dilated and a 20 cm 7 French triple-lumen catheter placed to 20 cm.  There was good flow from all ports.  The catheter was sutured in place.  There was more than usual oozing from both the insertion site and the suture sites.  Chest x-ray for placement is pending

## 2018-01-28 NOTE — Procedures (Signed)
Intubation  Indication: Deep sedation for control of status epilepticus  Procedure: Danny Krause son was made aware procedures necessary to induce deep sedation for burst suppression and agreed with proceeding.  The patient was transferred to the intensive care unit a timeout was performed and then the patient was preoxygenated with an Ambu bag.  There were a fair amount of secretions in the upper airway initially making visualization difficult, the patient was extensively suctioned and I switched from a Miller to a MAC blade, this allowed much better visualization and a 7.5 endotracheal tube was passed to 23 cm at the lip.  There were substantial purulent endotracheal secretions. There was symmetric air movement and a positive CO2. Chest x-ray for placement is pending

## 2018-01-28 NOTE — Progress Notes (Addendum)
Dr Leonel Ramsay in to see pt at this time and wants to have pt intubated to protect his airway.  Pt sleeping heavily, has ativan IV on board that was given at 1616 by rapid response RN that was assisting in care of pt when code stroke had been called, (CT negative for stroke per report. Dr Leonel Ramsay states that it's most likely seizures and ordered an EEG for pt)  Family (son) at bedside and Dr Leonel Ramsay explained to him the need to protect pt's airway.    Report called to RN Atonya Templer B on unit 4North ICU, pt going to [id number].  Pt transferred via bed with O2 on, escorted by rapid response RN. Son given unit and room number.

## 2018-01-29 DIAGNOSIS — J9622 Acute and chronic respiratory failure with hypercapnia: Secondary | ICD-10-CM

## 2018-01-29 LAB — CBC WITH DIFFERENTIAL/PLATELET
Abs Immature Granulocytes: 0.1 10*3/uL (ref 0.0–0.1)
BASOS ABS: 0.1 10*3/uL (ref 0.0–0.1)
BASOS PCT: 1 %
EOS PCT: 3 %
Eosinophils Absolute: 0.3 10*3/uL (ref 0.0–0.7)
HCT: 32.8 % — ABNORMAL LOW (ref 39.0–52.0)
Hemoglobin: 10.2 g/dL — ABNORMAL LOW (ref 13.0–17.0)
Immature Granulocytes: 1 %
Lymphocytes Relative: 18 %
Lymphs Abs: 1.6 10*3/uL (ref 0.7–4.0)
MCH: 24.5 pg — AB (ref 26.0–34.0)
MCHC: 31.1 g/dL (ref 30.0–36.0)
MCV: 78.7 fL (ref 78.0–100.0)
MONO ABS: 1.2 10*3/uL — AB (ref 0.1–1.0)
Monocytes Relative: 13 %
Neutro Abs: 6.1 10*3/uL (ref 1.7–7.7)
Neutrophils Relative %: 64 %
PLATELETS: 291 10*3/uL (ref 150–400)
RBC: 4.17 MIL/uL — ABNORMAL LOW (ref 4.22–5.81)
RDW: 14.8 % (ref 11.5–15.5)
WBC: 9.3 10*3/uL (ref 4.0–10.5)

## 2018-01-29 LAB — URINALYSIS, ROUTINE W REFLEX MICROSCOPIC
Bilirubin Urine: NEGATIVE
Glucose, UA: NEGATIVE mg/dL
Hgb urine dipstick: NEGATIVE
KETONES UR: 5 mg/dL — AB
LEUKOCYTES UA: NEGATIVE
NITRITE: NEGATIVE
PH: 5 (ref 5.0–8.0)
PROTEIN: NEGATIVE mg/dL
Specific Gravity, Urine: 1.041 — ABNORMAL HIGH (ref 1.005–1.030)

## 2018-01-29 LAB — MAGNESIUM: Magnesium: 2.4 mg/dL (ref 1.7–2.4)

## 2018-01-29 LAB — COMPREHENSIVE METABOLIC PANEL
ALBUMIN: 3.1 g/dL — AB (ref 3.5–5.0)
ALK PHOS: 53 U/L (ref 38–126)
ALT: 14 U/L — ABNORMAL LOW (ref 17–63)
AST: 17 U/L (ref 15–41)
Anion gap: 9 (ref 5–15)
BILIRUBIN TOTAL: 0.7 mg/dL (ref 0.3–1.2)
BUN: 26 mg/dL — AB (ref 6–20)
CO2: 26 mmol/L (ref 22–32)
Calcium: 8.4 mg/dL — ABNORMAL LOW (ref 8.9–10.3)
Chloride: 99 mmol/L — ABNORMAL LOW (ref 101–111)
Creatinine, Ser: 1.19 mg/dL (ref 0.61–1.24)
GFR calc Af Amer: >60 mL/min (ref >60)
GFR calc non Af Amer: 57 mL/min — ABNORMAL LOW (ref >60)
GLUCOSE: 94 mg/dL (ref 65–99)
POTASSIUM: 2.9 mmol/L — AB (ref 3.5–5.1)
Sodium: 134 mmol/L — ABNORMAL LOW (ref 135–145)
TOTAL PROTEIN: 5.9 g/dL — AB (ref 6.5–8.1)

## 2018-01-29 LAB — BLOOD GAS, ARTERIAL
Acid-Base Excess: 0.2 mmol/L (ref 0.0–2.0)
Bicarbonate: 25 mmol/L (ref 20.0–28.0)
Drawn by: 44135
FIO2: 40
O2 Saturation: 98.9 %
PCO2 ART: 43.8 mmHg (ref 32.0–48.0)
PEEP: 5 cmH2O
Patient temperature: 97.5
RATE: 15 resp/min
VT: 500 mL
pH, Arterial: 7.371 (ref 7.350–7.450)
pO2, Arterial: 143 mmHg — ABNORMAL HIGH (ref 83.0–108.0)

## 2018-01-29 LAB — BASIC METABOLIC PANEL
ANION GAP: 6 (ref 5–15)
BUN: 20 mg/dL (ref 6–20)
CALCIUM: 8.4 mg/dL — AB (ref 8.9–10.3)
CO2: 24 mmol/L (ref 22–32)
Chloride: 109 mmol/L (ref 101–111)
Creatinine, Ser: 1.18 mg/dL (ref 0.61–1.24)
GFR, EST NON AFRICAN AMERICAN: 58 mL/min — AB (ref >60)
Glucose, Bld: 89 mg/dL (ref 65–99)
Potassium: 4.4 mmol/L (ref 3.5–5.1)
Sodium: 139 mmol/L (ref 135–145)

## 2018-01-29 MED ORDER — SODIUM CHLORIDE 0.9% FLUSH: 10.0000 mL | INTRAVENOUS | Status: DC | PRN

## 2018-01-29 MED ORDER — MIDAZOLAM HCL 50 MG/10ML IJ SOLN
20.0000 mg/h | INTRAMUSCULAR | Status: DC
  Administered 2018-01-29 – 2018-02-01 (×10): 20 mg/h via INTRAVENOUS
  Filled 2018-01-29 (×10): qty 20

## 2018-01-29 MED ORDER — SODIUM CHLORIDE 0.9 % IV SOLN
100.0000 mg | Freq: Two times a day (BID) | INTRAVENOUS | Status: DC
  Administered 2018-01-29 (×2): 100 mg via INTRAVENOUS
  Filled 2018-01-29 (×3): qty 10

## 2018-01-29 MED ORDER — CHLORHEXIDINE GLUCONATE CLOTH 2 % EX PADS
6.0000 | MEDICATED_PAD | Freq: Every day | CUTANEOUS | Status: DC
  Administered 2018-01-29 – 2018-02-11 (×12): 6 via TOPICAL

## 2018-01-29 MED ORDER — SODIUM CHLORIDE 0.9% FLUSH
10.0000 mL | Freq: Two times a day (BID) | INTRAVENOUS | Status: DC
  Administered 2018-01-29: 30 mL
  Administered 2018-01-29: 40 mL
  Administered 2018-01-29: 10 mL
  Administered 2018-01-30: 30 mL
  Administered 2018-01-31: 10 mL
  Administered 2018-01-31: 20 mL
  Administered 2018-02-01: 30 mL
  Administered 2018-02-01 – 2018-02-06 (×5): 10 mL
  Administered 2018-02-06: 20 mL
  Administered 2018-02-07 – 2018-02-08 (×3): 10 mL
  Administered 2018-02-08: 30 mL
  Administered 2018-02-09 – 2018-02-10 (×4): 10 mL

## 2018-01-29 MED ORDER — POTASSIUM CHLORIDE 10 MEQ/50ML IV SOLN
10.0000 meq | INTRAVENOUS | Status: AC
  Administered 2018-01-29 (×8): 10 meq via INTRAVENOUS
  Filled 2018-01-29 (×8): qty 50

## 2018-01-29 MED ORDER — SODIUM CHLORIDE 0.9 % IV SOLN
200.0000 mg | Freq: Two times a day (BID) | INTRAVENOUS | Status: DC
  Administered 2018-01-29 (×2): 200 mg via INTRAVENOUS
  Filled 2018-01-29 (×4): qty 4

## 2018-01-29 MED ORDER — LEVETIRACETAM IN NACL 1500 MG/100ML IV SOLN
1500.0000 mg | Freq: Two times a day (BID) | INTRAVENOUS | Status: DC
  Administered 2018-01-29 – 2018-02-09 (×23): 1500 mg via INTRAVENOUS
  Filled 2018-01-29 (×24): qty 100

## 2018-01-29 MED ORDER — LACOSAMIDE 200 MG/20ML IV SOLN
200.0000 mg | INTRAVENOUS | Status: AC
  Administered 2018-01-29: 200 mg via INTRAVENOUS
  Filled 2018-01-29: qty 20

## 2018-01-29 NOTE — Progress Notes (Addendum)
Subjective: Burst-suppression overnight  Exam: Vitals:   01/29/18 0745 01/29/18 0802  BP: (!) 157/66 139/64  Pulse: (!) 47 (!) 50  Resp: 15 15  Temp:    SpO2: 100% 100%   Gen: In bed, intubated Resp: ventilated Abd: soft, nt  Neuro: Limited by induced coma PERRL  Pertinent Labs: EKG- 5/24 normal PR interval.  EEG -BS, but with frequent shaprs in the bursts.   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 He was intubated yesterday after benzodiazepines at low dose were very sedating and his needing increased sedating medications for control of NCSE.   Given that he was being intubated, burst-suppression was pursued.   Recommendations: 1) Continue BS for another 24 hours.  2) increase keppra to 1.5g bid 3) increase phenytoin to 200mg  BID, level tomorrow 4) add vimpat   This patient is critically ill and at significant risk of neurological worsening, death and care requires constant monitoring of vital signs, hemodynamics,respiratory and cardiac monitoring, neurological assessment, discussion with family, other specialists and medical decision making of high complexity. I spent 40 minutes of neurocritical care time  in the care of  this patient.  Roland Rack, MD [facility] [phone]  If 7pm- 7am, please page neurology on call as listed in [place]. 01/29/2018  8:59 AM

## 2018-01-29 NOTE — Progress Notes (Signed)
Danny Krause notified of 674mL bladder scan in bladder and that patient is on burst suppression sedation. She mentioned she would tell eLink Dr for foley order. Foley placed for acute retention.

## 2018-01-29 NOTE — Procedures (Signed)
LTM-EEG Report  HISTORY: Continuous video-EEG monitoring in EMU performed for37 year old with encephalopathy, R F stroke, abnormal EEG.  ACQUISITION: International 10-20 system for electrode placement; 18 channels with additional eyes linked to ipsilateral ears and EKG. Additional T1-T2 electrodes were used. Continuous video recording obtained.   EEG NUMBER: MEDICATIONS:  Day 1: LCM, LEV, PHT, propofol  DAY #1: from 1823 01/28/18 to 0730 01/29/18  BACKGROUND: The record consisted primarily of a burst-suppression pattern from 1900 onwards. Periods of low voltage attenuation lasted 2-8 seconds. Intervening bursts of medium voltage, irregular theta-delta activity lasted 1-3 seconds. There were superimposed right central lateralized periodic discharges (LPDs) seen in the bursts without ictal evolution. During periods of decreased attenuation, the LPDs occurred in runs of 1-2Hz . SEIZURES: none  EVENTS: none reported   EKG: no significant arrhythmia  SUMMARY: This was an abnormal continuous video EEG due to a burst suppression pattern with right central LPDs. The periodic activity did not show ictal evolution. This pattern was consistent with a focal epileptogenic disturbance on the right.

## 2018-01-29 NOTE — Clinical Social Work Note (Signed)
Clinical Social Worker continuing to follow patient and family for support and discharge planning needs.  Patient is now on the ventilator and no appropriate for placement options at this time.  CSW remains available and will follow up with family once discharge disposition is determined.  Barbette Or, [place]

## 2018-01-29 NOTE — Progress Notes (Signed)
EEG maint complete. No skin breakdown. Continue to monitor 

## 2018-01-29 NOTE — Progress Notes (Signed)
Initial Nutrition Assessment  DOCUMENTATION CODES:   Not applicable  INTERVENTION:   If tube feeding required: Vital High Protein @ 10 ml/hr 60 ml Prostat BID  Provides: 640 kcals (1480 kcal with propofol), 81 grams protein, 207 ml free water.   NUTRITION DIAGNOSIS:   Inadequate oral intake related to inability to eat as evidenced by NPO status.  GOAL:   Patient will meet greater than or equal to 90% of their needs  MONITOR:   Diet advancement, Weight trends, Labs, TF tolerance, Vent status, I & O's  REASON FOR ASSESSMENT:   Ventilator    ASSESSMENT:   Patient with PMH significant for right frontal CVA, COPD, CAD s/p CABG and AVR. Recently hospitalized in April for seizure and respiratory failure. Presents this admission with seizure in the setting of severe acidosis and acute respiratory failure requiring intubation.    5/24- extubated 5/27- re-intubated to allow deep sedation to burst suppression  Pt discussed during ICU rounds and with RN.  Remains on pressor support x1.  Spoke with RN. No plans to feed today due to possible ileus. NGT set to LIS.  Propofol to stay at current flat rate until tomorrow.  EEG continues at bedside.   No family at bedside to provide nutrition history.  Weight shows to have decreased 15 lb since admit (151 lb to 136 lb).  Nutrition-Focused physical exam completed. Pt looks to have mild/moderate muscle and fat depletions. Unsure if this is from recent wt loss/loss in appetite or due to advanced age. Will need to obtain more history to diagnosis.   Patient is currently intubated on ventilator support MV: 7.8 L/min Temp (24hrs), Avg:97.6 F (36.4 C), Min:96.1 F (35.6 C), Max:98.7 F (37.1 C) BP: 145/67 MAP: 89 Propofol: 31.8 ml/hr - provided 840 kcal  I/O: +1717 ml since admit UOP: 550 ml x 24 hours  Medications reviewed and include: versed, 10 mEq KCl IV every hour, propofol Labs reviewed: Na 134 (L) K 2.9 (L)   NUTRITION -  FOCUSED PHYSICAL EXAM:    Most Recent Value  Orbital Region  Mild depletion  Upper Arm Region  Mild depletion  Thoracic and Lumbar Region  Unable to assess  Buccal Region  Unable to assess  Temple Region  Moderate depletion  Clavicle Bone Region  Moderate depletion  Clavicle and Acromion Bone Region  Mild depletion  Scapular Bone Region  Unable to assess  Dorsal Hand  Mild depletion  Patellar Region  Moderate depletion  Anterior Thigh Region  Moderate depletion  Posterior Calf Region  Moderate depletion  Edema (RD Assessment)  None  Hair  Reviewed  Eyes  Unable to assess  Mouth  Unable to assess  Skin  Reviewed  Nails  Reviewed     Diet Order:   Diet Order           Diet NPO time specified  Diet effective now          EDUCATION NEEDS:   Not appropriate for education at this time  Skin:  Skin Assessment: Reviewed RN Assessment  Last BM:  01/26/18  Height:   Ht Readings from Last 1 Encounters:  01/28/18 5\' 5"  (1.651 m)    Weight:   Wt Readings from Last 1 Encounters:  01/28/18 136 lb 7.4 oz (61.9 kg)    Ideal Body Weight:  61.8 kg  BMI:  Body mass index is 22.71 kg/m.  Estimated Nutritional Needs:   Kcal:  1362 kcal  Protein:  75- 90 g  Fluid:  > 1.3 L/day    Mariana Single RD, LDN Clinical Nutrition Pager # [phone]

## 2018-01-29 NOTE — Progress Notes (Signed)
PULMONARY / CRITICAL CARE MEDICINE   Name: Danny Krause MRN: [id number] DOB: 09/15/1940    ADMISSION DATE:  01/24/2018 CONSULTATION DATE:  01/24/2018  REFERRING MD:  Maudie Mercury  CHIEF COMPLAINT:  Seizures  HISTORY OF PRESENT ILLNESS:   77 year old man with prior history of seizures on background of prior CVA on home oxygen mainly at night.  First diagnosed with seizures in 12/2017 and discharged home on Keppra. Questionable compliance at home.  Found unresponsive at home with seizure like activity.  Intubated in ED for hypercapneic respiratory failure.  On the floor 5/27 he developed new left-sided weakness and a CT angiogram was performed which showed no evidence of large vessel occlusion.  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  In light of his extremely poor baseline respiratory function and prior CO2 retention during this admission only 1 mg of Ativan was administered on the floor after which there was still seizure activity on the EEG but the patient was profoundly somnolent.  It was elected  elected to transfer him to ICU for intubation to allow deep sedation to burst suppression. There is been no clinical seizure activity overnight and he is currently sedated to burst suppression on 80 mcg of propofol.  He continues to be intubated and mechanically ventilated and is requiring a modest dose of Neo-Synephrine for blood pressure support  PAST MEDICAL HISTORY :  He  has a past medical history of Anginal pain ([place]), Coronary artery disease, Critical aortic valve stenosis, Dyspnea, Murmur, Seizure ([place]), and Syncope.  PAST SURGICAL HISTORY: He  has a past surgical history that includes No past surgeries; RIGHT/LEFT HEART CATH AND CORONARY ANGIOGRAPHY (N/A, 06/25/2017); THORACIC AORTOGRAM (06/25/2017); Aortic valve replacement (N/A, 07/04/2017); Coronary artery bypass graft (N/A, 07/04/2017); and TEE without cardioversion (N/A,  07/04/2017).  No Known Allergies  No current facility-administered medications on file prior to encounter.    Current Outpatient Medications on File Prior to Encounter  Medication Sig  . acetaminophen (TYLENOL) 650 MG CR tablet Take 650 mg by mouth every 8 (eight) hours as needed for pain.  . budesonide-formoterol (SYMBICORT) 160-4.5 MCG/ACT inhaler Inhale 2 puffs into the lungs 2 (two) times daily. (Patient taking differently: Inhale 2 puffs into the lungs daily. )  . levETIRAcetam (KEPPRA) 500 MG tablet Take 2 tablets (1,000 mg total) by mouth 2 (two) times daily.  . metoprolol tartrate (LOPRESSOR) 25 MG tablet TAKE 1/2 TABLET BY MOUTH TWICE A DAY  . olmesartan (BENICAR) 40 MG tablet Take 1 tablet (40 mg total) by mouth daily.  Marland Kitchen albuterol (PROAIR HFA) 108 (90 Base) MCG/ACT inhaler Inhale 2 puffs into the lungs every 6 (six) hours as needed for wheezing or shortness of breath.    FAMILY HISTORY:  His indicated that his mother is deceased. He indicated that his father is deceased. He indicated that the status of his brother is unknown.   SOCIAL HISTORY: He  reports that he quit smoking about 6 months ago. His smoking use included cigarettes. He has a 128.00 pack-year smoking history. He has never used smokeless tobacco. He reports that he does not drink alcohol or use drugs.  REVIEW OF SYSTEMS:   Negative apart from than which is mentioned above.  SUBJECTIVE:   VITAL SIGNS: BP (!) 151/67   Pulse (!) 45   Temp (!) 97.5 F (36.4 C) (Axillary)   Resp 15   Ht 5\' 5"  (1.651 m)   Wt 136 lb 7.4 oz (  61.9 kg)   SpO2 100%   BMI 22.71 kg/m   HEMODYNAMICS:  On no vasopressors.  VENTILATOR SETTINGS: Vent Mode: PRVC FiO2 (%):  [40 %-100 %] 40 % Set Rate:  [15 bmp] 15 bmp Vt Set:  [500 mL] 500 mL PEEP:  [5 cmH20] 5 cmH20 Plateau Pressure:  [11 cmH20-13 cmH20] 12 cmH20  INTAKE / OUTPUT: I/O last 3 completed shifts: In: 2034.8 [P.O.:60; I.V.:1543.2; IV Piggyback:431.6] Out: 775  [Urine:775]  PHYSICAL EXAMINATION: General: Elderly and somewhat cachectic now intubated mechanically ventilated and unresponsive due to deep sedation   Neuro: Pupils are equal and reactive at 2 mm.  The face is symmetric.   HEENT:  No icterus. Cardiovascular: S1 and S2 are very distant and regular without murmur rub or gallop   Lungs: Respirations are unlabored, he is not breathing above the set ventilator rate during my exam.  There is symmetric air movement a few scattered rhonchi and no wheezes.  He is not trapping by flow versus time    Abdomen:  Soft, non-tender. Musculoskeletal:  No visible injuries. Skin:  Intact.  LABS:  BMET Recent Labs  Lab 01/26/18 0342 01/28/18 0356 01/29/18 0238  NA 137 134* 134*  K 3.8 3.2* 2.9*  CL 102 96* 99*  CO2 27 25 26   BUN 16 21* 26*  CREATININE 0.97 0.99 1.19  GLUCOSE 120* 100* 94    Electrolytes Recent Labs  Lab 01/26/18 0342 01/28/18 0356 01/29/18 0238  CALCIUM 8.9 9.1 8.4*  MG  --   --  2.4    CBC Recent Labs  Lab 01/26/18 0342 01/28/18 0356 01/29/18 0238  WBC 8.9 11.2* 9.3  HGB 9.6* 11.0* 10.2*  HCT 32.9* 35.1* 32.8*  PLT 186 285 291    Coag's No results for input(s): APTT, INR in the last 168 hours.  Sepsis Markers Recent Labs  Lab 01/25/18 0449 01/26/18 0342  PROCALCITON 0.17 0.25    ABG Recent Labs  Lab 01/25/18 0310 01/25/18 0623 01/29/18 0439  PHART 7.295* 7.399 7.371  PCO2ART 55.7* 40.7 43.8  PO2ART 63.6* 79.0* 143*    Liver Enzymes Recent Labs  Lab 01/24/18 2313 01/29/18 0238  AST 33 17  ALT 21 14*  ALKPHOS 65 53  BILITOT 0.2* 0.7  ALBUMIN 4.4 3.1*    Cardiac Enzymes No results for input(s): TROPONINI, PROBNP in the last 168 hours.  Glucose Recent Labs  Lab 01/25/18 0432 01/25/18 0735 01/25/18 1140 01/25/18 1539 01/25/18 2008 01/28/18 1450  GLUCAP 200* 127* 102* 137* 128* 104*    Imaging Ct Angio Head W Or Wo Contrast  Result Date: 01/28/2018 CLINICAL DATA:  LEFT  hemiparesis and neglect. Patient with recurrent status epilepticus. EXAM: CT ANGIOGRAPHY HEAD AND NECK TECHNIQUE: Multidetector CT imaging of the head and neck was performed using the standard protocol during bolus administration of intravenous contrast. Multiplanar CT image reconstructions and MIPs were obtained to evaluate the vascular anatomy. Carotid stenosis measurements (when applicable) are obtained utilizing NASCET criteria, using the distal internal carotid diameter as the denominator. CONTRAST:  135mL ISOVUE-370 IOPAMIDOL (ISOVUE-370) INJECTION 76% COMPARISON:  CT head 12/18/2017. CT head 01/28/2018. perfusion reported separately. FINDINGS: CTA NECK Aortic arch: Standard branching. Imaged portion shows no evidence of aneurysm or dissection. No significant stenosis of the major arch vessel origins. Right carotid system: No evidence of dissection, stenosis (50% or greater) or occlusion. Left carotid system: No evidence of dissection, stenosis (50% or greater) or occlusion. Vertebral arteries: LEFT vertebral dominant. No evidence of dissection, stenosis (  50% or greater) or occlusion. Nonvascular soft tissues: COPD, widespread emphysematous change. No neck masses. Spondylosis. Edentulous. Aortic atherosclerosis. CTA HEAD Anterior circulation: Cavernous carotid calcification. No significant stenosis, proximal occlusion, aneurysm, or vascular malformation. Posterior circulation: Both vertebrals contribute to basilar formation. No significant stenosis, proximal occlusion, aneurysm, or vascular malformation. Venous sinuses: As permitted by contrast timing, patent. Anatomic variants: None of significance. Delayed phase:   No abnormal intracranial enhancement. Review of the MIP images confirms the above findings IMPRESSION: No intracranial or extracranial flow reducing stenosis or emergent large vessel occlusion. Aortic Atherosclerosis (ICD10-I70.0) and Emphysema (ICD10-J43.9). Electronically Signed   By: Staci Righter M.D.   On: 01/28/2018 16:20   Ct Angio Neck W Or Wo Contrast  Result Date: 01/28/2018 CLINICAL DATA:  LEFT hemiparesis and neglect. Patient with recurrent status epilepticus. EXAM: CT ANGIOGRAPHY HEAD AND NECK TECHNIQUE: Multidetector CT imaging of the head and neck was performed using the standard protocol during bolus administration of intravenous contrast. Multiplanar CT image reconstructions and MIPs were obtained to evaluate the vascular anatomy. Carotid stenosis measurements (when applicable) are obtained utilizing NASCET criteria, using the distal internal carotid diameter as the denominator. CONTRAST:  147mL ISOVUE-370 IOPAMIDOL (ISOVUE-370) INJECTION 76% COMPARISON:  CT head 12/18/2017. CT head 01/28/2018. perfusion reported separately. FINDINGS: CTA NECK Aortic arch: Standard branching. Imaged portion shows no evidence of aneurysm or dissection. No significant stenosis of the major arch vessel origins. Right carotid system: No evidence of dissection, stenosis (50% or greater) or occlusion. Left carotid system: No evidence of dissection, stenosis (50% or greater) or occlusion. Vertebral arteries: LEFT vertebral dominant. No evidence of dissection, stenosis (50% or greater) or occlusion. Nonvascular soft tissues: COPD, widespread emphysematous change. No neck masses. Spondylosis. Edentulous. Aortic atherosclerosis. CTA HEAD Anterior circulation: Cavernous carotid calcification. No significant stenosis, proximal occlusion, aneurysm, or vascular malformation. Posterior circulation: Both vertebrals contribute to basilar formation. No significant stenosis, proximal occlusion, aneurysm, or vascular malformation. Venous sinuses: As permitted by contrast timing, patent. Anatomic variants: None of significance. Delayed phase:   No abnormal intracranial enhancement. Review of the MIP images confirms the above findings IMPRESSION: No intracranial or extracranial flow reducing stenosis or emergent large  vessel occlusion. Aortic Atherosclerosis (ICD10-I70.0) and Emphysema (ICD10-J43.9). Electronically Signed   By: Staci Righter M.D.   On: 01/28/2018 16:20   Ct Cerebral Perfusion W Contrast  Addendum Date: 01/28/2018   ADDENDUM REPORT: 01/28/2018 19:09 ADDENDUM: Following review with the ordering provider, we discussed the relative increased cerebral blood flow and cerebral blood volume in the RIGHT hemisphere as compared to the LEFT, which in this patient, correlates with suspected status/epileptiform activity. Electronically Signed   By: Staci Righter M.D.   On: 01/28/2018 19:09   Result Date: 01/28/2018 CLINICAL DATA:  LEFT-sided hemiparesis and neglect. History of seizure disorder. EXAM: CT PERFUSION BRAIN TECHNIQUE: Multiphase CT imaging of the brain was performed following IV bolus contrast injection. Subsequent parametric perfusion maps were calculated using RAPID software. CONTRAST:  149mL ISOVUE-370 IOPAMIDOL (ISOVUE-370) INJECTION 76% COMPARISON:  CTA head neck reported separately. FINDINGS: CT Brain Perfusion Findings: CBF (<30%) Volume: 19mL Perfusion (Tmax>6.0s) volume: 67mL Mismatch Volume: 64mL ASPECTS on noncontrast CT Head: 9 at 3:46 p.m. today. Infarct Core: 0 mL Infarction Location:Not applicable IMPRESSION: Negative CT perfusion. Electronically Signed: By: Staci Righter M.D. On: 01/28/2018 16:27   Dg Chest Port 1 View  Result Date: 01/28/2018 CLINICAL DATA:  Intubated.  Central line placement. EXAM: PORTABLE CHEST 1 VIEW COMPARISON:  12/20/2017. FINDINGS: Endotracheal tube in  satisfactory position. Nasogastric tube extending into the stomach. Left subclavian catheter with its tip in the proximal superior vena cava. No pneumothorax. Post CABG changes. Normal sized heart. Clear lungs. The lungs are mildly hyperexpanded with mildly prominent interstitial markings. Diffuse osteopenia. IMPRESSION: 1. Satisfactory position of the endotracheal tube and left subclavian catheter. 2. Mild changes of  COPD. Electronically Signed   By: Claudie Revering M.D.   On: 01/28/2018 19:21   Dg Abd Portable 1v  Result Date: 01/28/2018 CLINICAL DATA:  Nasogastric tube placement. EXAM: PORTABLE ABDOMEN - 1 VIEW COMPARISON:  Portable chest obtained at the same time. FINDINGS: Nasogastric tube tip and side hole in the proximal stomach. Several borderline dilated loops of jejunum. Mild lumbar spine degenerative changes and mild scoliosis. IMPRESSION: 1. Nasogastric tube tip and side hole in the proximal stomach. 2. Minimal jejunal ileus or partial obstruction. Electronically Signed   By: Claudie Revering M.D.   On: 01/28/2018 19:23   Ct Head Code Stroke Wo Contrast`  Result Date: 01/28/2018 CLINICAL DATA:  Code stroke.  LEFT-sided hemiparesis and neglect. EXAM: CT HEAD WITHOUT CONTRAST TECHNIQUE: Contiguous axial images were obtained from the base of the skull through the vertex without intravenous contrast. COMPARISON:  CT head 12/18/2017.  MR head 03/14/2017. FINDINGS: Brain: Asymmetry of the RIGHT insula as compared to the LEFT, see series 3, image 14, possible acute infarction. No definite lentiform nucleus involvement. Chronic RIGHT frontal cortex infarction. No hemorrhage, mass lesion, hydrocephalus, or extra-axial fluid. Mild atrophy. Hypoattenuation of white matter, likely small vessel disease. Vascular: Heavily calcified cavernous and supraclinoid internal carotid artery vessels. No definite emergent large vessel occlusion. Skull: Intact. Sinuses/Orbits: Negative. Other: None. ASPECTS [facility] Stroke Program Early CT Score) - Ganglionic level infarction (caudate, lentiform nuclei, internal capsule, insula, M1-M3 cortex): 6. RIGHT insula abnormal. - Supraganglionic infarction (M4-M6 cortex): 3 Total score (0-10 with 10 being normal): 9 IMPRESSION: 1. Concern for RIGHT hemisphere infarct, with asymmetric insular hypoattenuation not present previously. 2. No signs of large vessel occlusion. 3. ASPECTS is 9. 4. These  results were communicated to Dr. Leonel Ramsay at 3:46 pmon 5/27/2019by text page via the [facility] messaging system. Electronically Signed   By: Staci Righter M.D.   On: 01/28/2018 15:48     STUDIES:  EEG showed:  This sedated EEG is abnormal due to the presence of: 1. Moderate diffuse background slowing 2. Lateralized periodic discharges (LPDs) over the right frontotemporal region - consistent with possible epileptogenic focus. 3. Brief 5-second period of rhythmic theta activity over the right temporal region without evolution  CULTURES: negative  ANTIBIOTICS: No antibiotics  SIGNIFICANT EVENTS: Extubated 5/24.  Seen by Neurology who recommends continuing dual anticonvulsant therapy until non-compliance can be confirmed. Reintubated 5/27 in order to allow deep sedation to burst suppression  LINES/TUBES: PIV's only.  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  He also has a history of O2 dependent chronic lung disease.  ASSESSMENT / PLAN:  PULMONARY A: Chronic O2 dependent lung disease with hypercarbia following sedation for seizure control earlier in this admission requiring intubation and mechanical ventilation.  He was readmitted to allow deep sedation for seizure control.  He is not bronchospastic on exam at present or by flow versus time.  And continuing him on the combination of duo nebs and Dulera.  There is a suspicion of aspiration and he continues  on Zosyn.  Follow-up chest x-ray has been scheduled for the morning. P:   Resume home COPD regimen. Progressive ambulation.  CARDIOVASCULAR A:  Hypertension s/p AVR P:  He is chronically on Avapro for blood pressure control.  He is requiring a modest dose of Neo-Synephrine to maintain his blood pressure while sedated with propofol to burst suppression.  I have decreased his  dose of Avapro today.      RENAL A:   Volume Status: Clinically euvolemic, even fluid balance. P:   Allow autoregulation. Foley: removed.  GASTROINTESTINAL A:   Nutritional Status:  Moderate nutritional risk. P:    HEMATOLOGIC A:   DVT prophylaxis: UFH; Transfusion requirements:none   INFECTIOUS A:   No signs of infection, no leukocytosis. P:   Dated 5/27   ENDOCRINE A:   Euglycemic   Glycemic control: No coverage required; Stress steroids: none P:   D/C CBG.  NEUROLOGIC A:   Seizure disorder, prior stroke.    He is sedated to burst suppression with propofol.  He continues Vimpat Keppra and Dilantin.  Dilantin level is pending for the morning  Greater than 32 minutes was spent in the care of this acutely ill patient today  Lars Masson, MD [place] Pager: [phone] After hours [phone]  01/29/2018, 9:53 AM

## 2018-01-29 NOTE — Progress Notes (Signed)
[facility] ADULT ICU REPLACEMENT PROTOCOL FOR AM LAB REPLACEMENT ONLY  The patient does apply for the [facility] Adult ICU Electrolyte Replacment Protocol based on the criteria listed below:   1. Is GFR >/= 40 ml/min? Yes.    Patient's GFR today is >60 2. Is urine output >/= 0.5 ml/kg/hr for the last 6 hours? Yes.   Patient's UOP is 0.5 ml/kg/hr 3. Is BUN < 60 mg/dL? Yes.    Patient's BUN today is 26 4. Abnormal electrolyte(s):k 2.9 5. Ordered repletion with: protocol 6. If a panic level lab has been reported, has the CCM MD in charge been notified? No..   Physician:    Ronda Fairly A 01/29/2018 7:00 AM

## 2018-01-30 ENCOUNTER — Inpatient Hospital Stay (HOSPITAL_COMMUNITY): Payer: Medicare Other

## 2018-01-30 LAB — BLOOD GAS, ARTERIAL
Acid-base deficit: 3.6 mmol/L — ABNORMAL HIGH (ref 0.0–2.0)
BICARBONATE: 22.1 mmol/L (ref 20.0–28.0)
Drawn by: 44135
FIO2: 40
LHR: 15 {breaths}/min
MECHVT: 500 mL
O2 SAT: 97.4 %
PEEP/CPAP: 5 cmH2O
PH ART: 7.284 — AB (ref 7.350–7.450)
Patient temperature: 97.8
pCO2 arterial: 47.8 mmHg (ref 32.0–48.0)
pO2, Arterial: 103 mmHg (ref 83.0–108.0)

## 2018-01-30 LAB — POCT I-STAT 3, ART BLOOD GAS (G3+)
ACID-BASE DEFICIT: 6 mmol/L — AB (ref 0.0–2.0)
Acid-base deficit: 6 mmol/L — ABNORMAL HIGH (ref 0.0–2.0)
BICARBONATE: 20.5 mmol/L (ref 20.0–28.0)
BICARBONATE: 19.5 mmol/L — AB (ref 20.0–28.0)
O2 Saturation: 99 %
O2 Saturation: 97 %
PCO2 ART: 37.7 mmHg (ref 32.0–48.0)
PO2 ART: 130 mmHg — AB (ref 83.0–108.0)
Patient temperature: 98.6
TCO2: 22 mmol/L (ref 22–32)
TCO2: 21 mmol/L — AB (ref 22–32)
pCO2 arterial: 42.8 mmHg (ref 32.0–48.0)
pH, Arterial: 7.288 — ABNORMAL LOW (ref 7.350–7.450)
pH, Arterial: 7.323 — ABNORMAL LOW (ref 7.350–7.450)
pO2, Arterial: 93 mmHg (ref 83.0–108.0)

## 2018-01-30 LAB — CBC WITH DIFFERENTIAL/PLATELET
Abs Immature Granulocytes: 0 10*3/uL (ref 0.0–0.1)
Basophils Absolute: 0 10*3/uL (ref 0.0–0.1)
Basophils Relative: 1 %
EOS ABS: 0.4 10*3/uL (ref 0.0–0.7)
EOS PCT: 5 %
HEMATOCRIT: 32.5 % — AB (ref 39.0–52.0)
HEMOGLOBIN: 9.7 g/dL — AB (ref 13.0–17.0)
Immature Granulocytes: 1 %
LYMPHS ABS: 0.7 10*3/uL (ref 0.7–4.0)
LYMPHS PCT: 10 %
MCH: 24.5 pg — ABNORMAL LOW (ref 26.0–34.0)
MCHC: 29.8 g/dL — AB (ref 30.0–36.0)
MCV: 82.1 fL (ref 78.0–100.0)
MONOS PCT: 10 %
Monocytes Absolute: 0.7 10*3/uL (ref 0.1–1.0)
Neutro Abs: 5.5 10*3/uL (ref 1.7–7.7)
Neutrophils Relative %: 73 %
Platelets: 233 10*3/uL (ref 150–400)
RBC: 3.96 MIL/uL — AB (ref 4.22–5.81)
RDW: 15.6 % — AB (ref 11.5–15.5)
WBC: 7.4 10*3/uL (ref 4.0–10.5)

## 2018-01-30 LAB — BASIC METABOLIC PANEL
Anion gap: 8 (ref 5–15)
BUN: 16 mg/dL (ref 6–20)
CHLORIDE: 108 mmol/L (ref 101–111)
CO2: 23 mmol/L (ref 22–32)
Calcium: 8.3 mg/dL — ABNORMAL LOW (ref 8.9–10.3)
Creatinine, Ser: 1.07 mg/dL (ref 0.61–1.24)
Glucose, Bld: 85 mg/dL (ref 65–99)
Potassium: 3.8 mmol/L (ref 3.5–5.1)
SODIUM: 139 mmol/L (ref 135–145)

## 2018-01-30 LAB — PHENYTOIN LEVEL, TOTAL: PHENYTOIN LVL: 10.5 ug/mL (ref 10.0–20.0)

## 2018-01-30 MED ORDER — IRBESARTAN 150 MG PO TABS
150.0000 mg | ORAL_TABLET | Freq: Every day | ORAL | Status: DC
  Administered 2018-01-30 – 2018-02-01 (×3): 150 mg via ORAL
  Filled 2018-01-30 (×3): qty 1

## 2018-01-30 MED ORDER — SODIUM CHLORIDE 0.9 % IV SOLN
150.0000 mg | Freq: Three times a day (TID) | INTRAVENOUS | Status: DC
  Administered 2018-01-30 – 2018-02-09 (×31): 150 mg via INTRAVENOUS
  Filled 2018-01-30 (×53): qty 3

## 2018-01-30 MED ORDER — SODIUM CHLORIDE 0.9 % IV SOLN
150.0000 mg | Freq: Two times a day (BID) | INTRAVENOUS | Status: DC
  Administered 2018-01-30: 150 mg via INTRAVENOUS
  Filled 2018-01-30 (×2): qty 15

## 2018-01-30 MED ORDER — SODIUM CHLORIDE 0.9 % IV SOLN
200.0000 mg | Freq: Two times a day (BID) | INTRAVENOUS | Status: DC
  Administered 2018-01-30 – 2018-02-09 (×20): 200 mg via INTRAVENOUS
  Filled 2018-01-30 (×27): qty 20

## 2018-01-30 MED ORDER — VITAL HIGH PROTEIN PO LIQD
1000.0000 mL | ORAL | Status: DC
  Administered 2018-01-30: 1000 mL

## 2018-01-30 MED ORDER — BISACODYL 10 MG RE SUPP
10.0000 mg | Freq: Every day | RECTAL | Status: DC | PRN
  Administered 2018-01-30 – 2018-02-07 (×2): 10 mg via RECTAL
  Filled 2018-01-30 (×3): qty 1

## 2018-01-30 MED ORDER — SODIUM CHLORIDE 0.9 % IV SOLN
0.0000 ug/min | INTRAVENOUS | Status: DC
  Administered 2018-01-30: 45 ug/min via INTRAVENOUS
  Administered 2018-01-31: 20 ug/min via INTRAVENOUS
  Filled 2018-01-30: qty 4
  Filled 2018-01-30: qty 40
  Filled 2018-01-30 (×3): qty 4

## 2018-01-30 MED ORDER — TOPIRAMATE 100 MG PO TABS
200.0000 mg | ORAL_TABLET | Freq: Two times a day (BID) | ORAL | Status: DC
  Administered 2018-01-30 – 2018-02-05 (×14): 200 mg via ORAL
  Filled 2018-01-30 (×15): qty 2

## 2018-01-30 NOTE — Progress Notes (Signed)
Subjective: Burst-suppression overnight  Exam: Vitals:   01/30/18 0759 01/30/18 0800  BP: (!) 124/59   Pulse: (!) 54   Resp: 18   Temp:  97.8 F (36.6 C)  SpO2: 100%    Gen: In bed, intubated Resp: ventilated Abd: soft, nt  Neuro: Limited by induced coma PERRL  Pertinent Labs: EKG- 5/24 normal PR interval.  EEG -BS, but with frequent shaprs in the bursts.   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 He was intubated yesterday after benzodiazepines at low dose were very sedating and his needing increased sedating medications for control of NCSE.   Given that he was being intubated, burst-suppression was pursued.   Dilantin is low theraputic, would prefer higher level as we wean.   Recommendations: 1) Lighten sedation today.  2) increase keppra to 1.5g bid 3) change dilantin to 150mg  TID.  4) vimpat  150mg  BID   This patient is critically ill and at significant risk of neurological worsening, death and care requires constant monitoring of vital signs, hemodynamics,respiratory and cardiac monitoring, neurological assessment, discussion with family, other specialists and medical decision making of high complexity. I spent 40 minutes of neurocritical care time  in the care of  this patient.  Roland Rack, MD [facility] [phone]  If 7pm- 7am, please page neurology on call as listed in [place]. 01/30/2018  8:49 AM

## 2018-01-30 NOTE — Progress Notes (Signed)
Nutrition Follow-up  DOCUMENTATION CODES:   Not applicable  INTERVENTION:   Recommend Vital High Protein @ 50 ml/hr (1200 ml/day)  Provides: 1200 kcals (1305 kcal with propofol), 105 grams protein, 1003 ml free water.   NUTRITION DIAGNOSIS:   Inadequate oral intake related to inability to eat as evidenced by NPO status.  GOAL:   Patient will meet greater than or equal to 90% of their needs  MONITOR:   Diet advancement, Weight trends, Labs, TF tolerance, Vent status, I & O's  ASSESSMENT:   Patient with PMH significant for right frontal CVA, COPD, CAD s/p CABG and AVR. Recently hospitalized in April for seizure and respiratory failure. Presents this admission with seizure in the setting of severe acidosis and acute respiratory failure requiring intubation.    5/24- extubated 5/27- re-intubated to allow deep sedation to burst suppression  Pt discussed during ICU rounds and with RN.  Per RN neuro decreasing propofol  Spoke with Dr Pearline Cables. Ok to feed after pt has a BM due to abd xray on 5/27 reporting minimal jejunal ileus  Patient is currently intubated on ventilator support MV: 7.8 L/min Temp (24hrs), Avg:98.1 F (36.7 C), Min:97.8 F (36.6 C), Max:98.4 F (36.9 C)  Propofol: 4 ml/hr - provided 105 kcal  Medications reviewed  Labs reviewed   Diet Order:   Diet Order           Diet NPO time specified  Diet effective now          EDUCATION NEEDS:   Not appropriate for education at this time  Skin:  Skin Assessment: Reviewed RN Assessment  Last BM:  5/25  Height:   Ht Readings from Last 1 Encounters:  01/28/18 5\' 5"  (1.651 m)    Weight:   Wt Readings from Last 1 Encounters:  01/28/18 136 lb 7.4 oz (61.9 kg)    Ideal Body Weight:  61.8 kg  BMI:  Body mass index is 22.71 kg/m.  Estimated Nutritional Needs:   Kcal:  1362 kcal  Protein:  75- 90 g  Fluid:  > 1.3 L/day  Maylon Peppers RD, LDN, CNSC [phone] Pager [phone] After Hours  Pager

## 2018-01-30 NOTE — Progress Notes (Signed)
VO received from [place] to return versed to 20 mg/hr and restart propofol at 80.

## 2018-01-30 NOTE — Procedures (Signed)
LTM-EEG Report  HISTORY: Continuous video-EEG monitoring in EMU performed for1year old with encephalopathy, R F stroke, abnormal EEG. ACQUISITION: International 10-20 system for electrode placement; 18 channels with additional eyes linked to ipsilateral ears and EKG. Additional T1-T2 electrodes were used. Continuous video recording obtained.   EEG NUMBER: MEDICATIONS:  Day 1: LCM, LEV, PHT, propofol Day 2: LCM, LEV, PHT, propofol  DAY #1: from18235/27/19 to 0730 01/29/18  BACKGROUND: The record consisted primarily of a burst-suppression pattern from 1900 onwards. Periods of low voltage attenuation lasted 2-8 seconds. Intervening bursts of medium voltage, irregular theta-delta activity lasted 1-3 seconds. There were superimposed right central lateralized periodic discharges (LPDs) seen in the bursts without ictal evolution. During periods of decreased attenuation, the LPDs occurred in runs of 1-2Hz . SEIZURES: none  EVENTS: none reported   DAY #2: from07305/28/19 to 0730 01/30/18  BACKGROUND: The record consisted of a burst-suppression pattern throughout the entirety of the recording. There was diffuse low voltage attenuation lasting 8-10 seconds. Intervening bursts of medium voltage, irregular theta-delta activity lasted 1-2 seconds. Occasional blunted right central LPDs were intermixed with the bursts with frequency 1Hz . The LPDs showed no ictal evolution and were less apparent than the previous day. SEIZURES: none  EVENTS: none reported   EKG: no significant arrhythmia  SUMMARY: This was an abnormal continuous video EEG due to a burst suppression pattern with occasional right central LPDs. The periodic activity did not show ictal evolution and was improved in abundance compared to the previous day. This pattern was consistent with a diffuse cerebral disturbance and a superimposed focal epileptogenic disturbance on the right. The LPDs could be representative of an injury pattern at this  point given their persistence despite AED treatment (typically not representative of an ongoing ictal pattern in this case).

## 2018-01-30 NOTE — Progress Notes (Signed)
Patient still on continuous EEG. Per RN will likely not be able to come off EEG today. RN will keep [place] posted. MRI on hold for now.

## 2018-01-30 NOTE — Progress Notes (Signed)
Pt had a small formed stool. Will start tube feedings per Nutrition/ Dr Pearline Cables orders.(Vital HP at 50 mL/hr.)

## 2018-01-30 NOTE — Progress Notes (Signed)
[facility] Progress Note Patient Name: Danny Krause DOB: 1940-11-18 MRN: [id number]   Date of Service  01/30/2018  HPI/Events of Note  ABG on 40%/PRVC 15/TV 500/P 5 = 7.28/47.8/103/22.  eICU Interventions  Will order: 1. Increase PRVC rate to 18. 2. Repeat ABG in 1 hour.      Intervention Category Major Interventions: Acid-Base disturbance - evaluation and management;Respiratory failure - evaluation and management  Langdon Crosson Cornelia Copa 01/30/2018, 6:49 AM

## 2018-01-30 NOTE — Progress Notes (Signed)
Weaning off burst suppression meds. May use Propofol as needed for sedation per Dr Leonel Ramsay and Dr Pearline Cables.

## 2018-01-30 NOTE — Progress Notes (Signed)
LTM maint completed

## 2018-01-30 NOTE — Progress Notes (Signed)
PULMONARY / CRITICAL CARE MEDICINE   Name: Danny Krause MRN: [id number] DOB: [birth year]    ADMISSION DATE:  01/24/2018 CONSULTATION DATE:  01/24/2018  REFERRING MD:  Maudie Mercury  CHIEF COMPLAINT:  Seizures  HISTORY OF PRESENT ILLNESS:   77 year old man with prior history of seizures on background of prior CVA on home oxygen mainly at night.  First diagnosed with seizures in 12/2017 and discharged home on Keppra. Questionable compliance at home.  Found unresponsive at home with seizure like activity.  Intubated in ED for hypercapneic respiratory failure.  On the floor 5/27 he developed new left-sided weakness and a CT angiogram was performed which showed no evidence of large vessel occlusion.  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  In light of his extremely poor baseline respiratory function and prior CO2 retention during this admission only 1 mg of Ativan was administered on the floor after which there was still seizure activity on the EEG but the patient was profoundly somnolent.  It was elected  elected to transfer him to ICU for intubation to allow deep sedation to burst suppression.      In the past 24 hours his sedation has been increased to obtain burst suppression.  He is currently on 80 mcg of propofol and 20 mg of Versed.  He is requiring a moderate dose of Neo-Synephrine for blood pressure support.  There is been no clinical seizure activity.    PAST MEDICAL HISTORY :  He  has a past medical history of Anginal pain ([place]), Coronary artery disease, Critical aortic valve stenosis, Dyspnea, Murmur, Seizure ([place]), and Syncope.  PAST SURGICAL HISTORY: He  has a past surgical history that includes No past surgeries; RIGHT/LEFT HEART CATH AND CORONARY ANGIOGRAPHY (N/A, 06/25/2017); THORACIC AORTOGRAM (06/25/2017); Aortic valve replacement (N/A, 07/04/2017); Coronary artery bypass graft (N/A, 07/04/2017); and TEE without cardioversion (N/A,  07/04/2017).  No Known Allergies  No current facility-administered medications on file prior to encounter.    Current Outpatient Medications on File Prior to Encounter  Medication Sig  . acetaminophen (TYLENOL) 650 MG CR tablet Take 650 mg by mouth every 8 (eight) hours as needed for pain.  . budesonide-formoterol (SYMBICORT) 160-4.5 MCG/ACT inhaler Inhale 2 puffs into the lungs 2 (two) times daily. (Patient taking differently: Inhale 2 puffs into the lungs daily. )  . levETIRAcetam (KEPPRA) 500 MG tablet Take 2 tablets (1,000 mg total) by mouth 2 (two) times daily.  . metoprolol tartrate (LOPRESSOR) 25 MG tablet TAKE 1/2 TABLET BY MOUTH TWICE A DAY  . olmesartan (BENICAR) 40 MG tablet Take 1 tablet (40 mg total) by mouth daily.  Marland Kitchen albuterol (PROAIR HFA) 108 (90 Base) MCG/ACT inhaler Inhale 2 puffs into the lungs every 6 (six) hours as needed for wheezing or shortness of breath.    FAMILY HISTORY:  His indicated that his mother is deceased. He indicated that his father is deceased. He indicated that the status of his brother is unknown.   SOCIAL HISTORY: He  reports that he quit smoking about 6 months ago. His smoking use included cigarettes. He has a 128.00 pack-year smoking history. He has never used smokeless tobacco. He reports that he does not drink alcohol or use drugs.  REVIEW OF SYSTEMS:   Negative apart from than which is mentioned above.  SUBJECTIVE:   VITAL SIGNS: BP (!) 125/58   Pulse (!) 51   Temp 97.8 F (36.6 C) (Oral)   Resp 15  Ht 5\' 5"  (1.651 m)   Wt 136 lb 7.4 oz (61.9 kg)   SpO2 100%   BMI 22.71 kg/m   HEMODYNAMICS:  On no vasopressors.  VENTILATOR SETTINGS: Vent Mode: PRVC FiO2 (%):  [40 %] 40 % Set Rate:  [15 bmp] 15 bmp Vt Set:  [500 mL] 500 mL PEEP:  [5 cmH20] 5 cmH20 Plateau Pressure:  [3 cmH20-13 cmH20] 12 cmH20  INTAKE / OUTPUT: I/O last 3 completed shifts: In: 6734 [I.V.:4269.4; IV Piggyback:1459.6] Out: 2505 [Urine:2105; Emesis/NG  output:400]  PHYSICAL EXAMINATION: General: Elderly male who is intubated and mechanically ventilated and deeply sedated sedation   Neuro: No response to voice, pupils are equal and reactive at 2 mm the face is symmetric   HEENT:  No icterus. Cardiovascular: S1 and S2 are distant without murmur rub or gallop    Lungs: Respirations are unlabored, there is symmetric air movement, no wheezes.  I specifically do not hear rhonchi even at the right base   Abdomen: The abdomen is soft and nontender, there are active bowel sounds . Musculoskeletal: No significant edema  Skin:  Intact.  LABS:  BMET Recent Labs  Lab 01/29/18 0238 01/29/18 2029 01/30/18 0322  NA 134* 139 139  K 2.9* 4.4 3.8  CL 99* 109 108  CO2 26 24 23   BUN 26* 20 16  CREATININE 1.19 1.18 1.07  GLUCOSE 94 89 85    Electrolytes Recent Labs  Lab 01/29/18 0238 01/29/18 2029 01/30/18 0322  CALCIUM 8.4* 8.4* 8.3*  MG 2.4  --   --     CBC Recent Labs  Lab 01/28/18 0356 01/29/18 0238 01/30/18 0322  WBC 11.2* 9.3 7.4  HGB 11.0* 10.2* 9.7*  HCT 35.1* 32.8* 32.5*  PLT 285 291 233    Coag's No results for input(s): APTT, INR in the last 168 hours.  Sepsis Markers Recent Labs  Lab 01/25/18 0449 01/26/18 0342  PROCALCITON 0.17 0.25    ABG Recent Labs  Lab 01/25/18 0623 01/29/18 0439 01/30/18 0325  PHART 7.399 7.371 7.284*  PCO2ART 40.7 43.8 47.8  PO2ART 79.0* 143* 103    Liver Enzymes Recent Labs  Lab 01/24/18 2313 01/29/18 0238  AST 33 17  ALT 21 14*  ALKPHOS 65 53  BILITOT 0.2* 0.7  ALBUMIN 4.4 3.1*    Cardiac Enzymes No results for input(s): TROPONINI, PROBNP in the last 168 hours.  Glucose Recent Labs  Lab 01/25/18 0432 01/25/18 0735 01/25/18 1140 01/25/18 1539 01/25/18 2008 01/28/18 1450  GLUCAP 200* 127* 102* 137* 128* 104*    Imaging No results found.  Chest x-ray to my eye this morning suggest a right lower lobe infiltrate   STUDIES:  EEG showed:  This  sedated EEG is abnormal due to the presence of: 1. Moderate diffuse background slowing 2. Lateralized periodic discharges (LPDs) over the right frontotemporal region - consistent with possible epileptogenic focus. 3. Brief 5-second period of rhythmic theta activity over the right temporal region without evolution  CULTURES: negative  ANTIBIOTICS: No antibiotics  SIGNIFICANT EVENTS: Extubated 5/24.  Seen by Neurology who recommends continuing dual anticonvulsant therapy until non-compliance can be confirmed. Reintubated 5/27 in order to allow deep sedation to burst suppression  LINES/TUBES: PIV's only.  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  He also has a history of O2 dependent chronic lung disease.  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  I am concurrently treating him for aspiration with empiric Zosyn.  His respiratory acidosis was corrected with ventilator adjustments this morning.  Inhaled bronchodilators continue.  There is no prospect for separation from mechanical ventilation with her current sedation regimen. Resume home COPD regimen. Progressive ambulation.  CARDIOVASCULAR A:  Hypertension s/p AVR P:  He is chronically on Avapro for blood pressure control.  I cut the dose in half today as he is currently requiring Neo-Synephrine to support his blood pressure while sedated.       RENAL A:   Volume Status: Clinically euvolemic, even fluid balance. P:   Allow autoregulation. Foley: removed.  GASTROINTESTINAL A:   Nutritional Status:  Moderate nutritional risk. P:    HEMATOLOGIC A:   DVT prophylaxis: UFH; Transfusion requirements:none   INFECTIOUS A:   Treating  empirically for aspiration with Zosyn as noted   ENDOCRINE A:   Euglycemic   Glycemic control: No coverage required; Stress steroids: none P:   D/C CBG.  NEUROLOGIC A:   Seizure disorder, prior stroke.    He is sedated to burst suppression with propofol and Versed.    He continues Vimpat Keppra and Dilantin.  Dilantin level this morning is 10, I suspect he is somewhat hypoalbuminemic so this likely represents an appropriate level.    Greater than 32 minutes was spent in the care of this acutely ill patient today  Lars Masson, MD [place] Pager: [phone] After hours [phone]  01/30/2018, 7:28 AM

## 2018-01-30 NOTE — Progress Notes (Signed)
cEEG reviewed. Continues to demonstrate burst suppression pattern. Formal read in the AM. Continue with current doses of sedation. Neurology will follow with you  -- Amie Portland, MD [facility] Pager: [phone] If 7pm to 7am, please call on call as listed on AMION.

## 2018-01-30 NOTE — Progress Notes (Signed)
PT Cancellation Note  Patient Details Name: CALEEL KINER MRN: [id number] DOB: 01/22/1941   Cancelled Treatment:    Reason Eval/Treat Not Completed: Patient not medically ready. Pt re-intubated and sedated. Pt not appropriate for PT at this time. PT to return as able, as appropriate.  Kittie Plater, PT, DPT Pager #: [phone] Office #: [phone]    [place] 01/30/2018, 8:08 AM

## 2018-01-31 DIAGNOSIS — J449 Chronic obstructive pulmonary disease, unspecified: Secondary | ICD-10-CM

## 2018-01-31 LAB — BASIC METABOLIC PANEL
Anion gap: 8 (ref 5–15)
BUN: 12 mg/dL (ref 6–20)
CHLORIDE: 112 mmol/L — AB (ref 101–111)
CO2: 21 mmol/L — ABNORMAL LOW (ref 22–32)
Calcium: 8.3 mg/dL — ABNORMAL LOW (ref 8.9–10.3)
Creatinine, Ser: 0.91 mg/dL (ref 0.61–1.24)
GFR calc non Af Amer: >60 mL/min (ref >60)
Glucose, Bld: 83 mg/dL (ref 65–99)
Potassium: 3.2 mmol/L — ABNORMAL LOW (ref 3.5–5.1)
SODIUM: 141 mmol/L (ref 135–145)

## 2018-01-31 LAB — PHENYTOIN LEVEL, TOTAL: PHENYTOIN LVL: 11.3 ug/mL (ref 10.0–20.0)

## 2018-01-31 LAB — PROCALCITONIN: PROCALCITONIN: 0.11 ng/mL

## 2018-01-31 LAB — BLOOD GAS, ARTERIAL
ACID-BASE DEFICIT: 5.7 mmol/L — AB (ref 0.0–2.0)
Bicarbonate: 19.3 mmol/L — ABNORMAL LOW (ref 20.0–28.0)
DRAWN BY: 441351
FIO2: 30
MECHVT: 500 mL
O2 SAT: 98.4 %
PCO2 ART: 38.5 mmHg (ref 32.0–48.0)
PEEP: 5 cmH2O
PO2 ART: 119 mmHg — AB (ref 83.0–108.0)
Patient temperature: 98.6
RATE: 21 resp/min
pH, Arterial: 7.321 — ABNORMAL LOW (ref 7.350–7.450)

## 2018-01-31 LAB — MAGNESIUM: MAGNESIUM: 2.1 mg/dL (ref 1.7–2.4)

## 2018-01-31 LAB — TRIGLYCERIDES: TRIGLYCERIDES: 317 mg/dL — AB (ref <150)

## 2018-01-31 MED ORDER — PRO-STAT SUGAR FREE PO LIQD
60.0000 mL | Freq: Two times a day (BID) | ORAL | Status: DC
  Administered 2018-01-31 – 2018-02-01 (×3): 60 mL
  Filled 2018-01-31 (×3): qty 60

## 2018-01-31 MED ORDER — VITAL HIGH PROTEIN PO LIQD
1000.0000 mL | ORAL | Status: DC
Start: 2018-01-31 — End: 2018-02-01
  Administered 2018-01-31: 1000 mL

## 2018-01-31 MED ORDER — ADULT MULTIVITAMIN LIQUID CH
15.0000 mL | Freq: Every day | ORAL | Status: DC
  Administered 2018-01-31 – 2018-02-10 (×10): 15 mL
  Filled 2018-01-31 (×11): qty 15

## 2018-01-31 MED ORDER — POTASSIUM CHLORIDE 20 MEQ/15ML (10%) PO SOLN
40.0000 meq | Freq: Once | ORAL | Status: AC
  Administered 2018-01-31: 40 meq
  Filled 2018-01-31: qty 30

## 2018-01-31 MED ORDER — TOPIRAMATE 100 MG PO TABS
200.0000 mg | ORAL_TABLET | Freq: Once | ORAL | Status: AC
  Administered 2018-01-31: 200 mg via ORAL
  Filled 2018-01-31: qty 2

## 2018-01-31 NOTE — Progress Notes (Addendum)
PULMONARY / CRITICAL CARE MEDICINE   Name: Danny Krause MRN: [id number] DOB: [birth year]    ADMISSION DATE:  01/24/2018 CONSULTATION DATE:  01/24/2018  REFERRING MD:  Maudie Mercury  CHIEF COMPLAINT:  Seizures  BRIEF SUMMARY:   77 y/o M who presented 5/23 after being found unresponsive at home with concern for seizure like activity.  He was intubated in the ER for hypercapnic respiratory failure.  He has a history of CVA with encephalomalacia and seizures (first dx 12/2017), on Keppra with questionable compliance.  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   On the floor 5/27, he developed new left-sided weakness and a CT angiogram was performed which showed no evidence of large vessel occlusion.  Neurology consulted.  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  In light of his extremely poor baseline respiratory function and prior CO2 retention during this admission only 1 mg of Ativan was administered on the floor after which there was still seizure activity on the EEG but the patient was profoundly somnolent.  It was elected elected to transfer him to ICU for intubation to allow deep sedation to burst suppression.  He required as much as 80 mcg of propofol, 20 mg of Versed for sedation and neo-synephrine for BP support.     SUBJECTIVE:  Pt re-sedated overnight due to concern for seizure.  Remains on 80 mcg propofol, 20 mg Versed, 20 mcg neosynephrine.  I/O - 4.3L positive.  Afebrile.    VITAL SIGNS: BP (!) 127/59   Pulse (!) 45   Temp (!) 97.4 F (36.3 C) (Oral)   Resp (!) 21   Ht 5\' 5"  (1.651 m)   Wt 147 lb 11.3 oz (67 kg)   SpO2 98%   BMI 24.58 kg/m   HEMODYNAMICS:  On no vasopressors.  VENTILATOR SETTINGS: Vent Mode: PRVC FiO2 (%):  [30 %-40 %] 30 % Set Rate:  [21 bmp] 21 bmp Vt Set:  [500 mL] 500 mL PEEP:  [5 cmH20] 5 cmH20 Plateau Pressure:  [13 cmH20-14 cmH20] 14 cmH20  INTAKE / OUTPUT: I/O last 3 completed shifts: In:  3979.8 [I.V.:2576.8; NG/GT:260; IV Piggyback:1143] Out: 2409 [BDZHG:9924; Emesis/NG output:400]  PHYSICAL EXAMINATION: General: chronically ill appearing male in NAD on vent HEENT: MM pink/moist, ETT, EEG electrodes in place  Neuro: sedate, pupils 2-11mm CV: s1s2 rrr, no m/r/g, L [place] TLC PULM: even/non-labored, lungs bilaterally coarse, diminished lower lateral lobes  QA:STMH, non-tender, bsx4 active  Extremities: warm/dry, trace generalized edema  Skin: no rashes or lesions  LABS:  BMET Recent Labs  Lab 01/29/18 2029 01/30/18 0322 01/31/18 0255  NA 139 139 141  K 4.4 3.8 3.2*  CL 109 108 112*  CO2 24 23 21*  BUN 20 16 12   CREATININE 1.18 1.07 0.91  GLUCOSE 89 85 83    Electrolytes Recent Labs  Lab 01/29/18 0238 01/29/18 2029 01/30/18 0322 01/31/18 0255  CALCIUM 8.4* 8.4* 8.3* 8.3*  MG 2.4  --   --  2.1    CBC Recent Labs  Lab 01/28/18 0356 01/29/18 0238 01/30/18 0322  WBC 11.2* 9.3 7.4  HGB 11.0* 10.2* 9.7*  HCT 35.1* 32.8* 32.5*  PLT 285 291 233    Coag's No results for input(s): APTT, INR in the last 168 hours.  Sepsis Markers Recent Labs  Lab 01/25/18 0449 01/26/18 0342  PROCALCITON 0.17 0.25    ABG Recent Labs  Lab 01/30/18 0857 01/30/18 1430 01/31/18 0335  PHART 7.288*  7.323* 7.321*  PCO2ART 42.8 37.7 38.5  PO2ART 130.0* 93.0 119*    Liver Enzymes Recent Labs  Lab 01/24/18 2313 01/29/18 0238  AST 33 17  ALT 21 14*  ALKPHOS 65 53  BILITOT 0.2* 0.7  ALBUMIN 4.4 3.1*    Cardiac Enzymes No results for input(s): TROPONINI, PROBNP in the last 168 hours.  Glucose Recent Labs  Lab 01/25/18 0432 01/25/18 0735 01/25/18 1140 01/25/18 1539 01/25/18 2008 01/28/18 1450  GLUCAP 200* 127* 102* 137* 128* 104*    Imaging No results found.   STUDIES:  EEG showed:  1. Moderate diffuse background slowing 2. Lateralized periodic discharges (LPDs) over the right frontotemporal region - consistent with possible epileptogenic  focus. 3. Brief 5-second period of rhythmic theta activity over the right temporal region without evolution CTA Head / Neck 5/27 >> no intracranial or extracranial flow reducing stenosis or emergent large vessel occlusion  CT Head Perfusion 5/27 >> relative increased cerebral blood flow & cerebral blood volume in the right hemisphere as compared to the left, correlates with suspected status / epileptiform activity EEG 5/29 >> abnormal continuous EEG due to burst suppression with occasional right central LPD's. No ictal evolution.  Pattern consistent with a diffuse cerebral disturbance and a superimposed focal epileptogenic disturbance on the right.  LPD's could be representative of an injury pattern given their persistence despite AED treatment.   CULTURES:   ANTIBIOTICS: Zosyn 5/27 >>  SIGNIFICANT EVENTS: 5/23  Admit, intubated with concern for seizure 5/24  Extubated  5/24  Neuro rec's continuing dual anticonvulsant therapy until non-compliance can be confirmed. 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  He also has a history of O2 dependent chronic lung disease.  ASSESSMENT / PLAN:  NEUROLOGIC A:   Seizure Disorder Hx CVA P:   Continue ASA Continue lacosamide, levetiracetam, dilantin Continue Topiramate  Monitor for seizure activity  Appreciate Neurology input  Will need outpatient Neurology follow up  Trend dilantin level  PULMONARY A: O2 Dependent COPD  Rule Out Aspiration  Respiratory Acidosis - corrected  P: Continue Dulera PRN duoneb  Follow intermittent CXR   CARDIOVASCULAR A:  Hypertension  Hx AVR, CAD P:  Continue home avapro at 1/2  home dose given sedation  Neosynephrine as needed to achieve sedation needs for seizure  Tele monitoring      RENAL A:   Hypokalemia  P:   Trend BMP / urinary output Replace electrolytes as indicated Avoid nephrotoxic agents, ensure adequate renal perfusion  GASTROINTESTINAL A:   Moderate Nutritional Risk  P:   NPO / OGT Pepcid for SUP  TF per Nutrition   HEMATOLOGIC A:   Anemia - without bleeding, suspect of chronic disease P: Trend CBC  Heparin for DVT prophylaxis   INFECTIOUS A:   Rule Out Aspiration  P: Continue empiric zosyn for now, D4/x Assess PCT, if negative d/c abx 5/31  ENDOCRINE A:   Euglycemic   P:   Monitor glucose on BMP     Noe Gens, NP-C [facility] Pgr: [phone] or if no answer [phone] 01/31/2018, 8:33 AM

## 2018-01-31 NOTE — Progress Notes (Signed)
Nutrition Follow-up  DOCUMENTATION CODES:   Not applicable  INTERVENTION:   Decrease Vital High Protein to 15 ml/hr 60 ml Prostat BID MVI daily  Provides: 760 kcal, 91 grams protein, and 300 ml free water TF regimen and propofol at current rate providing 1599 total kcal/day   NUTRITION DIAGNOSIS:   Inadequate oral intake related to inability to eat as evidenced by NPO status. Ongoing.   GOAL:   Patient will meet greater than or equal to 90% of their needs Met.   MONITOR:   Diet advancement, Weight trends, Labs, TF tolerance, Vent status, I & O's  ASSESSMENT:   Patient with PMH significant for right frontal CVA, COPD, CAD s/p CABG and AVR. Recently hospitalized in April for seizure and respiratory failure. Presents this admission with seizure in the setting of severe acidosis and acute respiratory failure requiring intubation.   Pt discussed during ICU rounds and with RN.  Per RN pt with breakthrough seizures after lightening of sedation, pt now back on high rate of propofol 5/29 TF started after bm  Patient is currently intubated on ventilator support MV: 10.1 L/min Temp (24hrs), Avg:97.2 F (36.2 C), Min:94.9 F (34.9 C), Max:98.1 F (36.7 C)  Propofol: 31.3 ml/hr provides: 839 kcal  Medications reviewed  Labs reviewed: K+ 3.2 (L), TG: 317 (H)     Diet Order:   Diet Order           Diet NPO time specified  Diet effective now          EDUCATION NEEDS:   Not appropriate for education at this time  Skin:  Skin Assessment: Reviewed RN Assessment  Last BM:  5/29  Height:   Ht Readings from Last 1 Encounters:  01/28/18 '5\' 5"'$  (1.651 m)    Weight:   Wt Readings from Last 1 Encounters:  01/31/18 147 lb 11.3 oz (67 kg)    Ideal Body Weight:  61.8 kg  BMI:  Body mass index is 24.58 kg/m.  Estimated Nutritional Needs:   Kcal:  1504  Protein:  85-95 grams  Fluid:  >1.5 L/day  Maylon Peppers RD, LDN, CNSC [phone] Pager [phone] After  Hours Pager

## 2018-01-31 NOTE — Procedures (Signed)
LTM-EEG Report  HISTORY: Continuous video-EEG monitoring in EMU performed for43year old with encephalopathy, R F stroke, abnormal EEG. ACQUISITION: International 10-20 system for electrode placement; 18 channels with additional eyes linked to ipsilateral ears and EKG. Additional T1-T2 electrodes were used. Continuous video recording obtained.   EEG NUMBER: MEDICATIONS:  Day 1: LCM, LEV, PHT, propofol Day 2: LCM, LEV, PHT, propofol Day 3: see EMR  DAY #1: from18235/27/19 to 0730 01/29/18  BACKGROUND:The record consisted primarily of a burst-suppression pattern from 1900 onwards. Periods of low voltage attenuation lasted 2-8 seconds. Intervening bursts of medium voltage, irregular theta-delta activity lasted 1-3 seconds. There were superimposed right central lateralized periodic discharges (LPDs) seen in the bursts without ictal evolution. During periods of decreased attenuation, the LPDs occurred in runs of 1-2Hz . SEIZURES: none  EVENTS: none reported   DAY #2: from07305/28/19 to 0730 01/30/18  BACKGROUND:The record consisted of a burst-suppression pattern throughout the entirety of the recording. There was diffuse low voltage attenuation lasting 8-10 seconds. Intervening bursts of medium voltage, irregular theta-delta activity lasted 1-2 seconds. Occasional blunted right central LPDs were intermixed with the bursts with frequency 1Hz . The LPDs showed no ictal evolution and were less apparent than the previous day. SEIZURES: none  EVENTS: none reported   DAY #3: from07305/29/19 to 0730 01/31/18  BACKGROUND:The record consisted of a slightly discontinuous background with irregular 2-5Hz  activity bilaterally and occasional brief periods of voltage attenuation lasting 1-2 seconds. Periods of brief waking/arousals were seen in the early afternoon with improvement in LPD activity (described below). After 1900, the patient returned into burst-suppression with an interburst interval of 10  seconds and bursts of irregular 2-5Hz  activity lasting 1-2 seconds.  PERIODIC ACTIVITY: There were right central LPDs with frequency 1-1.5Hz , sharp morphology at times but with some spiky waveforms in the early afternoon and brief inconsistent superimposed fast activity. These were improved during arousals with a more blunted morphology, loss of overriding fast, and slower frequency. After increased sedation at 1900, these LPDs were infrequent. SEIZURES: none  EVENTS: none reported   EKG: no significant arrhythmia  SUMMARY: This was an abnormal continuous video EEG due toa burst suppression pattern with right central LPDs with some inconsistent overriding fast activity. The LPD activity improved with arousal in the afternoon with resolution of the fast activity. Periodic discharges ceased after 1900 with increasing sedation. The patient remained in burst-suppression from that point forward.

## 2018-01-31 NOTE — Progress Notes (Signed)
MD order to titrate propofol down by 10 mcg q hour until off and then titrate versed down by 5 mg q hour until 5 mg is reached and maintain at 5 mg.

## 2018-01-31 NOTE — Progress Notes (Signed)
Subjective: Lightened BS yesterday, had return of LPDs with some associated fast activity, started on topamax and   Exam: Vitals:   01/31/18 1130 01/31/18 1145  BP: (!) 107/56 115/65  Pulse: (!) 46 (!) 47  Resp: (!) 21 (!) 21  Temp:    SpO2: 99% 99%   Gen: In bed, intubated Resp: ventilated Abd: soft, nt  Neuro: Limited by induced coma PERRL  Pertinent Labs: EKG- 5/24 normal PR interval.   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 He had an increased in LPDs with stopping sedationa nd so he was started on topiramate and sedation was resumed. I think at this point, however, I would favor trying to wean again. I think that if LPDs come back, then I would not favor starting continuous drip again    Recommendations: 1) increase keppra to 1.5g bid 2) change dilantin to 150mg  TID.  3) vimpat 150mg  BID 4) continue topiramate 200mg  BID  This patient is critically ill and at significant risk of neurological worsening, death and care requires constant monitoring of vital signs, hemodynamics,respiratory and cardiac monitoring, neurological assessment, discussion with family, other specialists and medical decision making of high complexity. I spent 40 minutes of neurocritical care time  in the care of  this patient.  Roland Rack, MD [facility] [phone]  If 7pm- 7am, please page neurology on call as listed in [place]. 01/31/2018  12:16 PM

## 2018-01-31 NOTE — Progress Notes (Signed)
PT Cancellation Note  Patient Details Name: Danny Krause MRN: [id number] DOB: 1941/07/05   Cancelled Treatment:    Reason Eval/Treat Not Completed: Patient not medically ready.  Pt sedated, on vent, burst suppression continues. 01/31/2018  Donnella Sham, [place] [phone]  (pager)   Tessie Fass [name] 01/31/2018, 2:19 PM

## 2018-01-31 NOTE — Telephone Encounter (Signed)
Called [employer] to follow up. They reported that the faxed over paperwork was not faxed with our cover sheet and that the patient is missing 2 items: proof of income and we needed to clarify insurance drug coverage. Called and spoke to patient's son. Patient is currently in the hospital. Let him know that we can figure this out when his father is feeling better but did share the two items that will be needed to get the financial assistance.

## 2018-02-01 ENCOUNTER — Inpatient Hospital Stay (HOSPITAL_COMMUNITY): Payer: Medicare Other

## 2018-02-01 LAB — TRIGLYCERIDES: TRIGLYCERIDES: 249 mg/dL — AB (ref <150)

## 2018-02-01 LAB — BASIC METABOLIC PANEL
Anion gap: 6 (ref 5–15)
BUN: 20 mg/dL (ref 6–20)
CALCIUM: 8.1 mg/dL — AB (ref 8.9–10.3)
CHLORIDE: 115 mmol/L — AB (ref 101–111)
CO2: 21 mmol/L — ABNORMAL LOW (ref 22–32)
CREATININE: 0.98 mg/dL (ref 0.61–1.24)
GFR calc non Af Amer: >60 mL/min (ref >60)
Glucose, Bld: 142 mg/dL — ABNORMAL HIGH (ref 65–99)
Potassium: 3.4 mmol/L — ABNORMAL LOW (ref 3.5–5.1)
SODIUM: 142 mmol/L (ref 135–145)

## 2018-02-01 LAB — CBC
HCT: 31.4 % — ABNORMAL LOW (ref 39.0–52.0)
HEMOGLOBIN: 9.3 g/dL — AB (ref 13.0–17.0)
MCH: 24.6 pg — ABNORMAL LOW (ref 26.0–34.0)
MCHC: 29.6 g/dL — AB (ref 30.0–36.0)
MCV: 83.1 fL (ref 78.0–100.0)
Platelets: 245 10*3/uL (ref 150–400)
RBC: 3.78 MIL/uL — ABNORMAL LOW (ref 4.22–5.81)
RDW: 16.6 % — ABNORMAL HIGH (ref 11.5–15.5)
WBC: 7.3 10*3/uL (ref 4.0–10.5)

## 2018-02-01 LAB — PROCALCITONIN: PROCALCITONIN: 0.1 ng/mL

## 2018-02-01 LAB — PHENYTOIN LEVEL, TOTAL: PHENYTOIN LVL: 14.6 ug/mL (ref 10.0–20.0)

## 2018-02-01 MED ORDER — POTASSIUM CHLORIDE 20 MEQ/15ML (10%) PO SOLN
40.0000 meq | Freq: Once | ORAL | Status: AC
  Administered 2018-02-01: 40 meq
  Filled 2018-02-01: qty 30

## 2018-02-01 MED ORDER — VITAL HIGH PROTEIN PO LIQD
1000.0000 mL | ORAL | Status: DC
  Administered 2018-02-01 – 2018-02-08 (×6): 1000 mL

## 2018-02-01 MED ORDER — FENTANYL CITRATE (PF) 100 MCG/2ML IJ SOLN
50.0000 ug | INTRAMUSCULAR | Status: DC | PRN
  Administered 2018-02-01 – 2018-02-11 (×24): 50 ug via INTRAVENOUS
  Filled 2018-02-01 (×25): qty 2

## 2018-02-01 NOTE — Progress Notes (Signed)
PULMONARY / CRITICAL CARE MEDICINE   Name: Danny Krause MRN: [id number] DOB: 1941/06/22    ADMISSION DATE:  01/24/2018 CONSULTATION DATE:  01/24/2018  REFERRING MD:  Maudie Mercury  CHIEF COMPLAINT:  Seizures  BRIEF SUMMARY:   77 y/o M who presented 5/23 after being found unresponsive at home with concern for seizure like activity.  He was intubated in the ER for hypercapnic respiratory failure.  He has a history of CVA with encephalomalacia and seizures (first dx 12/2017), on Keppra with questionable compliance.  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   On the floor 5/27, he developed new left-sided weakness and a CT angiogram was performed which showed no evidence of large vessel occlusion.  Neurology consulted.  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  In light of his extremely poor baseline respiratory function and prior CO2 retention during this admission only 1 mg of Ativan was administered on the floor after which there was still seizure activity on the EEG but the patient was profoundly somnolent.  It was elected elected to transfer him to ICU for intubation to allow deep sedation to burst suppression.  He required as much as 80 mcg of propofol, 20 mg of Versed for sedation and neo-synephrine for BP support.     SUBJECTIVE: Heavy sedation has been discontinued.  He still has residual sedation on board and is poorly responsive.  VITAL SIGNS: BP 123/60   Pulse 62   Temp 97.7 F (36.5 C) (Axillary)   Resp (!) 24   Ht 5\' 5"  (1.651 m)   Wt 67 kg (147 lb 11.3 oz)   SpO2 98%   BMI 24.58 kg/m   HEMODYNAMICS:  On no vasopressors.  VENTILATOR SETTINGS: Vent Mode: PRVC FiO2 (%):  [30 %] 30 % Set Rate:  [21 bmp] 21 bmp Vt Set:  [500 mL] 500 mL PEEP:  [5 cmH20] 5 cmH20 Plateau Pressure:  [13 cmH20-16 cmH20] 13 cmH20  INTAKE / OUTPUT: I/O last 3 completed shifts: In: 4190.4 [I.V.:1838.3; NG/GT:749.1; IV Piggyback:1603] Out:  1360 [Urine:1360]  PHYSICAL EXAMINATION: General: Elderly male heavily sedated on full mechanical ventilatory support HEENT: Endotracheal tube and NG tube in place Neuro: Heavily sedated.  No physical response dose overbreathing vent however CV: Heart sounds are regular PULM: Decreased breath sounds in bases GI: Soft nontender decreased bowel sounds Extremities: warm/dry, negative edema  Skin: no rashes or lesions   LABS:  BMET Recent Labs  Lab 01/30/18 0322 01/31/18 0255 02/01/18 0400  NA 139 141 142  K 3.8 3.2* 3.4*  CL 108 112* 115*  CO2 23 21* 21*  BUN 16 12 20   CREATININE 1.07 0.91 0.98  GLUCOSE 85 83 142*    Electrolytes Recent Labs  Lab 01/29/18 0238  01/30/18 0322 01/31/18 0255 02/01/18 0400  CALCIUM 8.4*   < > 8.3* 8.3* 8.1*  MG 2.4  --   --  2.1  --    < > = values in this interval not displayed.    CBC Recent Labs  Lab 01/29/18 0238 01/30/18 0322 02/01/18 0400  WBC 9.3 7.4 7.3  HGB 10.2* 9.7* 9.3*  HCT 32.8* 32.5* 31.4*  PLT 291 233 245    Coag's No results for input(s): APTT, INR in the last 168 hours.  Sepsis Markers Recent Labs  Lab 01/26/18 0342 01/31/18 0846 02/01/18 0400  PROCALCITON 0.25 0.11 0.10    ABG Recent Labs  Lab 01/30/18 0857 01/30/18 1430 01/31/18  0335  PHART 7.288* 7.323* 7.321*  PCO2ART 42.8 37.7 38.5  PO2ART 130.0* 93.0 119*    Liver Enzymes Recent Labs  Lab 01/29/18 0238  AST 17  ALT 14*  ALKPHOS 53  BILITOT 0.7  ALBUMIN 3.1*    Cardiac Enzymes No results for input(s): TROPONINI, PROBNP in the last 168 hours.  Glucose Recent Labs  Lab 01/25/18 1140 01/25/18 1539 01/25/18 2008 01/28/18 1450  GLUCAP 102* 137* 128* 104*    Imaging Dg Chest Port 1 View  Result Date: 02/01/2018 CLINICAL DATA:  Seizure. EXAM: PORTABLE CHEST 1 VIEW COMPARISON:  One-view chest x-ray 01/30/2018 FINDINGS: The heart size is normal. Endotracheal tube is stable. Left subclavian line is stable. NG tube courses  off the inferior border the film. 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 Bibasilar airspace disease is similar to the prior exam. A left effusion is suspected. Upper lung fields are clear. Visualized soft tissues and bony thorax are unremarkable. IMPRESSION: 1. Stable bibasilar airspace opacities concerning for infection or aspiration. 2. Probable left pleural effusion. 3. Support apparatus is stable. Electronically Signed   By: San Morelle M.D.   On: 02/01/2018 07:56     STUDIES:  EEG showed:  1. Moderate diffuse background slowing 2. Lateralized periodic discharges (LPDs) over the right frontotemporal region - consistent with possible epileptogenic focus. 3. Brief 5-second period of rhythmic theta activity over the right temporal region without evolution CTA Head / Neck 5/27 >> no intracranial or extracranial flow reducing stenosis or emergent large vessel occlusion  CT Head Perfusion 5/27 >> relative increased cerebral blood flow & cerebral blood volume in the right hemisphere as compared to the left, correlates with suspected status / epileptiform activity EEG 5/29 >> abnormal continuous EEG due to burst suppression with occasional right central LPD's. No ictal evolution.  Pattern consistent with a diffuse cerebral disturbance and a superimposed focal epileptogenic disturbance on the right.  LPD's could be representative of an injury pattern given their persistence despite AED treatment.   CULTURES:   ANTIBIOTICS: Zosyn 5/27 >> 02/01/2018  SIGNIFICANT EVENTS: 5/23  Admit, intubated with concern for seizure 5/24  Extubated  5/24  Neuro rec's continuing dual anticonvulsant therapy until non-compliance can be confirmed. 5/27  On floor, had left sided weakness felt to be Todd seizure, reintubated 5/30  Ongoing concern for seizure, remains suppressed  02/01/2018 heavy sedation for discontinued  LINES/TUBES: L [place] TLC 5/27 >> ETT 5/27 >>    DISCUSSION: 77 year old with a distant history of a right frontal CVA with encephalomalacia on his CT scan who is being treated for status epilepticus with sedation to burst suppression.  He also has a history of O2 dependent chronic lung disease. Status post being treated for suppression status epilepticus with sedation being discontinued on 02/01/2018  ASSESSMENT / PLAN:  NEUROLOGIC A:   Seizure Disorder Hx CVA P:   Per neurology Antiseizure medication per neurology Heavy sedation been stopped by neurology on 02/01/2018 Appreciate neurology's help  PULMONARY A: O2 Dependent COPD  Rule Out Aspiration  Respiratory Acidosis - corrected  P: Continue bronchodilator Intermittent chest x-ray  CARDIOVASCULAR A:  Hypertension  Hx AVR, CAD Hypotension secondary to sedation P:  Suspect Neo-Synephrine can be weaned off when sedation is completely stopped Hold antihypertensives for now although he is on half dose Avapro  RENAL Lab Results  Component Value Date   CREATININE 0.98 02/01/2018   CREATININE 0.91 01/31/2018   CREATININE 1.07 01/30/2018  Recent Labs  Lab 01/30/18 0322 01/31/18 0255 02/01/18 0400  K 3.8 3.2* 3.4*    A:   Hypokalemia  P:   Trend BMP Electrolytes as needed Avoid nephrotoxins  GASTROINTESTINAL A:   Moderate Nutritional Risk  P:   Feedings per tube   HEMATOLOGIC Recent Labs    01/30/18 0322 02/01/18 0400  HGB 9.7* 9.3*    A:   Anemia - without bleeding, suspect of chronic disease P:  Transfuse per protocol Trend CBC DVT prophylaxis  INFECTIOUS A:   Rule Out Aspiration  P:  Procalcitonin on 531 2019- is 0.10 therefore will discontinue Zosyn on 02/01/2018  ENDOCRINE CBG (last 3)  No results for input(s): GLUCAP in the last 72 hours.   A:   Euglycemic   P:   02/01/2018 glucose 141 bmet   App cct 35 min   Richardson Landry Jazzlin Clements ACNP Maryanna Shape PCCM Pager [phone] till 1 pm If no answer page [phone] 02/01/2018, 8:27  AM

## 2018-02-01 NOTE — Care Management Note (Signed)
Case Management Note  Patient Details  Name: Danny Krause MRN: [id number] Date of Birth: Jan 09, 1941  Subjective/Objective: Pt admitted on 01/24/18 with seizure and respiratory failure.  PTA, pt resides at home with his wife, who he has been caring for.  Pt is on home oxygen, mostly at night, per son.                      Action/Plan: Pt currently remains intubated.  Will continue to follow for discharge planning as pt progresses.  Expected Discharge Date:                  Expected Discharge Plan:     In-House Referral:     Discharge planning Services     Post Acute Care Choice:    Choice offered to:     DME Arranged:    DME Agency:     HH Arranged:    HH Agency:     Status of Service:     If discussed at [employer], dates discussed:    Additional Comments:  Reinaldo Raddle, RN, BSN  Trauma/Neuro ICU Case Manager [phone]

## 2018-02-01 NOTE — Progress Notes (Signed)
Orthopedic Tech Progress Note Patient Details:  Danny Krause 1940/12/22 [id number]  Ortho Devices Type of Ortho Device: Prafo boot/shoe Ortho Device/Splint Location: bilateral Ortho Device/Splint Interventions: Application   Post Interventions Patient Tolerated: Well Instructions Provided: Care of device   Hildred Priest 02/01/2018, 10:42 AM

## 2018-02-01 NOTE — Progress Notes (Signed)
PT CancellationDischarge Note  Patient Details Name: Danny Krause MRN: [id number] DOB: 05-22-1941   Cancelled Treatment:    Reason Eval/Treat Not Completed: Patient not medically ready; patient weaned from sedation, but RN reports will be a few days until he is ready for PT.  Will d/c PT and await new orders when pt appropriate.    Reginia Naas 02/01/2018, 11:41 AM

## 2018-02-01 NOTE — Progress Notes (Addendum)
Subjective: No seizures as sedation was being weaned.  Exam: Vitals:   02/01/18 0800 02/01/18 0807  BP: 123/60 123/60  Pulse: 62   Resp: (!) 24   Temp:    SpO2: 99% 98%   Gen: In bed, intubated Resp: ventilated Abd: soft, nt  Neuro: PERRL, weak corneals, He minimally withdraws vs flexion in left leg to nailbed pressure, otherwise no movement to nox stim.   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 He had an increased in LPDs with stopping sedationa nd so he was started on topiramate and sedation was resumed. Sedation was weaned on the evening of 5/30.  Right-sided discharges appear to be improving.   Recommendations: 1) keppra 1.5g bid 2)  dilantin 150mg  TID.  3) vimpat 150mg  BID 4) topiramate 200mg  BID 5) d/c sedation  This patient is critically ill and at significant risk of neurological worsening, death and care requires constant monitoring of vital signs, hemodynamics,respiratory and cardiac monitoring, neurological assessment, discussion with family, other specialists and medical decision making of high complexity. I spent 45 minutes of neurocritical care time  in the care of  this patient.  Roland Rack, MD [facility] [phone]  If 7pm- 7am, please page neurology on call as listed in [place]. 02/01/2018  9:18 AM

## 2018-02-01 NOTE — Progress Notes (Signed)
Nutrition Follow-up  INTERVENTION:   Vital High Protein @ 50 ml/hr (1200 ml/day)  Provides: 1200 kcals (1305 kcal with propofol), 105 grams protein, 1003 ml free water  NUTRITION DIAGNOSIS:   Inadequate oral intake related to inability to eat as evidenced by NPO status. Ongoing.   GOAL:   Patient will meet greater than or equal to 90% of their needs Met.   MONITOR:   Diet advancement, Weight trends, Labs, TF tolerance, Vent status, I & O's  ASSESSMENT:   Patient with PMH significant for right frontal CVA, COPD, CAD s/p CABG and AVR. Recently hospitalized in April for seizure and respiratory failure. Presents this admission with seizure in the setting of severe acidosis and acute respiratory failure requiring intubation.   Pt discussed during ICU rounds and with RN.  Propofol stopped today  5/29 TF started after bm  Patient is currently intubated on ventilator support MV: 14.3 L/min Temp (24hrs), Avg:98 F (36.7 C), Min:96.4 F (35.8 C), Max:99.2 F (37.3 C)  Propofol: off  Medications reviewed: MVI Labs reviewed: K+ 3.4 (L), TG: 249 (H)     Diet Order:   Diet Order           Diet NPO time specified  Diet effective now          EDUCATION NEEDS:   Not appropriate for education at this time  Skin:  Skin Assessment: Reviewed RN Assessment  Last BM:  5/31  Height:   Ht Readings from Last 1 Encounters:  01/28/18 5' 5" (1.651 m)    Weight:   Wt Readings from Last 1 Encounters:  01/31/18 147 lb 11.3 oz (67 kg)    Ideal Body Weight:  61.8 kg  BMI:  Body mass index is 24.58 kg/m.  Estimated Nutritional Needs:   Kcal:  1504  Protein:  85-95 grams  Fluid:  >1.5 L/day    RD, LDN, CNSC [phone] Pager [phone] After Hours Pager  

## 2018-02-01 NOTE — Clinical Social Work Note (Signed)
Clinical Social Worker continuing to follow patient and family for support and discharge planning needs.  Patient is now on the ventilator and not appropriate for placement options at this time.  CSW remains available and will follow up with family once discharge disposition is determined.  Barbette Or, [place]

## 2018-02-01 NOTE — Progress Notes (Signed)
LTM maintenance. No skin breakdown was seen.

## 2018-02-01 NOTE — Procedures (Signed)
LTM-EEG Report  HISTORY: Continuous video-EEG monitoring in EMU performed for39year old with encephalopathy, R F stroke, abnormal EEG. ACQUISITION: International 10-20 system for electrode placement; 18 channels with additional eyes linked to ipsilateral ears and EKG. Additional T1-T2 electrodes were used. Continuous video recording obtained.   EEG NUMBER: MEDICATIONS:  Day 1-3: see EMR Day 4: see EMR  DAY #4: from07305/30/19 to 0730 02/01/18  BACKGROUND:The record consisted of a resolving burst-suppression pattern with current background medium voltage, irregular 2-6Hz  activity bilaterally with slower frequencies and less consistent background over the right. This was background was improved over the past few days. Some infrequent brief periods of voltage attenuation were seen as sedation was listed (asymmetric, maximal over the right).  PERIODIC ACTIVITY: There were unsustained right central LPDs seen as sedation was listed. These were sharp or blunted in morphology, occasionally had some overriding faster frequencies, occurred with frequency 0.5hz  without ictal evolution. The LPDs were less confluent as compared to previous days' recordings.  SEIZURES: none  EVENTS: none reported   EKG: no significant arrhythmia  SUMMARY: This was an abnormal continuous video EEG due toa resolved burst suppression pattern and intermittent right central LPDs. The LPDs were less confluent as compared to previous days and did not show ictal evolution.

## 2018-02-02 ENCOUNTER — Inpatient Hospital Stay (HOSPITAL_COMMUNITY): Payer: Medicare Other

## 2018-02-02 DIAGNOSIS — J449 Chronic obstructive pulmonary disease, unspecified: Secondary | ICD-10-CM

## 2018-02-02 HISTORY — PX: TRACHEOSTOMY CLOSURE: SHX6132

## 2018-02-02 LAB — POCT I-STAT 3, ART BLOOD GAS (G3+)
ACID-BASE DEFICIT: 4 mmol/L — AB (ref 0.0–2.0)
BICARBONATE: 21.4 mmol/L (ref 20.0–28.0)
O2 Saturation: 97 %
PO2 ART: 98 mmHg (ref 83.0–108.0)
TCO2: 23 mmol/L (ref 22–32)
pCO2 arterial: 39.5 mmHg (ref 32.0–48.0)
pH, Arterial: 7.345 — ABNORMAL LOW (ref 7.350–7.450)

## 2018-02-02 LAB — BASIC METABOLIC PANEL
Anion gap: 4 — ABNORMAL LOW (ref 5–15)
BUN: 26 mg/dL — ABNORMAL HIGH (ref 6–20)
CHLORIDE: 117 mmol/L — AB (ref 101–111)
CO2: 24 mmol/L (ref 22–32)
CREATININE: 0.78 mg/dL (ref 0.61–1.24)
Calcium: 8.1 mg/dL — ABNORMAL LOW (ref 8.9–10.3)
GFR calc non Af Amer: >60 mL/min (ref >60)
Glucose, Bld: 138 mg/dL — ABNORMAL HIGH (ref 65–99)
Potassium: 3.6 mmol/L (ref 3.5–5.1)
Sodium: 145 mmol/L (ref 135–145)

## 2018-02-02 LAB — PHENYTOIN LEVEL, TOTAL: Phenytoin Lvl: 14 ug/mL (ref 10.0–20.0)

## 2018-02-02 LAB — PROCALCITONIN: Procalcitonin: 0.1 ng/mL

## 2018-02-02 LAB — PHOSPHORUS: PHOSPHORUS: 2.6 mg/dL (ref 2.5–4.6)

## 2018-02-02 LAB — MAGNESIUM: Magnesium: 1.9 mg/dL (ref 1.7–2.4)

## 2018-02-02 MED ORDER — IRBESARTAN 150 MG PO TABS
300.0000 mg | ORAL_TABLET | Freq: Every day | ORAL | Status: DC
  Administered 2018-02-02 – 2018-02-05 (×4): 300 mg via ORAL
  Filled 2018-02-02 (×5): qty 2

## 2018-02-02 NOTE — Progress Notes (Signed)
Continue left subclavian central line for one more day per Dr Pearline Cables

## 2018-02-02 NOTE — Progress Notes (Signed)
PULMONARY / CRITICAL CARE MEDICINE   Name: Danny Krause MRN: [id number] DOB: Jun 06, 1941    ADMISSION DATE:  01/24/2018 CONSULTATION DATE:  01/24/2018  REFERRING MD:  Maudie Mercury  CHIEF COMPLAINT:  Seizures  BRIEF SUMMARY:   77 y/o M who presented 5/23 after being found unresponsive at home with concern for seizure like activity.  He was intubated in the ER for hypercapnic respiratory failure.  He has a history of CVA with encephalomalacia and seizures (first dx 12/2017), on Keppra with questionable compliance.  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   On the floor 5/27, he developed new left-sided weakness and a CT angiogram was performed which showed no evidence of large vessel occlusion.  Neurology consulted.  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  In light of his extremely poor baseline respiratory function and prior CO2 retention during this admission only 1 mg of Ativan was administered on the floor after which there was still seizure activity on the EEG but the patient was profoundly somnolent.  It was elected elected to transfer him to ICU for intubation to allow deep sedation to burst suppression.  He required as much as 80 mcg of propofol, 20 mg of Versed for sedation and neo-synephrine for BP support.     SUBJECTIVE: He is on no sedation this morning.  He is not at all interactive.  He is on no pressors.  He continues to be intubated and mechanically ventilated.  VITAL SIGNS: BP (!) 158/77   Pulse 88   Temp 99.2 F (37.3 C) (Axillary)   Resp (!) 40   Ht 5\' 5"  (1.651 m)   Wt 147 lb 11.3 oz (67 kg)   SpO2 100%   BMI 24.58 kg/m   HEMODYNAMICS:  On no vasopressors.  VENTILATOR SETTINGS: Vent Mode: PRVC FiO2 (%):  [30 %] 30 % Set Rate:  [21 bmp] 21 bmp Vt Set:  [500 mL] 500 mL PEEP:  [5 cmH20] 5 cmH20 Plateau Pressure:  [13 cmH20-20 cmH20] 19 cmH20  INTAKE / OUTPUT: I/O last 3 completed shifts: In: 2843.9  [I.V.:480.9; NG/GT:1266; IV Piggyback:1097] Out: 9562 [Urine:1425]  PHYSICAL EXAMINATION: General: This is an elderly male who is orally intubated and mechanically ventilated.  He is in no acute distress.   HEENT: Endotracheal tube and NG tube in place Neuro: No response to voice or sternal rub for me.  Pupils are equal and reactive.   CV: S1 and S2 are regular without murmur rub or gallop, there is no JVD  PULM: Respirations are unlabored, he is breathing above the set ventilator rate, there is symmetric air movement, no wheezes.  GI: Soft without any masses tenderness guarding or rebound  Extremities: warm/dry, negative edema  Skin: no rashes or lesions   LABS:  BMET Recent Labs  Lab 01/31/18 0255 02/01/18 0400 02/02/18 0346  NA 141 142 145  K 3.2* 3.4* 3.6  CL 112* 115* 117*  CO2 21* 21* 24  BUN 12 20 26*  CREATININE 0.91 0.98 0.78  GLUCOSE 83 142* 138*    Electrolytes Recent Labs  Lab 01/29/18 0238  01/31/18 0255 02/01/18 0400 02/02/18 0346  CALCIUM 8.4*   < > 8.3* 8.1* 8.1*  MG 2.4  --  2.1  --  1.9  PHOS  --   --   --   --  2.6   < > = values in this interval not displayed.    CBC Recent Labs  Lab 01/29/18 0238 01/30/18 0322 02/01/18 0400  WBC 9.3 7.4 7.3  HGB 10.2* 9.7* 9.3*  HCT 32.8* 32.5* 31.4*  PLT 291 233 245    Coag's No results for input(s): APTT, INR in the last 168 hours.  Sepsis Markers Recent Labs  Lab 01/31/18 0846 02/01/18 0400 02/02/18 0346  PROCALCITON 0.11 0.10 0.10    ABG Recent Labs  Lab 01/30/18 0857 01/30/18 1430 01/31/18 0335  PHART 7.288* 7.323* 7.321*  PCO2ART 42.8 37.7 38.5  PO2ART 130.0* 93.0 119*    Liver Enzymes Recent Labs  Lab 01/29/18 0238  AST 17  ALT 14*  ALKPHOS 53  BILITOT 0.7  ALBUMIN 3.1*    Cardiac Enzymes No results for input(s): TROPONINI, PROBNP in the last 168 hours.  Glucose Recent Labs  Lab 01/28/18 1450  GLUCAP 104*    Imaging No results found.   STUDIES:  EEG  showed:  1. Moderate diffuse background slowing 2. Lateralized periodic discharges (LPDs) over the right frontotemporal region - consistent with possible epileptogenic focus. 3. Brief 5-second period of rhythmic theta activity over the right temporal region without evolution CTA Head / Neck 5/27 >> no intracranial or extracranial flow reducing stenosis or emergent large vessel occlusion  CT Head Perfusion 5/27 >> relative increased cerebral blood flow & cerebral blood volume in the right hemisphere as compared to the left, correlates with suspected status / epileptiform activity EEG 5/29 >> abnormal continuous EEG due to burst suppression with occasional right central LPD's. No ictal evolution.  Pattern consistent with a diffuse cerebral disturbance and a superimposed focal epileptogenic disturbance on the right.  LPD's could be representative of an injury pattern given their persistence despite AED treatment.   CULTURES:   ANTIBIOTICS: Zosyn 5/27 >> 02/01/2018  SIGNIFICANT EVENTS: 5/23  Admit, intubated with concern for seizure 5/24  Extubated  5/24  Neuro rec's continuing dual anticonvulsant therapy until non-compliance can be confirmed. 5/27  On floor, had left sided weakness felt to be Todd seizure, reintubated 5/30  Ongoing concern for seizure, remains suppressed  02/01/2018 heavy sedation for discontinued  LINES/TUBES: L [place] TLC 5/27 >> ETT 5/27 >>   DISCUSSION: 77 year old with a distant history of a right frontal CVA with encephalomalacia on his CT scan who was treated for status epilepticus with sedation to burst suppression.  He also has a history of O2 dependent chronic lung disease.  He is not yet emerged from deep sedation.   ASSESSMENT / PLAN:  NEUROLOGIC A:   Seizure Disorder Hx CVA P:   He continues on Vimpat Keppra Dilantin and Topamax for chronic AEDs.  Sedation with propofol and Versed which was in place to induce burst suppression has been  discontinued  PULMONARY A: O2 Dependent COPD  Rule Out Aspiration  Respiratory Acidosis - corrected  P: Continue bronchodilator Current mental status precludes extubation.  This is ventilator day 5.   CARDIOVASCULAR A:  Hypertension  Hx AVR, CAD Hypotension secondary to sedation P:  He has a history of hypertension and now that the propofol and Versed have been discontinued his blood pressure beginning to rise.  I am increasing his Avapro towards his usual dose.    RENAL Lab Results  Component Value Date   CREATININE 0.78 02/02/2018   CREATININE 0.98 02/01/2018   CREATININE 0.91 01/31/2018   Recent Labs  Lab 01/31/18 0255 02/01/18 0400 02/02/18 0346  K 3.2* 3.4* 3.6    A:   Hypokalemia  P:   Trend BMP Electrolytes as  needed Avoid nephrotoxins  GASTROINTESTINAL A:   Moderate Nutritional Risk  P:   Feedings per tube   HEMATOLOGIC Recent Labs    02/01/18 0400  HGB 9.3*    A:   Anemia - without bleeding, suspect of chronic disease P:  Transfuse per protocol Trend CBC DVT prophylaxis  INFECTIOUS A:   Rule Out Aspiration  P:  Procalcitonin on 531 2019- is 0.10 therefore will discontinue Zosyn on 02/01/2018  ENDOCRINE CBG (last 3)  No results for input(s): GLUCAP in the last 72 hours.   A:   Euglycemic   P:   02/01/2018 glucose 141 bmet      Lars Masson, MD PCCM [phone] 02/02/2018, 7:57 AM

## 2018-02-02 NOTE — Progress Notes (Signed)
Pt consistently tachypneic, RRs 30s-40s, start pt on 15 mcg/kg/min per Dr. Pearline Cables

## 2018-02-02 NOTE — Procedures (Signed)
LTM-EEG Report  HISTORY: Continuous video-EEG monitoring in EMU performed for89year old with encephalopathy, R F stroke, abnormal EEG. ACQUISITION: International 10-20 system for electrode placement; 18 channels with additional eyes linked to ipsilateral ears and EKG. Additional T1-T2 electrodes were used. Continuous video recording obtained.   EEG NUMBER: MEDICATIONS:  Day 1-3: see EMR Day 4: see EMR Day 5: see EMR  DAY #4: from07305/30/19 to 0730 02/01/18  BACKGROUND:The record consistedof a resolving burst-suppression pattern with current background medium voltage, irregular 2-6Hz  activity bilaterally with slower frequencies and less consistent background over the right. This was background was improved over the past few days. Some infrequent brief periods of voltage attenuation were seen as sedation was listed (asymmetric, maximal over the right).  PERIODIC ACTIVITY: There were unsustained right central LPDs seen as sedation was listed. These were sharp or blunted in morphology, occasionally had some overriding faster frequencies, occurred with frequency 0.5hz  without ictal evolution. The LPDs were less confluent as compared to previous days' recordings.  SEIZURES: none  EVENTS: none reported   DAY #5: from07305/31/19 to 0730 02/02/18  BACKGROUND:The record consisted of a medium voltage continuous background with improved spontaneous variability and reactivity. The background activity consisted primarily of semirhythmic 2-7Hz  activity some mild asymmetry (slower frequencies over the right). Periods of discontinuity were not seen. State changes were seen, however no clear posterior basic rhythm was observed.  PERIODIC ACTIVITY: The previously identified right central LPDs were now more blunted, less frequent (only 0.25-0.5Hz  at most, quasi-periodic) and improved in waking. Occasional isolated right central sharp waves with superimposed fast activity were seen. These discharges were much  improved over the past 24 hours. SEIZURES: none  EVENTS: none reported   EKG: no significant arrhythmia  SUMMARY: This was an abnormal continuous video EEG due to generalized slowing, resolving LPDs and sharp waves in the right central region, indicative of a diffuse cerebral disturbance with superimposed epileptogenic disturbance over the right. Background activity and periodic discharges have improved over the past 24 hours.

## 2018-02-03 ENCOUNTER — Inpatient Hospital Stay (HOSPITAL_COMMUNITY): Payer: Medicare Other

## 2018-02-03 LAB — GLUCOSE, CAPILLARY
GLUCOSE-CAPILLARY: 104 mg/dL — AB (ref 65–99)
GLUCOSE-CAPILLARY: 114 mg/dL — AB (ref 65–99)
GLUCOSE-CAPILLARY: 114 mg/dL — AB (ref 65–99)
GLUCOSE-CAPILLARY: 118 mg/dL — AB (ref 65–99)
GLUCOSE-CAPILLARY: 124 mg/dL — AB (ref 65–99)

## 2018-02-03 LAB — BASIC METABOLIC PANEL
ANION GAP: 6 (ref 5–15)
BUN: 22 mg/dL — ABNORMAL HIGH (ref 6–20)
CHLORIDE: 113 mmol/L — AB (ref 101–111)
CO2: 23 mmol/L (ref 22–32)
Calcium: 8.3 mg/dL — ABNORMAL LOW (ref 8.9–10.3)
Creatinine, Ser: 0.75 mg/dL (ref 0.61–1.24)
GFR calc non Af Amer: >60 mL/min (ref >60)
Glucose, Bld: 121 mg/dL — ABNORMAL HIGH (ref 65–99)
POTASSIUM: 3.5 mmol/L (ref 3.5–5.1)
SODIUM: 142 mmol/L (ref 135–145)

## 2018-02-03 LAB — MAGNESIUM: MAGNESIUM: 2 mg/dL (ref 1.7–2.4)

## 2018-02-03 LAB — PHENYTOIN LEVEL, TOTAL: Phenytoin Lvl: 13.5 ug/mL (ref 10.0–20.0)

## 2018-02-03 LAB — TRIGLYCERIDES: TRIGLYCERIDES: 230 mg/dL — AB (ref <150)

## 2018-02-03 MED ORDER — ROCURONIUM BROMIDE 100 MG/10ML IV SOLN
50.0000 mg | Freq: Once | INTRAVENOUS | Status: DC
  Administered 2018-02-03: 50 mg via INTRAVENOUS
  Filled 2018-02-03: qty 5

## 2018-02-03 MED ORDER — ETOMIDATE 2 MG/ML IV SOLN
0.3000 mg/kg | Freq: Once | INTRAVENOUS | Status: AC
  Administered 2018-02-03: 20 mg via INTRAVENOUS

## 2018-02-03 MED ORDER — ROCURONIUM BROMIDE 50 MG/5ML IV SOLN
50.0000 mg | Freq: Once | INTRAVENOUS | Status: AC
  Filled 2018-02-03: qty 5

## 2018-02-03 MED ORDER — PROPOFOL 1000 MG/100ML IV EMUL
5.0000 ug/kg/min | INTRAVENOUS | Status: DC
  Administered 2018-02-03: 25 ug/kg/min via INTRAVENOUS
  Administered 2018-02-04: 12.5 ug/kg/min via INTRAVENOUS
  Filled 2018-02-03 (×4): qty 100

## 2018-02-03 NOTE — Procedures (Signed)
LTM-EEG Report  HISTORY: Continuous video-EEG monitoring in EMU performed for17year old with encephalopathy, R F stroke, abnormal EEG. ACQUISITION: International 10-20 system for electrode placement; 18 channels with additional eyes linked to ipsilateral ears and EKG. Additional T1-T2 electrodes were used. Continuous video recording obtained.   EEG NUMBER: MEDICATIONS:  Day 1-3:see EMR Day4: see EMR Day 5: see EMR Day 6: see EMR  DAY #4: from07305/30/19 to 0730 02/01/18  BACKGROUND:The record consistedof a resolving burst-suppression pattern with current background medium voltage, irregular 2-6Hz  activity bilaterally with slower frequencies and less consistent background over the right. This was background was improved over the past few days. Some infrequent brief periods of voltage attenuation were seen as sedation was listed (asymmetric, maximal over the right). PERIODIC ACTIVITY:There were unsustained right central LPDs seen as sedation was listed. These were sharp or blunted in morphology, occasionally had some overriding faster frequencies, occurred with frequency 0.5hz  without ictal evolution. The LPDs were less confluent as compared to previous days' recordings. SEIZURES: none  EVENTS: none reported   DAY #5: from07305/31/19 to 0730 02/02/18  BACKGROUND:The record consisted of a medium voltage continuous background with improved spontaneous variability and reactivity. The background activity consisted primarily of semirhythmic 2-7Hz  activity some mild asymmetry (slower frequencies over the right). Periods of discontinuity were not seen. State changes were seen, however no clear posterior basic rhythm was observed.  PERIODIC ACTIVITY:The previously identified right central LPDs were now more blunted, less frequent (only 0.25-0.5Hz  at most, quasi-periodic) and improved in waking. Occasional isolated right central sharp waves with superimposed fast activity were seen. These  discharges were much improved over the past 24 hours. SEIZURES: none  EVENTS: none reported   DAY #6: from07306/1/19 to 0730 02/03/18  BACKGROUND:The record consisted of a medium voltage continuous background with improved spontaneous variability and reactivity. The background activity consisted primarily of semirhythmic 2-7Hz  activity some mild asymmetry (slower frequencies over the right). Periods of discontinuity were not seen. State changes were seen, however no clear posterior basic rhythm was observed.  PERIODIC ACTIVITY:The previously identified right central LPDs were again now more blunted, less frequent (only 0.25-0.5Hz  at most, quasi-periodic) and improved in waking. Occasional isolated right central sharp waves with superimposed fast activity were seen. These discharges were much improved over the past 24-48 hours. SEIZURES: none  EVENTS: none reported   EKG: no significant arrhythmia  SUMMARY: This was an abnormal continuous video EEG due to generalized slowing, resolving LPDs and sharp waves in the right central region, indicative of a diffuse cerebral disturbance with superimposed epileptogenic disturbance over the right. Background activity and periodic discharges have improved over the past 24-48 hours. No seizures were seen.

## 2018-02-03 NOTE — Progress Notes (Signed)
Subjective: No seizures off sedation x 24 hours.   Exam: Vitals:   02/03/18 0741 02/03/18 0800  BP: (!) 145/71 139/72  Pulse: 93 94  Resp: (!) 36 (!) 26  Temp:  99.4 F (37.4 C)  SpO2: 97% 99%   Gen: In bed, intubated Resp: ventilated Abd: soft, nt  Neuro: PERRL, weak corneals, He minimally withdraws vs flexion in left leg to nailbed pressure, otherwise no movement to nox stim.   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 He had an increased in LPDs with stopping sedationa nd so he was started on topiramate and sedation was resumed. Sedation was weaned on the evening of 5/30 and stopped completely the morning of 5/31.   Right-sided discharges appear to be improving.   Recommendations: 1) keppra 1.5g bid 2)  dilantin 150mg  TID.  3) vimpat 150mg  BID 4) topiramate 200mg  BID 5) continue to hold sedation  This patient is critically ill and at significant risk of neurological worsening, death and care requires constant monitoring of vital signs, hemodynamics,respiratory and cardiac monitoring, neurological assessment, discussion with family, other specialists and medical decision making of high complexity. I spent 45 minutes of neurocritical care time  in the care of  this patient.  Roland Rack, MD [facility] [phone]  If 7pm- 7am, please page neurology on call as listed in [place]. 02/03/2018  9:19 AM

## 2018-02-03 NOTE — Progress Notes (Signed)
vLTM EEG complete. No skin breakdown 

## 2018-02-03 NOTE — Progress Notes (Signed)
PULMONARY / CRITICAL CARE MEDICINE   Name: Danny Krause MRN: [id number] DOB: [birth year]    ADMISSION DATE:  01/24/2018 CONSULTATION DATE:  01/24/2018  REFERRING MD:  Maudie Mercury  CHIEF COMPLAINT:  Seizures  BRIEF SUMMARY:   77 y/o M who presented 5/23 after being found unresponsive at home with concern for seizure like activity.  He was intubated in the ER for hypercapnic respiratory failure.  He has a history of CVA with encephalomalacia and seizures (first dx 12/2017), on Keppra with questionable compliance.  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   On the floor 5/27, he developed new left-sided weakness and a CT angiogram was performed which showed no evidence of large vessel occlusion.  Neurology consulted.  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  In light of his extremely poor baseline respiratory function and prior CO2 retention during this admission only 1 mg of Ativan was administered on the floor after which there was still seizure activity on the EEG but the patient was profoundly somnolent.  It was elected elected to transfer him to ICU for intubation to allow deep sedation to burst suppression.  He required as much as 80 mcg of propofol,  And 20 mg/hr Versed for sedation to burst supression and neo-synephrine for BP support.     SUBJECTIVE: He is on 50 mcg of propofol this morning and no Versed.  He is not at all interactive.  He is on no pressors.  Continues to be intubated and mechanically ventilated.  VITAL SIGNS: BP (!) 153/79   Pulse 93   Temp 99.4 F (37.4 C) (Axillary)   Resp (!) 40   Ht 5\' 5"  (1.651 m)   Wt 147 lb 11.3 oz (67 kg)   SpO2 98%   BMI 24.58 kg/m   HEMODYNAMICS:  On no vasopressors.  VENTILATOR SETTINGS: Vent Mode: PRVC FiO2 (%):  [30 %] 30 % Set Rate:  [21 bmp] 21 bmp Vt Set:  [500 mL] 500 mL PEEP:  [5 cmH20] 5 cmH20 Plateau Pressure:  [13 cmH20-21 cmH20] 20 cmH20  INTAKE / OUTPUT: I/O  last 3 completed shifts: In: 2424.9 [I.V.:67.9; NG/GT:1460; IV FKCLEXNTZ:001] Out: 2480 [Urine:2480]  PHYSICAL EXAMINATION: General: This is an elderly male who is orally intubated and mechanically ventilated.  He is in no distress.    HEENT: Endotracheal tube and NG tube in place Neuro: No response to voice or sternal rub for me.  Nipples are equal and reactive.    CV: S1 and S2 are regular without murmur rub or gallop, there is no overt JVD there is 1/2+ anasarca   PULM: Respirations are unlabored, he is breathing above the set ventilator rate, there is symmetric air movement, no wheezes.  GI: Soft without any masses tenderness guarding or rebound  Extremities: warm/dry, negative edema  Skin: no rashes or lesions   LABS:  BMET Recent Labs  Lab 02/01/18 0400 02/02/18 0346 02/03/18 0608  NA 142 145 142  K 3.4* 3.6 3.5  CL 115* 117* 113*  CO2 21* 24 23  BUN 20 26* 22*  CREATININE 0.98 0.78 0.75  GLUCOSE 142* 138* 121*    Electrolytes Recent Labs  Lab 01/31/18 0255 02/01/18 0400 02/02/18 0346 02/03/18 0608  CALCIUM 8.3* 8.1* 8.1* 8.3*  MG 2.1  --  1.9 2.0  PHOS  --   --  2.6  --     CBC Recent Labs  Lab 01/29/18 0238 01/30/18 0322 02/01/18  0400  WBC 9.3 7.4 7.3  HGB 10.2* 9.7* 9.3*  HCT 32.8* 32.5* 31.4*  PLT 291 233 245    Coag's No results for input(s): APTT, INR in the last 168 hours.  Sepsis Markers Recent Labs  Lab 01/31/18 0846 02/01/18 0400 02/02/18 0346  PROCALCITON 0.11 0.10 0.10    ABG Recent Labs  Lab 01/30/18 1430 01/31/18 0335 02/02/18 0933  PHART 7.323* 7.321* 7.345*  PCO2ART 37.7 38.5 39.5  PO2ART 93.0 119* 98.0    Liver Enzymes Recent Labs  Lab 01/29/18 0238  AST 17  ALT 14*  ALKPHOS 53  BILITOT 0.7  ALBUMIN 3.1*    Cardiac Enzymes No results for input(s): TROPONINI, PROBNP in the last 168 hours.  Glucose Recent Labs  Lab 01/28/18 1450 02/03/18 0049 02/03/18 0327  GLUCAP 104* 104* 114*    Imaging No  results found.   STUDIES:  EEG showed:  1. Moderate diffuse background slowing 2. Lateralized periodic discharges (LPDs) over the right frontotemporal region - consistent with possible epileptogenic focus. 3. Brief 5-second period of rhythmic theta activity over the right temporal region without evolution CTA Head / Neck 5/27 >> no intracranial or extracranial flow reducing stenosis or emergent large vessel occlusion  CT Head Perfusion 5/27 >> relative increased cerebral blood flow & cerebral blood volume in the right hemisphere as compared to the left, correlates with suspected status / epileptiform activity EEG 5/29 >> abnormal continuous EEG due to burst suppression with occasional right central LPD's. No ictal evolution.  Pattern consistent with a diffuse cerebral disturbance and a superimposed focal epileptogenic disturbance on the right.  LPD's could be representative of an injury pattern given their persistence despite AED treatment.   CULTURES:   ANTIBIOTICS: Zosyn 5/27 >> 02/01/2018  SIGNIFICANT EVENTS: 5/23  Admit, intubated with concern for seizure 5/24  Extubated  5/24  Neuro rec's continuing dual anticonvulsant therapy until non-compliance can be confirmed. 5/27  On floor, had left sided weakness felt to be Todd seizure, reintubated 5/30  Ongoing concern for seizure, remains suppressed  02/01/2018 heavy sedation for burst supression discontinued  LINES/TUBES: L [place] TLC 5/27 >> ETT 5/27 >>   DISCUSSION: 77 year old with a distant history of a right frontal CVA with encephalomalacia on his CT scan who was treated for status epilepticus with sedation to burst suppression.  He also has a history of O2 dependent chronic lung disease.  He has not yet emerged from deep sedation, and continues to require mechanical ventilation.   ASSESSMENT / PLAN:  NEUROLOGIC A:   Seizure Disorder Hx CVA P:   He continues on Vimpat Keppra Dilantin and Topamax for chronic AEDs.  Sedation  with propofol and Versed which was in place to induce burst suppression has been discontinued.  Propofol at this point is being used only at sedative doses.  PULMONARY A: O2 Dependent COPD  Rule Out Aspiration  Respiratory Acidosis - corrected  P: 10 you current bronchodilators  Current mental status precludes extubation.  This is ventilator day 6.   CARDIOVASCULAR A:  Hypertension  Hx AVR, CAD Hypotension secondary to sedation P:  He has a history of hypertension and he is now back on his usual dose of Avapro.    Lab Results  Component Value Date   CREATININE 0.75 02/03/2018   CREATININE 0.78 02/02/2018   CREATININE 0.98 02/01/2018   Recent Labs  Lab 02/01/18 0400 02/02/18 0346 02/03/18 0608  K 3.4* 3.6 3.5    A:   Hypokalemia  P:   Trend BMP Electrolytes as needed Avoid nephrotoxins  GASTROINTESTINAL A:   Moderate Nutritional Risk  P:   Feedings per tube   HEMATOLOGIC Recent Labs    02/01/18 0400  HGB 9.3*    A:   Anemia - without bleeding, suspect of chronic disease P:  Transfuse per protocol Trend CBC DVT prophylaxis  INFECTIOUS A:   Rule Out Aspiration  P:  He was initially covered with Zosyn for potential aspiration but we have had no progressive evidence of infection and that agent was discontinued.   ENDOCRINE CBG (last 3)  Recent Labs    02/03/18 0049 02/03/18 0327  GLUCAP 104* 114*     A:   Euglycemic   P:   02/01/2018 glucose 141 bmet      Lars Masson, MD [facility] [phone] 02/03/2018, 10:44 AM

## 2018-02-03 NOTE — Progress Notes (Signed)
ET tube was advanced, per order, from 23 cm to 25 cm.  Measured from lip.

## 2018-02-03 NOTE — Procedures (Signed)
Endotracheal intubation  Indication: The patient was inadvertently extubated while being bathed.  Although he did not drop his sats after extubation he had sonorous respirations and was labored with retractions.  Procedure: Patient was preoxygenated with an Ambu bag.  He was edentulous with a class I airway.  He was sedated with 20 mg of etomidate followed by 50 mg of rocuronium.  The cords were easily visualized with a 3 Miller blade and a 7.5 endotracheal tube passed to 21 cm at the lip.  There was a positive CO2 and saturation was 98%.  There was symmetric air movement.  Chest x-ray for placement is pending.

## 2018-02-03 NOTE — Progress Notes (Signed)
Upon finishing changing patient's bed, I noticed pt's ventilator was going off for low minute ventilation, RT was called to bedside. Patient maintained O2 sats but ET tube was dislodged. Dr. Pearline Cables was called to bedside immediately to reintubate.

## 2018-02-03 NOTE — Progress Notes (Signed)
Subjective: No seizures off sedation x 48 hours. Peridic discarges on EEG appear to be improving.   Exam: Vitals:   02/03/18 0741 02/03/18 0800  BP: (!) 145/71 139/72  Pulse: 93 94  Resp: (!) 36 (!) 26  Temp:  99.4 F (37.4 C)  SpO2: 97% 99%   Gen: In bed, intubated Resp: ventilated Abd: soft, nt  Neuro: PERRL, weak corneals, he withdraws minimally to nailbed pressure in bilateral UE, he winces to pain significantly more than on previous days, but does not open eyes.   Pertinent Labs: Dilantin level 13.5, can stop daily levels.   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 He had an increased in LPDs with stopping sedationa nd so he was started on topiramate and sedation was resumed. Sedation was weaned on the evening of 5/30 and stopped completely the morning of 5/31.   Right-sided discharges appear to be improving.   At this point, given the semiology resembling Large R MCA stroke prior to initiation of suppression, I would expect him to improve to at least that level. I would given him adequate time to demonstrate improvement. If no improvement, may need to wean AEDs prior to discussions regarding limiting care.   Recommendations: 1) keppra 1.5g bid 2) dilantin 150mg  TID.  3) vimpat 150mg  BID 4) topiramate 200mg  BID 5) continue to hold sedation  This patient is critically ill and at significant risk of neurological worsening, death and care requires constant monitoring of vital signs, hemodynamics,respiratory and cardiac monitoring, neurological assessment, discussion with family, other specialists and medical decision making of high complexity. I spent 45 minutes of neurocritical care time  in the care of  this patient.  Roland Rack, MD [facility] [phone]  If 7pm- 7am, please page neurology on call as listed in [place]. 02/03/2018  9:20 AM

## 2018-02-04 ENCOUNTER — Inpatient Hospital Stay (HOSPITAL_COMMUNITY): Payer: Medicare Other

## 2018-02-04 ENCOUNTER — Telehealth: Payer: Self-pay | Admitting: Pulmonary Disease

## 2018-02-04 DIAGNOSIS — J40 Bronchitis, not specified as acute or chronic: Secondary | ICD-10-CM

## 2018-02-04 DIAGNOSIS — G40909 Epilepsy, unspecified, not intractable, without status epilepticus: Secondary | ICD-10-CM | POA: Diagnosis present

## 2018-02-04 DIAGNOSIS — G40301 Generalized idiopathic epilepsy and epileptic syndromes, not intractable, with status epilepticus: Secondary | ICD-10-CM

## 2018-02-04 LAB — CBC WITH DIFFERENTIAL/PLATELET
ABS IMMATURE GRANULOCYTES: 0.2 10*3/uL — AB (ref 0.0–0.1)
BASOS ABS: 0 10*3/uL (ref 0.0–0.1)
Basophils Relative: 0 %
Eosinophils Absolute: 0.5 10*3/uL (ref 0.0–0.7)
Eosinophils Relative: 6 %
HCT: 30.5 % — ABNORMAL LOW (ref 39.0–52.0)
Hemoglobin: 9 g/dL — ABNORMAL LOW (ref 13.0–17.0)
IMMATURE GRANULOCYTES: 2 %
Lymphocytes Relative: 16 %
Lymphs Abs: 1.5 10*3/uL (ref 0.7–4.0)
MCH: 24.3 pg — ABNORMAL LOW (ref 26.0–34.0)
MCHC: 29.5 g/dL — ABNORMAL LOW (ref 30.0–36.0)
MCV: 82.2 fL (ref 78.0–100.0)
Monocytes Absolute: 1.1 10*3/uL — ABNORMAL HIGH (ref 0.1–1.0)
Monocytes Relative: 11 %
NEUTROS ABS: 6.1 10*3/uL (ref 1.7–7.7)
NEUTROS PCT: 65 %
PLATELETS: 354 10*3/uL (ref 150–400)
RBC: 3.71 MIL/uL — AB (ref 4.22–5.81)
RDW: 17.2 % — ABNORMAL HIGH (ref 11.5–15.5)
WBC: 9.4 10*3/uL (ref 4.0–10.5)

## 2018-02-04 LAB — BASIC METABOLIC PANEL
ANION GAP: 4 — AB (ref 5–15)
BUN: 23 mg/dL — AB (ref 6–20)
CALCIUM: 8 mg/dL — AB (ref 8.9–10.3)
CO2: 23 mmol/L (ref 22–32)
Chloride: 114 mmol/L — ABNORMAL HIGH (ref 101–111)
Creatinine, Ser: 0.73 mg/dL (ref 0.61–1.24)
GFR calc Af Amer: >60 mL/min (ref >60)
GFR calc non Af Amer: >60 mL/min (ref >60)
Glucose, Bld: 100 mg/dL — ABNORMAL HIGH (ref 65–99)
POTASSIUM: 3.3 mmol/L — AB (ref 3.5–5.1)
SODIUM: 141 mmol/L (ref 135–145)

## 2018-02-04 LAB — GLUCOSE, CAPILLARY
GLUCOSE-CAPILLARY: 102 mg/dL — AB (ref 65–99)
GLUCOSE-CAPILLARY: 107 mg/dL — AB (ref 65–99)
GLUCOSE-CAPILLARY: 107 mg/dL — AB (ref 65–99)
Glucose-Capillary: 102 mg/dL — ABNORMAL HIGH (ref 65–99)
Glucose-Capillary: 107 mg/dL — ABNORMAL HIGH (ref 65–99)
Glucose-Capillary: 112 mg/dL — ABNORMAL HIGH (ref 65–99)
Glucose-Capillary: 125 mg/dL — ABNORMAL HIGH (ref 65–99)

## 2018-02-04 LAB — POCT I-STAT 3, ART BLOOD GAS (G3+)
Acid-base deficit: 4 mmol/L — ABNORMAL HIGH (ref 0.0–2.0)
Bicarbonate: 20.5 mmol/L (ref 20.0–28.0)
O2 SAT: 97 %
TCO2: 22 mmol/L (ref 22–32)
pCO2 arterial: 34.8 mmHg (ref 32.0–48.0)
pH, Arterial: 7.379 (ref 7.350–7.450)
pO2, Arterial: 91 mmHg (ref 83.0–108.0)

## 2018-02-04 LAB — TRIGLYCERIDES: TRIGLYCERIDES: 180 mg/dL — AB (ref <150)

## 2018-02-04 NOTE — Progress Notes (Signed)
NEURO HOSPITALIST PROGRESS NOTE   Subjective: Patient  In bed sleeping, NAD, intubated on 12.5mg/kg/min of propofol, intubated and breathing above vent settings.   Exam: Vitals:   02/04/18 0800 02/04/18 0825  BP: (!) 147/70 (!) 147/70  Pulse: 79 78  Resp: (!) 29 (!) 30  Temp:    SpO2: 100% 100%    Physical Exam   HEENT-  Normocephalic, no lesions, without obvious abnormality.  Normal external eye and conjunctiva.  scleral edema present Lungs- no excessive working breathing.  Saturations within normal limits, intubated, breathing above the vent. Extremities- Warm, dry and intact, generalized  Edema present  Musculoskeletal-generalized edema Skin-warm and dry,  Neuro: Propofol 12.560m/kg/min during exam Mental Status: In bed, intubated, sedated, NAD, breathing above vent, opened eyes to command. Cranial Nerves: Pupils, equally round and reactive, sluggish response, Face symmetric Intubated, sedated. Per RN cough and gag intact Motor: Lower extremities withdraws to painful stimuli Tone and bulk:normal tone, flaccid Sensory: upper extremities will grimace to painful stimuli  No withdrawing noted. Deep Tendon Reflexes: 2+ and symmetric throughout Plantars: Not tested: praffo boots in place Cerebellar: Not tested Gait:not tested    Medications:  Scheduled: . aspirin  325 mg Oral Daily  . chlorhexidine gluconate (MEDLINE KIT)  15 mL Mouth Rinse BID  . Chlorhexidine Gluconate Cloth  6 each Topical Daily  . heparin  5,000 Units Subcutaneous Q8H  . irbesartan  300 mg Oral Daily  . LORazepam  2 mg Intravenous Once  . mouth rinse  15 mL Mouth Rinse 10 times per day  . mometasone-formoterol  2 puff Inhalation BID  . multivitamin  15 mL Per Tube Daily  . sodium chloride flush  10-40 mL Intracatheter Q12H  . topiramate  200 mg Oral BID   Continuous: . famotidine (PEPCID) IV 20 mg (02/04/18 0908)  . feeding supplement (VITAL HIGH PROTEIN) 50 mL/hr at  02/04/18 0800  . lacosamide (VIMPAT) IV Stopped (02/03/18 2245)  . levETIRAcetam 1,500 mg (02/04/18 0909)  . phenylephrine (NEO-SYNEPHRINE) Adult infusion Stopped (02/01/18 1207)  . phenytoin (DILANTIN) IV Stopped (02/03/18 2245)  . propofol (DIPRIVAN) infusion 12.5 mcg/kg/min (02/04/18 0835)   PRIOM:BTDHRCBULdocusate, fentaNYL (SUBLIMAZE) injection, hydrALAZINE, ipratropium-albuterol, sodium chloride flush  Pertinent Labs/Diagnostics   MRI Head w/o contrast: IMPRESSION: Subacute to chronic small supra- and infratentorial nonhemorrhagic infarcts.  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 He had an increased in LPDs with stopping sedation and so he was started on topiramate and sedation was resumed. Patient still on propofol this morning 02/04/18. Right-sided discharges appeared to be improving yesterday; LTM EEG was discontinued yesterday (6/2).   At this point, given the semiology resembling Large R MCA stroke prior to initiation of suppression, we would expect him to improve to at least that level.Would give him adequate time to demonstrate improvement. If no improvement, may need to wean AEDs prior to discussions regarding limiting care.   Recommendations: 1) continue keppra 1.5g bid 2) continue dilantin '150mg'$  TID.  3) continue vimpat '150mg'$  BID 4) continue topiramate '200mg'$  BID 5) continue to wean sedation and allow patient to wake up 6) neurology will continue to follow  JeLaurey MoraleMSN, NP-C [facility] [phone] I have discussed the patient with JeLaurey MoraleNP. I have amended the assessment and recommendations  above. Electronically signed: Dr. Kerney Elbe 02/04/2018, 9:10 AM

## 2018-02-04 NOTE — Progress Notes (Signed)
Cortrak Tube Team Note:  Consult received to place a Cortrak feeding tube.   A 10 F Cortrak tube was placed in the RIGHT nare and secured with a nasal bridle at 84 cm. Per the Cortrak monitor reading the tube tip is post-pyloric.   No x-ray is required. RN may begin using tube.   If the tube becomes dislodged please keep the tube and contact the Cortrak team at [email or web address] ([email or web address]) for replacement.  If after hours and replacement cannot be delayed, place a NG tube and confirm placement with an abdominal x-ray.    Gaynell Face, MS, RD, LDN Pager: [phone] Weekend/After Hours: [phone]

## 2018-02-04 NOTE — Telephone Encounter (Signed)
Danny Krause pt is in the hospital so we have to reschedule CT. FYI

## 2018-02-04 NOTE — Progress Notes (Addendum)
PULMONARY / CRITICAL CARE MEDICINE   Name: Danny Krause MRN: [id number] DOB: 08-19-1941    ADMISSION DATE:  01/24/2018 CONSULTATION DATE:  01/24/2018  REFERRING MD:  Maudie Mercury  CHIEF COMPLAINT:  Seizures  BRIEF SUMMARY:   77 y/o M who presented 5/23 after being found unresponsive at home with concern for seizure like activity.  He was intubated in the ER for hypercapnic respiratory failure.  He has a history of CVA with encephalomalacia and seizures (first dx 12/2017), on Keppra with questionable compliance.  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   On the floor 5/27, he developed new left-sided weakness and a CT angiogram was performed which showed no evidence of large vessel occlusion.  Neurology consulted.  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  In light of his extremely poor baseline respiratory function and prior CO2 retention during this admission only 1 mg of Ativan was administered on the floor after which there was still seizure activity on the EEG but the patient was profoundly somnolent.  It was elected elected to transfer him to ICU for intubation to allow deep sedation to burst suppression.  He required as much as 80 mcg of propofol,  And 20 mg/hr Versed for sedation to burst supression and neo-synephrine for BP support.     SUBJECTIVE:  Weaning propofol.   Quickly failed SBT r/t tachypnea with RR 45   VITAL SIGNS: BP (!) 156/77   Pulse 81   Temp 99.4 F (37.4 C) (Axillary)   Resp (!) 26   Ht 5\' 5"  (1.651 m)   Wt 67 kg (147 lb 11.3 oz)   SpO2 100%   BMI 24.58 kg/m   HEMODYNAMICS:    VENTILATOR SETTINGS: Vent Mode: PRVC FiO2 (%):  [30 %] 30 % Set Rate:  [21 bmp] 21 bmp Vt Set:  [500 mL] 500 mL PEEP:  [5 cmH20] 5 cmH20 Plateau Pressure:  [14 cmH20-19 cmH20] 16 cmH20  INTAKE / OUTPUT: I/O last 3 completed shifts: In: 3480.2 [I.V.:367.2; NG/GT:1910; IV Piggyback:1203] Out: 2025 [Urine:2025]  PHYSICAL  EXAMINATION: General:  Chronically ill appearing male, NAD on vent  HEENT: MM pink/moist, ETT Neuro:  Opens eyes, MAE, not following commands, easily agitated  CV: s1s2 rrr, no m/r/g PULM: even/non-labored, lungs bilaterally scattered rhonchi, rare exp wheeze  ZT:IWPY, non-tender, bsx4 active  Extremities: warm/dry, no edema  Skin: no rashes or lesions   LABS:  BMET Recent Labs  Lab 02/02/18 0346 02/03/18 0608 02/04/18 0547  NA 145 142 141  K 3.6 3.5 3.3*  CL 117* 113* 114*  CO2 24 23 23   BUN 26* 22* 23*  CREATININE 0.78 0.75 0.73  GLUCOSE 138* 121* 100*    Electrolytes Recent Labs  Lab 01/31/18 0255  02/02/18 0346 02/03/18 0608 02/04/18 0547  CALCIUM 8.3*   < > 8.1* 8.3* 8.0*  MG 2.1  --  1.9 2.0  --   PHOS  --   --  2.6  --   --    < > = values in this interval not displayed.    CBC Recent Labs  Lab 01/30/18 0322 02/01/18 0400 02/04/18 0547  WBC 7.4 7.3 9.4  HGB 9.7* 9.3* 9.0*  HCT 32.5* 31.4* 30.5*  PLT 233 245 354    Coag's No results for input(s): APTT, INR in the last 168 hours.  Sepsis Markers Recent Labs  Lab 01/31/18 0846 02/01/18 0400 02/02/18 0346  PROCALCITON 0.11 0.10 0.10  ABG Recent Labs  Lab 01/31/18 0335 02/02/18 0933 02/04/18 0235  PHART 7.321* 7.345* 7.379  PCO2ART 38.5 39.5 34.8  PO2ART 119* 98.0 91.0    Liver Enzymes Recent Labs  Lab 01/29/18 0238  AST 17  ALT 14*  ALKPHOS 53  BILITOT 0.7  ALBUMIN 3.1*    Cardiac Enzymes No results for input(s): TROPONINI, PROBNP in the last 168 hours.  Glucose Recent Labs  Lab 02/03/18 1202 02/03/18 1622 02/03/18 1917 02/04/18 0000 02/04/18 0340 02/04/18 0717  GLUCAP 114* 118* 124* 107* 112* 102*    Imaging Mr Brain Wo Contrast  Result Date: 02/04/2018 CLINICAL DATA:  LEFT-sided weakness. History of seizures. Assess encephalopathy. EXAM: MRI HEAD WITHOUT CONTRAST TECHNIQUE: Multiplanar, multiecho pulse sequences of the brain and surrounding structures were  obtained without intravenous contrast. COMPARISON:  CT HEAD Jan 28, 2018 and MRI of the head March 14, 2017 lens FINDINGS: INTRACRANIAL CONTENTS: Patchy reduced diffusion bilateral cerebella, RIGHT occipital lobe and to lesser extent RIGHT parietal lobe with normalized ADC values and T2 shine through. A few scattered chronic micro hemorrhages seen with hypertension. Small area RIGHT frontal lobe encephalomalacia. Ventricles and sulci are normal for patient's age. Prominent basal ganglia perivascular spaces associated with chronic small vessel ischemic disease. A few scattered subcentimeter supratentorial white matter FLAIR T2 hyperintensities compatible with mild chronic small vessel ischemic changes, less than expected for age. No midline shift, mass effect or masses. No abnormal extra-axial fluid collections. Symmetric demonstrate symmetric normal morphology, size and signal. VASCULAR: Normal major intracranial vascular flow voids present at skull base. SKULL AND UPPER CERVICAL SPINE: No abnormal sellar expansion. No suspicious calvarial bone marrow signal. Craniocervical junction maintained. SINUSES/ORBITS: Mild paranasal sinus mucosal thickening. Bilateral mastoid effusions.The included ocular globes and orbital contents are non-suspicious. OTHER: Patient is edentulous.  Life-support lines in place. IMPRESSION: 1. Subacute to chronic small supra- and infratentorial nonhemorrhagic infarcts. 2. Old small RIGHT frontal lobe/MCA territory infarct. Electronically Signed   By: Elon Alas M.D.   On: 02/04/2018 05:29   Dg Chest Port 1 View  Result Date: 02/03/2018 CLINICAL DATA:  Check endotracheal tube placement EXAM: PORTABLE CHEST 1 VIEW COMPARISON:  02/03/2018 FINDINGS: Cardiac shadow is stable. Postsurgical changes are again seen. Left subclavian central line, endotracheal tube and nasogastric catheter are noted in satisfactory position. The endotracheal tube has been advanced and now lies 3.9 cm from the  carina. The lungs are well aerated bilaterally. Stable bibasilar atelectatic changes are noted. No new focal abnormality is seen. IMPRESSION: Recent endotracheal tube advancement. Stable bibasilar atelectasis. Electronically Signed   By: Inez Catalina M.D.   On: 02/03/2018 16:46   Dg Chest Port 1 View  Result Date: 02/03/2018 CLINICAL DATA:  Ventilator dependent respiratory failure. Follow-up bibasilar atelectasis and/or pneumonia. EXAM: PORTABLE CHEST 1 VIEW COMPARISON:  02/02/2018, 02/01/2018 and earlier. FINDINGS: Endotracheal tube tip projects below the thoracic inlet approximately 9 cm above the carina, unchanged. Nasogastric tube courses below the diaphragm into the stomach. LEFT subclavian central venous catheter tip projects over the UPPER SVC, unchanged. Prior sternotomy for CABG and aortic valve replacement. Cardiac silhouette moderately enlarged, unchanged. Thoracic aorta tortuous and atherosclerotic, unchanged. Prominent central pulmonary arteries, unchanged. Interval worsening of the patchy and streaky airspace opacities in the lung bases since yesterday, similar in appearance to the 02/01/2018 examination. No new pulmonary parenchymal abnormalities elsewhere. Pulmonary vascularity normal without evidence of pulmonary edema. IMPRESSION: 1.  Support apparatus satisfactory. 2. Worsening atelectasis and/or pneumonia involving the lung bases BILATERAL. No new  pulmonary parenchymal abnormalities elsewhere. 3. Stable cardiomegaly without evidence of pulmonary edema. Electronically Signed   By: Evangeline Dakin M.D.   On: 02/03/2018 16:23     STUDIES:  EEG showed:  1. Moderate diffuse background slowing 2. Lateralized periodic discharges (LPDs) over the right frontotemporal region - consistent with possible epileptogenic focus. 3. Brief 5-second period of rhythmic theta activity over the right temporal region without evolution CTA Head / Neck 5/27 >> no intracranial or extracranial flow reducing  stenosis or emergent large vessel occlusion  CT Head Perfusion 5/27 >> relative increased cerebral blood flow & cerebral blood volume in the right hemisphere as compared to the left, correlates with suspected status / epileptiform activity EEG 5/29 >> abnormal continuous EEG due to burst suppression with occasional right central LPD's. No ictal evolution.  Pattern consistent with a diffuse cerebral disturbance and a superimposed focal epileptogenic disturbance on the right.  LPD's could be representative of an injury pattern given their persistence despite AED treatment.   CULTURES:   ANTIBIOTICS: Zosyn 5/27 >> 02/01/2018  SIGNIFICANT EVENTS: 5/23  Admit, intubated with concern for seizure 5/24  Extubated  5/24  Neuro rec's continuing dual anticonvulsant therapy until non-compliance can be confirmed. 5/27  On floor, had left sided weakness felt to be Todd seizure, reintubated 5/30  Ongoing concern for seizure, remains suppressed  02/01/2018 heavy sedation for burst supression discontinued  LINES/TUBES: L Roosevelt TLC 5/27 >> ETT 5/27 >>   DISCUSSION: 77 year old with a distant history of a right frontal CVA with encephalomalacia on his CT scan who was treated for status epilepticus with sedation to burst suppression.  He also has a history of O2 dependent chronic lung disease.  He has not yet emerged from deep sedation, and continues to require mechanical ventilation.   ASSESSMENT / PLAN:  NEUROLOGIC A:   Seizure Disorder Hx CVA P:   Neuro following  Continue propofol - goal RASS -1  Continue vimpat, keppra, topamax  Daily WUA -- wean propofol to off as able - discussed with RN  PULMONARY A: O2 Dependent COPD  Rule Out Aspiration  Respiratory Acidosis - corrected  VDRF P: Vent support - 8cc/kg  F/u CXR  F/u ABG Continue BDs  Daily SBT    CARDIOVASCULAR A:  Hypertension  Hx AVR, CAD s/p CABG, multiple stents  Hypotension secondary to sedation P:  Continue home ARB He  has a history of hypertension and he is now back on his usual dose of Avapro.    RENAL A:   Hypokalemia  P:   Trend BMP Electrolytes as needed Avoid nephrotoxins  GASTROINTESTINAL A:   Moderate Nutritional Risk  P:   Feedings per tube   HEMATOLOGIC A:   Anemia - without bleeding, suspect of chronic disease P:  Transfuse per protocol Trend CBC DVT prophylaxis  INFECTIOUS A:   No active issue  P: S/p course zosyn for ?aspiration  Monitor wbc, fever curve off abx    ENDOCRINE No active issue  A:   Monitor glucose on [employer], NP 02/04/2018  10:20 AM Pager: [phone] or [phone]  Attending Note:  77 year old male with CVA history who presents with seizures and Todd's paralysis.  Patient was noted to not be protecting her airway and was intubated and transferred to the ICU.  Patient undergoing burst suppression.  On exam, she is completely unresponsive with clear lungs to auscultation.  I reviewed CXR myself, ETT is in good position.  Discussed with PCCM-NP.  Patient is being seen by neurology and is on multiple seizure medications.  Per notes, will begin titrating sedation to off and allow patient to wake up.  If awake enough will begin weaning trials.  No extubation today however.  The patient is critically ill with multiple organ systems failure and requires high complexity decision making for assessment and support, frequent evaluation and titration of therapies, application of advanced monitoring technologies and extensive interpretation of multiple databases.   Critical Care Time devoted to patient care services described in this note is  32  Minutes. This time reflects time of care of this signee Dr [name]. This critical care time does not reflect procedure time, or teaching time or supervisory time of PA/NP/Med student/Med Resident etc but could involve care discussion time.  Rush Farmer, M.D. [facility] Pulmonary/Critical Care  Medicine. Pager: [phone]. After hours pager: [phone].

## 2018-02-04 NOTE — Telephone Encounter (Signed)
Thank you for letting me know.  Okay to reschedule CT.  Patient needs to follow-up when discharged from the hospital with office as well as complete CT.   Wyn Quaker FNP

## 2018-02-05 ENCOUNTER — Inpatient Hospital Stay (HOSPITAL_COMMUNITY): Payer: Medicare Other

## 2018-02-05 DIAGNOSIS — Z7189 Other specified counseling: Secondary | ICD-10-CM

## 2018-02-05 LAB — CBC
HCT: 31 % — ABNORMAL LOW (ref 39.0–52.0)
Hemoglobin: 9.2 g/dL — ABNORMAL LOW (ref 13.0–17.0)
MCH: 24.6 pg — AB (ref 26.0–34.0)
MCHC: 29.7 g/dL — AB (ref 30.0–36.0)
MCV: 82.9 fL (ref 78.0–100.0)
PLATELETS: 419 10*3/uL — AB (ref 150–400)
RBC: 3.74 MIL/uL — ABNORMAL LOW (ref 4.22–5.81)
RDW: 17.2 % — AB (ref 11.5–15.5)
WBC: 9.7 10*3/uL (ref 4.0–10.5)

## 2018-02-05 LAB — BASIC METABOLIC PANEL
ANION GAP: 8 (ref 5–15)
BUN: 20 mg/dL (ref 6–20)
CALCIUM: 8.1 mg/dL — AB (ref 8.9–10.3)
CO2: 24 mmol/L (ref 22–32)
Chloride: 110 mmol/L (ref 101–111)
Creatinine, Ser: 0.71 mg/dL (ref 0.61–1.24)
GFR calc Af Amer: >60 mL/min (ref >60)
GLUCOSE: 106 mg/dL — AB (ref 65–99)
Potassium: 3.4 mmol/L — ABNORMAL LOW (ref 3.5–5.1)
Sodium: 142 mmol/L (ref 135–145)

## 2018-02-05 LAB — GLUCOSE, CAPILLARY
GLUCOSE-CAPILLARY: 123 mg/dL — AB (ref 65–99)
Glucose-Capillary: 120 mg/dL — ABNORMAL HIGH (ref 65–99)
Glucose-Capillary: 111 mg/dL — ABNORMAL HIGH (ref 65–99)
Glucose-Capillary: 122 mg/dL — ABNORMAL HIGH (ref 65–99)
Glucose-Capillary: 117 mg/dL — ABNORMAL HIGH (ref 65–99)

## 2018-02-05 MED ORDER — POTASSIUM CHLORIDE 20 MEQ/15ML (10%) PO SOLN
40.0000 meq | Freq: Three times a day (TID) | ORAL | Status: AC
  Administered 2018-02-05 (×2): 40 meq
  Filled 2018-02-05 (×2): qty 30

## 2018-02-05 MED ORDER — ETOMIDATE 2 MG/ML IV SOLN
40.0000 mg | Freq: Once | INTRAVENOUS | Status: AC
  Administered 2018-02-06: 20 mg via INTRAVENOUS
  Filled 2018-02-05: qty 20

## 2018-02-05 MED ORDER — MIDAZOLAM HCL 2 MG/2ML IJ SOLN
4.0000 mg | Freq: Once | INTRAMUSCULAR | Status: AC
  Administered 2018-02-06: 2 mg via INTRAVENOUS
  Filled 2018-02-05: qty 4

## 2018-02-05 MED ORDER — VECURONIUM BROMIDE 10 MG IV SOLR
10.0000 mg | Freq: Once | INTRAVENOUS | Status: AC
  Administered 2018-02-06: 10 mg via INTRAVENOUS
  Filled 2018-02-05: qty 10

## 2018-02-05 MED ORDER — PROPOFOL 500 MG/50ML IV EMUL
5.0000 ug/kg/min | Freq: Once | INTRAVENOUS | Status: DC
  Filled 2018-02-05: qty 50

## 2018-02-05 MED ORDER — FENTANYL CITRATE (PF) 100 MCG/2ML IJ SOLN
200.0000 ug | Freq: Once | INTRAMUSCULAR | Status: AC
  Administered 2018-02-06: 100 ug via INTRAVENOUS
  Filled 2018-02-05: qty 4

## 2018-02-05 NOTE — Consult Note (Addendum)
NEURO HOSPITALIST PROGRESS NOTE     Subjective: Patient  In bed sleeping, NAD, intubated on 12.60mg/kg/min of propofol, intubated and breathing above vent settings. Will open eyes to his name, and on command once awake.   Exam: Vitals:   02/05/18 0600 02/05/18 0700  BP: (!) 153/81 (!) 153/74  Pulse: 86 86  Resp: (!) 22 (!) 26  Temp:    SpO2: 100% 100%    Physical Exam   HEENT-  Normocephalic, no lesions, without obvious abnormality.  Normal external eye and conjunctiva.  Scleral edema present bilaterally Cardiovascular- S1-S2 audible,   Lungs-no rhonchi or wheezing noted, no excessive working breathing.  Saturations within normal limits. Vented. Breathing above vent settings Extremities- Warm, dry, edematous Musculoskeletal-edematous, feet in praffo boot Skin-warm and dry, some skin tears noted on bilateral arms, generalized edema present  Neuro:  12.566m/kg/min of propofol Mental Status: In bed, intubated, sedated, NAD, breathing above vent, opened eyes to command. Cranial Nerves:  corneal reflexes present, Pupils, equally round and reactive, sluggish response, Face symmetric Intubated, sedated. Per RN cough and gag intact Motor: Lower extremities withdraw to painful stimuli Flaccid tone.  Sensory: upper extremities - will grimace to painful stimuli  No withdrawal noted. Lower extremities will localize and withdraw to painful stimuli Deep Tendon Reflexes: 2+ and symmetric throughout Plantars: Not tested: praffo boots in place Cerebellar: Not tested Gait:not tested    Medications:  Scheduled: . aspirin  325 mg Oral Daily  . chlorhexidine gluconate (MEDLINE KIT)  15 mL Mouth Rinse BID  . Chlorhexidine Gluconate Cloth  6 each Topical Daily  . heparin  5,000 Units Subcutaneous Q8H  . irbesartan  300 mg Oral Daily  . LORazepam  2 mg Intravenous Once  . mouth rinse  15 mL Mouth Rinse 10 times per day  . mometasone-formoterol  2 puff Inhalation  BID  . multivitamin  15 mL Per Tube Daily  . sodium chloride flush  10-40 mL Intracatheter Q12H  . topiramate  200 mg Oral BID   Continuous: . famotidine (PEPCID) IV Stopped (02/04/18 2224)  . feeding supplement (VITAL HIGH PROTEIN) 50 mL/hr at 02/05/18 0700  . lacosamide (VIMPAT) IV Stopped (02/04/18 2203)  . levETIRAcetam Stopped (02/04/18 2116)  . phenytoin (DILANTIN) IV Stopped (02/04/18 2223)  . 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 He had an increase in LPDs with stopping sedation and so he was started on topiramate and sedation was resumed. Patient still on propofol this morning. no seizure activity noted.  At this point, given the semiology resembling Large R MCA stroke prior to initiation of suppression, we would expect him to improve to at least that level. Would give him adequate time to demonstrate improvement. If no improvement, may need to wean AEDs prior to discussions regarding limiting care.   Recommendations: 1) continue keppra 1.5g bid 2) continue dilantin '150mg'$  TID.  3) continue vimpat '150mg'$  BID 4) continue topiramate '200mg'$  BID 5) continue to wean sedation and allow patient to wake up 6) call neurology for re-examination when sedation discontinued and/or patient extubated.   JeLaurey MoraleMSN, NP-C [facility] [phone] I have discussed the patient with the nurse practitioner as well as Dr. YaNelda Marseillef CCM.  Electronically signed: Dr. Kerney Elbe 02/05/2018, 8:28 AM

## 2018-02-05 NOTE — Care Management Note (Signed)
Case Management Note  Patient Details  Name: COURY GRIEGER MRN: [id number] Date of Birth: 02/20/1941  Subjective/Objective:    Pt admitted on 01/24/18 with seizures and respiratory failure.  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                 Action/Plan: Pt currently remains intubated and has failed weaning attempts.  MD discussing possible tracheostomy vs. One way extubation. Will follow.  Expected Discharge Date:                  Expected Discharge Plan:     In-House Referral:     Discharge planning Services     Post Acute Care Choice:    Choice offered to:     DME Arranged:    DME Agency:     HH Arranged:    HH Agency:     Status of Service:     If discussed at [employer], dates discussed:    Additional Comments:  Reinaldo Raddle, RN, BSN  Trauma/Neuro ICU Case Manager [phone]

## 2018-02-05 NOTE — Plan of Care (Signed)
  Problem: Education: Goal: Knowledge of General Education information will improve Outcome: Progressing   Problem: Clinical Measurements: Goal: Ability to maintain clinical measurements within normal limits will improve Outcome: Progressing Goal: Will remain free from infection Outcome: Progressing Goal: Diagnostic test results will improve Outcome: Progressing Goal: Cardiovascular complication will be avoided Outcome: Progressing   Problem: Nutrition: Goal: Adequate nutrition will be maintained Outcome: Progressing   Problem: Coping: Goal: Level of anxiety will decrease Outcome: Progressing   Problem: Elimination: Goal: Will not experience complications related to bowel motility Outcome: Progressing Goal: Will not experience complications related to urinary retention Outcome: Progressing   Problem: Skin Integrity: Goal: Risk for impaired skin integrity will decrease Outcome: Progressing   Problem: Education: Goal: Expressions of having a comfortable level of knowledge regarding the disease process will increase Outcome: Progressing   Problem: Coping: Goal: Ability to adjust to condition or change in health will improve Outcome: Progressing Goal: Ability to identify appropriate support needs will improve Outcome: Progressing   Problem: Health Behavior/Discharge Planning: Goal: Compliance with prescribed medication regimen will improve Outcome: Progressing   Problem: Medication: Goal: Risk for medication side effects will decrease Outcome: Progressing   Problem: Clinical Measurements: Goal: Complications related to the disease process, condition or treatment will be avoided or minimized Outcome: Progressing Goal: Diagnostic test results will improve Outcome: Progressing   Problem: Safety: Goal: Verbalization of understanding the information provided will improve Outcome: Progressing   Problem: Self-Concept: Goal: Level of anxiety will decrease Outcome:  Progressing Goal: Ability to verbalize feelings about condition will improve Outcome: Progressing   Problem: Respiratory: Goal: Ability to maintain a clear airway and adequate ventilation will improve Outcome: Progressing   Problem: Role Relationship: Goal: Method of communication will improve Outcome: Progressing

## 2018-02-05 NOTE — Clinical Social Work Note (Signed)
Clinical Social Worker continuing to follow patient and family for support.  Patient family had a meeting with RN and MD today regarding patient plans moving forward - comfort care vs. Trach/PEG.  CSW to await further documentation to determine patient plans moving forward.  CSW remains available for support as needed.  Barbette Or, [place]

## 2018-02-05 NOTE — Progress Notes (Signed)
PULMONARY / CRITICAL CARE MEDICINE   Name: Danny Krause MRN: [id number] DOB: Apr 28, 1941    ADMISSION DATE:  01/24/2018 CONSULTATION DATE:  01/24/2018  REFERRING MD:  Maudie Mercury  CHIEF COMPLAINT:  Seizures  BRIEF SUMMARY:   77 y/o M who presented 5/23 after being found unresponsive at home with concern for seizure like activity.  He was intubated in the ER for hypercapnic respiratory failure.  He has a history of CVA with encephalomalacia and seizures (first dx 12/2017), on Keppra with questionable compliance.  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   On the floor 5/27, he developed new left-sided weakness and a CT angiogram was performed which showed no evidence of large vessel occlusion.  Neurology consulted.  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  In light of his extremely poor baseline respiratory function and prior CO2 retention during this admission only 1 mg of Ativan was administered on the floor after which there was still seizure activity on the EEG but the patient was profoundly somnolent.  It was elected elected to transfer him to ICU for intubation to allow deep sedation to burst suppression.  He required as much as 80 mcg of propofol,  And 20 mg/hr Versed for sedation to burst supression and neo-synephrine for BP support.    SUBJECTIVE:  Failed weaning this AM, remains unresponsive, no events and no new complaints  VITAL SIGNS: BP (!) 154/75 (BP Location: Right Arm)   Pulse 91   Temp 100 F (37.8 C) (Axillary)   Resp (!) 25   Ht 5\' 5"  (1.651 m)   Wt 147 lb 11.3 oz (67 kg)   SpO2 100%   BMI 24.58 kg/m   HEMODYNAMICS:    VENTILATOR SETTINGS: Vent Mode: PRVC FiO2 (%):  [30 %-40 %] 40 % Set Rate:  [21 bmp] 21 bmp Vt Set:  [500 mL] 500 mL PEEP:  [5 cmH20] 5 cmH20 Plateau Pressure:  [12 cmH20-18 cmH20] 15 cmH20  INTAKE / OUTPUT: I/O last 3 completed shifts: In: 3214.6 [I.V.:318.8; NG/GT:1600.8; IV  Piggyback:1295] Out: 2035 [Urine:2035]  PHYSICAL EXAMINATION: General:  Chronically ill appearing male, unresponsive, NAD HEENT: [place]/AT, PERRL, EOM-I and MMM Neuro:  Opens eyes but not following commands off propofol CV: RRR, Nl S1/S2 and -M/R/G PULM: Coarse BS diffusely GI: Soft, NT, ND and +BS Extremities: -edema and -tenderness Skin: no rashes or lesions  LABS:  BMET Recent Labs  Lab 02/03/18 0608 02/04/18 0547 02/05/18 0500  NA 142 141 142  K 3.5 3.3* 3.4*  CL 113* 114* 110  CO2 23 23 24   BUN 22* 23* 20  CREATININE 0.75 0.73 0.71  GLUCOSE 121* 100* 106*    Electrolytes Recent Labs  Lab 01/31/18 0255  02/02/18 0346 02/03/18 0608 02/04/18 0547 02/05/18 0500  CALCIUM 8.3*   < > 8.1* 8.3* 8.0* 8.1*  MG 2.1  --  1.9 2.0  --   --   PHOS  --   --  2.6  --   --   --    < > = values in this interval not displayed.    CBC Recent Labs  Lab 02/01/18 0400 02/04/18 0547 02/05/18 0500  WBC 7.3 9.4 9.7  HGB 9.3* 9.0* 9.2*  HCT 31.4* 30.5* 31.0*  PLT 245 354 419*   Coag's No results for input(s): APTT, INR in the last 168 hours.  Sepsis Markers Recent Labs  Lab 01/31/18 0846 02/01/18 0400 02/02/18 0346  PROCALCITON  0.11 0.10 0.10   ABG Recent Labs  Lab 01/31/18 0335 02/02/18 0933 02/04/18 0235  PHART 7.321* 7.345* 7.379  PCO2ART 38.5 39.5 34.8  PO2ART 119* 98.0 91.0   Liver Enzymes No results for input(s): AST, ALT, ALKPHOS, BILITOT, ALBUMIN in the last 168 hours.  Cardiac Enzymes No results for input(s): TROPONINI, PROBNP in the last 168 hours.  Glucose Recent Labs  Lab 02/04/18 1137 02/04/18 1554 02/04/18 2011 02/04/18 2353 02/05/18 0310 02/05/18 0823  GLUCAP 107* 107* 102* 125* 120* 111*   Imaging Dg Chest Portable 1 View  Result Date: 02/05/2018 CLINICAL DATA:  Respiratory failure EXAM: PORTABLE CHEST 1 VIEW COMPARISON:  02/03/2018 FINDINGS: Cardiac shadow is mildly enlarged but stable. Feeding catheter, endotracheal tube and left  subclavian central line are again seen in satisfactory position. The nasogastric catheter has been removed. The lungs are well aerated bilaterally with very mild chronic interstitial changes. No acute infiltrate or sizable effusion is noted. Previously seen basilar atelectasis has cleared. IMPRESSION: Tubes and lines as described above. No acute abnormality noted. Electronically Signed   By: Inez Catalina M.D.   On: 02/05/2018 07:49   STUDIES:  EEG showed:  1. Moderate diffuse background slowing 2. Lateralized periodic discharges (LPDs) over the right frontotemporal region - consistent with possible epileptogenic focus. 3. Brief 5-second period of rhythmic theta activity over the right temporal region without evolution CTA Head / Neck 5/27 >> no intracranial or extracranial flow reducing stenosis or emergent large vessel occlusion  CT Head Perfusion 5/27 >> relative increased cerebral blood flow & cerebral blood volume in the right hemisphere as compared to the left, correlates with suspected status / epileptiform activity EEG 5/29 >> abnormal continuous EEG due to burst suppression with occasional right central LPD's. No ictal evolution.  Pattern consistent with a diffuse cerebral disturbance and a superimposed focal epileptogenic disturbance on the right.  LPD's could be representative of an injury pattern given their persistence despite AED treatment.   CULTURES: None  ANTIBIOTICS: Zosyn 5/27 >> 02/01/2018  SIGNIFICANT EVENTS: 5/23  Admit, intubated with concern for seizure 5/24  Extubated  5/24  Neuro rec's continuing dual anticonvulsant therapy until non-compliance can be confirmed. 5/27  On floor, had left sided weakness felt to be Todd seizure, reintubated 5/30  Ongoing concern for seizure, remains suppressed  02/01/2018 heavy sedation for burst supression discontinued 6/4 not sedated, opens eyes, not following commands  LINES/TUBES: L [place] TLC 5/27 >> ETT 5/27 >>    DISCUSSION: 77 year old with a distant history of a right frontal CVA with encephalomalacia on his CT scan who was treated for status epilepticus with sedation to burst suppression.  He also has a history of O2 dependent chronic lung disease.  He has not yet emerged from deep sedation, and continues to require mechanical ventilation.  ASSESSMENT / PLAN:  NEUROLOGIC A:   Seizure Disorder Hx CVA P:   Neuro following - Spoke with neuro, feeling is that this likely the new baseline D/C propofol Continue vimpat, keppra, topamax  Daily WUA -- wean propofol to off as able - discussed with RN  PULMONARY A: O2 Dependent COPD  Rule Out Aspiration  Respiratory Acidosis - corrected  VDRF P: Begin PS trials but no extubation given mental status F/u CXR  F/u ABG Continue BDs  Daily SBT  Family to arrive at noon today and will discuss plan of care  CARDIOVASCULAR A:  Hypertension  Hx AVR, CAD s/p CABG, multiple stents  Hypotension secondary to sedation P:  Continue home ARB He has a history of hypertension and he is now back on his usual dose of Avapro.  Tele monitoring  RENAL A:   Hypokalemia  P:   Trend BMP Replace electrolytes as indicated Avoid nephrotoxins  GASTROINTESTINAL A:   Moderate Nutritional Risk  P:   Feedings per tube GI prophylaxis  HEMATOLOGIC A:   Anemia - without bleeding, suspect of chronic disease P: Transfuse per protocol Trend CBC DVT prophylaxis  INFECTIOUS A:   No active issue  P: S/p course zosyn for ?aspiration  Monitor wbc, fever curve off abx   ENDOCRINE No active issue  A:   Monitor glucose on chem   Family to arrive today at noon for discussion regarding one way extubation vs trach/peg.  The patient is critically ill with multiple organ systems failure and requires high complexity decision making for assessment and support, frequent evaluation and titration of therapies, application of advanced monitoring technologies and  extensive interpretation of multiple databases.   Critical Care Time devoted to patient care services described in this note is  32  Minutes. This time reflects time of care of this signee Dr [name]. This critical care time does not reflect procedure time, or teaching time or supervisory time of PA/NP/Med student/Med Resident etc but could involve care discussion time.  Rush Farmer, M.D. [facility] Pulmonary/Critical Care Medicine. Pager: [phone]. After hours pager: [phone].

## 2018-02-05 NOTE — Telephone Encounter (Signed)
Pt is still in the hospital as of today. Will hold to keep checking when pt is discharged.

## 2018-02-06 ENCOUNTER — Inpatient Hospital Stay (HOSPITAL_COMMUNITY): Payer: Medicare Other

## 2018-02-06 DIAGNOSIS — J9601 Acute respiratory failure with hypoxia: Secondary | ICD-10-CM | POA: Diagnosis present

## 2018-02-06 DIAGNOSIS — I10 Essential (primary) hypertension: Secondary | ICD-10-CM

## 2018-02-06 LAB — MAGNESIUM: Magnesium: 2 mg/dL (ref 1.7–2.4)

## 2018-02-06 LAB — GLUCOSE, CAPILLARY
GLUCOSE-CAPILLARY: 96 mg/dL (ref 65–99)
GLUCOSE-CAPILLARY: 117 mg/dL — AB (ref 65–99)
Glucose-Capillary: 102 mg/dL — ABNORMAL HIGH (ref 65–99)
Glucose-Capillary: 117 mg/dL — ABNORMAL HIGH (ref 65–99)
Glucose-Capillary: 88 mg/dL (ref 65–99)
Glucose-Capillary: 95 mg/dL (ref 65–99)

## 2018-02-06 LAB — BLOOD GAS, ARTERIAL
ACID-BASE DEFICIT: 3 mmol/L — AB (ref 0.0–2.0)
BICARBONATE: 21.3 mmol/L (ref 20.0–28.0)
Drawn by: 441351
FIO2: 40
LHR: 21 {breaths}/min
MECHVT: 500 mL
O2 SAT: 97.9 %
PEEP/CPAP: 5 cmH2O
PH ART: 7.381 (ref 7.350–7.450)
Patient temperature: 98.6
pCO2 arterial: 36.7 mmHg (ref 32.0–48.0)
pO2, Arterial: 116 mmHg — ABNORMAL HIGH (ref 83.0–108.0)

## 2018-02-06 LAB — URINALYSIS, ROUTINE W REFLEX MICROSCOPIC
BILIRUBIN URINE: NEGATIVE
GLUCOSE, UA: NEGATIVE mg/dL
Hgb urine dipstick: NEGATIVE
KETONES UR: NEGATIVE mg/dL
Leukocytes, UA: NEGATIVE
Nitrite: NEGATIVE
PH: 6 (ref 5.0–8.0)
Protein, ur: NEGATIVE mg/dL
Specific Gravity, Urine: 1.014 (ref 1.005–1.030)

## 2018-02-06 LAB — BASIC METABOLIC PANEL
ANION GAP: 3 — AB (ref 5–15)
BUN: 19 mg/dL (ref 6–20)
CALCIUM: 8.2 mg/dL — AB (ref 8.9–10.3)
CO2: 22 mmol/L (ref 22–32)
Chloride: 115 mmol/L — ABNORMAL HIGH (ref 101–111)
Creatinine, Ser: 0.72 mg/dL (ref 0.61–1.24)
GFR calc Af Amer: >60 mL/min (ref >60)
GLUCOSE: 109 mg/dL — AB (ref 65–99)
Potassium: 3.9 mmol/L (ref 3.5–5.1)
Sodium: 140 mmol/L (ref 135–145)

## 2018-02-06 LAB — CBC
HEMATOCRIT: 30.4 % — AB (ref 39.0–52.0)
Hemoglobin: 9 g/dL — ABNORMAL LOW (ref 13.0–17.0)
MCH: 24.7 pg — ABNORMAL LOW (ref 26.0–34.0)
MCHC: 29.6 g/dL — ABNORMAL LOW (ref 30.0–36.0)
MCV: 83.3 fL (ref 78.0–100.0)
Platelets: 444 10*3/uL — ABNORMAL HIGH (ref 150–400)
RBC: 3.65 MIL/uL — AB (ref 4.22–5.81)
RDW: 17.2 % — AB (ref 11.5–15.5)
WBC: 9.6 10*3/uL (ref 4.0–10.5)

## 2018-02-06 LAB — PHOSPHORUS: PHOSPHORUS: 2.9 mg/dL (ref 2.5–4.6)

## 2018-02-06 LAB — PROTIME-INR
INR: 1.05
Prothrombin Time: 13.7 seconds (ref 11.4–15.2)

## 2018-02-06 MED ORDER — DEXMEDETOMIDINE HCL IN NACL 200 MCG/50ML IV SOLN
0.0000 ug/kg/h | INTRAVENOUS | Status: DC
  Administered 2018-02-06: 0.4 ug/kg/h via INTRAVENOUS
  Filled 2018-02-06: qty 50

## 2018-02-06 MED ORDER — DIPHENHYDRAMINE HCL 50 MG/ML IJ SOLN
12.5000 mg | Freq: Four times a day (QID) | INTRAMUSCULAR | Status: DC | PRN
  Administered 2018-02-07: 12.5 mg via INTRAVENOUS
  Filled 2018-02-06: qty 1

## 2018-02-06 MED ORDER — DEXMEDETOMIDINE HCL IN NACL 400 MCG/100ML IV SOLN
0.4000 ug/kg/h | INTRAVENOUS | Status: DC
  Administered 2018-02-06: 0.2 ug/kg/h via INTRAVENOUS
  Filled 2018-02-06: qty 100

## 2018-02-06 MED ORDER — FAMOTIDINE 40 MG/5ML PO SUSR
20.0000 mg | Freq: Two times a day (BID) | ORAL | Status: DC
  Administered 2018-02-06 – 2018-02-10 (×8): 20 mg
  Filled 2018-02-06 (×11): qty 2.5

## 2018-02-06 MED ORDER — IRBESARTAN 150 MG PO TABS
300.0000 mg | ORAL_TABLET | Freq: Every day | ORAL | Status: DC
  Administered 2018-02-06 – 2018-02-11 (×5): 300 mg
  Filled 2018-02-06 (×4): qty 2

## 2018-02-06 MED ORDER — TOPIRAMATE 100 MG PO TABS
200.0000 mg | ORAL_TABLET | Freq: Two times a day (BID) | ORAL | Status: DC
  Administered 2018-02-06 – 2018-02-10 (×10): 200 mg
  Filled 2018-02-06 (×9): qty 2

## 2018-02-06 MED ORDER — BETHANECHOL CHLORIDE 10 MG PO TABS
5.0000 mg | ORAL_TABLET | Freq: Three times a day (TID) | ORAL | Status: DC
  Administered 2018-02-06 – 2018-02-11 (×13): 5 mg
  Filled 2018-02-06 (×2): qty 1
  Filled 2018-02-06: qty 0.5
  Filled 2018-02-06 (×11): qty 1

## 2018-02-06 MED ORDER — ASPIRIN 325 MG PO TABS
325.0000 mg | ORAL_TABLET | Freq: Every day | ORAL | Status: DC
  Administered 2018-02-07 – 2018-02-11 (×4): 325 mg
  Filled 2018-02-06 (×4): qty 1

## 2018-02-06 MED ORDER — PROPOFOL 10 MG/ML IV BOLUS
100.0000 mg | Freq: Once | INTRAVENOUS | Status: AC
  Administered 2018-02-06: 100 mg via INTRAVENOUS

## 2018-02-06 NOTE — Telephone Encounter (Signed)
Pt still admitted as of today.

## 2018-02-06 NOTE — Procedures (Signed)
Bedside Tracheostomy Insertion Procedure Note   Patient Details:   Name: Danny Krause DOB: 08/17/1941 MRN: [id number]  Procedure: Tracheostomy  Pre Procedure Assessment: ET Tube Size: 7.5 ET Tube secured at lip (cm): 22 Bite block in place: Yes Breath Sounds: Clear  Post Procedure Assessment: BP (!) 168/87   Pulse 84   Temp 97.8 F (36.6 C) (Axillary)   Resp (!) 25   Ht 5\' 5"  (1.651 m)   Wt 147 lb 11.3 oz (67 kg)   SpO2 100%   BMI 24.58 kg/m  O2 sats: stable throughout Complications: No apparent complications Patient did tolerate procedure well Tracheostomy Brand:Shiley Tracheostomy Style:Cuffed Tracheostomy Size: 6.0 Tracheostomy Secured GGE:[id number] Tracheostomy Placement Confirmation:Trach cuff visualized and in place and Chest X ray ordered for placement    Mickie Hillier 02/06/2018, 12:45 PM

## 2018-02-06 NOTE — Progress Notes (Signed)
Transported pt w/ another RN and RT to CT on ventilator and cardiac monitor. No issues. Returned to room.

## 2018-02-06 NOTE — Procedures (Signed)
Percutaneous Tracheostomy Placement  Consent from family.  Patient sedated, paralyzed and position.  Placed on 100% FiO2 and RR matched.  Area cleaned and draped.  Lidocaine/epi injected.  Skin incision done followed by blunt dissection.  Trachea palpated then punctured, catheter passed and visualized bronchoscopically.  Wire placed and visualized.  Catheter removed.  Airway then entered and dilated.  Size 6 cuffed shiley trach placed and visualized bronchoscopically well above carina.  Good volume returns.  Patient tolerated the procedure well without complications.  Minimal blood loss.  CXR ordered and pending.  Wesam G. Yacoub, M.D. [facility] Pulmonary/Critical Care Medicine. Pager: [phone]. After hours pager: [phone].  

## 2018-02-06 NOTE — Progress Notes (Signed)
Pt voided 500cc urine. Post-void residual bladder scan volume 252cc. Paged Zara Council, NP - verbal order to put in foley.

## 2018-02-06 NOTE — Procedures (Signed)
Bronchoscopy Procedure Note Danny Krause [id number] [birth year]  Procedure: Bronchoscopy Indications: Diagnostic evaluation of the airways  Procedure Details Consent: Risks of procedure as well as the alternatives and risks of each were explained to the (patient/caregiver).  Consent for procedure obtained. Time Out: Verified patient identification, verified procedure, site/side was marked, verified correct patient position, special equipment/implants available, medications/allergies/relevent history reviewed, required imaging and test results available.  Performed  In preparation for procedure, patient was given 100% FiO2 and bronchoscope lubricated. Sedation: Benzodiazepines, Muscle relaxants, Etomidate and Fentanyl  Airway entered and the following bronchi were examined: RUL, RML, RLL, LUL, LLL and Bronchi.   Bronchoscope removed.  , Patient placed back on 100% FiO2 at conclusion of procedure.    Evaluation Hemodynamic Status: BP stable throughout; O2 sats: stable throughout Patient's Current Condition: stable Specimens:  None Complications: No apparent complications Patient did tolerate procedure well.   Danny Krause 02/06/2018

## 2018-02-06 NOTE — Progress Notes (Signed)
Pt transported to CT on servo I, NARD VSS, airway patent and secure. Will cont to monitor.

## 2018-02-06 NOTE — Progress Notes (Signed)
PULMONARY / CRITICAL CARE MEDICINE   Name: Danny Krause MRN: [id number] DOB: [birth year]    ADMISSION DATE:  01/24/2018 CONSULTATION DATE:  01/24/2018  REFERRING MD:  Maudie Mercury  CHIEF COMPLAINT:  Seizures  BRIEF SUMMARY:   77 y/o M who presented 5/23 after being found unresponsive at home with concern for seizure like activity.  He was intubated in the ER for hypercapnic respiratory failure.  He has a history of CVA with encephalomalacia and seizures (first dx 12/2017), on Keppra with questionable compliance.  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   On the floor 5/27, he developed new left-sided weakness and a CT angiogram was performed which showed no evidence of large vessel occlusion.  Neurology consulted.  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  In light of his extremely poor baseline respiratory function and prior CO2 retention during this admission only 1 mg of Ativan was administered on the floor after which there was still seizure activity on the EEG but the patient was profoundly somnolent.  It was elected elected to transfer him to ICU for intubation to allow deep sedation to burst suppression.  He required as much as 80 mcg of propofol,  And 20 mg/hr Versed for sedation to burst supression and neo-synephrine for BP support.    SUBJECTIVE:  No events overnight, no new complaints  VITAL SIGNS: BP (!) 145/66   Pulse 84   Temp 98.6 F (37 C) (Axillary)   Resp (!) 25   Ht 5\' 5"  (1.651 m)   Wt 147 lb 11.3 oz (67 kg)   SpO2 100%   BMI 24.58 kg/m   HEMODYNAMICS:    VENTILATOR SETTINGS: Vent Mode: PRVC FiO2 (%):  [40 %] 40 % Set Rate:  [21 bmp] 21 bmp Vt Set:  [500 mL] 500 mL PEEP:  [5 cmH20] 5 cmH20 Pressure Support:  [5 cmH20] 5 cmH20 Plateau Pressure:  [10 cmH20-23 cmH20] 23 cmH20  INTAKE / OUTPUT: I/O last 3 completed shifts: In: 2517.9 [I.V.:69.2; NG/GT:1301.7; IV Piggyback:1147] Out: 2950  [Urine:2950]  PHYSICAL EXAMINATION: General:  Chronically ill appearing, NAD, unresponsive HEENT: [place]/AT, PERRL, EOM-I and MMM Neuro:  Opens eyes but not following any commands CV: RRR, Nl S1/S2 and -M/R/G PULM: Coarse BS diffusely GI: Soft, NT, ND and +BS Extremities: -edema and -tenderness Skin: no rashes or lesions  LABS:  BMET Recent Labs  Lab 02/04/18 0547 02/05/18 0500 02/06/18 0322  NA 141 142 140  K 3.3* 3.4* 3.9  CL 114* 110 115*  CO2 23 24 22   BUN 23* 20 19  CREATININE 0.73 0.71 0.72  GLUCOSE 100* 106* 109*   Electrolytes Recent Labs  Lab 02/02/18 0346 02/03/18 0608 02/04/18 0547 02/05/18 0500 02/06/18 0322  CALCIUM 8.1* 8.3* 8.0* 8.1* 8.2*  MG 1.9 2.0  --   --  2.0  PHOS 2.6  --   --   --  2.9   CBC Recent Labs  Lab 02/04/18 0547 02/05/18 0500 02/06/18 0322  WBC 9.4 9.7 9.6  HGB 9.0* 9.2* 9.0*  HCT 30.5* 31.0* 30.4*  PLT 354 419* 444*   Coag's Recent Labs  Lab 02/06/18 0322  INR 1.05   Sepsis Markers Recent Labs  Lab 01/31/18 0846 02/01/18 0400 02/02/18 0346  PROCALCITON 0.11 0.10 0.10   ABG Recent Labs  Lab 02/02/18 0933 02/04/18 0235 02/06/18 0408  PHART 7.345* 7.379 7.381  PCO2ART 39.5 34.8 36.7  PO2ART 98.0 91.0 116*  Liver Enzymes No results for input(s): AST, ALT, ALKPHOS, BILITOT, ALBUMIN in the last 168 hours.  Cardiac Enzymes No results for input(s): TROPONINI, PROBNP in the last 168 hours.  Glucose Recent Labs  Lab 02/05/18 1157 02/05/18 1610 02/05/18 2009 02/06/18 0000 02/06/18 0344 02/06/18 0734  GLUCAP 122* 123* 117* 88 95 96   Imaging Dg Chest Port 1 View  Result Date: 02/06/2018 CLINICAL DATA:  Endotracheal tube present. Acute respiratory failure with hypercapnia. Postictal coma. EXAM: PORTABLE CHEST 1 VIEW COMPARISON:  02/05/2018 FINDINGS: Endotracheal tube, left subclavian central venous catheter, and feeding tube remain in appropriate position. Prior CABG again noted. Heart size is stable. New  mild opacity is seen at the right lung base, which may be due to atelectasis or infiltrate. Left lung remains clear. No pneumothorax or pleural effusion visualized. IMPRESSION: New mild right atelectasis versus infiltrate. Electronically Signed   By: Earle Gell M.D.   On: 02/06/2018 09:35   STUDIES:  EEG showed:  1. Moderate diffuse background slowing 2. Lateralized periodic discharges (LPDs) over the right frontotemporal region - consistent with possible epileptogenic focus. 3. Brief 5-second period of rhythmic theta activity over the right temporal region without evolution CTA Head / Neck 5/27 >> no intracranial or extracranial flow reducing stenosis or emergent large vessel occlusion  CT Head Perfusion 5/27 >> relative increased cerebral blood flow & cerebral blood volume in the right hemisphere as compared to the left, correlates with suspected status / epileptiform activity EEG 5/29 >> abnormal continuous EEG due to burst suppression with occasional right central LPD's. No ictal evolution.  Pattern consistent with a diffuse cerebral disturbance and a superimposed focal epileptogenic disturbance on the right.  LPD's could be representative of an injury pattern given their persistence despite AED treatment.   CULTURES: None  ANTIBIOTICS: Zosyn 5/27 >> 02/01/2018  SIGNIFICANT EVENTS: 5/23  Admit, intubated with concern for seizure 5/24  Extubated  5/24  Neuro rec's continuing dual anticonvulsant therapy until non-compliance can be confirmed. 5/27  On floor, had left sided weakness felt to be Todd seizure, reintubated 5/30  Ongoing concern for seizure, remains suppressed  02/01/2018 heavy sedation for burst supression discontinued 6/4 not sedated, opens eyes, not following commands  LINES/TUBES: L [place] TLC 5/27 >> ETT 5/27 >>   DISCUSSION: 77 year old with a distant history of a right frontal CVA with encephalomalacia on his CT scan who was treated for status epilepticus with sedation to  burst suppression.  He also has a history of O2 dependent chronic lung disease.  He has not yet emerged from deep sedation, and continues to require mechanical ventilation.  ASSESSMENT / PLAN:  NEUROLOGIC A:   Seizure Disorder Hx CVA P:   Neuro following - Spoke with neuro, feeling is that this likely the new baseline D/C all sedation except for trach material Continue vimpat, keppra, topamax  Daily WUA -- wean propofol to off as able - discussed with RN  PULMONARY A: O2 Dependent COPD  Rule Out Aspiration  Respiratory Acidosis - corrected  VDRF P: Trach today F/u CXR  F/u ABG Continue BDs  Begin SBT once patient is trached and awake with paralytics off  CARDIOVASCULAR A:  Hypertension  Hx AVR, CAD s/p CABG, multiple stents  Hypotension secondary to sedation P:  Continue home ARB He has a history of hypertension and he is now back on his usual dose of Avapro.  Tele monitoring  RENAL A:   Hypokalemia  P:   Trend BMP Replace electrolytes as  indicated Avoid nephrotoxins  GASTROINTESTINAL A:   Moderate Nutritional Risk  P:   Hold TF for trach today IR to place PEG, consult placed GI prophylaxis  HEMATOLOGIC A:   Anemia - without bleeding, suspect of chronic disease P: Transfuse per protocol Trend CBC DVT prophylaxis  INFECTIOUS A:   No active issue  P: S/p course zosyn for ?aspiration  Monitor wbc, fever curve off abx   ENDOCRINE No active issue  A:   Monitor glucose on chem   Spoke with family, believe trach/peg is what the patient would want.  Will proceed with trach today and ask IR to place PEG.  [facility]  The patient is critically ill with multiple organ systems failure and requires high complexity decision making for assessment and support, frequent evaluation and titration of therapies, application of advanced monitoring technologies and extensive interpretation of multiple databases.   Critical Care Time devoted to patient care services  described in this note is  33  Minutes. This time reflects time of care of this signee Dr [name]. This critical care time does not reflect procedure time, or teaching time or supervisory time of PA/NP/Med student/Med Resident etc but could involve care discussion time.  Rush Farmer, M.D. [facility] Pulmonary/Critical Care Medicine. Pager: [phone]. After hours pager: [phone].

## 2018-02-06 NOTE — Telephone Encounter (Signed)
Per chart- pt is still admitted. Will hold message to address after pt is discharged.

## 2018-02-06 NOTE — Progress Notes (Signed)
[place] Progress Note Patient Name: Danny Krause DOB: 11/02/1940 MRN: [id number]   Date of Service  02/06/2018  HPI/Events of Note  Rash on his trunk but sparing his extremities. No raised lesion. Does not appear to be progressing over the course of the shift. Nurse not sure how long its been there.  eICU Interventions  Benadryl 12.5 mg iv Q 6 Hrs PRN, Observe closely for worsening and notify Elink MD        Kerry Kass Joleigh Mineau 02/06/2018, 11:40 PM

## 2018-02-06 NOTE — Plan of Care (Signed)
Pt retaining urine and required foley placement.

## 2018-02-07 ENCOUNTER — Other Ambulatory Visit: Payer: Medicare Other

## 2018-02-07 DIAGNOSIS — J189 Pneumonia, unspecified organism: Secondary | ICD-10-CM

## 2018-02-07 DIAGNOSIS — Z930 Tracheostomy status: Secondary | ICD-10-CM

## 2018-02-07 LAB — GLUCOSE, CAPILLARY
GLUCOSE-CAPILLARY: 134 mg/dL — AB (ref 65–99)
GLUCOSE-CAPILLARY: 164 mg/dL — AB (ref 65–99)
GLUCOSE-CAPILLARY: 123 mg/dL — AB (ref 65–99)
Glucose-Capillary: 102 mg/dL — ABNORMAL HIGH (ref 65–99)
Glucose-Capillary: 110 mg/dL — ABNORMAL HIGH (ref 65–99)
Glucose-Capillary: 124 mg/dL — ABNORMAL HIGH (ref 65–99)
Glucose-Capillary: 127 mg/dL — ABNORMAL HIGH (ref 65–99)

## 2018-02-07 LAB — HEMOGLOBIN A1C
Hgb A1c MFr Bld: 5.9 % — ABNORMAL HIGH (ref 4.8–5.6)
MEAN PLASMA GLUCOSE: 122.63 mg/dL

## 2018-02-07 LAB — CBC
HCT: 30.3 % — ABNORMAL LOW (ref 39.0–52.0)
HEMOGLOBIN: 9.1 g/dL — AB (ref 13.0–17.0)
MCH: 25 pg — ABNORMAL LOW (ref 26.0–34.0)
MCHC: 30 g/dL (ref 30.0–36.0)
MCV: 83.2 fL (ref 78.0–100.0)
PLATELETS: 457 10*3/uL — AB (ref 150–400)
RBC: 3.64 MIL/uL — ABNORMAL LOW (ref 4.22–5.81)
RDW: 16.9 % — AB (ref 11.5–15.5)
WBC: 10.6 10*3/uL — ABNORMAL HIGH (ref 4.0–10.5)

## 2018-02-07 LAB — BASIC METABOLIC PANEL
Anion gap: 8 (ref 5–15)
BUN: 20 mg/dL (ref 6–20)
CALCIUM: 8.3 mg/dL — AB (ref 8.9–10.3)
CHLORIDE: 109 mmol/L (ref 101–111)
CO2: 22 mmol/L (ref 22–32)
CREATININE: 0.75 mg/dL (ref 0.61–1.24)
Glucose, Bld: 128 mg/dL — ABNORMAL HIGH (ref 65–99)
Potassium: 3.4 mmol/L — ABNORMAL LOW (ref 3.5–5.1)
SODIUM: 139 mmol/L (ref 135–145)

## 2018-02-07 LAB — TRIGLYCERIDES: TRIGLYCERIDES: 183 mg/dL — AB (ref <150)

## 2018-02-07 LAB — PHOSPHORUS: PHOSPHORUS: 3.5 mg/dL (ref 2.5–4.6)

## 2018-02-07 LAB — MAGNESIUM: MAGNESIUM: 1.9 mg/dL (ref 1.7–2.4)

## 2018-02-07 MED ORDER — POTASSIUM CHLORIDE 20 MEQ/15ML (10%) PO SOLN
20.0000 meq | ORAL | Status: AC
  Administered 2018-02-07 (×2): 20 meq
  Filled 2018-02-07 (×2): qty 15

## 2018-02-07 MED ORDER — ACETAMINOPHEN 160 MG/5ML PO SOLN
650.0000 mg | Freq: Four times a day (QID) | ORAL | Status: DC | PRN
  Administered 2018-02-07 – 2018-02-09 (×3): 650 mg
  Filled 2018-02-07 (×3): qty 20.3

## 2018-02-07 MED ORDER — DEXMEDETOMIDINE HCL IN NACL 400 MCG/100ML IV SOLN
0.4000 ug/kg/h | INTRAVENOUS | Status: DC
  Administered 2018-02-07 (×2): 0.3 ug/kg/h via INTRAVENOUS
  Filled 2018-02-07: qty 100

## 2018-02-07 MED ORDER — VANCOMYCIN HCL 10 G IV SOLR
1250.0000 mg | Freq: Once | INTRAVENOUS | Status: AC
  Administered 2018-02-07: 1250 mg via INTRAVENOUS
  Filled 2018-02-07: qty 1250

## 2018-02-07 MED ORDER — VANCOMYCIN HCL IN DEXTROSE 750-5 MG/150ML-% IV SOLN
750.0000 mg | Freq: Two times a day (BID) | INTRAVENOUS | Status: DC
  Administered 2018-02-07 – 2018-02-09 (×3): 750 mg via INTRAVENOUS
  Filled 2018-02-07 (×4): qty 150

## 2018-02-07 MED ORDER — INSULIN ASPART 100 UNIT/ML ~~LOC~~ SOLN
0.0000 [IU] | SUBCUTANEOUS | Status: DC
  Administered 2018-02-07: 2 [IU] via SUBCUTANEOUS
  Administered 2018-02-07 – 2018-02-10 (×7): 1 [IU] via SUBCUTANEOUS

## 2018-02-07 MED ORDER — PIPERACILLIN-TAZOBACTAM 3.375 G IVPB 30 MIN
3.3750 g | Freq: Once | INTRAVENOUS | Status: AC
  Administered 2018-02-07: 3.375 g via INTRAVENOUS
  Filled 2018-02-07: qty 50

## 2018-02-07 MED ORDER — PIPERACILLIN-TAZOBACTAM 3.375 G IVPB
3.3750 g | Freq: Three times a day (TID) | INTRAVENOUS | Status: DC
  Administered 2018-02-07 – 2018-02-10 (×9): 3.375 g via INTRAVENOUS
  Filled 2018-02-07 (×9): qty 50

## 2018-02-07 NOTE — Progress Notes (Signed)
Pharmacy Antibiotic Note  Danny Krause is a 77 y.o. male admitted on 01/24/2018 with pneumonia.  Pharmacy has been consulted for vancomycin/zosyn dosing. Tmax/24h 101.5, WBC up to 10.6. SCr 0.75 stable, CrCl~65-70.  Plan: Zosyn 3.375g IV (41min infusion) x1; then 3.375g IV q8h (4h infusion) Vancomycin 1250mg  IV x1; then 750mg  IV q12h Monitor clinical progress, c/s, renal function F/u de-escalation plan/LOT, vancomycin trough as indicated   Height: 5\' 5"  (165.1 cm) Weight: 147 lb 11.3 oz (67 kg) IBW/kg (Calculated) : 61.5  Temp (24hrs), Avg:99.2 F (37.3 C), Min:97.6 F (36.4 C), Max:102.5 F (39.2 C)  Recent Labs  Lab 02/01/18 0400  02/03/18 0608 02/04/18 0547 02/05/18 0500 02/06/18 0322 02/07/18 0351  WBC 7.3  --   --  9.4 9.7 9.6 10.6*  CREATININE 0.98   < > 0.75 0.73 0.71 0.72 0.75   < > = values in this interval not displayed.    Estimated Creatinine Clearance: 67.3 mL/min (by C-G formula based on SCr of 0.75 mg/dL).    No Known Allergies  Antimicrobials this admission: 6/6 vancomycin >>  6/6 zosyn >>   Dose adjustments this admission:   Microbiology results: 6/6 TA: 6/6 BCx: pending 6/6 UC:  Elicia Lamp, PharmD, BCPS Clinical Pharmacist 02/07/2018 11:55 AM

## 2018-02-07 NOTE — Progress Notes (Addendum)
PULMONARY / CRITICAL CARE MEDICINE   Name: Danny Krause MRN: [id number] DOB: 09-15-1940    ADMISSION DATE:  01/24/2018 CONSULTATION DATE:  01/24/2018  REFERRING MD:  Maudie Mercury  CHIEF COMPLAINT:  Seizures  BRIEF SUMMARY:   77 y/o M who presented 5/23 after being found unresponsive at home with concern for seizure like activity.  He was intubated in the ER for hypercapnic respiratory failure.  He has a history of CVA with encephalomalacia and seizures (first dx 12/2017), on Keppra with questionable compliance.  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   On the floor 5/27, he developed new left-sided weakness and a CT angiogram was performed which showed no evidence of large vessel occlusion.  Neurology consulted.  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  In light of his extremely poor baseline respiratory function and prior CO2 retention during this admission only 1 mg of Ativan was administered on the floor after which there was still seizure activity on the EEG but the patient was profoundly somnolent.  It was elected elected to transfer him to ICU for intubation to allow deep sedation to burst suppression.  He required as much as 80 mcg of propofol,  And 20 mg/hr Versed for sedation to burst supression and neo-synephrine for BP support.    SUBJECTIVE:  Currently on full vent support with tracheostomy  VITAL SIGNS: BP 135/75   Pulse 95   Temp (!) 101.5 F (38.6 C) (Axillary)   Resp (!) 34   Ht 5\' 5"  (1.651 m)   Wt 67 kg (147 lb 11.3 oz)   SpO2 97%   BMI 24.58 kg/m   HEMODYNAMICS:    VENTILATOR SETTINGS: Vent Mode: PRVC FiO2 (%):  [40 %] 40 % Set Rate:  [21 bmp] 21 bmp Vt Set:  [500 mL] 500 mL PEEP:  [5 cmH20] 5 cmH20 Plateau Pressure:  [14 cmH20-19 cmH20] 17 cmH20  INTAKE / OUTPUT: I/O last 3 completed shifts: In: 2184 [I.V.:116.5; NG/GT:1067.5; IV Piggyback:1000] Out: 2705 [Urine:2705]  PHYSICAL  EXAMINATION: General: Ill-appearing male HEENT: Calcification in place Neuro:  CV: Heart sounds distant PULM: even/non-labored, lungs bilaterally decreased YW:VPXT, non-tender, bsx4 active  Extremities: warm/dry 3+ edema  Skin: no rashes or lesions   LABS:  BMET Recent Labs  Lab 02/05/18 0500 02/06/18 0322 02/07/18 0351  NA 142 140 139  K 3.4* 3.9 3.4*  CL 110 115* 109  CO2 24 22 22   BUN 20 19 20   CREATININE 0.71 0.72 0.75  GLUCOSE 106* 109* 128*    Electrolytes Recent Labs  Lab 02/02/18 0346 02/03/18 0608  02/05/18 0500 02/06/18 0322 02/07/18 0351  CALCIUM 8.1* 8.3*   < > 8.1* 8.2* 8.3*  MG 1.9 2.0  --   --  2.0 1.9  PHOS 2.6  --   --   --  2.9 3.5   < > = values in this interval not displayed.    CBC Recent Labs  Lab 02/05/18 0500 02/06/18 0322 02/07/18 0351  WBC 9.7 9.6 10.6*  HGB 9.2* 9.0* 9.1*  HCT 31.0* 30.4* 30.3*  PLT 419* 444* 457*   Coag's Recent Labs  Lab 02/06/18 0322  INR 1.05    Sepsis Markers Recent Labs  Lab 02/01/18 0400 02/02/18 0346  PROCALCITON 0.10 0.10   ABG Recent Labs  Lab 02/02/18 0933 02/04/18 0235 02/06/18 0408  PHART 7.345* 7.379 7.381  PCO2ART 39.5 34.8 36.7  PO2ART 98.0 91.0 116*   Liver  Enzymes No results for input(s): AST, ALT, ALKPHOS, BILITOT, ALBUMIN in the last 168 hours.  Cardiac Enzymes No results for input(s): TROPONINI, PROBNP in the last 168 hours.  Glucose Recent Labs  Lab 02/06/18 1134 02/06/18 1551 02/06/18 2027 02/07/18 0011 02/07/18 0414 02/07/18 0730  GLUCAP 117* 102* 117* 127* 124* 134*   Imaging Ct Abdomen Wo Contrast  Result Date: 02/06/2018 CLINICAL DATA:  Dysphagia. Evaluate anatomy prior to potential percutaneous gastrostomy tube placement. EXAM: CT ABDOMEN WITHOUT CONTRAST TECHNIQUE: Multidetector CT imaging of the abdomen was performed following the standard protocol without IV contrast. COMPARISON:  None. FINDINGS: The lack of intravenous contrast limits the ability  to evaluate solid abdominal organs. Lower chest: Limited visualization of the lower thorax demonstrates minimal subsegmental atelectasis within the imaged bilateral lower lobes, right greater than left. No discrete focal airspace opacities. No pleural effusion. Normal heart size. There is diffuse decreased attenuation intra cardiac blood pool suggestive of anemia. Post median sternotomy and aortic valve replacement. No pericardial effusion. Hepatobiliary: Normal hepatic contour punctate (approximately 0.7 cm) hypoattenuating lesion within the left lobe of the liver (image 16, series 3) is too small to accurately characterize though likely represents a hepatic cyst. Normal noncontrast appearance of the gallbladder given degree distention. No radiopaque gallstones. No ascites. Pancreas: Normal noncontrast appearance of the pancreas Spleen: Normal noncontrast appearance the spleen Adrenals/Urinary Tract: There is a minimal amount of grossly symmetric likely age related perinephric stranding. No renal stones. No definite urinary obstruction. Normal noncontrast appearance the bilateral adrenal glands. The urinary bladder was not imaged. Stomach/Bowel: The anterior wall the stomach is well apposed against the ventral wall of the abdomen without interposed liver or colon. Enteric tube tip terminates within the descending duodenum. No evidence of enteric obstruction. No pneumoperitoneum, pneumatosis or portal venous gas. Vascular/Lymphatic: Note is made of an infrarenal abdominal aortic aneurysm measuring approximately 4.3 x 4.4 cm, as measured in greatest oblique short axis axial (image 42, series 3) and coronal (image 70, series 6) dimensions respectively. Scattered atherosclerotic plaque within the abdominal aorta. No bulky retroperitoneal, porta hepatis or mesenteric lymphadenopathy on this noncontrast examination. Other: Diffuse body wall anasarca. Musculoskeletal: No acute or aggressive osseous abnormalities.  Mild-to-moderate multilevel lumbar spine DDD, worse at L4-L5 and L5-S1 with disc space height loss, endplate irregularity and sclerosis. IMPRESSION: 1. Gastric anatomy amenable to potential percutaneous gastrostomy tube placement as indicated. 2. Infrarenal abdominal aortic aneurysm measuring 4.4 cm in maximal diameter. Recommend follow-up aortic ultrasound in 1 year. This recommendation follows ACR consensus guidelines: White Paper of the ACR Incidental Findings Committee II on Vascular Findings. J Am Coll Radiol 2013; 10:789-794. insert aneurysm. 3.  Aortic Atherosclerosis (ICD10-I70.0). Electronically Signed   By: Sandi Mariscal M.D.   On: 02/06/2018 16:16   Dg Chest Port 1 View  Result Date: 02/06/2018 CLINICAL DATA:  Initial evaluation for tracheostomy. EXAM: PORTABLE CHEST 1 VIEW COMPARISON:  Prior radiograph from earlier the same day. FINDINGS: There has been interval placement of a tracheostomy tube, appropriately position overlying the upper airway, distal aspect well positioned above the carina. Feeding tube courses into the abdomen. Left subclavian approach central venous catheter unchanged with tip overlying the mid SVC. Median sternotomy wires with underlying CABG markers and prosthetic aortic valve noted. Stable cardiomegaly. Mediastinal silhouette unchanged and within normal limits. Visualized lungs are well inflated. Mild streaky left basilar opacities likely reflect atelectatic changes, although mild/early infiltrates not excluded. No pulmonary edema or visible pleural effusion. No pneumothorax. Osseous structures unchanged. IMPRESSION: 1.  Interval placement of tracheostomy tube, well positioned overlying the upper airway. 2. Mild linear left basilar opacity, likely atelectasis. Electronically Signed   By: Jeannine Boga M.D.   On: 02/06/2018 12:57   STUDIES:  EEG showed:  1. Moderate diffuse background slowing 2. Lateralized periodic discharges (LPDs) over the right frontotemporal region  - consistent with possible epileptogenic focus. 3. Brief 5-second period of rhythmic theta activity over the right temporal region without evolution CTA Head / Neck 5/27 >> no intracranial or extracranial flow reducing stenosis or emergent large vessel occlusion  CT Head Perfusion 5/27 >> relative increased cerebral blood flow & cerebral blood volume in the right hemisphere as compared to the left, correlates with suspected status / epileptiform activity EEG 5/29 >> abnormal continuous EEG due to burst suppression with occasional right central LPD's. No ictal evolution.  Pattern consistent with a diffuse cerebral disturbance and a superimposed focal epileptogenic disturbance on the right.  LPD's could be representative of an injury pattern given their persistence despite AED treatment.   CULTURES: 02/07/2018 blood cultures x2>> 02/07/2018 sputum culture>> 02/07/2018 urine culture>>  ANTIBIOTICS: Zosyn 5/27 >> 02/01/2018 6/6 vanc> 6/6 zoysn>>  SIGNIFICANT EVENTS: 5/23  Admit, intubated with concern for seizure 5/24  Extubated  5/24  Neuro rec's continuing dual anticonvulsant therapy until non-compliance can be confirmed. 5/27  On floor, had left sided weakness felt to be Todd seizure, reintubated 5/30  Ongoing concern for seizure, remains suppressed  02/01/2018 heavy sedation for burst supression discontinued 6/4 not sedated, opens eyes, not following commands  LINES/TUBES: L [place] TLC 5/27 >> ETT 5/27 >> 1 02/06/2018 Tracheostomy placed on 02/06/2018>>  DISCUSSION: 77 year old with a distant history of a right frontal CVA with encephalomalacia on his CT scan who was treated for status epilepticus with sedation to burst suppression.  He also has a history of O2 dependent chronic lung disease.  He has not yet emerged from deep sedation, and continues to require mechanical ventilation.  Calcium placed on 02/06/2018  ASSESSMENT / PLAN:  NEUROLOGIC A:   Seizure Disorder Hx CVA P:   Neuro following  - Spoke with neuro, feeling is that this likely the new baseline D/C propofol Continue vimpat, keppra, topamax  Daily WUA -- wean propofol to off as able - discussed with RN  PULMONARY A: O2 Dependent COPD  Rule Out Aspiration  Respiratory Acidosis - corrected  VDRF P: Pressure support trials with no extubation due to mental status Currently with tracheostomy May need placement in skilled nursing facility and was trach's  CARDIOVASCULAR A:  Hypertension  Hx AVR, CAD s/p CABG, multiple stents  Hypotension secondary to sedation P:  Continue ARB Monitor blood pressure  RENAL Recent Labs  Lab 02/05/18 0500 02/06/18 0322 02/07/18 0351  K 3.4* 3.9 3.4*    A:   Hypokalemia  P:   Daily chemistry Replete lecture lites  GASTROINTESTINAL A:   Moderate Nutritional Risk  P:   Currently n.p.o. Plan for PEG placement today 02/07/2018  HEMATOLOGIC  A:   Anemia - without bleeding, suspect of chronic disease P:  DVT prophylaxis Transfuse per protocol DVT prophylaxis  INFECTIOUS A:   No acDVT prophylaxistive issue  P: Currently off antibiotics Noted temperature spike of 101.5  0700 this a.m. 02/07/2018 we will panculture but will hold off on antibiotics at this time.  ENDOCRINE CBG (last 3)  Recent Labs    02/07/18 0011 02/07/18 0414 02/07/18 0730  GLUCAP 127* 124* 134*    No active issue  A:   Sliding scale protocol  App cct 30 min   Richardson Landry Minor ACNP Maryanna Shape PCCM Pager [phone] till 1 pm If no answer page [phone] 02/07/2018, 10:05 AM  Attending Note:  77 year old male with seizure history that is slow to wake up.  Trached on 6/5 but now is febrile.  On exam, opens eyes but does not follow command with coarse BS diffusely.  I reviewed CXR myself, trach is in good position.  Spoke with IR, will proceed with PEG placement when no longer febrile.  Pan cultures.  Vanc and zosyn started.  Continue vent support.  PCCM will continue to follow.  The  patient is critically ill with multiple organ systems failure and requires high complexity decision making for assessment and support, frequent evaluation and titration of therapies, application of advanced monitoring technologies and extensive interpretation of multiple databases.   Critical Care Time devoted to patient care services described in this note is  32  Minutes. This time reflects time of care of this signee Dr [name]. This critical care time does not reflect procedure time, or teaching time or supervisory time of PA/NP/Med student/Med Resident etc but could involve care discussion time.  Rush Farmer, M.D. [facility] Pulmonary/Critical Care Medicine. Pager: [phone]. After hours pager: [phone].

## 2018-02-07 NOTE — Progress Notes (Signed)
[facility] [place] ADULT ICU REPLACEMENT PROTOCOL FOR AM LAB REPLACEMENT ONLY  The patient does apply for the [facility] Adult ICU Electrolyte Replacment Protocol based on the criteria listed below:   1. Is GFR >/= 40 ml/min? Yes.    Patient's GFR today is >60 2. Is urine output >/= 0.5 ml/kg/hr for the last 6 hours? Yes.   Patient's UOP is .8 ml/kg/hr 3. Is BUN < 60 mg/dL? Yes.    Patient's BUN today is 20 4. Abnormal electrolyte(s): K-3.4 5. Ordered repletion with: per protocol 6. If a panic level lab has been reported, has the CCM MD in charge been notified? Yes.  .   Physician:  Dr. Jonetta Speak, Philis Nettle 02/07/2018 5:49 AM

## 2018-02-07 NOTE — Telephone Encounter (Signed)
Patient still admitted 02/07/18 at [facility].

## 2018-02-07 NOTE — Consult Note (Signed)
Chief Complaint: Patient was seen in consultation today for percutaneous gastric tube placement Chief Complaint  Patient presents with  . Seizures   at the request of Dr Fatima Blank  Supervising Physician: Markus Daft  Patient Status: [facility] - In-pt  History of Present Illness: Danny Krause is a 77 y.o. male   Found down at home To ED 5/23; intubated for hypercapnic respiratory failure Status epilepticus- heavy sedation to burst suppression Hx CVA with encephalomalacia Trach yesterday; vent  Nutritional risk- dysphagia Long term care Request for percutaneous gastric tube placement Imaging has been reviewed with Dr Pascal Lux-- approves procedure  Past Medical History:  Diagnosis Date  . Anginal pain ([place])    with exertion  . Coronary artery disease   . Critical aortic valve stenosis   . Dyspnea    with exertion  . Murmur   . Seizure ([place])   . Syncope     Past Surgical History:  Procedure Laterality Date  . AORTIC VALVE REPLACEMENT N/A 07/04/2017   Procedure: AORTIC VALVE REPLACEMENT (AVR);  Surgeon: Grace Isaac, MD;  Location: [place];  Service: Open Heart Surgery;  Laterality: N/A;  . CORONARY ARTERY BYPASS GRAFT N/A 07/04/2017   Procedure: CORONARY ARTERY BYPASS GRAFTING (CABG) x three, using left internal mammary artery and right leg greater saphenous vein harvested endoscopically;  Surgeon: Grace Isaac, MD;  Location: [place];  Service: Open Heart Surgery;  Laterality: N/A;  . NO PAST SURGERIES    . RIGHT/LEFT HEART CATH AND CORONARY ANGIOGRAPHY N/A 06/25/2017   Procedure: RIGHT/LEFT HEART CATH AND CORONARY ANGIOGRAPHY;  Surgeon: Troy Sine, MD;  Location: [place] [facility];  Service: Cardiovascular;  Laterality: N/A;  . TEE WITHOUT CARDIOVERSION N/A 07/04/2017   Procedure: TRANSESOPHAGEAL ECHOCARDIOGRAM (TEE);  Surgeon: Grace Isaac, MD;  Location: [place];  Service: Open Heart Surgery;  Laterality: N/A;  . THORACIC AORTOGRAM  06/25/2017   Procedure: THORACIC AORTOGRAM;  Surgeon: Troy Sine, MD;  Location: [place] [facility];  Service: Cardiovascular;;  aortic root     Allergies: Patient has no known allergies.  Medications: Prior to Admission medications   Medication Sig Start Date End Date Taking? Authorizing Provider  acetaminophen (TYLENOL) 650 MG CR tablet Take 650 mg by mouth every 8 (eight) hours as needed for pain.   Yes [provider]  budesonide-formoterol (SYMBICORT) 160-4.5 MCG/ACT inhaler Inhale 2 puffs into the lungs 2 (two) times daily. Patient taking differently: Inhale 2 puffs into the lungs daily.  01/10/18  Yes Juanito Doom, MD  levETIRAcetam (KEPPRA) 500 MG tablet Take 2 tablets (1,000 mg total) by mouth 2 (two) times daily. 12/20/17  Yes Samuella Cota, MD  metoprolol tartrate (LOPRESSOR) 25 MG tablet TAKE 1/2 TABLET BY MOUTH TWICE A DAY 12/05/17  Yes Hilty, Nadean Corwin, MD  olmesartan (BENICAR) 40 MG tablet Take 1 tablet (40 mg total) by mouth daily. 12/26/17  Yes Hilty, Nadean Corwin, MD  albuterol (PROAIR HFA) 108 (90 Base) MCG/ACT inhaler Inhale 2 puffs into the lungs every 6 (six) hours as needed for wheezing or shortness of breath. 01/10/18   Juanito Doom, MD     Family History  Problem Relation Age of Onset  . Hernia Father   . Stroke Brother     Social History   Socioeconomic History  . Marital status: Married    Spouse name: [name]  . Number of children: 2  . Years of education: 55  . Highest education  level: Not on file  Occupational History  . Occupation: Retired  Scientific laboratory technician  . Financial resource strain: Not on file  . Food insecurity:    Worry: Not on file    Inability: Not on file  . Transportation needs:    Medical: Not on file    Non-medical: Not on file  Tobacco Use  . Smoking status: Former Smoker    Packs/day: 2.00    Years: 64.00    Pack years: 128.00    Types: Cigarettes    Last attempt to quit: 07/04/2017    Years since quitting: 0.5  .  Smokeless tobacco: Never Used  . Tobacco comment: SINCE I WAS 77 YRS OLD  Substance and Sexual Activity  . Alcohol use: No  . Drug use: No  . Sexual activity: Not on file  Lifestyle  . Physical activity:    Days per week: Not on file    Minutes per session: Not on file  . Stress: Not on file  Relationships  . Social connections:    Talks on phone: Not on file    Gets together: Not on file    Attends religious service: Not on file    Active member of club or organization: Not on file    Attends meetings of clubs or organizations: Not on file    Relationship status: Not on file  Other Topics Concern  . Not on file  Social History Narrative   Lives with wife   Caffeine use: some   Right handed    Review of Systems: A 12 point ROS discussed and pertinent positives are indicated in the HPI above.  All other systems are negative.  Review of Systems  Constitutional: Positive for activity change and fever.  Respiratory:       Vent trach    Vital Signs: BP (!) 149/75   Pulse (!) 107   Temp (!) 101.5 F (38.6 C) (Axillary)   Resp (!) 25   Ht 5\' 5"  (1.651 m)   Wt 147 lb 11.3 oz (67 kg)   SpO2 97%   BMI 24.58 kg/m   Physical Exam  Constitutional:  No response Sedated   Cardiovascular: Normal rate.  Pulmonary/Chest:  vent  Abdominal: Soft.  Skin: Skin is warm and dry.  Psychiatric:  Consented with wife over phone  Nursing note and vitals reviewed.   Imaging: Ct Abdomen Wo Contrast  Result Date: 02/06/2018 CLINICAL DATA:  Dysphagia. Evaluate anatomy prior to potential percutaneous gastrostomy tube placement. EXAM: CT ABDOMEN WITHOUT CONTRAST TECHNIQUE: Multidetector CT imaging of the abdomen was performed following the standard protocol without IV contrast. COMPARISON:  None. FINDINGS: The lack of intravenous contrast limits the ability to evaluate solid abdominal organs. Lower chest: Limited visualization of the lower thorax demonstrates minimal subsegmental  atelectasis within the imaged bilateral lower lobes, right greater than left. No discrete focal airspace opacities. No pleural effusion. Normal heart size. There is diffuse decreased attenuation intra cardiac blood pool suggestive of anemia. Post median sternotomy and aortic valve replacement. No pericardial effusion. Hepatobiliary: Normal hepatic contour punctate (approximately 0.7 cm) hypoattenuating lesion within the left lobe of the liver (image 16, series 3) is too small to accurately characterize though likely represents a hepatic cyst. Normal noncontrast appearance of the gallbladder given degree distention. No radiopaque gallstones. No ascites. Pancreas: Normal noncontrast appearance of the pancreas Spleen: Normal noncontrast appearance the spleen Adrenals/Urinary Tract: There is a minimal amount of grossly symmetric likely age related perinephric stranding. No  renal stones. No definite urinary obstruction. Normal noncontrast appearance the bilateral adrenal glands. The urinary bladder was not imaged. Stomach/Bowel: The anterior wall the stomach is well apposed against the ventral wall of the abdomen without interposed liver or colon. Enteric tube tip terminates within the descending duodenum. No evidence of enteric obstruction. No pneumoperitoneum, pneumatosis or portal venous gas. Vascular/Lymphatic: Note is made of an infrarenal abdominal aortic aneurysm measuring approximately 4.3 x 4.4 cm, as measured in greatest oblique short axis axial (image 42, series 3) and coronal (image 70, series 6) dimensions respectively. Scattered atherosclerotic plaque within the abdominal aorta. No bulky retroperitoneal, porta hepatis or mesenteric lymphadenopathy on this noncontrast examination. Other: Diffuse body wall anasarca. Musculoskeletal: No acute or aggressive osseous abnormalities. Mild-to-moderate multilevel lumbar spine DDD, worse at L4-L5 and L5-S1 with disc space height loss, endplate irregularity and  sclerosis. IMPRESSION: 1. Gastric anatomy amenable to potential percutaneous gastrostomy tube placement as indicated. 2. Infrarenal abdominal aortic aneurysm measuring 4.4 cm in maximal diameter. Recommend follow-up aortic ultrasound in 1 year. This recommendation follows ACR consensus guidelines: White Paper of the ACR Incidental Findings Committee II on Vascular Findings. J Am Coll Radiol 2013; 10:789-794. insert aneurysm. 3.  Aortic Atherosclerosis (ICD10-I70.0). Electronically Signed   By: Sandi Mariscal M.D.   On: 02/06/2018 16:16   Ct Angio Head W Or Wo Contrast  Result Date: 01/28/2018 CLINICAL DATA:  LEFT hemiparesis and neglect. Patient with recurrent status epilepticus. EXAM: CT ANGIOGRAPHY HEAD AND NECK TECHNIQUE: Multidetector CT imaging of the head and neck was performed using the standard protocol during bolus administration of intravenous contrast. Multiplanar CT image reconstructions and MIPs were obtained to evaluate the vascular anatomy. Carotid stenosis measurements (when applicable) are obtained utilizing NASCET criteria, using the distal internal carotid diameter as the denominator. CONTRAST:  128mL ISOVUE-370 IOPAMIDOL (ISOVUE-370) INJECTION 76% COMPARISON:  CT head 12/18/2017. CT head 01/28/2018. perfusion reported separately. FINDINGS: CTA NECK Aortic arch: Standard branching. Imaged portion shows no evidence of aneurysm or dissection. No significant stenosis of the major arch vessel origins. Right carotid system: No evidence of dissection, stenosis (50% or greater) or occlusion. Left carotid system: No evidence of dissection, stenosis (50% or greater) or occlusion. Vertebral arteries: LEFT vertebral dominant. No evidence of dissection, stenosis (50% or greater) or occlusion. Nonvascular soft tissues: COPD, widespread emphysematous change. No neck masses. Spondylosis. Edentulous. Aortic atherosclerosis. CTA HEAD Anterior circulation: Cavernous carotid calcification. No significant stenosis,  proximal occlusion, aneurysm, or vascular malformation. Posterior circulation: Both vertebrals contribute to basilar formation. No significant stenosis, proximal occlusion, aneurysm, or vascular malformation. Venous sinuses: As permitted by contrast timing, patent. Anatomic variants: None of significance. Delayed phase:   No abnormal intracranial enhancement. Review of the MIP images confirms the above findings IMPRESSION: No intracranial or extracranial flow reducing stenosis or emergent large vessel occlusion. Aortic Atherosclerosis (ICD10-I70.0) and Emphysema (ICD10-J43.9). Electronically Signed   By: Staci Righter M.D.   On: 01/28/2018 16:20   Ct Head Wo Contrast  Result Date: 01/25/2018 CLINICAL DATA:  Altered LOC EXAM: CT HEAD WITHOUT CONTRAST TECHNIQUE: Contiguous axial images were obtained from the base of the skull through the vertex without intravenous contrast. COMPARISON:  None. FINDINGS: Brain: No acute territorial infarction, hemorrhage or intracranial mass. Mild small vessel ischemic changes of the white matter. Small focus of encephalomalacia in the right frontal lobe consistent with old infarct. Mild atrophy. Nonenlarged ventricles. Vascular: No hyperdense vessels.  Carotid vascular calcification Skull: Normal. Negative for fracture or focal lesion. Sinuses/Orbits: Mucosal thickening in  the ethmoid sinuses. No acute orbital abnormality. Other: None IMPRESSION: 1. No CT evidence for acute intracranial abnormality. 2. Atrophy and small vessel ischemic changes of the white matter. Old right frontal lobe infarct Electronically Signed   By: Donavan Foil M.D.   On: 01/25/2018 00:33   Ct Angio Neck W Or Wo Contrast  Result Date: 01/28/2018 CLINICAL DATA:  LEFT hemiparesis and neglect. Patient with recurrent status epilepticus. EXAM: CT ANGIOGRAPHY HEAD AND NECK TECHNIQUE: Multidetector CT imaging of the head and neck was performed using the standard protocol during bolus administration of  intravenous contrast. Multiplanar CT image reconstructions and MIPs were obtained to evaluate the vascular anatomy. Carotid stenosis measurements (when applicable) are obtained utilizing NASCET criteria, using the distal internal carotid diameter as the denominator. CONTRAST:  170mL ISOVUE-370 IOPAMIDOL (ISOVUE-370) INJECTION 76% COMPARISON:  CT head 12/18/2017. CT head 01/28/2018. perfusion reported separately. FINDINGS: CTA NECK Aortic arch: Standard branching. Imaged portion shows no evidence of aneurysm or dissection. No significant stenosis of the major arch vessel origins. Right carotid system: No evidence of dissection, stenosis (50% or greater) or occlusion. Left carotid system: No evidence of dissection, stenosis (50% or greater) or occlusion. Vertebral arteries: LEFT vertebral dominant. No evidence of dissection, stenosis (50% or greater) or occlusion. Nonvascular soft tissues: COPD, widespread emphysematous change. No neck masses. Spondylosis. Edentulous. Aortic atherosclerosis. CTA HEAD Anterior circulation: Cavernous carotid calcification. No significant stenosis, proximal occlusion, aneurysm, or vascular malformation. Posterior circulation: Both vertebrals contribute to basilar formation. No significant stenosis, proximal occlusion, aneurysm, or vascular malformation. Venous sinuses: As permitted by contrast timing, patent. Anatomic variants: None of significance. Delayed phase:   No abnormal intracranial enhancement. Review of the MIP images confirms the above findings IMPRESSION: No intracranial or extracranial flow reducing stenosis or emergent large vessel occlusion. Aortic Atherosclerosis (ICD10-I70.0) and Emphysema (ICD10-J43.9). Electronically Signed   By: Staci Righter M.D.   On: 01/28/2018 16:20   Mr Brain Wo Contrast  Result Date: 02/04/2018 CLINICAL DATA:  LEFT-sided weakness. History of seizures. Assess encephalopathy. EXAM: MRI HEAD WITHOUT CONTRAST TECHNIQUE: Multiplanar, multiecho  pulse sequences of the brain and surrounding structures were obtained without intravenous contrast. COMPARISON:  CT HEAD Jan 28, 2018 and MRI of the head March 14, 2017 lens FINDINGS: INTRACRANIAL CONTENTS: Patchy reduced diffusion bilateral cerebella, RIGHT occipital lobe and to lesser extent RIGHT parietal lobe with normalized ADC values and T2 shine through. A few scattered chronic micro hemorrhages seen with hypertension. Small area RIGHT frontal lobe encephalomalacia. Ventricles and sulci are normal for patient's age. Prominent basal ganglia perivascular spaces associated with chronic small vessel ischemic disease. A few scattered subcentimeter supratentorial white matter FLAIR T2 hyperintensities compatible with mild chronic small vessel ischemic changes, less than expected for age. No midline shift, mass effect or masses. No abnormal extra-axial fluid collections. Symmetric demonstrate symmetric normal morphology, size and signal. VASCULAR: Normal major intracranial vascular flow voids present at skull base. SKULL AND UPPER CERVICAL SPINE: No abnormal sellar expansion. No suspicious calvarial bone marrow signal. Craniocervical junction maintained. SINUSES/ORBITS: Mild paranasal sinus mucosal thickening. Bilateral mastoid effusions.The included ocular globes and orbital contents are non-suspicious. OTHER: Patient is edentulous.  Life-support lines in place. IMPRESSION: 1. Subacute to chronic small supra- and infratentorial nonhemorrhagic infarcts. 2. Old small RIGHT frontal lobe/MCA territory infarct. Electronically Signed   By: Elon Alas M.D.   On: 02/04/2018 05:29   Ct Cerebral Perfusion W Contrast  Addendum Date: 01/28/2018   ADDENDUM REPORT: 01/28/2018 19:09 ADDENDUM: Following review with the  ordering provider, we discussed the relative increased cerebral blood flow and cerebral blood volume in the RIGHT hemisphere as compared to the LEFT, which in this patient, correlates with suspected  status/epileptiform activity. Electronically Signed   By: Staci Righter M.D.   On: 01/28/2018 19:09   Result Date: 01/28/2018 CLINICAL DATA:  LEFT-sided hemiparesis and neglect. History of seizure disorder. EXAM: CT PERFUSION BRAIN TECHNIQUE: Multiphase CT imaging of the brain was performed following IV bolus contrast injection. Subsequent parametric perfusion maps were calculated using RAPID software. CONTRAST:  187mL ISOVUE-370 IOPAMIDOL (ISOVUE-370) INJECTION 76% COMPARISON:  CTA head neck reported separately. FINDINGS: CT Brain Perfusion Findings: CBF (<30%) Volume: 45mL Perfusion (Tmax>6.0s) volume: 11mL Mismatch Volume: 16mL ASPECTS on noncontrast CT Head: 9 at 3:46 p.m. today. Infarct Core: 0 mL Infarction Location:Not applicable IMPRESSION: Negative CT perfusion. Electronically Signed: By: Staci Righter M.D. On: 01/28/2018 16:27   Dg Chest Port 1 View  Result Date: 02/06/2018 CLINICAL DATA:  Initial evaluation for tracheostomy. EXAM: PORTABLE CHEST 1 VIEW COMPARISON:  Prior radiograph from earlier the same day. FINDINGS: There has been interval placement of a tracheostomy tube, appropriately position overlying the upper airway, distal aspect well positioned above the carina. Feeding tube courses into the abdomen. Left subclavian approach central venous catheter unchanged with tip overlying the mid SVC. Median sternotomy wires with underlying CABG markers and prosthetic aortic valve noted. Stable cardiomegaly. Mediastinal silhouette unchanged and within normal limits. Visualized lungs are well inflated. Mild streaky left basilar opacities likely reflect atelectatic changes, although mild/early infiltrates not excluded. No pulmonary edema or visible pleural effusion. No pneumothorax. Osseous structures unchanged. IMPRESSION: 1. Interval placement of tracheostomy tube, well positioned overlying the upper airway. 2. Mild linear left basilar opacity, likely atelectasis. Electronically Signed   By: Jeannine Boga M.D.   On: 02/06/2018 12:57   Dg Chest Port 1 View  Result Date: 02/06/2018 CLINICAL DATA:  Endotracheal tube present. Acute respiratory failure with hypercapnia. Postictal coma. EXAM: PORTABLE CHEST 1 VIEW COMPARISON:  02/05/2018 FINDINGS: Endotracheal tube, left subclavian central venous catheter, and feeding tube remain in appropriate position. Prior CABG again noted. Heart size is stable. New mild opacity is seen at the right lung base, which may be due to atelectasis or infiltrate. Left lung remains clear. No pneumothorax or pleural effusion visualized. IMPRESSION: New mild right atelectasis versus infiltrate. Electronically Signed   By: Earle Gell M.D.   On: 02/06/2018 09:35   Dg Chest Portable 1 View  Result Date: 02/05/2018 CLINICAL DATA:  Respiratory failure EXAM: PORTABLE CHEST 1 VIEW COMPARISON:  02/03/2018 FINDINGS: Cardiac shadow is mildly enlarged but stable. Feeding catheter, endotracheal tube and left subclavian central line are again seen in satisfactory position. The nasogastric catheter has been removed. The lungs are well aerated bilaterally with very mild chronic interstitial changes. No acute infiltrate or sizable effusion is noted. Previously seen basilar atelectasis has cleared. IMPRESSION: Tubes and lines as described above. No acute abnormality noted. Electronically Signed   By: Inez Catalina M.D.   On: 02/05/2018 07:49   Dg Chest Port 1 View  Result Date: 02/03/2018 CLINICAL DATA:  Check endotracheal tube placement EXAM: PORTABLE CHEST 1 VIEW COMPARISON:  02/03/2018 FINDINGS: Cardiac shadow is stable. Postsurgical changes are again seen. Left subclavian central line, endotracheal tube and nasogastric catheter are noted in satisfactory position. The endotracheal tube has been advanced and now lies 3.9 cm from the carina. The lungs are well aerated bilaterally. Stable bibasilar atelectatic changes are noted. No new  focal abnormality is seen. IMPRESSION: Recent  endotracheal tube advancement. Stable bibasilar atelectasis. Electronically Signed   By: Inez Catalina M.D.   On: 02/03/2018 16:46   Dg Chest Port 1 View  Result Date: 02/03/2018 CLINICAL DATA:  Ventilator dependent respiratory failure. Follow-up bibasilar atelectasis and/or pneumonia. EXAM: PORTABLE CHEST 1 VIEW COMPARISON:  02/02/2018, 02/01/2018 and earlier. FINDINGS: Endotracheal tube tip projects below the thoracic inlet approximately 9 cm above the carina, unchanged. Nasogastric tube courses below the diaphragm into the stomach. LEFT subclavian central venous catheter tip projects over the UPPER SVC, unchanged. Prior sternotomy for CABG and aortic valve replacement. Cardiac silhouette moderately enlarged, unchanged. Thoracic aorta tortuous and atherosclerotic, unchanged. Prominent central pulmonary arteries, unchanged. Interval worsening of the patchy and streaky airspace opacities in the lung bases since yesterday, similar in appearance to the 02/01/2018 examination. No new pulmonary parenchymal abnormalities elsewhere. Pulmonary vascularity normal without evidence of pulmonary edema. IMPRESSION: 1.  Support apparatus satisfactory. 2. Worsening atelectasis and/or pneumonia involving the lung bases BILATERAL. No new pulmonary parenchymal abnormalities elsewhere. 3. Stable cardiomegaly without evidence of pulmonary edema. Electronically Signed   By: Evangeline Dakin M.D.   On: 02/03/2018 16:23   Dg Chest Port 1 View  Result Date: 02/02/2018 CLINICAL DATA:  Seizures.  Patient found unresponsive 01/24/2018. EXAM: PORTABLE CHEST 1 VIEW COMPARISON:  Single-view of the chest 02/01/2018 and 01/30/2018. FINDINGS: Endotracheal tube, left subclavian catheter and NG tube are unchanged. Bibasilar atelectasis seen on yesterday's examination shows some improvement. Heart size is upper normal. No pneumothorax or pleural effusion. IMPRESSION: Improved bibasilar atelectasis.  No new abnormality. Electronically Signed    By: Inge Rise M.D.   On: 02/02/2018 09:15   Dg Chest Port 1 View  Result Date: 02/01/2018 CLINICAL DATA:  Seizure. EXAM: PORTABLE CHEST 1 VIEW COMPARISON:  One-view chest x-ray 01/30/2018 FINDINGS: The heart size is normal. Endotracheal tube is stable. Left subclavian line is stable. NG tube courses off the inferior border the film. 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 Bibasilar airspace disease is similar to the prior exam. A left effusion is suspected. Upper lung fields are clear. Visualized soft tissues and bony thorax are unremarkable. IMPRESSION: 1. Stable bibasilar airspace opacities concerning for infection or aspiration. 2. Probable left pleural effusion. 3. Support apparatus is stable. Electronically Signed   By: San Morelle M.D.   On: 02/01/2018 07:56   Dg Chest Port 1 View  Result Date: 01/30/2018 CLINICAL DATA:  Tracheobronchitis. History of seizures found unresponsive. EXAM: PORTABLE CHEST 1 VIEW COMPARISON:  01/28/2018 FINDINGS: Endotracheal tube terminates 4.5-5 cm above the carina. Left subclavian catheter terminates over the upper SVC. Enteric tube courses into the left upper abdomen with tip not imaged. Sequelae of prior CABG are again identified. The cardiomediastinal silhouette is unchanged. Aortic atherosclerosis is noted. There are new mixed interstitial and airspace opacities in the left greater than right lung bases, possibly with small pleural effusions bilaterally. No pneumothorax is identified. IMPRESSION: New bibasilar opacities which may reflect aspiration or infectious pneumonia and atelectasis. Electronically Signed   By: Logan Bores M.D.   On: 01/30/2018 09:05   Dg Chest Port 1 View  Result Date: 01/28/2018 CLINICAL DATA:  Intubated.  Central line placement. EXAM: PORTABLE CHEST 1 VIEW COMPARISON:  12/20/2017. FINDINGS: Endotracheal tube in satisfactory position. Nasogastric tube extending into the stomach.  Left subclavian catheter with its tip in the proximal superior vena cava. No pneumothorax. Post CABG changes. Normal sized heart.  Clear lungs. The lungs are mildly hyperexpanded with mildly prominent interstitial markings. Diffuse osteopenia. IMPRESSION: 1. Satisfactory position of the endotracheal tube and left subclavian catheter. 2. Mild changes of COPD. Electronically Signed   By: Claudie Revering M.D.   On: 01/28/2018 19:21   Dg Chest Portable 1 View  Result Date: 01/25/2018 CLINICAL DATA:  Post intubation. EXAM: PORTABLE CHEST 1 VIEW COMPARISON:  Chest radiograph August 21, 2017 FINDINGS: Endotracheal tube tip projects 2 cm above the carina. Nasogastric tube side port at GE junction region, distal tip out of field-of-view. Stable cardiomegaly. Status post median sternotomy for cardiac valve replacement. Mildly calcified aortic arch. Pulmonary vascular congestion without pleural effusion or focal consolidation. No pneumothorax. Soft tissue planes included osseous structures are nonsuspicious. IMPRESSION: Endotracheal tube tip projects 2 cm above the carina. Nasogastric tube side port at GE junction region, distal tip out of field-of-view. Stable cardiomegaly with pulmonary vascular congestion. Aortic Atherosclerosis (ICD10-I70.0). Electronically Signed   By: Elon Alas M.D.   On: 01/25/2018 02:36   Dg Abd Portable 1v  Result Date: 01/28/2018 CLINICAL DATA:  Nasogastric tube placement. EXAM: PORTABLE ABDOMEN - 1 VIEW COMPARISON:  Portable chest obtained at the same time. FINDINGS: Nasogastric tube tip and side hole in the proximal stomach. Several borderline dilated loops of jejunum. Mild lumbar spine degenerative changes and mild scoliosis. IMPRESSION: 1. Nasogastric tube tip and side hole in the proximal stomach. 2. Minimal jejunal ileus or partial obstruction. Electronically Signed   By: Claudie Revering M.D.   On: 01/28/2018 19:23   Dg Abd Portable 1 View  Result Date: 01/25/2018 CLINICAL DATA:   Orogastric tube placement. EXAM: PORTABLE ABDOMEN - 1 VIEW COMPARISON:  None. FINDINGS: Side port projecting at GE junction region, distal tip in proximal stomach region. Included bowel gas pattern is nondilated and nonobstructive. No intra-abdominal mass effect or pathologic calcifications. Soft tissue planes and included osseous structures are nonsuspicious. IMPRESSION: Nasogastric tube tip projects in proximal stomach. Electronically Signed   By: Elon Alas M.D.   On: 01/25/2018 02:37   Ct Head Code Stroke Wo Contrast`  Result Date: 01/28/2018 CLINICAL DATA:  Code stroke.  LEFT-sided hemiparesis and neglect. EXAM: CT HEAD WITHOUT CONTRAST TECHNIQUE: Contiguous axial images were obtained from the base of the skull through the vertex without intravenous contrast. COMPARISON:  CT head 12/18/2017.  MR head 03/14/2017. FINDINGS: Brain: Asymmetry of the RIGHT insula as compared to the LEFT, see series 3, image 14, possible acute infarction. No definite lentiform nucleus involvement. Chronic RIGHT frontal cortex infarction. No hemorrhage, mass lesion, hydrocephalus, or extra-axial fluid. Mild atrophy. Hypoattenuation of white matter, likely small vessel disease. Vascular: Heavily calcified cavernous and supraclinoid internal carotid artery vessels. No definite emergent large vessel occlusion. Skull: Intact. Sinuses/Orbits: Negative. Other: None. ASPECTS [facility] Stroke Program Early CT Score) - Ganglionic level infarction (caudate, lentiform nuclei, internal capsule, insula, M1-M3 cortex): 6. RIGHT insula abnormal. - Supraganglionic infarction (M4-M6 cortex): 3 Total score (0-10 with 10 being normal): 9 IMPRESSION: 1. Concern for RIGHT hemisphere infarct, with asymmetric insular hypoattenuation not present previously. 2. No signs of large vessel occlusion. 3. ASPECTS is 9. 4. These results were communicated to Dr. Leonel Ramsay at 3:46 pmon 5/27/2019by text page via the [facility] messaging system. Electronically  Signed   By: Staci Righter M.D.   On: 01/28/2018 15:48    Labs:  CBC: Recent Labs    02/04/18 0547 02/05/18 0500 02/06/18 0322 02/07/18 0351  WBC 9.4 9.7 9.6 10.6*  HGB 9.0* 9.2*  9.0* 9.1*  HCT 30.5* 31.0* 30.4* 30.3*  PLT 354 419* 444* 457*    COAGS: Recent Labs    06/18/17 1504 07/03/17 1059 07/04/17 0625 07/04/17 1600 02/06/18 0322  INR 1.0 0.97  --  1.45 1.05  APTT 45* 47* 54* 51*  --     BMP: Recent Labs    02/04/18 0547 02/05/18 0500 02/06/18 0322 02/07/18 0351  NA 141 142 140 139  K 3.3* 3.4* 3.9 3.4*  CL 114* 110 115* 109  CO2 23 24 22 22   GLUCOSE 100* 106* 109* 128*  BUN 23* 20 19 20   CALCIUM 8.0* 8.1* 8.2* 8.3*  CREATININE 0.73 0.71 0.72 0.75  GFRNONAA >60 >60 >60 >60  GFRAA >60 >60 >60 >60    LIVER FUNCTION TESTS: Recent Labs    07/03/17 1059 12/18/17 2002 01/24/18 2313 01/29/18 0238  BILITOT 0.3 0.7 0.2* 0.7  AST 13* 21 33 17  ALT 9* 12* 21 14*  ALKPHOS 73 71 65 53  PROT 6.7 7.6 8.1 5.9*  ALBUMIN 3.3* 4.1 4.4 3.1*    TUMOR MARKERS: No results for input(s): AFPTM, CEA, CA199, CHROMGRNA in the last 8760 hours.  Assessment and Plan:  Hx CVA/encephalomalacia Seizures- status epilepticus Resp failure Vent/trach Dysphagia Long term care Scheduled for percutaneous gastric tube placement  T max 101.5 Will recheck in am Risks and benefits discussed with the patient's wife via phone including, but not limited to the need for a barium enema during the procedure, bleeding, infection, peritonitis, or damage to adjacent structures.  All of the patient's wifes questions were answered, she is agreeable to proceed. Consent signed and in chart.  Thank you for this interesting consult.  I greatly enjoyed meeting ISAIAHS CHANCY and look forward to participating in their care.  A copy of this report was sent to the requesting provider on this date.  Electronically Signed: Lavonia Drafts, PA-C 02/07/2018, 10:50 AM   I spent a total of  40 Minutes    in face to face in clinical consultation, greater than 50% of which was counseling/coordinating care for percutaneous gastric tube placement

## 2018-02-07 NOTE — Progress Notes (Addendum)
LTAC consult obtained from MD.  Referral placed to both area [facility], per protocol.  Will plan to discuss LTAC hospital with family possibly tomorrow, as today pt not medically stable.  Will follow progress.    Reinaldo Raddle, RN, BSN  Trauma/Neuro ICU Case Manager [phone]

## 2018-02-08 ENCOUNTER — Inpatient Hospital Stay: Admission: RE | Admit: 2018-02-08 | Payer: Medicare Other | Source: Ambulatory Visit

## 2018-02-08 ENCOUNTER — Inpatient Hospital Stay (HOSPITAL_COMMUNITY): Payer: Medicare Other

## 2018-02-08 LAB — CBC WITH DIFFERENTIAL/PLATELET
Abs Immature Granulocytes: 0.1 10*3/uL (ref 0.0–0.1)
Basophils Absolute: 0.1 10*3/uL (ref 0.0–0.1)
Basophils Relative: 0 %
EOS ABS: 0.3 10*3/uL (ref 0.0–0.7)
EOS PCT: 2 %
HEMATOCRIT: 27.8 % — AB (ref 39.0–52.0)
Hemoglobin: 8.4 g/dL — ABNORMAL LOW (ref 13.0–17.0)
Immature Granulocytes: 1 %
Lymphocytes Relative: 6 %
Lymphs Abs: 0.9 10*3/uL (ref 0.7–4.0)
MCH: 25.1 pg — AB (ref 26.0–34.0)
MCHC: 30.2 g/dL (ref 30.0–36.0)
MCV: 83.2 fL (ref 78.0–100.0)
MONO ABS: 1.3 10*3/uL — AB (ref 0.1–1.0)
MONOS PCT: 9 %
Neutro Abs: 12.7 10*3/uL — ABNORMAL HIGH (ref 1.7–7.7)
Neutrophils Relative %: 82 %
Platelets: 448 10*3/uL — ABNORMAL HIGH (ref 150–400)
RBC: 3.34 MIL/uL — ABNORMAL LOW (ref 4.22–5.81)
RDW: 17.1 % — AB (ref 11.5–15.5)
WBC: 15.4 10*3/uL — ABNORMAL HIGH (ref 4.0–10.5)

## 2018-02-08 LAB — BASIC METABOLIC PANEL
Anion gap: 5 (ref 5–15)
BUN: 20 mg/dL (ref 6–20)
CALCIUM: 7.6 mg/dL — AB (ref 8.9–10.3)
CHLORIDE: 111 mmol/L (ref 101–111)
CO2: 21 mmol/L — AB (ref 22–32)
CREATININE: 0.76 mg/dL (ref 0.61–1.24)
GFR calc non Af Amer: >60 mL/min (ref >60)
Glucose, Bld: 116 mg/dL — ABNORMAL HIGH (ref 65–99)
Potassium: 3.3 mmol/L — ABNORMAL LOW (ref 3.5–5.1)
Sodium: 137 mmol/L (ref 135–145)

## 2018-02-08 LAB — CULTURE, RESPIRATORY: SPECIAL REQUESTS: NORMAL

## 2018-02-08 LAB — PHOSPHORUS: Phosphorus: 3 mg/dL (ref 2.5–4.6)

## 2018-02-08 LAB — GLUCOSE, CAPILLARY
GLUCOSE-CAPILLARY: 106 mg/dL — AB (ref 65–99)
GLUCOSE-CAPILLARY: 107 mg/dL — AB (ref 65–99)
GLUCOSE-CAPILLARY: 116 mg/dL — AB (ref 65–99)
GLUCOSE-CAPILLARY: 121 mg/dL — AB (ref 65–99)
Glucose-Capillary: 123 mg/dL — ABNORMAL HIGH (ref 65–99)
Glucose-Capillary: 122 mg/dL — ABNORMAL HIGH (ref 65–99)

## 2018-02-08 LAB — CULTURE, RESPIRATORY W GRAM STAIN

## 2018-02-08 LAB — MAGNESIUM: Magnesium: 1.7 mg/dL (ref 1.7–2.4)

## 2018-02-08 MED ORDER — POTASSIUM CHLORIDE 10 MEQ/100ML IV SOLN
10.0000 meq | INTRAVENOUS | Status: AC
  Administered 2018-02-08 (×3): 10 meq via INTRAVENOUS
  Filled 2018-02-08 (×3): qty 100

## 2018-02-08 MED ORDER — VITAL AF 1.2 CAL PO LIQD
1000.0000 mL | ORAL | Status: DC
  Administered 2018-02-08 – 2018-02-10 (×3): 1000 mL

## 2018-02-08 NOTE — Progress Notes (Signed)
Patient ID: Danny Krause, male   DOB: Apr 26, 1941, 77 y.o.   MRN: [id number]   Pt was tentatively scheduled for percutaneous gastric tube placement today.  New +sputum culture and wbc up from 11 to 15 T max 100.4  Will HOLD for now per Dr Anselm Pancoast Reschedule G tube to Mon 6/10  New orders in place

## 2018-02-08 NOTE — Progress Notes (Signed)
Nutrition Follow-up  DOCUMENTATION CODES:   Not applicable  INTERVENTION:   D/C Vital High Protein  Vital AF 1.2 @ 60 ml/hr (1440 ml/day) Provides: 1728 kcal, 108 grams protein, and 1167 ml free water.    NUTRITION DIAGNOSIS:   Inadequate oral intake related to inability to eat as evidenced by NPO status. Ongoing.   GOAL:   Patient will meet greater than or equal to 90% of their needs Met.   MONITOR:   Diet advancement, Weight trends, Labs, TF tolerance, Vent status, I & O's  ASSESSMENT:   Patient with PMH significant for right frontal CVA, COPD, CAD s/p CABG and AVR. Recently hospitalized in April for seizure and respiratory failure. Presents this admission with seizure in the setting of severe acidosis and acute respiratory failure requiring intubation.   Pt discussed during ICU rounds and with RN.  6/3 Cortrak placed 6/5 trach placed  +fevers, PEG on hold  Case management reviewing for LTACH placement   MV: 15.7 L/min Temp (24hrs), Avg:99.8 F (37.7 C), Min:98.1 F (36.7 C), Max:101.2 F (38.4 C)  Medications reviewed and include: pepcid, SSI, MVI, KCl 10 mEq x 3 Labs reviewed: K+ 3.3 (L)   TF: Vital High Protein @ 50 Provides: 1200 kcal, 105 grams protein, and 1003 ml free water.   Diet Order:   Diet Order           Diet NPO time specified Except for: Sips with Meds  Diet effective midnight          EDUCATION NEEDS:   Not appropriate for education at this time  Skin:  Skin Assessment: Reviewed RN Assessment  Last BM:  6/7  Height:   Ht Readings from Last 1 Encounters:  01/28/18 '5\' 5"'$  (1.651 m)    Weight:   Wt Readings from Last 1 Encounters:  01/31/18 147 lb 11.3 oz (67 kg)    Ideal Body Weight:  61.8 kg  BMI:  Body mass index is 24.58 kg/m.  Estimated Nutritional Needs:   Kcal:  1728  Protein:  90-110 grams  Fluid:  >1.5 L/day  Maylon Peppers RD, LDN, CNSC [phone] Pager [phone] After Hours Pager

## 2018-02-08 NOTE — Progress Notes (Signed)
Spoke with pt's daughter, [name], to discuss LTAC choice.  She prefers [facility] [employer].  Notified Barbette Reichmann, admissions coordinator with [facility], who states she will call daughter to answer any questions she may have.  Noted positive sputum culture, and plan for PEG on Monday, June 10.  Will plan for transfer to [facility] after PEG on Monday, pending medical stability.    Reinaldo Raddle, RN, BSN  Trauma/Neuro ICU Case Manager [phone]

## 2018-02-08 NOTE — Progress Notes (Addendum)
PULMONARY / CRITICAL CARE MEDICINE   Name: Danny Krause MRN: [id number] DOB: October 18, 1940    ADMISSION DATE:  01/24/2018 CONSULTATION DATE:  01/24/2018  REFERRING MD:  Maudie Mercury  CHIEF COMPLAINT:  Seizures  BRIEF SUMMARY:   77 y/o M who presented 5/23 after being found unresponsive at home with concern for seizure like activity.  He was intubated in the ER for hypercapnic respiratory failure.  He has a history of CVA with encephalomalacia and seizures (first dx 12/2017), on Keppra with questionable compliance.  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   On the floor 5/27, he developed new left-sided weakness and a CT angiogram was performed which showed no evidence of large vessel occlusion.  Neurology consulted.  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  In light of his extremely poor baseline respiratory function and prior CO2 retention during this admission only 1 mg of Ativan was administered on the floor after which there was still seizure activity on the EEG but the patient was profoundly somnolent.  It was elected elected to transfer him to ICU for intubation to allow deep sedation to burst suppression.  He required as much as 80 mcg of propofol,  And 20 mg/hr Versed for sedation to burst supression and neo-synephrine for BP support.    SUBJECTIVE:  Currently on pressure support ventilation  VITAL SIGNS: BP (!) 109/56   Pulse 79   Temp 98.1 F (36.7 C) (Oral)   Resp 17   Ht 5\' 5"  (1.651 m)   Wt 67 kg (147 lb 11.3 oz)   SpO2 100%   BMI 24.58 kg/m   HEMODYNAMICS:    VENTILATOR SETTINGS: Vent Mode: PSV;CPAP FiO2 (%):  [40 %] 40 % Set Rate:  [21 bmp] 21 bmp Vt Set:  [500 mL] 500 mL PEEP:  [5 cmH20] 5 cmH20 Pressure Support:  [10 cmH20] 10 cmH20 Plateau Pressure:  [14 cmH20-20 cmH20] 20 cmH20  INTAKE / OUTPUT: I/O last 3 completed shifts: In: 3061.6 [I.V.:176.3; NG/GT:1858.3; IV [id number]:1950] Out: 2465  [Urine:2465]  PHYSICAL EXAMINATION: General: Male currently on ventilator support pressure support, tube feedings on hold for PEG placement HEENT: Tracheostomy in place, feeding tube in place Neuro: Opens eyes but does not follow commands CV: Sounds regular currently sinus rhythm PULM: Decreased breath sounds in the bases, coarse rhonchi bilaterally DT:OIZT, non-tender, bsx4 active  Extremities: warm/dry, 2+ edema, venous stasis lower extremities noted Skin: no rashes or lesions    LABS:  BMET Recent Labs  Lab 02/06/18 0322 02/07/18 0351 02/08/18 0550  NA 140 139 137  K 3.9 3.4* 3.3*  CL 115* 109 111  CO2 22 22 21*  BUN 19 20 20   CREATININE 0.72 0.75 0.76  GLUCOSE 109* 128* 116*    Electrolytes Recent Labs  Lab 02/06/18 0322 02/07/18 0351 02/08/18 0550  CALCIUM 8.2* 8.3* 7.6*  MG 2.0 1.9 1.7  PHOS 2.9 3.5 3.0    CBC Recent Labs  Lab 02/06/18 0322 02/07/18 0351 02/08/18 0550  WBC 9.6 10.6* 15.4*  HGB 9.0* 9.1* 8.4*  HCT 30.4* 30.3* 27.8*  PLT 444* 457* 448*   Coag's Recent Labs  Lab 02/06/18 0322  INR 1.05    Sepsis Markers Recent Labs  Lab 02/02/18 0346  PROCALCITON 0.10   ABG Recent Labs  Lab 02/02/18 0933 02/04/18 0235 02/06/18 0408  PHART 7.345* 7.379 7.381  PCO2ART 39.5 34.8 36.7  PO2ART 98.0 91.0 116*   Liver Enzymes No results  for input(s): AST, ALT, ALKPHOS, BILITOT, ALBUMIN in the last 168 hours.  Cardiac Enzymes No results for input(s): TROPONINI, PROBNP in the last 168 hours.  Glucose Recent Labs  Lab 02/07/18 1135 02/07/18 1543 02/07/18 1956 02/07/18 2323 02/08/18 0425 02/08/18 0718  GLUCAP 164* 102* 123* 110* 116* 106*   Imaging Dg Chest Port 1 View  Result Date: 02/08/2018 CLINICAL DATA:  Respiratory failure. EXAM: PORTABLE CHEST 1 VIEW COMPARISON:  Radiograph of February 06, 2018. FINDINGS: The heart size and mediastinal contours are within normal limits. Both lungs are clear. Tracheostomy and feeding tubes are  unchanged in position. Left subclavian catheter is unchanged in position. Status post coronary artery bypass graft. No pneumothorax or pleural effusion is noted. The visualized skeletal structures are unremarkable. IMPRESSION: Stable support apparatus. No acute cardiopulmonary abnormality seen. Electronically Signed   By: Marijo Conception, M.D.   On: 02/08/2018 09:03   STUDIES:  EEG showed:  1. Moderate diffuse background slowing 2. Lateralized periodic discharges (LPDs) over the right frontotemporal region - consistent with possible epileptogenic focus. 3. Brief 5-second period of rhythmic theta activity over the right temporal region without evolution CTA Head / Neck 5/27 >> no intracranial or extracranial flow reducing stenosis or emergent large vessel occlusion  CT Head Perfusion 5/27 >> relative increased cerebral blood flow & cerebral blood volume in the right hemisphere as compared to the left, correlates with suspected status / epileptiform activity EEG 5/29 >> abnormal continuous EEG due to burst suppression with occasional right central LPD's. No ictal evolution.  Pattern consistent with a diffuse cerebral disturbance and a superimposed focal epileptogenic disturbance on the right.  LPD's could be representative of an injury pattern given their persistence despite AED treatment.   CULTURES: 02/07/2018 blood cultures x2>> 02/07/2018 sputum culture>> 02/07/2018 urine culture>>  ANTIBIOTICS: Zosyn 5/27 >> 02/01/2018 6/6 vanc> 6/6 zoysn>>  SIGNIFICANT EVENTS: 5/23  Admit, intubated with concern for seizure 5/24  Extubated  5/24  Neuro rec's continuing dual anticonvulsant therapy until non-compliance can be confirmed. 5/27  On floor, had left sided weakness felt to be Todd seizure, reintubated 5/30  Ongoing concern for seizure, remains suppressed  02/01/2018 heavy sedation for burst supression discontinued 6/4 not sedated, opens eyes, not following commands 02/06/2018 tracheostomy  placed  LINES/TUBES: L [place] TLC 5/27 >> ETT 5/27 >> 1 02/06/2018 Tracheostomy placed on 02/06/2018>>  DISCUSSION: 77 year old with a distant history of a right frontal CVA with encephalomalacia on his CT scan who was treated for status epilepticus with sedation to burst suppression.  He also has a history of O2 dependent chronic lung disease.  He has not yet emerged from deep sedation, and continues to require mechanical ventilation.  LTAC placement is being pursued.  ASSESSMENT / PLAN:  NEUROLOGIC A:   Seizure Disorder Hx CVA P:   Neuro is following This may be his new baseline mental status Medications per neurology    PULMONARY A: O2 Dependent COPD  Rule Out Aspiration  Respiratory Acidosis - corrected  VDRF P: Wean per protocol Currently on pressure support of 10 grading and tidal volume of 560 with a rate of 22 Suspect he may need LTAC placement, this is being pursued.  CARDIOVASCULAR A:  Hypertension  Hx AVR, CAD s/p CABG, multiple stents  Hypotension secondary to sedation P:  Continue current treatment  RENAL Recent Labs  Lab 02/06/18 0322 02/07/18 0351 02/08/18 0550  K 3.9 3.4* 3.3*    A:   Hypokalemia  P:   Daily  chemistries Replete electrolytes as needed  GASTROINTESTINAL A:   Moderate Nutritional Risk  P:   Currently n.p.o. Scheduled for PEG placement in  02/08/2018  HEMATOLOGIC  A:   Anemia - without bleeding, suspect of chronic disease P:  DVT prophylaxis Transfuse per protocol  INFECTIOUS A:   Spiked fever 02/07/2018 P: 02/07/2018 started on vancomycin and Zosyn Panculture on 02/07/2018  ENDOCRINE CBG (last 3)  Recent Labs    02/07/18 2323 02/08/18 0425 02/08/18 0718  GLUCAP 110* 116* 106*    No active issue  A:   Well-controlled sliding scale insulin  App cct 30 min   Richardson Landry Minor ACNP Maryanna Shape PCCM Pager [phone] till 1 pm If no answer page [phone] 02/08/2018, 10:47 AM  Attending Note:  77 year old male with  seizure history who was found unresponsive and seizing.  Underwent burst suppression and now seizure is under control.  On exam, he opens eyes and follows some commands with coarse BS diffusely.  I reviewed CXR myself, trach is in good position.  Discussed with PCCM-NP and RT.  Will continue PS trials for now, attempt TC if able.  Keep fluid even at this point.  Minimize sedation as able.  PEG to be placed with IR is ready.  PCCM will continue to follow.  The patient is critically ill with multiple organ systems failure and requires high complexity decision making for assessment and support, frequent evaluation and titration of therapies, application of advanced monitoring technologies and extensive interpretation of multiple databases.   Critical Care Time devoted to patient care services described in this note is  32  Minutes. This time reflects time of care of this signee Dr [name]. This critical care time does not reflect procedure time, or teaching time or supervisory time of PA/NP/Med student/Med Resident etc but could involve care discussion time.  Rush Farmer, M.D. [facility] Pulmonary/Critical Care Medicine. Pager: [phone]. After hours pager: [phone].

## 2018-02-08 NOTE — Clinical Social Work Note (Signed)
Clinical Social Worker continuing to follow patient and family for support.  Patient family did agree to tracheostomy and PEG - Tracheostomy has been placed but due to potential infection not yet a candidate for PEG placement.  RNCM to explore possibility of LTAC placement at discharge.  CSW remains available for support as needed.  Barbette Or, [place]

## 2018-02-09 ENCOUNTER — Inpatient Hospital Stay (HOSPITAL_COMMUNITY): Payer: Medicare Other

## 2018-02-09 LAB — BASIC METABOLIC PANEL
ANION GAP: 11 (ref 5–15)
BUN: 19 mg/dL (ref 6–20)
CALCIUM: 8.4 mg/dL — AB (ref 8.9–10.3)
CO2: 18 mmol/L — AB (ref 22–32)
CREATININE: 0.85 mg/dL (ref 0.61–1.24)
Chloride: 108 mmol/L (ref 101–111)
Glucose, Bld: 101 mg/dL — ABNORMAL HIGH (ref 65–99)
Potassium: 4.8 mmol/L (ref 3.5–5.1)
Sodium: 137 mmol/L (ref 135–145)

## 2018-02-09 LAB — MAGNESIUM: MAGNESIUM: 2.1 mg/dL (ref 1.7–2.4)

## 2018-02-09 LAB — GLUCOSE, CAPILLARY
GLUCOSE-CAPILLARY: 115 mg/dL — AB (ref 65–99)
Glucose-Capillary: 113 mg/dL — ABNORMAL HIGH (ref 65–99)
Glucose-Capillary: 113 mg/dL — ABNORMAL HIGH (ref 65–99)
Glucose-Capillary: 135 mg/dL — ABNORMAL HIGH (ref 65–99)
Glucose-Capillary: 102 mg/dL — ABNORMAL HIGH (ref 65–99)
Glucose-Capillary: 122 mg/dL — ABNORMAL HIGH (ref 65–99)

## 2018-02-09 LAB — PHOSPHORUS: PHOSPHORUS: 3.3 mg/dL (ref 2.5–4.6)

## 2018-02-09 MED ORDER — PHENYTOIN 50 MG PO CHEW
150.0000 mg | CHEWABLE_TABLET | Freq: Three times a day (TID) | ORAL | Status: DC
  Administered 2018-02-09 – 2018-02-10 (×4): 150 mg
  Filled 2018-02-09 (×6): qty 3

## 2018-02-09 MED ORDER — LEVETIRACETAM 100 MG/ML PO SOLN
1500.0000 mg | Freq: Two times a day (BID) | ORAL | Status: DC
  Administered 2018-02-09 – 2018-02-10 (×3): 1500 mg
  Filled 2018-02-09 (×3): qty 15

## 2018-02-09 MED ORDER — DOCUSATE SODIUM 50 MG/5ML PO LIQD
100.0000 mg | Freq: Two times a day (BID) | ORAL | Status: DC
  Administered 2018-02-10: 100 mg
  Filled 2018-02-09 (×2): qty 10

## 2018-02-09 MED ORDER — POLYETHYLENE GLYCOL 3350 17 G PO PACK
17.0000 g | PACK | Freq: Two times a day (BID) | ORAL | Status: DC
  Filled 2018-02-09 (×2): qty 1

## 2018-02-09 MED ORDER — MAGNESIUM CITRATE PO SOLN
1.0000 | Freq: Once | ORAL | Status: AC | PRN
  Administered 2018-02-09: 1
  Filled 2018-02-09: qty 296

## 2018-02-09 MED ORDER — LACOSAMIDE 200 MG PO TABS
200.0000 mg | ORAL_TABLET | Freq: Two times a day (BID) | ORAL | Status: DC
  Administered 2018-02-09 – 2018-02-10 (×3): 200 mg
  Filled 2018-02-09 (×3): qty 1

## 2018-02-09 MED ORDER — LACOSAMIDE 10 MG/ML PO SOLN: 200.0000 mg | Freq: Two times a day (BID) | ORAL | Status: DC

## 2018-02-09 NOTE — Progress Notes (Addendum)
Subjective: Patient in bed, somnolent but does arouse to sternal rubs and calling his name.  Patient currently is aphasic and intermittently follows commands.  He of sedation altogether, Precedex was DC'd last night.  Patient with a trach in place with intact gag reflex trying to cough out sputum. Patient currently does not appear to be in distress  Exam: Vitals:   02/09/18 0800 02/09/18 0817  BP: (!) 150/75 (!) 150/75  Pulse: 89 88  Resp: (!) 30 (!) 27  Temp: 100 F (37.8 C)   SpO2: 99% 100%    Physical Exam  HEENT-  Normocephalic, no lesions, without obvious abnormality.  Normal external eye and conjunctiva.  Trach in place Cardiovascular- S1-S2 audible, pulses palpable throughout   Lungs coarse rhonchi bilaterally, she attempted to cough with trach in place with white sputum. Abdomen- All 4 quadrants palpated and nontender Musculoskeletal-+2 pitting edema bilateral lower extremities, patient able to move hands. skin-dry  Neuro:  Mental Status: Patient is somnolent but does wake up with chest rubs.  Patient is trying to mouth words, but not audible.  Patient intermittently follows commands.  Patient currently aphasic. Cranial Nerves: Corneal reflex present, patient does blink to threat and does track.  Pupils are equal round and reactive to light.  Patient not able to follow commands with extraocular motions  V,VII: Face is symmetric, and grimaces equally. VIII: hearing intact to voice IX,X: Patient with intact gag reflex and chronic cough out sputum XI: bilateral shoulder shrug XII: midline tongue extension Motor: Patient with generalized weakness of bilateral upper and lower extremities.  Patient able to squeeze hands on command, patient does move bilateral lower extremities for withdrawal from stimulus Tone and bulk:normal tone throughout; no atrophy noted Sensory: Patient does grimace and withdrawal with harsh stimulus Deep Tendon Reflexes: 2+ and symmetric in  patella Plantars: Right: downgoing   Left: downgoing Cerebellar: To accurately assess patient minimally following commands.     Medications:  . aspirin  325 mg Per Tube Daily  . bethanechol  5 mg Per Tube TID  . chlorhexidine gluconate (MEDLINE KIT)  15 mL Mouth Rinse BID  . Chlorhexidine Gluconate Cloth  6 each Topical Daily  . famotidine  20 mg Per Tube BID  . heparin  5,000 Units Subcutaneous Q8H  . insulin aspart  0-9 Units Subcutaneous Q4H  . irbesartan  300 mg Per Tube Daily  . mouth rinse  15 mL Mouth Rinse 10 times per day  . multivitamin  15 mL Per Tube Daily  . sodium chloride flush  10-40 mL Intracatheter Q12H  . 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 He had an increased in LPDs with stopping sedation and so he was started on topiramate and sedation was resumed. Propofol was DC'd several days ago and Precedex has since been stopped as of last night.  Patient continues to remain seizure-free.  On assessment today (6/8) patient is intermittently awake, minimally follows commands, does move hands to command.  May be at his new baseline mentation.    Recommendations: 1.  Continue current antiepileptic regimen Keppra 1.5 g twice daily, Dilantin 150 mg 3 times daily, Vimpat 150 mg twice daily, topiramate 200 mg twice daily 2.  Continue to keep off sedation, patient likely at his new baseline mentation 3.  Neurology will sign off, please call with further questions.   Letha Cape DNP Neuro-hospitalist Team [phone]  02/09/2018, 10:42 AM   Electronically signed: Dr. Kerney Elbe

## 2018-02-09 NOTE — Progress Notes (Signed)
77 year old male with COPD on nocturnal oxygen admitted 5/23 with seizures, initially admitted for acute hypercarbic respiratory failure.He has a history of CVA with encephalomalacia and seizures (first dx 12/2017), on Keppra   On 5/27 he developed subclinical status and required deep sedation for burst suppression until 5/31, underwent tracheostomy on 6/5  He remains chronically critically ill, is tolerating trach collar with moderate secretions clear white, S1-S2 normal, on 40% oxygen, does not follow commands but opens eyes spontaneously.  Labs show normal electrolytes, mild leukocytosis. Respiratory culture shows Moraxella beta-lactamase positive.  Impression/plan Acute respiratory failure-he is tolerating trach collar and goal will be to continue 24 hours if tolerated. Will get speech involved but he is not ready for Passy-Muir valve yet due to increased secretions  Tracheobronchitis with Moraxella-discontinue vancomycin, can simplify from Zosyn to ceftriaxone soon  Rest per neurology and triad Can transfer to stepdown unit  Kara Mead MD. [facility].  Pulmonary & Critical care Pager [phone] If no response call [phone]   02/09/2018

## 2018-02-09 NOTE — Progress Notes (Signed)
Pt placed back on full vent support due to desat to 77% on ATC.

## 2018-02-09 NOTE — Progress Notes (Signed)
   02/09/18 1000  SLP Visit Information  SLP Received On 02/09/18  Reason Eval/Treat Not Completed Medical issues which prohibited therapy; RN reports increased RR and secretions this date. SLP to continue to monitor for readiness for PMV candidacy    Jannette Spanner MA, [place] [facility] Speech Language Pathologist

## 2018-02-09 NOTE — Progress Notes (Signed)
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  He was intubated in the ER for hypercapnic respiratory failure.     While in the hospital on 5/27 he developed a new left-sided weakness but a CT angio showed no evidence of large vessel occlusion.  EEG revealed subclinical status.  He was transferred back to the ICU for intubation to allow burst suppression.  He required as much as 80 mcg of propofol and 20 mg/hr Versed for burst supression, and neo-synephrine for BP support.    Significant Events: 5/23  Admit, intubated with concern for seizure 5/24  Extubated  5/24  Neuro rec's continuing dual anticonvulsant therapy until non-compliance can be confirmed 5/27  On floor developed acute left sided weakness > reintubated 5/30  Ongoing concern for seizure, remains suppressed  5/31  heavy sedation for burst supression discontinued 6/4 not sedated, opens eyes, not following commands 6/5 tracheostomy placed 6/8 TRH assumed care   Subjective: Resting comfortably on TC.  No apparent resp distress.  No evidence of uncontrolled pain.    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 rate (!) 22, height _0  (1.651 m), weight 67 kg (147 lb 11.3 oz), SpO2 99 %.  Intake/Output Summary (Last 24 hours) at 02/09/2018 1701 Last data filed at 02/09/2018 1500 Gross per 24 hour  Intake 2263 ml  Output 1555 ml  Net 708 ml   Filed Weights   01/26/18 0400 01/28/18 2000 01/31/18 0400  Weight: 66.3 kg (146 lb 2.6 oz) 61.9 kg (136 lb 7.4 oz) 67 kg (147 lb 11.3 oz)    Examination: General: No acute respiratory distress Lungs: Clear to auscultation bilaterally without wheezes or crackles Cardiovascular: Regular rate and rhythm without murmur gallop or rub normal S1 and S2 Abdomen: Nontender, nondistended, soft, bowel sounds positive, no rebound, no ascites, no appreciable mass Extremities: No significant cyanosis, clubbing, or edema bilateral lower extremities  CBC: Recent Labs  Lab 02/04/18 0547  02/06/18 0322 02/07/18 0351 02/08/18 0550  WBC 9.4   < > 9.6 10.6* 15.4*  NEUTROABS 6.1  --   --   --  12.7*  HGB 9.0*   < > 9.0* 9.1* 8.4*  HCT 30.5*   < > 30.4* 30.3* 27.8*  MCV 82.2   < > 83.3 83.2 83.2  PLT 354   < > 444* 457* 448*   < > = values in this interval not displayed.   Basic Metabolic Panel: Recent Labs  Lab 02/07/18 0351 02/08/18 0550 02/09/18 0757  NA 139 137 137  K 3.4* 3.3* 4.8  CL 109 111 108  CO2 22 21* 18*  GLUCOSE 128* 116* 101*  BUN _0 CREATININE 0.75 0.76 0.85  CALCIUM 8.3* 7.6* 8.4*  MG 1.9 1.7 2.1  PHOS 3.5 3.0  3.3   GFR: Estimated Creatinine Clearance: 63.3 mL/min (by C-G formula based on SCr of 0.85 mg/dL).  Liver Function Tests: No results for input(s): AST, ALT, ALKPHOS, BILITOT, PROT, ALBUMIN in the last 168 hours. No results for input(s): LIPASE, AMYLASE in the last 168 hours. No results for input(s): AMMONIA in the last 168 hours.  Coagulation Profile: Recent Labs  Lab 02/06/18 0322  INR 1.05    HbA1C: Hgb A1c MFr Bld  Date/Time Value Ref Range Status  02/07/2018 01:40 PM 5.9 (H) 4.8 - 5.6 % Final    Comment:    (NOTE) Pre diabetes:          5.7%-6.4% Diabetes:              >6.4% Glycemic control for   <7.0% adults with diabetes   07/03/2017 10:59 AM 5.9 (H) 4.8 - 5.6 % Final    Comment:    (NOTE) Pre diabetes:          5.7%-6.4% Diabetes:              >6.4% Glycemic control for   <7.0% adults with diabetes     CBG: Recent Labs  Lab 02/08/18 2350 02/09/18 0441 02/09/18 0722 02/09/18 1117 02/09/18 1523  GLUCAP 121* 113* 113* 135* 102*    Recent Results (from the past 240 hour(s))  Culture, blood (Routine X 2) w Reflex to ID Panel     Status: None (Preliminary result)   Collection Time: 02/07/18 11:04 AM  Result Value Ref Range Status   Specimen Description BLOOD RIGHT HAND  Final   Special Requests   Final    BOTTLES DRAWN AEROBIC AND ANAEROBIC Blood Culture adequate volume   Culture   Final    NO GROWTH 2 DAYS Performed at [facility], [place] [address] [place], [place] [address]    Report Status PENDING  Incomplete  Culture, blood (Routine X 2) w Reflex to ID Panel     Status: None (Preliminary result)   Collection Time: 02/07/18 11:29 AM  Result Value Ref Range Status   Specimen Description BLOOD RIGHT HAND  Final   Special Requests   Final    BOTTLES DRAWN AEROBIC AND ANAEROBIC Blood Culture adequate volume   Culture   Final    NO GROWTH 2 DAYS Performed at [facility], [place] [address], [place], [place] [address]    Report Status  PENDING  Incomplete  Culture, respiratory (NON-Expectorated)     Status: None   Collection Time: 02/07/18 12:05 PM  Result Value Ref Range Status   Specimen Description TRACHEAL ASPIRATE  Final   Special Requests Normal  Final   Gram Stain   Final    RARE WBC PRESENT, PREDOMINANTLY PMN RARE GRAM POSITIVE COCCI    Culture  Final    FEW MORAXELLA CATARRHALIS(BRANHAMELLA) BETA LACTAMASE POSITIVE Performed at [facility], [place] [address], [place], [place] [address]    Report Status 02/08/2018 FINAL  Final     Scheduled Meds: . aspirin  325 mg Per Tube Daily  . bethanechol  5 mg Per Tube TID  . chlorhexidine gluconate (MEDLINE KIT)  15 mL Mouth Rinse BID  . Chlorhexidine Gluconate Cloth  6 each Topical Daily  . famotidine  20 mg Per Tube BID  . heparin  5,000 Units Subcutaneous Q8H  . insulin aspart  0-9 Units Subcutaneous Q4H  . irbesartan  300 mg Per Tube Daily  . mouth rinse  15 mL Mouth Rinse 10 times per day  . multivitamin  15 mL Per Tube Daily  . sodium chloride flush  10-40 mL Intracatheter Q12H  . topiramate  200 mg Per Tube BID     LOS: 15 days   Cherene Altes, MD [facility] Office  [phone] Pager - Text Page per Amion as per below:  On-Call/Text Page:      [email or web address]      password [email or web address]  If 7PM-7AM, please contact night-coverage [email or web address] Password [email or web address] 02/09/2018, 5:01 PM

## 2018-02-10 LAB — CBC
HEMATOCRIT: 31.4 % — AB (ref 39.0–52.0)
Hemoglobin: 9.2 g/dL — ABNORMAL LOW (ref 13.0–17.0)
MCH: 24.8 pg — ABNORMAL LOW (ref 26.0–34.0)
MCHC: 29.3 g/dL — AB (ref 30.0–36.0)
MCV: 84.6 fL (ref 78.0–100.0)
Platelets: 514 10*3/uL — ABNORMAL HIGH (ref 150–400)
RBC: 3.71 MIL/uL — ABNORMAL LOW (ref 4.22–5.81)
RDW: 17.4 % — AB (ref 11.5–15.5)
WBC: 11.6 10*3/uL — ABNORMAL HIGH (ref 4.0–10.5)

## 2018-02-10 LAB — COMPREHENSIVE METABOLIC PANEL
ALK PHOS: 97 U/L (ref 38–126)
ALT: 34 U/L (ref 17–63)
AST: 30 U/L (ref 15–41)
Albumin: 2.3 g/dL — ABNORMAL LOW (ref 3.5–5.0)
Anion gap: 6 (ref 5–15)
BILIRUBIN TOTAL: 0.5 mg/dL (ref 0.3–1.2)
BUN: 16 mg/dL (ref 6–20)
CALCIUM: 8.3 mg/dL — AB (ref 8.9–10.3)
CO2: 24 mmol/L (ref 22–32)
CREATININE: 0.78 mg/dL (ref 0.61–1.24)
Chloride: 109 mmol/L (ref 101–111)
GFR calc non Af Amer: >60 mL/min (ref >60)
GLUCOSE: 120 mg/dL — AB (ref 65–99)
Potassium: 3.5 mmol/L (ref 3.5–5.1)
SODIUM: 139 mmol/L (ref 135–145)
TOTAL PROTEIN: 5.9 g/dL — AB (ref 6.5–8.1)

## 2018-02-10 LAB — GLUCOSE, CAPILLARY
GLUCOSE-CAPILLARY: 112 mg/dL — AB (ref 65–99)
GLUCOSE-CAPILLARY: 113 mg/dL — AB (ref 65–99)
GLUCOSE-CAPILLARY: 119 mg/dL — AB (ref 65–99)
GLUCOSE-CAPILLARY: 123 mg/dL — AB (ref 65–99)
Glucose-Capillary: 113 mg/dL — ABNORMAL HIGH (ref 65–99)
Glucose-Capillary: 119 mg/dL — ABNORMAL HIGH (ref 65–99)

## 2018-02-10 MED ORDER — SIMETHICONE 40 MG/0.6ML PO SUSP
80.0000 mg | Freq: Four times a day (QID) | ORAL | Status: DC
  Administered 2018-02-10: 80 mg
  Filled 2018-02-10 (×6): qty 1.2

## 2018-02-10 MED ORDER — SODIUM CHLORIDE 0.9 % IV SOLN
1.0000 g | INTRAVENOUS | Status: DC
  Administered 2018-02-10 – 2018-02-11 (×2): 1 g via INTRAVENOUS
  Filled 2018-02-10 (×2): qty 10

## 2018-02-10 MED ORDER — ALPRAZOLAM 0.25 MG PO TABS
0.2500 mg | ORAL_TABLET | Freq: Three times a day (TID) | ORAL | Status: DC | PRN
  Administered 2018-02-10: 0.5 mg
  Administered 2018-02-11: 0.25 mg
  Administered 2018-02-11: 0.5 mg
  Filled 2018-02-10 (×3): qty 2

## 2018-02-10 MED ORDER — POLYETHYLENE GLYCOL 3350 17 G PO PACK: 17.0000 g | PACK | Freq: Every day | ORAL | Status: DC

## 2018-02-10 NOTE — Progress Notes (Signed)
[place] TEAM 1 - Stepdown/ICU TEAM  Danny Krause  ZOX:[id number] DOB: 08-May-1941 DOA: 01/24/2018 PCP: Jani Gravel, MD    Brief Narrative:  77 y/o M with a history of COPD on O2 QHS and CVA with encephalomalacia and seizures (first dx 12/2017) on Keppra with questionable compliance who presented 5/23 after being found unresponsive at home with concern for seizure activity.  He was intubated in the ER for hypercapnic respiratory failure.     While in the hospital on 5/27 he developed new left-sided weakness but a CT angio showed no evidence of large vessel occlusion.  EEG revealed subclinical status.  He was transferred back to the ICU for intubation to allow burst suppression.  He required as much as 80 mcg of propofol and 20 mg/hr Versed for burst supression, and neo-synephrine for BP support.    Significant Events: 5/23  Admit, intubated with concern for seizure 5/24  Extubated  5/24  Neuro rec's continuing dual anticonvulsant therapy until non-compliance can be confirmed 5/27  On floor developed acute left sided weakness > reintubated 5/30  Ongoing concern for seizure, remains suppressed  5/31  heavy sedation for burst supression discontinued 6/4 not sedated, opens eyes, not following commands 6/5 tracheostomy placed 6/8 TRH assumed care   Subjective: Much more alert and interactive today.  Having frequent loose stools per RN.  Having frequent episodes of anxiety per RN.  No acute distress at the time of my visit.    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  S/p trach - on TC trials per PCCM - for LTACH placement for long term weaning trials   Encephalopathy Currently felt to be at his new baseline mental status - attempt to  manage anxiety w/o causing excessive somnolence   O2 Dependent COPD  Compensated at this time   Hypertension  BP well controlled   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 rate (!) 32, height '5\' 5"'$  (1.651 m), weight 67 kg (147 lb 11.3 oz), SpO2 99 %.  Intake/Output Summary (Last 24 hours) at 02/10/2018 1734 Last data filed at 02/10/2018 1600 Gross per 24 hour  Intake 1770 ml  Output 2370 ml  Net -600 ml   Filed Weights   01/26/18 0400 01/28/18 2000 01/31/18 0400  Weight: 66.3 kg (146 lb 2.6 oz) 61.9 kg (136 lb 7.4 oz) 67 kg (147 lb 11.3 oz)    Examination: General: No acute respiratory distress - more alert Lungs: Clear to auscultation B - some upper airways sounds appreciable / trach congestion Cardiovascular: Regular rate and rhythm without murmur  Abdomen: Nontender, modestly distended, BS+, no mass, no rebound  Extremities: No significant  edema bilateral lower extremities  CBC: Recent Labs  Lab 02/04/18 0547  02/07/18 0351 02/08/18 0550 02/10/18 0831  WBC 9.4   < > 10.6* 15.4* 11.6*  NEUTROABS 6.1  --   --  12.7*  --   HGB 9.0*   < > 9.1* 8.4* 9.2*  HCT 30.5*   < > 30.3* 27.8* 31.4*  MCV 82.2   < > 83.2 83.2 84.6  PLT 354   < > 457* 448* 514*   < > = values in this interval not displayed.   Basic Metabolic Panel: Recent Labs  Lab 02/07/18 0351 02/08/18 0550 02/09/18 0757 02/10/18 0831  NA 139 137 137 139  K 3.4* 3.3*  4.8 3.5  CL 109 111 108 109  CO2 22 21* 18* 24  GLUCOSE 128* 116* 101* 120*  BUN '20 20 19 16  '$ CREATININE 0.75 0.76 0.85 0.78  CALCIUM 8.3* 7.6* 8.4* 8.3*  MG 1.9 1.7 2.1  --   PHOS 3.5 3.0 3.3  --    GFR: Estimated Creatinine Clearance: 67.3 mL/min (by C-G formula based on SCr of 0.78 mg/dL).  Liver Function Tests: Recent Labs  Lab 02/10/18 0831  AST 30  ALT 34  ALKPHOS 97  BILITOT 0.5  PROT 5.9*  ALBUMIN 2.3*    Coagulation Profile: Recent Labs  Lab 02/06/18 0322  INR 1.05    HbA1C: Hgb A1c MFr Bld  Date/Time Value Ref Range Status  02/07/2018 01:40 PM 5.9 (H) 4.8 - 5.6 % Final    Comment:    (NOTE) Pre diabetes:          5.7%-6.4% Diabetes:              >6.4% Glycemic control for   <7.0% adults with diabetes   07/03/2017 10:59 AM 5.9 (H) 4.8 - 5.6 % Final    Comment:    (NOTE) Pre diabetes:          5.7%-6.4% Diabetes:              >6.4% Glycemic control for   <7.0% adults with diabetes     CBG: Recent Labs  Lab 02/09/18 2331 02/10/18 0326 02/10/18 0718 02/10/18 1241 02/10/18 1601  GLUCAP 115* 113* 112* 119* 113*    Recent Results (from the past 240 hour(s))  Culture, blood (Routine X 2) w Reflex to ID Panel     Status: None (Preliminary result)   Collection Time: 02/07/18 11:04 AM  Result Value Ref Range Status   Specimen Description BLOOD RIGHT HAND  Final   Special Requests   Final    BOTTLES DRAWN AEROBIC AND ANAEROBIC Blood Culture adequate volume   Culture   Final    NO GROWTH 3 DAYS Performed at [facility], [place] [address] [place], [place] [address]    Report Status PENDING  Incomplete  Culture, blood (Routine X 2) w Reflex to ID Panel     Status: None (Preliminary result)   Collection Time: 02/07/18 11:29 AM  Result Value Ref Range Status   Specimen Description BLOOD RIGHT HAND  Final   Special Requests   Final    BOTTLES DRAWN AEROBIC AND ANAEROBIC Blood Culture adequate volume   Culture   Final    NO GROWTH 3  DAYS Performed at [facility], [place] [address] [place], [place] [address]    Report Status PENDING  Incomplete  Culture, respiratory (NON-Expectorated)     Status: None   Collection Time: 02/07/18 12:05  PM  Result Value Ref Range Status   Specimen Description TRACHEAL ASPIRATE  Final   Special Requests Normal  Final   Gram Stain   Final    RARE WBC PRESENT, PREDOMINANTLY PMN RARE GRAM POSITIVE COCCI    Culture   Final    FEW MORAXELLA CATARRHALIS(BRANHAMELLA) BETA LACTAMASE POSITIVE Performed at [facility], [address], [place], [place] [address]    Report Status 02/08/2018 FINAL  Final     Scheduled Meds: . aspirin  325 mg Per Tube Daily  . bethanechol  5 mg Per Tube TID  . chlorhexidine gluconate (MEDLINE KIT)  15 mL Mouth Rinse BID  . Chlorhexidine Gluconate Cloth  6 each Topical Daily  . docusate  100 mg Per Tube BID  . famotidine  20 mg Per Tube BID  . heparin  5,000 Units Subcutaneous Q8H  . insulin aspart  0-9 Units Subcutaneous Q4H  . irbesartan  300 mg Per Tube Daily  . lacosamide  200 mg Per Tube BID  . levETIRAcetam  1,500 mg Per Tube BID  . mouth rinse  15 mL Mouth Rinse 10 times per day  . multivitamin  15 mL Per Tube Daily  . phenytoin  150 mg Per Tube TID  . polyethylene glycol  17 g Per Tube BID  . sodium chloride flush  10-40 mL Intracatheter Q12H  . topiramate  200 mg Per Tube BID     LOS: 16 days   Cherene Altes, MD [facility] Office  [phone] Pager - Text Page per Amion as per below:  On-Call/Text Page:      [email or web address]      password [email or web address]  If 7PM-7AM, please contact night-coverage [email or web address] Password [facility] 02/10/2018, 5:34 PM

## 2018-02-10 NOTE — Progress Notes (Signed)
77 year old male with COPD on nocturnal oxygen admitted 5/23 with seizures, initially admitted for acute hypercarbic respiratory failure.He has a history of CVA with encephalomalacia and seizures (first dx 12/2017), on Keppra   On 5/27 he developed subclinical status and required deep sedation for burst suppression until 5/31, underwent tracheostomy on 6/5  He continues to have copious secretions but did tolerate trach collar in the daytime. On exam, eyes open, does not follow commands, on trach collar, S1-S2 normal, afebrile, appears weak and deconditioned.  Labs show mild leukocytosis -improved from prior.  Impression/plan Acute respiratory failure-continue trach collar as tolerated Hold off on speech valve until secretions decrease  Tracheobronchitis with Moraxella-changed to ceftriaxone, discontinue Zosyn.  PCCM will follow intermittently for trach care, can transfer to stepdown unit  Kara Mead MD. [facility]. [place] [facility] Pager [phone] If no response call [phone]   02/10/2018

## 2018-02-11 ENCOUNTER — Inpatient Hospital Stay (HOSPITAL_COMMUNITY): Payer: Medicare Other

## 2018-02-11 ENCOUNTER — Other Ambulatory Visit (HOSPITAL_COMMUNITY): Payer: Medicare Other

## 2018-02-11 ENCOUNTER — Inpatient Hospital Stay
Admission: RE | Admit: 2018-02-11 | Discharge: 2018-03-04 | Disposition: A | Discharge status: Home / Self Care | Payer: Medicare Other | Source: Other Acute Inpatient Hospital | Attending: Internal Medicine | Admitting: Internal Medicine

## 2018-02-11 ENCOUNTER — Institutional Professional Consult (permissible substitution) (HOSPITAL_COMMUNITY): Payer: Medicare Other

## 2018-02-11 DIAGNOSIS — J9601 Acute respiratory failure with hypoxia: Secondary | ICD-10-CM | POA: Diagnosis present

## 2018-02-11 DIAGNOSIS — Z430 Encounter for attention to tracheostomy: Secondary | ICD-10-CM | POA: Diagnosis not present

## 2018-02-11 DIAGNOSIS — E43 Unspecified severe protein-calorie malnutrition: Secondary | ICD-10-CM | POA: Diagnosis present

## 2018-02-11 DIAGNOSIS — Z8674 Personal history of sudden cardiac arrest: Secondary | ICD-10-CM | POA: Diagnosis not present

## 2018-02-11 DIAGNOSIS — J449 Chronic obstructive pulmonary disease, unspecified: Secondary | ICD-10-CM | POA: Diagnosis not present

## 2018-02-11 DIAGNOSIS — J9621 Acute and chronic respiratory failure with hypoxia: Secondary | ICD-10-CM | POA: Diagnosis present

## 2018-02-11 DIAGNOSIS — J40 Bronchitis, not specified as acute or chronic: Secondary | ICD-10-CM | POA: Diagnosis not present

## 2018-02-11 DIAGNOSIS — Z953 Presence of xenogenic heart valve: Secondary | ICD-10-CM | POA: Diagnosis not present

## 2018-02-11 DIAGNOSIS — G40909 Epilepsy, unspecified, not intractable, without status epilepticus: Secondary | ICD-10-CM | POA: Diagnosis present

## 2018-02-11 DIAGNOSIS — Z4682 Encounter for fitting and adjustment of non-vascular catheter: Secondary | ICD-10-CM | POA: Diagnosis not present

## 2018-02-11 DIAGNOSIS — L89150 Pressure ulcer of sacral region, unstageable: Secondary | ICD-10-CM | POA: Diagnosis present

## 2018-02-11 DIAGNOSIS — R911 Solitary pulmonary nodule: Secondary | ICD-10-CM | POA: Diagnosis not present

## 2018-02-11 DIAGNOSIS — J9690 Respiratory failure, unspecified, unspecified whether with hypoxia or hypercapnia: Secondary | ICD-10-CM | POA: Diagnosis not present

## 2018-02-11 DIAGNOSIS — R1310 Dysphagia, unspecified: Secondary | ICD-10-CM | POA: Diagnosis present

## 2018-02-11 DIAGNOSIS — E46 Unspecified protein-calorie malnutrition: Secondary | ICD-10-CM | POA: Diagnosis not present

## 2018-02-11 DIAGNOSIS — G9349 Other encephalopathy: Secondary | ICD-10-CM | POA: Diagnosis not present

## 2018-02-11 DIAGNOSIS — J9600 Acute respiratory failure, unspecified whether with hypoxia or hypercapnia: Secondary | ICD-10-CM | POA: Diagnosis not present

## 2018-02-11 DIAGNOSIS — R531 Weakness: Secondary | ICD-10-CM | POA: Diagnosis present

## 2018-02-11 DIAGNOSIS — Z9911 Dependence on respirator [ventilator] status: Secondary | ICD-10-CM | POA: Diagnosis not present

## 2018-02-11 DIAGNOSIS — G9340 Encephalopathy, unspecified: Secondary | ICD-10-CM | POA: Diagnosis present

## 2018-02-11 DIAGNOSIS — J9602 Acute respiratory failure with hypercapnia: Principal | ICD-10-CM

## 2018-02-11 DIAGNOSIS — J440 Chronic obstructive pulmonary disease with acute lower respiratory infection: Secondary | ICD-10-CM | POA: Diagnosis present

## 2018-02-11 DIAGNOSIS — G40901 Epilepsy, unspecified, not intractable, with status epilepticus: Secondary | ICD-10-CM | POA: Diagnosis present

## 2018-02-11 DIAGNOSIS — J208 Acute bronchitis due to other specified organisms: Secondary | ICD-10-CM | POA: Diagnosis present

## 2018-02-11 DIAGNOSIS — Z951 Presence of aortocoronary bypass graft: Secondary | ICD-10-CM | POA: Diagnosis not present

## 2018-02-11 DIAGNOSIS — Z9981 Dependence on supplemental oxygen: Secondary | ICD-10-CM | POA: Diagnosis not present

## 2018-02-11 DIAGNOSIS — I10 Essential (primary) hypertension: Secondary | ICD-10-CM | POA: Diagnosis present

## 2018-02-11 DIAGNOSIS — F329 Major depressive disorder, single episode, unspecified: Secondary | ICD-10-CM | POA: Diagnosis present

## 2018-02-11 DIAGNOSIS — J209 Acute bronchitis, unspecified: Secondary | ICD-10-CM | POA: Diagnosis present

## 2018-02-11 DIAGNOSIS — Z4659 Encounter for fitting and adjustment of other gastrointestinal appliance and device: Secondary | ICD-10-CM

## 2018-02-11 DIAGNOSIS — G40301 Generalized idiopathic epilepsy and epileptic syndromes, not intractable, with status epilepticus: Secondary | ICD-10-CM | POA: Diagnosis not present

## 2018-02-11 DIAGNOSIS — Z931 Gastrostomy status: Secondary | ICD-10-CM

## 2018-02-11 DIAGNOSIS — Z8673 Personal history of transient ischemic attack (TIA), and cerebral infarction without residual deficits: Secondary | ICD-10-CM | POA: Diagnosis not present

## 2018-02-11 DIAGNOSIS — R569 Unspecified convulsions: Secondary | ICD-10-CM | POA: Diagnosis not present

## 2018-02-11 DIAGNOSIS — I2510 Atherosclerotic heart disease of native coronary artery without angina pectoris: Secondary | ICD-10-CM | POA: Diagnosis present

## 2018-02-11 DIAGNOSIS — Z6826 Body mass index (BMI) 26.0-26.9, adult: Secondary | ICD-10-CM | POA: Diagnosis not present

## 2018-02-11 HISTORY — DX: Other encephalopathy: G93.49

## 2018-02-11 LAB — BLOOD GAS, ARTERIAL
ACID-BASE EXCESS: 0.6 mmol/L (ref 0.0–2.0)
BICARBONATE: 24.5 mmol/L (ref 20.0–28.0)
FIO2: 28
O2 SAT: 96.7 %
Patient temperature: 98.2
pCO2 arterial: 37.9 mmHg (ref 32.0–48.0)
pH, Arterial: 7.425 (ref 7.350–7.450)
pO2, Arterial: 85.9 mmHg (ref 83.0–108.0)

## 2018-02-11 LAB — COMPREHENSIVE METABOLIC PANEL
ALT: 28 U/L (ref 17–63)
ANION GAP: 10 (ref 5–15)
AST: 25 U/L (ref 15–41)
Albumin: 2.5 g/dL — ABNORMAL LOW (ref 3.5–5.0)
Alkaline Phosphatase: 100 U/L (ref 38–126)
BUN: 13 mg/dL (ref 6–20)
CHLORIDE: 108 mmol/L (ref 101–111)
CO2: 24 mmol/L (ref 22–32)
Calcium: 8.8 mg/dL — ABNORMAL LOW (ref 8.9–10.3)
Creatinine, Ser: 0.78 mg/dL (ref 0.61–1.24)
GFR calc Af Amer: >60 mL/min (ref >60)
Glucose, Bld: 106 mg/dL — ABNORMAL HIGH (ref 65–99)
POTASSIUM: 3.4 mmol/L — AB (ref 3.5–5.1)
Sodium: 142 mmol/L (ref 135–145)
Total Bilirubin: 0.6 mg/dL (ref 0.3–1.2)
Total Protein: 6 g/dL — ABNORMAL LOW (ref 6.5–8.1)

## 2018-02-11 LAB — GLUCOSE, CAPILLARY
GLUCOSE-CAPILLARY: 103 mg/dL — AB (ref 65–99)
Glucose-Capillary: 100 mg/dL — ABNORMAL HIGH (ref 65–99)
Glucose-Capillary: 104 mg/dL — ABNORMAL HIGH (ref 65–99)
Glucose-Capillary: 118 mg/dL — ABNORMAL HIGH (ref 65–99)

## 2018-02-11 LAB — PHENYTOIN LEVEL, TOTAL: Phenytoin Lvl: 5.5 ug/mL — ABNORMAL LOW (ref 10.0–20.0)

## 2018-02-11 LAB — CBC
HCT: 29.9 % — ABNORMAL LOW (ref 39.0–52.0)
Hemoglobin: 8.9 g/dL — ABNORMAL LOW (ref 13.0–17.0)
MCH: 24.7 pg — AB (ref 26.0–34.0)
MCHC: 29.8 g/dL — ABNORMAL LOW (ref 30.0–36.0)
MCV: 83.1 fL (ref 78.0–100.0)
PLATELETS: 507 10*3/uL — AB (ref 150–400)
RBC: 3.6 MIL/uL — ABNORMAL LOW (ref 4.22–5.81)
RDW: 16.9 % — AB (ref 11.5–15.5)
WBC: 8.1 10*3/uL (ref 4.0–10.5)

## 2018-02-11 MED ORDER — INSULIN ASPART 100 UNIT/ML ~~LOC~~ SOLN: 0.0000 [IU] | SUBCUTANEOUS | 11 refills | Status: DC

## 2018-02-11 MED ORDER — IPRATROPIUM-ALBUTEROL 0.5-2.5 (3) MG/3ML IN SOLN: 3.0000 mL | Freq: Four times a day (QID) | RESPIRATORY_TRACT | Status: DC | PRN

## 2018-02-11 MED ORDER — IPRATROPIUM-ALBUTEROL 0.5-2.5 (3) MG/3ML IN SOLN: 3.0000 mL | Freq: Four times a day (QID) | RESPIRATORY_TRACT | Status: DC

## 2018-02-11 MED ORDER — METOPROLOL TARTRATE 25 MG/10 ML ORAL SUSPENSION: 25.0000 mg | Freq: Two times a day (BID) | ORAL | Status: DC

## 2018-02-11 MED ORDER — SODIUM CHLORIDE 0.9 % IV SOLN: 150.0000 mg | Freq: Three times a day (TID) | INTRAVENOUS | Status: DC

## 2018-02-11 MED ORDER — IOPAMIDOL (ISOVUE-300) INJECTION 61%
INTRAVENOUS | Status: AC
  Filled 2018-02-11: qty 50

## 2018-02-11 MED ORDER — FAMOTIDINE 40 MG/5ML PO SUSR: 20.0000 mg | Freq: Two times a day (BID) | ORAL | 0 refills | Status: DC

## 2018-02-11 MED ORDER — LEVETIRACETAM IN NACL 1500 MG/100ML IV SOLN: 1500.0000 mg | Freq: Two times a day (BID) | INTRAVENOUS | Status: DC

## 2018-02-11 MED ORDER — SODIUM CHLORIDE 0.9 % IV SOLN
150.0000 mg | Freq: Three times a day (TID) | INTRAVENOUS | Status: DC
  Administered 2018-02-11: 150 mg via INTRAVENOUS
  Filled 2018-02-11 (×3): qty 3

## 2018-02-11 MED ORDER — SODIUM CHLORIDE 0.9% FLUSH: 10.0000 mL | INTRAVENOUS | Status: DC | PRN

## 2018-02-11 MED ORDER — SIMETHICONE 40 MG/0.6ML PO SUSP: 80.0000 mg | Freq: Four times a day (QID) | ORAL | 0 refills | Status: DC

## 2018-02-11 MED ORDER — ADULT MULTIVITAMIN LIQUID CH: 15.0000 mL | Freq: Every day | ORAL | Status: DC

## 2018-02-11 MED ORDER — FENTANYL CITRATE (PF) 100 MCG/2ML IJ SOLN: 50.0000 ug | INTRAMUSCULAR | 0 refills | Status: DC | PRN

## 2018-02-11 MED ORDER — POLYETHYLENE GLYCOL 3350 17 G PO PACK: 17.0000 g | PACK | Freq: Every day | ORAL | 0 refills | Status: DC

## 2018-02-11 MED ORDER — SODIUM CHLORIDE 0.9% FLUSH: 10.0000 mL | Freq: Two times a day (BID) | INTRAVENOUS | Status: DC

## 2018-02-11 MED ORDER — TOPIRAMATE 200 MG PO TABS: 200.0000 mg | ORAL_TABLET | Freq: Two times a day (BID) | ORAL | Status: DC

## 2018-02-11 MED ORDER — IRBESARTAN 300 MG PO TABS: 300.0000 mg | ORAL_TABLET | Freq: Every day | ORAL | Status: DC

## 2018-02-11 MED ORDER — ALPRAZOLAM 0.25 MG PO TABS: 0.2500 mg | ORAL_TABLET | Freq: Three times a day (TID) | ORAL | 0 refills | Status: DC | PRN

## 2018-02-11 MED ORDER — SODIUM CHLORIDE 0.9 % IV SOLN: 1.0000 g | INTRAVENOUS | Status: DC

## 2018-02-11 MED ORDER — SODIUM CHLORIDE 0.9 % IV SOLN
200.0000 mg | Freq: Two times a day (BID) | INTRAVENOUS | Status: DC
  Administered 2018-02-11: 200 mg via INTRAVENOUS
  Filled 2018-02-11 (×2): qty 20

## 2018-02-11 MED ORDER — ASPIRIN 325 MG PO TABS: 325.0000 mg | ORAL_TABLET | Freq: Every day | ORAL | Status: DC

## 2018-02-11 MED ORDER — ACETAMINOPHEN 160 MG/5ML PO SOLN: 650.0000 mg | Freq: Four times a day (QID) | ORAL | 0 refills | Status: DC | PRN

## 2018-02-11 MED ORDER — CHLORHEXIDINE GLUCONATE CLOTH 2 % EX PADS: 6.0000 | MEDICATED_PAD | Freq: Every day | CUTANEOUS | Status: DC

## 2018-02-11 MED ORDER — SODIUM CHLORIDE 0.9 % IV SOLN: 200.0000 mg | Freq: Two times a day (BID) | INTRAVENOUS | Status: DC

## 2018-02-11 MED ORDER — CHLORHEXIDINE GLUCONATE 0.12% ORAL RINSE (MEDLINE KIT): 15.0000 mL | Freq: Two times a day (BID) | OROMUCOSAL | 0 refills | Status: DC

## 2018-02-11 MED ORDER — LEVETIRACETAM IN NACL 1500 MG/100ML IV SOLN
1500.0000 mg | Freq: Two times a day (BID) | INTRAVENOUS | Status: DC
  Administered 2018-02-11: 1500 mg via INTRAVENOUS
  Filled 2018-02-11 (×2): qty 100

## 2018-02-11 MED ORDER — DOCUSATE SODIUM 50 MG/5ML PO LIQD: 100.0000 mg | Freq: Two times a day (BID) | ORAL | 0 refills | Status: DC

## 2018-02-11 MED ORDER — HYDRALAZINE HCL 20 MG/ML IJ SOLN: 5.0000 mg | Freq: Four times a day (QID) | INTRAMUSCULAR | Status: DC | PRN

## 2018-02-11 MED ORDER — BISACODYL 10 MG RE SUPP: 10.0000 mg | Freq: Every day | RECTAL | 0 refills | Status: DC | PRN

## 2018-02-11 MED ORDER — HEPARIN SODIUM (PORCINE) 5000 UNIT/ML IJ SOLN: 5000.0000 [IU] | Freq: Three times a day (TID) | INTRAMUSCULAR | Status: DC

## 2018-02-11 NOTE — Telephone Encounter (Signed)
Pt still admitted as of 02/11/18.

## 2018-02-11 NOTE — Progress Notes (Signed)
PULMONARY / CRITICAL CARE MEDICINE   Name: Danny Krause MRN: [id number] DOB: 1941/02/11    ADMISSION DATE:  01/24/2018 CONSULTATION DATE:  01/24/2018  REFERRING MD:  Maudie Mercury  CHIEF COMPLAINT:  Seizures  BRIEF SUMMARY:   77 y/o M who presented 5/23 after being found unresponsive at home with concern for seizure like activity.  He was intubated in the ER for hypercapnic respiratory failure.  He has a history of CVA with encephalomalacia and seizures (first dx 12/2017), on Keppra with questionable compliance.  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   On the floor 5/27, he developed new left-sided weakness and a CT angiogram was performed which showed no evidence of large vessel occlusion.  Neurology consulted.  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  In light of his extremely poor baseline respiratory function and prior CO2 retention during this admission only 1 mg of Ativan was administered on the floor after which there was still seizure activity on the EEG but the patient was profoundly somnolent.  It was elected elected to transfer him to ICU for intubation to allow deep sedation to burst suppression.  He required as much as 80 mcg of propofol,  And 20 mg/hr Versed for sedation to burst supression and neo-synephrine for BP support.    SUBJECTIVE:  On trach collar stayed off the vent last night has a lot of secretions. Scheduled for PEG today   VITAL SIGNS: BP (!) 154/84   Pulse 99   Temp 98.3 F (36.8 C) (Axillary)   Resp (!) 27   Ht 5\' 5"  (1.651 m)   Wt 67 kg (147 lb 11.3 oz)   SpO2 97%   BMI 24.58 kg/m   HEMODYNAMICS:    VENTILATOR SETTINGS: FiO2 (%):  [28 %-35 %] 28 %  INTAKE / OUTPUT: I/O last 3 completed shifts: In: 2130 [I.V.:10; NG/GT:1920; IV Piggyback:200] Out: 8546 [Urine:3760; Stool:300]  PHYSICAL EXAMINATION: General: Male currently on trach collar HEENT: Tracheostomy in place, NG  feeding tube in  place Neuro: Opens eyes but does not follow commands CV: Sounds regular currently sinus rhythm PULM: Decreased breath sounds in the bases, coarse rhonchi bilaterally EV:OJJK, non-tender, bsx4 active  Extremities: warm/dry, 2+ edema, venous stasis lower extremities noted Skin: no rashes or lesions    LABS:  BMET Recent Labs  Lab 02/08/18 0550 02/09/18 0757 02/10/18 0831  NA 137 137 139  K 3.3* 4.8 3.5  CL 111 108 109  CO2 21* 18* 24  BUN 20 19 16   CREATININE 0.76 0.85 0.78  GLUCOSE 116* 101* 120*    Electrolytes Recent Labs  Lab 02/07/18 0351 02/08/18 0550 02/09/18 0757 02/10/18 0831  CALCIUM 8.3* 7.6* 8.4* 8.3*  MG 1.9 1.7 2.1  --   PHOS 3.5 3.0 3.3  --     CBC Recent Labs  Lab 02/07/18 0351 02/08/18 0550 02/10/18 0831  WBC 10.6* 15.4* 11.6*  HGB 9.1* 8.4* 9.2*  HCT 30.3* 27.8* 31.4*  PLT 457* 448* 514*   Coag's Recent Labs  Lab 02/06/18 0322  INR 1.05    Sepsis Markers No results for input(s): LATICACIDVEN, PROCALCITON, O2SATVEN in the last 168 hours. ABG Recent Labs  Lab 02/06/18 0408  PHART 7.381  PCO2ART 36.7  PO2ART 116*   Liver Enzymes Recent Labs  Lab 02/10/18 0831  AST 30  ALT 34  ALKPHOS 97  BILITOT 0.5  ALBUMIN 2.3*    Cardiac Enzymes No results for input(s):  TROPONINI, PROBNP in the last 168 hours.  Glucose Recent Labs  Lab 02/10/18 1241 02/10/18 1601 02/10/18 1938 02/10/18 2321 02/11/18 0319 02/11/18 0806  GLUCAP 119* 113* 119* 123* 100* 103*   Imaging No results found. STUDIES:  EEG showed:  1. Moderate diffuse background slowing 2. Lateralized periodic discharges (LPDs) over the right frontotemporal region - consistent with possible epileptogenic focus. 3. Brief 5-second period of rhythmic theta activity over the right temporal region without evolution CTA Head / Neck 5/27 >> no intracranial or extracranial flow reducing stenosis or emergent large vessel occlusion  CT Head Perfusion 5/27 >> relative  increased cerebral blood flow & cerebral blood volume in the right hemisphere as compared to the left, correlates with suspected status / epileptiform activity EEG 5/29 >> abnormal continuous EEG due to burst suppression with occasional right central LPD's. No ictal evolution.  Pattern consistent with a diffuse cerebral disturbance and a superimposed focal epileptogenic disturbance on the right.  LPD's could be representative of an injury pattern given their persistence despite AED treatment.   CULTURES: 02/07/2018 blood cultures x2>> 02/07/2018 sputum culture>> 02/07/2018 urine culture>>  ANTIBIOTICS: Zosyn 5/27 >> 02/01/2018 6/6 vanc> 6/6 zoysn>>  SIGNIFICANT EVENTS: 5/23  Admit, intubated with concern for seizure 5/24  Extubated  5/24  Neuro rec's continuing dual anticonvulsant therapy until non-compliance can be confirmed. 5/27  On floor, had left sided weakness felt to be Todd seizure, reintubated 5/30  Ongoing concern for seizure, remains suppressed  02/01/2018 heavy sedation for burst supression discontinued 6/4 not sedated, opens eyes, not following commands 02/06/2018 tracheostomy placed  LINES/TUBES: L [place] TLC 5/27 >> ETT 5/27 >> 1 02/06/2018 Tracheostomy placed on 02/06/2018>>  DISCUSSION: 77 year old with a distant history of a right frontal CVA with encephalomalacia on his CT scan who was treated for status epilepticus with sedation to burst suppression.  He also has a history of O2 dependent chronic lung disease.  He has not yet emerged from deep sedation, and continues to require mechanical ventilation.  LTAC placement is being pursued.  ASSESSMENT / PLAN:  NEUROLOGIC A:   Seizure Disorder Hx CVA P:   Neuro is following Awake alert  Medications per neurology    PULMONARY A: O2 Dependent COPD  Rule Out Aspiration  Respiratory Acidosis - corrected  VDRF P: On trach collar stayed off the vent last night LTAC today after PEG   CARDIOVASCULAR A:  Hypertension  Hx  AVR, CAD s/p CABG, multiple stents  Hypotension secondary to sedation P:  Continue current treatment  RENAL Recent Labs  Lab 02/08/18 0550 02/09/18 0757 02/10/18 0831  K 3.3* 4.8 3.5    A:   Hypokalemia  P:   Daily chemistries Replete electrolytes as needed  GASTROINTESTINAL A:   Moderate Nutritional Risk  P:   Currently n.p.o. Scheduled for PEG today   HEMATOLOGIC  A:   Anemia - without bleeding, suspect of chronic disease P:  DVT prophylaxis Transfuse per protocol  INFECTIOUS A:   Spiked fever 02/07/2018 P: 02/07/2018 started on vancomycin and Zosyn Panculture on 02/07/2018  ENDOCRINE CBG (last 3)  Recent Labs    02/10/18 2321 02/11/18 0319 02/11/18 0806  GLUCAP 123* 100* 103*    No active issue  A:   Well-controlled sliding scale insulin   Silver Huguenin  M.D. [place] [facility].  pager: [phone].

## 2018-02-11 NOTE — Progress Notes (Signed)
Modified Barium Swallow Progress Note  Patient Details  Name: Danny Krause MRN: [id number] Date of Birth: Dec 11, 1940  Today's Date: 02/11/2018  Modified Barium Swallow completed.  Full report located under Chart Review in the Imaging Section.  Brief recommendations include the following:  Clinical Impression  Pt presents with a mild oropharyngeal dysphagia - PMV was in place for duration of study, and VS were stable.  He demonstrated lingual pumping, prolonged oral preparation of pureed solids.  Pharyngeal swallow triggered at the level of the pyriforms for thin liquids, valleculae for nectars.  There was consistent and reliable airway protection with nectar thick liquids with no penetration nor aspiration.  Thin liquids were observed to penetrate the larynx after the swallow.  There was adequate pharyngeal contraction with no residue post-swallow.  For now, recommend initiating a more conservative diet of dysphagia 1 with nectar thick liquds; give meds crushed in puree.  Pt must use PMV during all PO intake.  D/W RN, daughter.  Pt for likely D/C this afternoon to LTAC.    Swallow Evaluation Recommendations       SLP Diet Recommendations: Nectar thick liquid;Dysphagia 1 (Puree) solids   Liquid Administration via: Straw   Medication Administration: Crushed with puree   Supervision: Staff to assist with self feeding   Compensations: Slow rate;Small sips/bites;Minimize environmental distractions   Postural Changes: Remain semi-upright after after feeds/meals (Comment)       Other Recommendations: Place PMSV during PO intake    Juan Quam Laurice 02/11/2018,3:25 PM

## 2018-02-11 NOTE — Progress Notes (Signed)
Princella Pellegrini discharged to [facility] per MD order.  Report called to receiving Kathlee Nations, RN at [phone].   Allergies as of 02/11/2018   No Known Allergies     Medication List    STOP taking these medications   acetaminophen 650 MG CR tablet Commonly known as:  TYLENOL Replaced by:  acetaminophen 160 MG/5ML solution   albuterol 108 (90 Base) MCG/ACT inhaler Commonly known as:  PROAIR HFA   budesonide-formoterol 160-4.5 MCG/ACT inhaler Commonly known as:  SYMBICORT   levETIRAcetam 500 MG tablet Commonly known as:  KEPPRA   metoprolol tartrate 25 MG tablet Commonly known as:  LOPRESSOR Replaced by:  metoprolol tartrate 25 mg/10 mL Susp   olmesartan 40 MG tablet Commonly known as:  BENICAR Replaced by:  irbesartan 300 MG tablet     TAKE these medications   acetaminophen 160 MG/5ML solution Commonly known as:  TYLENOL Place 20.3 mLs (650 mg total) into feeding tube every 6 (six) hours as needed for fever (Fever >101.0). Replaces:  acetaminophen 650 MG CR tablet   ALPRAZolam 0.25 MG tablet Commonly known as:  XANAX Place 1-2 tablets (0.25-0.5 mg total) into feeding tube 3 (three) times daily as needed for anxiety.   aspirin 325 MG tablet Place 1 tablet (325 mg total) into feeding tube daily. Start taking on:  02/12/2018   bisacodyl 10 MG suppository Commonly known as:  DULCOLAX Place 1 suppository (10 mg total) rectally daily as needed for moderate constipation.   cefTRIAXone 1 g in sodium chloride 0.9 % 100 mL Inject 1 g into the vein daily. Start taking on:  02/12/2018   chlorhexidine gluconate (MEDLINE KIT) 0.12 % solution Commonly known as:  PERIDEX 15 mLs by Mouth Rinse route 2 (two) times daily.   Chlorhexidine Gluconate Cloth 2 % Pads Apply 6 each topically daily. Start taking on:  02/12/2018   docusate 50 MG/5ML liquid Commonly known as:  COLACE Place 10 mLs (100 mg total) into feeding tube 2 (two) times daily.   famotidine 40 MG/5ML  suspension Commonly known as:  PEPCID Place 2.5 mLs (20 mg total) into feeding tube 2 (two) times daily.   fentaNYL 100 MCG/2ML injection Commonly known as:  SUBLIMAZE Inject 1 mL (50 mcg total) into the vein every 2 (two) hours as needed for moderate pain (RASS goal 0).   heparin 5000 UNIT/ML injection Inject 1 mL (5,000 Units total) into the skin every 8 (eight) hours.   hydrALAZINE 20 MG/ML injection Commonly known as:  APRESOLINE Inject 0.25 mLs (5 mg total) into the vein every 6 (six) hours as needed (For SBP > 160).   insulin aspart 100 UNIT/ML injection Commonly known as:  novoLOG Inject 0-9 Units into the skin every 4 (four) hours.   ipratropium-albuterol 0.5-2.5 (3) MG/3ML Soln Commonly known as:  DUONEB Take 3 mLs by nebulization every 6 (six) hours as needed.   ipratropium-albuterol 0.5-2.5 (3) MG/3ML Soln Commonly known as:  DUONEB Take 3 mLs by nebulization every 6 (six) hours.   irbesartan 300 MG tablet Commonly known as:  AVAPRO Place 1 tablet (300 mg total) into feeding tube daily. Start taking on:  02/12/2018 Replaces:  olmesartan 40 MG tablet   lacosamide 200 mg in sodium chloride 0.9 % 25 mL Inject 200 mg into the vein every 12 (twelve) hours.   levETIRacetam 1500 MG/100ML Soln Commonly known as:  KEPPRA Inject 100 mLs (1,500 mg total) into the vein every 12 (twelve) hours.   metoprolol tartrate 25  mg/10 mL Susp Commonly known as:  LOPRESSOR Place 10 mLs (25 mg total) into feeding tube 2 (two) times daily. Replaces:  metoprolol tartrate 25 MG tablet   multivitamin Liqd Place 15 mLs into feeding tube daily. Start taking on:  02/12/2018   phenytoin 150 mg in sodium chloride 0.9 % 100 mL Inject 150 mg into the vein 3 (three) times daily.   polyethylene glycol packet Commonly known as:  MIRALAX / GLYCOLAX Place 17 g into feeding tube daily. Start taking on:  02/12/2018   simethicone 40 MG/0.6ML drops Commonly known as:  MYLICON Place 1.2 mLs (80  mg total) into feeding tube 4 (four) times daily.   sodium chloride flush 0.9 % Soln Commonly known as:  NS 10-40 mLs by Intracatheter route every 12 (twelve) hours.   sodium chloride flush 0.9 % Soln Commonly known as:  NS 10-40 mLs by Intracatheter route as needed (flush).   topiramate 200 MG tablet Commonly known as:  TOPAMAX Place 1 tablet (200 mg total) into feeding tube 2 (two) times daily.       Patients skin is clean, dry and intact, no evidence of skin break down. IV catheter remains intact and will remain in place for discharge to [facility]. Site without signs and symptoms of complications. Dressing is clean, dry, and intact at this time.Foley catheter also left in place per Dr Thereasa Solo post discharge to [facility].   Patient transported on a hospital bed by myself and other RN,  no distress noted upon discharge.  Darnelle Maffucci 02/11/2018 5:37 PM

## 2018-02-11 NOTE — Discharge Summary (Signed)
DISCHARGE SUMMARY  ARTHURO Krause  MR#: [id number]  DOB:1941/08/25  Date of Admission: 01/24/2018 Date of Discharge: 02/11/2018  Attending Physician:Caili Escalera Hennie Duos, MD  Patient's PCP:Kim, Jeneen Rinks, MD  Consults: PCCM Neurology   Disposition: D/C to [facility]   Follow-up Appts: To be determined at such time pt is able to be D/C from [facility]  Tests Needing Follow-up: -cont weaning of vent w/ ultimate goal for trach decannulation  -monitor oral intake and remove Cortrak if intake sufficient, or resume temporary tube feeds if not   Diet: Dysphagia 1 w/ Nectar Thick Liquids  Discharge Diagnoses: Refractory status epilepticus  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 He was intubated in the ER for hypercapnic respiratory failure.    While in the hospital on 5/27 he developed new left-sided weakness but a CT angio showed no evidence of large vessel occlusion. EEG revealed subclinical status. He was transferred back to the ICU for intubation to allow burst suppression. He required as much as 80 mcg of propofol and 20 mg/hr Versed for burst supression, and neo-synephrine for BP support.   Hospital Course: 5/23 Admit, intubated with concern for seizure 5/24 Extubated  5/27 On floor developed acute left sided weakness > reintubated 5/30 Ongoing concern for seizure, remains suppressed  5/31  heavy sedation for burst supression discontinued 6/4 not sedated, opens eyes, not following commands 6/5 tracheostomy placed 6/8 TRH assumed care  6/10 cleared for D1 diet w/ nectar  liquids therefore PEG canceled - transfer to LTACH  Refractory status epilepticus  Presumed due to previous R frontal stroke/large R MCA stroke - cont Keppra 1.5 g twice daily, Dilantin 150 mg 3 times daily, Vimpat 150 mg twice daily, topiramate 200 mg twice daily as per Neurology recs   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  S/p trach - on TC trials per PCCM - for LTACH placement for long term weaning trials   Encephalopathy Currently felt to be at his new baseline mental status - attempt to manage anxiety w/o causing excessive somnolence   O2 Dependent COPD  Compensated at time of d/c   Hypertension  BP increased today in setting of increased anxiety - meds adjusted - follow   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  Moraxella tracheobronchitis  Vancomycin and Zosyn started 6/6 - narrowed to Rocephin alone prior to d/c - will require frequent suctioning   Nutrition - Dysphagia  Did well on SLP eval 6/10 and now cleared for modified diet - Cortrak to be left in place for now, but tube feeding to be stopped - Cortrak can be removed as soon as intake proven to be sufficient   Allergies as of 02/11/2018   No Known Allergies     Medication List    STOP taking these medications   acetaminophen 650 MG CR tablet Commonly known as:  TYLENOL Replaced by:  acetaminophen 160 MG/5ML solution   albuterol 108 (90 Base) MCG/ACT inhaler Commonly known as:  PROAIR HFA   budesonide-formoterol 160-4.5 MCG/ACT inhaler Commonly known as:  SYMBICORT   levETIRAcetam 500 MG tablet Commonly known as:  KEPPRA   metoprolol tartrate 25 MG tablet Commonly known as:  LOPRESSOR Replaced by:  metoprolol tartrate 25 mg/10 mL Susp   olmesartan 40 MG tablet Commonly known as:  BENICAR Replaced by:  irbesartan 300 MG tablet     TAKE these medications   acetaminophen 160 MG/5ML solution Commonly known as:  TYLENOL Place 20.3 mLs (650 mg total) into feeding tube every 6 (six)  hours as needed for fever (Fever >101.0). Replaces:  acetaminophen 650 MG CR tablet   ALPRAZolam 0.25 MG tablet Commonly known as:  XANAX Place 1-2 tablets (0.25-0.5 mg total) into feeding tube 3 (three) times daily as needed for anxiety.   aspirin 325 MG tablet Place 1 tablet (325 mg total) into feeding tube daily. Start taking on:  02/12/2018   bisacodyl 10 MG suppository Commonly known as:  DULCOLAX Place 1 suppository (10 mg total) rectally daily as needed for moderate constipation.   cefTRIAXone 1 g in sodium chloride 0.9 % 100 mL Inject 1 g into the vein daily. Start taking on:  02/12/2018   chlorhexidine gluconate (MEDLINE KIT) 0.12 % solution Commonly known as:  PERIDEX 15 mLs by Mouth Rinse route 2 (two) times daily.   Chlorhexidine Gluconate Cloth 2 % Pads Apply 6 each topically daily. Start taking on:  02/12/2018   docusate 50 MG/5ML liquid Commonly known as:  COLACE Place 10 mLs (100 mg total) into feeding tube 2 (two) times daily.   famotidine 40 MG/5ML suspension Commonly known as:  PEPCID Place 2.5 mLs (20 mg total) into feeding tube 2 (two) times daily.   fentaNYL 100 MCG/2ML injection Commonly known as:  SUBLIMAZE Inject 1 mL (50 mcg total) into the vein every 2 (two) hours as needed for moderate pain (RASS goal 0).   heparin 5000 UNIT/ML injection Inject 1 mL (5,000 Units total) into the skin every 8 (eight) hours.   hydrALAZINE 20 MG/ML injection Commonly known as:  APRESOLINE Inject 0.25 mLs (5 mg total) into the vein every 6 (six) hours as needed (For SBP > 160).   insulin aspart 100 UNIT/ML injection Commonly known as:  novoLOG Inject 0-9 Units into the skin every 4 (four) hours.   ipratropium-albuterol 0.5-2.5 (3) MG/3ML Soln Commonly known as:  DUONEB Take 3 mLs by nebulization every 6 (six) hours as needed.   ipratropium-albuterol 0.5-2.5 (3) MG/3ML Soln Commonly known as:  DUONEB Take 3 mLs by nebulization every 6 (six) hours.     irbesartan 300 MG tablet Commonly known as:  AVAPRO Place 1 tablet (300 mg total) into feeding tube daily. Start taking on:  02/12/2018 Replaces:  olmesartan 40 MG tablet   lacosamide 200 mg in sodium chloride 0.9 % 25 mL Inject 200 mg into the vein every 12 (twelve) hours.   levETIRacetam 1500 MG/100ML Soln Commonly known as:  KEPPRA Inject 100 mLs (1,500 mg total) into the vein every 12 (twelve) hours.   metoprolol tartrate 25 mg/10 mL Susp Commonly known as:  LOPRESSOR Place 10 mLs (25 mg total) into feeding tube 2 (two) times daily. Replaces:  metoprolol tartrate 25 MG tablet   multivitamin Liqd Place 15 mLs into feeding tube daily. Start taking on:  02/12/2018   phenytoin 150 mg in sodium chloride 0.9 % 100 mL Inject 150 mg into the vein 3 (three) times daily.   polyethylene glycol packet Commonly known as:  MIRALAX / GLYCOLAX Place 17 g into feeding tube daily. Start taking on:  02/12/2018   simethicone 40 MG/0.6ML drops  Commonly known as:  MYLICON Place 1.2 mLs (80 mg total) into feeding tube 4 (four) times daily.   sodium chloride flush 0.9 % Soln Commonly known as:  NS 10-40 mLs by Intracatheter route every 12 (twelve) hours.   sodium chloride flush 0.9 % Soln Commonly known as:  NS 10-40 mLs by Intracatheter route as needed (flush).   topiramate 200 MG tablet Commonly known as:  TOPAMAX Place 1 tablet (200 mg total) into feeding tube 2 (two) times daily.       Day of Discharge BP (!) 155/82   Pulse 98   Temp 98.7 F (37.1 C) (Axillary)   Resp (!) 32   Ht _0  (1.651 m)   Wt 67 kg (147 lb 11.3 oz)   SpO2 98%   BMI 24.58 kg/m   Physical Exam: General: No acute respiratory distress - anxious - coughing trach secretions  Lungs: Clear to auscultation bilaterally without wheezes or crackles - course upper airway sounds th/o  Cardiovascular: Regular rate and rhythm - no M or rub  Abdomen: NT/ND, soft, bs+, no mass  Extremities: No significant edema  bilateral lower extremities  Basic Metabolic Panel: Recent Labs  Lab 02/06/18 0322 02/07/18 0351 02/08/18 0550 02/09/18 0757 02/10/18 0831  NA 140 139 137 137 139  K 3.9 3.4* 3.3* 4.8 3.5  CL 115* 109 111 108 109  CO2 22 22 21* 18* 24  GLUCOSE 109* 128* 116* 101* 120*  BUN _1 CREATININE 0.72 0.75 0.76 0.85 0.78  CALCIUM 8.2* 8.3* 7.6* 8.4* 8.3*  MG 2.0 1.9 1.7 2.1  --   PHOS 2.9 3.5 3.0 3.3  --     Liver Function Tests: Recent Labs  Lab 02/10/18 0831  AST 30  ALT 34  ALKPHOS 97  BILITOT 0.5  PROT 5.9*  ALBUMIN 2.3*    Coags: Recent Labs  Lab 02/06/18 0322  INR 1.05    CBC: Recent Labs  Lab 02/05/18 0500 02/06/18 0322 02/07/18 0351 02/08/18 0550 02/10/18 0831  WBC 9.7 9.6 10.6* 15.4* 11.6*  NEUTROABS  --   --   --  12.7*  --   HGB 9.2* 9.0* 9.1* 8.4* 9.2*  HCT 31.0* 30.4* 30.3* 27.8* 31.4*  MCV 82.9 83.3 83.2 83.2 84.6  PLT 419* 444* 457* 448* 514*    CBG: Recent Labs  Lab 02/10/18 2321 02/11/18 0319 02/11/18 0806 02/11/18 1139 02/11/18 1517  GLUCAP 123* 100* 103* 104* 118*    Recent Results (from the past 240 hour(s))  Culture, blood (Routine X 2) w Reflex to ID Panel     Status: None (Preliminary result)   Collection Time: 02/07/18 11:04 AM  Result Value Ref Range Status   Specimen Description BLOOD RIGHT HAND  Final   Special Requests   Final    BOTTLES DRAWN AEROBIC AND ANAEROBIC Blood Culture adequate volume   Culture   Final    NO GROWTH 4 DAYS Performed at [facility], [address] [place], [place] [address]    Report Status PENDING  Incomplete  Culture, blood (Routine X 2) w Reflex to ID Panel     Status: None (Preliminary result)   Collection Time: 02/07/18 11:29 AM  Result Value Ref Range Status   Specimen Description BLOOD RIGHT HAND  Final   Special Requests   Final    BOTTLES DRAWN AEROBIC AND ANAEROBIC Blood Culture adequate volume   Culture   Final    NO GROWTH  4 DAYS Performed at [facility], [place] [address] [place], [place] [address]    Report Status PENDING  Incomplete  Culture, respiratory (NON-Expectorated)     Status: None   Collection Time: 02/07/18 12:05 PM  Result Value Ref Range Status   Specimen Description TRACHEAL ASPIRATE  Final   Special Requests Normal  Final   Gram Stain   Final    RARE WBC PRESENT, PREDOMINANTLY PMN RARE GRAM POSITIVE COCCI    Culture   Final    FEW MORAXELLA CATARRHALIS(BRANHAMELLA) BETA LACTAMASE POSITIVE Performed at [facility], [place] [address], [place], [place] [address]    Report Status 02/08/2018 FINAL  Final     Time spent in discharge (includes decision making & examination of pt): 35 minutes  02/11/2018, 5:18 PM   Cherene Altes, MD [facility] Office  [phone] Pager [phone]  On-Call/Text Page:      [email or web address]      password [facility]

## 2018-02-11 NOTE — Evaluation (Signed)
[facility]y-[facility] Speaking Valve - Evaluation Patient Details  Name: Danny Krause MRN: [id number] Date of Birth: 06-21-1941  Today's Date: 02/11/2018 Time: 1130-1150 SLP Time Calculation (min) (ACUTE ONLY): 20 min  Past Medical History:  Past Medical History:  Diagnosis Date  . Anginal pain ([place])    with exertion  . Coronary artery disease   . Critical aortic valve stenosis   . Dyspnea    with exertion  . Murmur   . Seizure ([place])   . Syncope    Past Surgical History:  Past Surgical History:  Procedure Laterality Date  . AORTIC VALVE REPLACEMENT N/A 07/04/2017   Procedure: AORTIC VALVE REPLACEMENT (AVR);  Surgeon: Grace Isaac, MD;  Location: [facility];  Service: Open Heart Surgery;  Laterality: N/A;  . CORONARY ARTERY BYPASS GRAFT N/A 07/04/2017   Procedure: CORONARY ARTERY BYPASS GRAFTING (CABG) x three, using left internal mammary artery and right leg greater saphenous vein harvested endoscopically;  Surgeon: Grace Isaac, MD;  Location: [facility];  Service: Open Heart Surgery;  Laterality: N/A;  . NO PAST SURGERIES    . RIGHT/LEFT HEART CATH AND CORONARY ANGIOGRAPHY N/A 06/25/2017   Procedure: RIGHT/LEFT HEART CATH AND CORONARY ANGIOGRAPHY;  Surgeon: Troy Sine, MD;  Location: [place] [facility];  Service: Cardiovascular;  Laterality: N/A;  . TEE WITHOUT CARDIOVERSION N/A 07/04/2017   Procedure: TRANSESOPHAGEAL ECHOCARDIOGRAM (TEE);  Surgeon: Grace Isaac, MD;  Location: [facility];  Service: Open Heart Surgery;  Laterality: N/A;  . THORACIC AORTOGRAM  06/25/2017   Procedure: THORACIC AORTOGRAM;  Surgeon: Troy Sine, MD;  Location: [facility];  Service: Cardiovascular;;  aortic root    HPI:  77 y/o M with a history of COPD on O2 QHS and CVA with encephalomalacia and seizures (first dx 12/2017) on Keppra with questionable compliance who presented 5/23 after being found unresponsive at home with concern for seizure activity. He was intubated in the ER for  hypercapnic respiratory failure.  While in the hospital on 5/27 he developed new left-sided weakness but a CT angio showed no evidence of large vessel occlusion. EEG revealed subclinical status. He was transferred back to the ICU for intubation to allow burst suppression. He required as much as 80 mcg of propofol and 20 mg/hr Versed for burst supression, and neo-synephrine for BP support.  ETT 5/23-24; reintubated 5/27; trach 6/5.    Assessment / Plan / Recommendation Clinical Impression  Pt presents with excellent toleration of PMV during initial assessment.  Cuff deflated upon arrival; RT had just suctioned.  Pt has been on ATC since last night.  VS HR 104, RR33, SP02 95 at baseline and throughout PMV trial. Pt required initial mod cues to exhale orally, sustain "ah" and count from 1-10 - after practice he began to achieve improved phonation and respiratory support for speech. By end of session he was communicating naturally with his daughter, achieving adequate-quality phonation (hoarse, but good volume), and maintaining stable VS.  Pt's daughter has a son who has a chronic trach and uses a PMV; she is able to assist pt with placement/removal of valve and understands precautions for use.  Recommend pt begin to use PMV with full supervision of staff or his daughter; remove valve upon leaving pt alone.  He is ready for a swallow evaluation - is scheduled for 1:30 this pm.  Pt/dtr/RN agree with plan.  SLP Visit Diagnosis: Aphonia (R49.1)    SLP Assessment  Patient needs continued [facility]  in next venue of care.  Has orders for D/C to [facility] today.    Follow Up Recommendations       Frequency and Duration         PMSV Trial PMSV was placed for: intervals during 15 minute trial Able to redirect subglottic air through upper airway: Yes Able to Attain Phonation: Yes Voice Quality: Hoarse Able to Expectorate Secretions: No attempts Level of Secretion Expectoration  with PMSV: Tracheal Breath Support for Phonation: Mildly decreased Intelligibility: Intelligible Respirations During Trial: (!) 33 SpO2 During Trial: 95 % Pulse During Trial: 104 Behavior: Alert   Tracheostomy Tube  Additional Tracheostomy Tube Assessment Fenestrated: No Level of Secretion Expectoration: Tracheal    Vent Dependency  Vent Dependent: (tolerated trach collar all night and this am) FiO2 (%): 28 %    Cuff Deflation Trial  GO Tolerated Cuff Deflation: Yes Length of Time for Cuff Deflation Trial: deflated upon arrival per RT Behavior: Alert;Confused        Juan Quam Laurice 02/11/2018, 3:12 PM

## 2018-02-12 ENCOUNTER — Other Ambulatory Visit (HOSPITAL_COMMUNITY): Payer: Medicare Other

## 2018-02-12 DIAGNOSIS — J40 Bronchitis, not specified as acute or chronic: Secondary | ICD-10-CM

## 2018-02-12 DIAGNOSIS — J9602 Acute respiratory failure with hypercapnia: Secondary | ICD-10-CM

## 2018-02-12 DIAGNOSIS — G9349 Other encephalopathy: Secondary | ICD-10-CM

## 2018-02-12 DIAGNOSIS — J449 Chronic obstructive pulmonary disease, unspecified: Secondary | ICD-10-CM

## 2018-02-12 DIAGNOSIS — J9601 Acute respiratory failure with hypoxia: Secondary | ICD-10-CM

## 2018-02-12 DIAGNOSIS — R569 Unspecified convulsions: Secondary | ICD-10-CM

## 2018-02-12 DIAGNOSIS — R911 Solitary pulmonary nodule: Secondary | ICD-10-CM

## 2018-02-12 LAB — CULTURE, BLOOD (ROUTINE X 2)
CULTURE: NO GROWTH
CULTURE: NO GROWTH
Special Requests: ADEQUATE
Special Requests: ADEQUATE

## 2018-02-12 LAB — BLOOD GAS, ARTERIAL
Acid-base deficit: 2.3 mmol/L — ABNORMAL HIGH (ref 0.0–2.0)
Bicarbonate: 22.9 mmol/L (ref 20.0–28.0)
FIO2: 28
O2 Saturation: 91.6 %
Patient temperature: 98.6
pCO2 arterial: 46.5 mmHg (ref 32.0–48.0)
pH, Arterial: 7.314 — ABNORMAL LOW (ref 7.350–7.450)
pO2, Arterial: 70.1 mmHg — ABNORMAL LOW (ref 83.0–108.0)

## 2018-02-12 LAB — BASIC METABOLIC PANEL
Anion gap: 11 (ref 5–15)
BUN: 9 mg/dL (ref 6–20)
CO2: 22 mmol/L (ref 22–32)
Calcium: 9.1 mg/dL (ref 8.9–10.3)
Chloride: 108 mmol/L (ref 101–111)
Creatinine, Ser: 0.77 mg/dL (ref 0.61–1.24)
GFR calc Af Amer: >60 mL/min (ref >60)
GFR calc non Af Amer: >60 mL/min (ref >60)
Glucose, Bld: 121 mg/dL — ABNORMAL HIGH (ref 65–99)
Potassium: 4.3 mmol/L (ref 3.5–5.1)
Sodium: 141 mmol/L (ref 135–145)

## 2018-02-12 NOTE — Consult Note (Signed)
[name]  PULMONARY SERVICE  Date of Service: 02/12/2018  PULMONARY CONSULT   Danny Krause  OAC:[id number]  DOB: 06-23-1941   DOA: 02/11/2018  Referring Physician: Merton Border, MD  HPI: Danny Krause is a 77 y.o. male seen for follow up of Acute on Chronic Respiratory Failure.  Apparently the patient has a known history of seizure disorder" having status epilepticus.  Patient was discovered to be in status epilepticus and for airway protection had to have intubation.  Patient subsequently had difficulty weaning off the ventilator and ultimately ended up with tracheostomy.  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  His hospital course was as follows.  Patient apparently developed left-sided weakness which was new and CT angiogram was done which did not show any significant occlusion.  An EEG was done which showed the subclinical status at that time.  The patient was started on propofol and also given Versed for suppression of the status epilepticus.  Patient was extubated and then transferred to the floor however had to be reintubated because of this new onset weakness.  He now presents to our facility for further management and weaning.  Currently has a tracheostomy in place.  Review of Systems:  ROS performed and is unremarkable other than noted above.  Past Medical History:  Diagnosis Date  . Anginal pain ([place])    with exertion  . Coronary artery disease   . Critical aortic valve stenosis   . Dyspnea    with exertion  . Murmur   . Seizure ([place])   . Syncope     Past Surgical History:  Procedure Laterality Date  . AORTIC VALVE REPLACEMENT N/A 07/04/2017   Procedure: AORTIC VALVE REPLACEMENT (AVR);  Surgeon: Grace Isaac, MD;  Location: [place];  Service: Open Heart Surgery;  Laterality: N/A;  . CORONARY ARTERY BYPASS GRAFT N/A 07/04/2017   Procedure: CORONARY ARTERY BYPASS  GRAFTING (CABG) x three, using left internal mammary artery and right leg greater saphenous vein harvested endoscopically;  Surgeon: Grace Isaac, MD;  Location: [place];  Service: Open Heart Surgery;  Laterality: N/A;  . NO PAST SURGERIES    . RIGHT/LEFT HEART CATH AND CORONARY ANGIOGRAPHY N/A 06/25/2017   Procedure: RIGHT/LEFT HEART CATH AND CORONARY ANGIOGRAPHY;  Surgeon: Troy Sine, MD;  Location: [place];  Service: Cardiovascular;  Laterality: N/A;  . TEE WITHOUT CARDIOVERSION N/A 07/04/2017   Procedure: TRANSESOPHAGEAL ECHOCARDIOGRAM (TEE);  Surgeon: Grace Isaac, MD;  Location: [facility];  Service: Open Heart Surgery;  Laterality: N/A;  . THORACIC AORTOGRAM  06/25/2017   Procedure: THORACIC AORTOGRAM;  Surgeon: Troy Sine, MD;  Location: [facility];  Service: Cardiovascular;;  aortic root     Social History:    reports that he quit smoking about 7 months ago. His smoking use included cigarettes. He has a 128.00 pack-year smoking history. He has never used smokeless tobacco. He reports that he does not drink alcohol or use drugs.  Family History: Non-Contributory to the present illness  No Known Allergies  Medications: Reviewed on Rounds  Physical Exam:  Vitals: Temperature 98.1 pulse 94 respiratory rate 26 blood pressure 117/59 saturations 95%  Ventilator Settings currently off of the ventilator on T collar with an FiO2 of 28%  . General: Comfortable at this time . Eyes: Grossly normal lids, irises & conjunctiva . ENT: grossly tongue is normal . Neck: no obvious  mass . Cardiovascular: S1-S2 normal no gallop or rub . Respiratory: Coarse breath sounds no rhonchi . Abdomen: Obese and soft . Skin: no rash seen on limited exam . Musculoskeletal: not rigid . Psychiatric:unable to assess . Neurologic: no seizure no involuntary movements         Labs on Admission:  Basic Metabolic Panel: Recent Labs  Lab 02/06/18 0322 02/07/18 0351  02/08/18 0550 02/09/18 0757 02/10/18 0831 02/11/18 2300  NA 140 139 137 137 139 142  K 3.9 3.4* 3.3* 4.8 3.5 3.4*  CL 115* 109 111 108 109 108  CO2 22 22 21* 18* 24 24  GLUCOSE 109* 128* 116* 101* 120* 106*  BUN 19 20 20 19 16 13   CREATININE 0.72 0.75 0.76 0.85 0.78 0.78  CALCIUM 8.2* 8.3* 7.6* 8.4* 8.3* 8.8*  MG 2.0 1.9 1.7 2.1  --   --   PHOS 2.9 3.5 3.0 3.3  --   --     Liver Function Tests: Recent Labs  Lab 02/10/18 0831 02/11/18 2300  AST 30 25  ALT 34 28  ALKPHOS 97 100  BILITOT 0.5 0.6  PROT 5.9* 6.0*  ALBUMIN 2.3* 2.5*   No results for input(s): LIPASE, AMYLASE in the last 168 hours. No results for input(s): AMMONIA in the last 168 hours.  CBC: Recent Labs  Lab 02/06/18 0322 02/07/18 0351 02/08/18 0550 02/10/18 0831 02/11/18 2300  WBC 9.6 10.6* 15.4* 11.6* 8.1  NEUTROABS  --   --  12.7*  --   --   HGB 9.0* 9.1* 8.4* 9.2* 8.9*  HCT 30.4* 30.3* 27.8* 31.4* 29.9*  MCV 83.3 83.2 83.2 84.6 83.1  PLT 444* 457* 448* 514* 507*    Cardiac Enzymes: No results for input(s): CKTOTAL, CKMB, CKMBINDEX, TROPONINI in the last 168 hours.  BNP (last 3 results) No results for input(s): BNP in the last 8760 hours.  ProBNP (last 3 results) No results for input(s): PROBNP in the last 8760 hours.   Radiological Exams on Admission: Dg Abd 1 View  Result Date: 02/11/2018 CLINICAL DATA:  Encounter for nasogastric tube placement. EXAM: ABDOMEN - 1 VIEW COMPARISON:  Abdominal CT 02/06/2018 FINDINGS: Tip of the weighted enteric tube in the right upper quadrant likely in the first portion of the duodenum. Enteric contrast within small bowel in the central abdomen from modified barium swallow earlier today. No evidence of free air. IMPRESSION: Tip of the weighted enteric tube in the right upper quadrant, likely in the proximal duodenum. Electronically Signed   By: Jeb Levering M.D.   On: 02/11/2018 21:09   Dg Chest Port 1 View  Result Date: 02/09/2018 CLINICAL DATA:   Respiratory failure EXAM: PORTABLE CHEST 1 VIEW COMPARISON:  Yesterday FINDINGS: Tracheostomy tube in good position. Left subclavian line with tip at the SVC. Feeding tube at least reaches the stomach. Normal heart size and stable aortic tortuosity. Status post CABG and aortic valve replacement. Chronic large lung volumes and interstitial coarsening. There is no edema, consolidation, effusion, or pneumothorax. Artifact from EKG leads. IMPRESSION: 1. No evidence of active disease. 2. Stable hardware positioning. 3. COPD. Electronically Signed   By: Monte Fantasia M.D.   On: 02/09/2018 08:40   Dg Swallowing Func-speech Pathology  Result Date: 02/11/2018 Objective Swallowing Evaluation: Type of Study: MBS-Modified Barium Swallow Study  Patient Details Name: Danny Krause MRN: [id number] Date of Birth: 22-Aug-1941 Today's Date: 02/11/2018 Time: SLP Start Time (ACUTE ONLY): 1340 -SLP Stop Time (ACUTE ONLY): 7622 SLP Time Calculation (  min) (ACUTE ONLY): 27 min Past Medical History: Past Medical History: Diagnosis Date . Anginal pain ([place])   with exertion . Coronary artery disease  . Critical aortic valve stenosis  . Dyspnea   with exertion . Murmur  . Seizure ([place])  . Syncope  Past Surgical History: Past Surgical History: Procedure Laterality Date . AORTIC VALVE REPLACEMENT N/A 07/04/2017  Procedure: AORTIC VALVE REPLACEMENT (AVR);  Surgeon: Grace Isaac, MD;  Location: [facility];  Service: Open Heart Surgery;  Laterality: N/A; . CORONARY ARTERY BYPASS GRAFT N/A 07/04/2017  Procedure: CORONARY ARTERY BYPASS GRAFTING (CABG) x three, using left internal mammary artery and right leg greater saphenous vein harvested endoscopically;  Surgeon: Grace Isaac, MD;  Location: [place];  Service: Open Heart Surgery;  Laterality: N/A; . NO PAST SURGERIES   . RIGHT/LEFT HEART CATH AND CORONARY ANGIOGRAPHY N/A 06/25/2017  Procedure: RIGHT/LEFT HEART CATH AND CORONARY ANGIOGRAPHY;  Surgeon: Troy Sine, MD;  Location: [facility];  Service: Cardiovascular;  Laterality: N/A; . TEE WITHOUT CARDIOVERSION N/A 07/04/2017  Procedure: TRANSESOPHAGEAL ECHOCARDIOGRAM (TEE);  Surgeon: Grace Isaac, MD;  Location: [facility];  Service: Open Heart Surgery;  Laterality: N/A; . THORACIC AORTOGRAM  06/25/2017  Procedure: THORACIC AORTOGRAM;  Surgeon: Troy Sine, MD;  Location: [place];  Service: Cardiovascular;;  aortic root  HPI: 77 y/o M with a history of COPD on O2 QHS and CVA with encephalomalacia and seizures (first dx 12/2017) on Keppra with questionable compliance who presented 5/23 after being found unresponsive at home with concern for seizure activity. He was intubated in the ER for hypercapnic respiratory failure.  While in the hospital on 5/27 he developed new left-sided weakness but a CT angio showed no evidence of large vessel occlusion. EEG revealed subclinical status. He was transferred back to the ICU for intubation to allow burst suppression. He required as much as 80 mcg of propofol and 20 mg/hr Versed for burst supression, and neo-synephrine for BP support.  ETT 5/23-24; reintubated 5/27; trach 6/5.  Subjective: alert, communicative Assessment / Plan / Recommendation CHL IP CLINICAL IMPRESSIONS 02/11/2018 Clinical Impression Pt presents with a mild oropharyngeal dysphagia - PMV was in place for duration of study, and VS were stable.  He demonstrated lingual pumping, prolonged oral preparation of pureed solids.  Pharyngeal swallow triggered at the level of the pyriforms for thin liquids, valleculae for nectars.  There was consistent and reliable airway protection with nectar thick liquids with no penetration nor aspiration.  Thin liquids were observed to penetrate the larynx after the swallow.  There was adequate pharyngeal contraction with no residue post-swallow.  For now, recommend initiating a more conservative diet of dysphagia 1 with nectar thick liquds; give meds crushed in puree.  Pt must use  PMV during all PO intake.  D/W RN, daughter.  Pt for likely D/C this afternoon to LTAC.  SLP Visit Diagnosis Dysphagia, oropharyngeal phase (R13.12) Attention and concentration deficit following -- Frontal lobe and executive function deficit following -- Impact on safety and function Mild aspiration risk   CHL IP TREATMENT RECOMMENDATION 02/11/2018 Treatment Recommendations Defer treatment plan to f/u with SLP   No flowsheet data found. CHL IP DIET RECOMMENDATION 02/11/2018 SLP Diet Recommendations Nectar thick liquid;Dysphagia 1 (Puree) solids Liquid Administration via Straw Medication Administration Crushed with puree Compensations Slow rate;Small sips/bites;Minimize environmental distractions Postural Changes Remain semi-upright after after feeds/meals (Comment)   CHL IP OTHER RECOMMENDATIONS 02/11/2018 Recommended Consults -- Oral Care Recommendations --  Other Recommendations Place PMSV during PO intake   CHL IP FOLLOW UP RECOMMENDATIONS 02/11/2018 Follow up Recommendations [facility]   No flowsheet data found.     CHL IP ORAL PHASE 02/11/2018 Oral Phase Impaired Oral - Pudding Teaspoon -- Oral - Pudding Cup -- Oral - Honey Teaspoon -- Oral - Honey Cup -- Oral - Nectar Teaspoon -- Oral - Nectar Cup -- Oral - Nectar Straw -- Oral - Thin Teaspoon -- Oral - Thin Cup -- Oral - Thin Straw -- Oral - Puree Lingual pumping;Delayed oral transit Oral - Mech Soft -- Oral - Regular -- Oral - Multi-Consistency -- Oral - Pill -- Oral Phase - Comment --  CHL IP PHARYNGEAL PHASE 02/11/2018 Pharyngeal Phase Impaired Pharyngeal- Pudding Teaspoon -- Pharyngeal -- Pharyngeal- Pudding Cup -- Pharyngeal -- Pharyngeal- Honey Teaspoon -- Pharyngeal -- Pharyngeal- Honey Cup -- Pharyngeal -- Pharyngeal- Nectar Teaspoon -- Pharyngeal -- Pharyngeal- Nectar Cup -- Pharyngeal -- Pharyngeal- Nectar Straw Delayed swallow initiation-vallecula Pharyngeal -- Pharyngeal- Thin Teaspoon -- Pharyngeal -- Pharyngeal- Thin Cup -- Pharyngeal -- Pharyngeal- Thin  Straw Delayed swallow initiation-pyriform sinuses;Penetration/Apiration after swallow Pharyngeal Material enters airway, remains ABOVE vocal cords then ejected out Pharyngeal- Puree -- Pharyngeal -- Pharyngeal- Mechanical Soft -- Pharyngeal -- Pharyngeal- Regular -- Pharyngeal -- Pharyngeal- Multi-consistency -- Pharyngeal -- Pharyngeal- Pill -- Pharyngeal -- Pharyngeal Comment --  No flowsheet data found. No flowsheet data found. Juan Quam Laurice 02/11/2018, 3:26 PM               Assessment/Plan Principal Problem:   Acute respiratory failure with hypoxia and hypercarbia (HCC) Active Problems:   Seizure (HCC)   COPD, severe (HCC)   Lung nodule seen on imaging study   Tracheobronchitis   Encephalopathy chronic   1. Acute on chronic respiratory failure with hypoxia right now the patient is off the ventilator has been on T collar.  Patient unfortunately has had a lot of issues with secretions and retained secretions are the major problem right now as far as being able to come off of the ventilator.  Patient will be continue with aggressive pulmonary toilet supportive care and will continue with T collar as ordered. 2. Seizure disorder there is no active seizures noted at this time need to continue to monitor patient had apparently had subclinical seizures at presentation. 3. COPD severe oxygen dependent we will continue with oxygen therapy supportive care continue with aggressive pulmonary toilet. 4. Tracheobronchitis this was treated with antibiotics at the transferring facility we will continue to monitor. 5. Chronic encephalopathy patient is at baseline apparently.  We will continue with supportive care. 6. Pulmonary nodules will need to be followed up after discharge  I have personally seen and evaluated the patient, evaluated laboratory and imaging results, formulated the assessment and plan and placed orders. The Patient requires high complexity decision making for assessment and  support.  Case was discussed on Rounds with the Respiratory Therapy Staff Time Spent 70minutes  Allyne Gee, MD [facility] Pulmonary Critical Care Medicine Sleep Medicine

## 2018-02-12 NOTE — Telephone Encounter (Signed)
Pt still admitted as of 02/12/2018

## 2018-02-13 ENCOUNTER — Encounter: Payer: Self-pay | Admitting: Internal Medicine

## 2018-02-13 DIAGNOSIS — G9349 Other encephalopathy: Secondary | ICD-10-CM

## 2018-02-13 DIAGNOSIS — Z4659 Encounter for fitting and adjustment of other gastrointestinal appliance and device: Secondary | ICD-10-CM

## 2018-02-13 HISTORY — DX: Other encephalopathy: G93.49

## 2018-02-13 LAB — BASIC METABOLIC PANEL
Anion gap: 7 (ref 5–15)
BUN: 15 mg/dL (ref 6–20)
CALCIUM: 8.6 mg/dL — AB (ref 8.9–10.3)
CO2: 25 mmol/L (ref 22–32)
CREATININE: 0.77 mg/dL (ref 0.61–1.24)
Chloride: 110 mmol/L (ref 101–111)
GFR calc non Af Amer: >60 mL/min (ref >60)
Glucose, Bld: 104 mg/dL — ABNORMAL HIGH (ref 65–99)
Potassium: 3.6 mmol/L (ref 3.5–5.1)
Sodium: 142 mmol/L (ref 135–145)

## 2018-02-13 LAB — CBC
HCT: 27.5 % — ABNORMAL LOW (ref 39.0–52.0)
Hemoglobin: 8.2 g/dL — ABNORMAL LOW (ref 13.0–17.0)
MCH: 25 pg — AB (ref 26.0–34.0)
MCHC: 29.8 g/dL — AB (ref 30.0–36.0)
MCV: 83.8 fL (ref 78.0–100.0)
PLATELETS: 497 10*3/uL — AB (ref 150–400)
RBC: 3.28 MIL/uL — AB (ref 4.22–5.81)
RDW: 16.9 % — AB (ref 11.5–15.5)
WBC: 8.1 10*3/uL (ref 4.0–10.5)

## 2018-02-13 LAB — TSH: TSH: 3.871 u[IU]/mL (ref 0.350–4.500)

## 2018-02-13 LAB — HEMOGLOBIN A1C
HEMOGLOBIN A1C: 5.9 % — AB (ref 4.8–5.6)
Mean Plasma Glucose: 122.63 mg/dL

## 2018-02-13 LAB — PHOSPHORUS: Phosphorus: 4.3 mg/dL (ref 2.5–4.6)

## 2018-02-13 LAB — MAGNESIUM: MAGNESIUM: 2 mg/dL (ref 1.7–2.4)

## 2018-02-13 NOTE — Progress Notes (Signed)
Pulmonary [place]   PULMONARY SERVICE  PROGRESS NOTE  Date of Service: 02/13/2018  JONATHEN RATHMAN  FWY:[id number]  DOB: [birth year]   DOA: 02/11/2018  Referring Physician: Merton Border, MD  HPI: REMBERTO LIENHARD is a 77 y.o. male seen for follow up of Acute on Chronic Respiratory Failure.  Patient is doing fairly well remains on T collar has been on 28% oxygen.  Secretions are still copious and I think the trach can be downsized so we will try to downsize to a 6 cuffless at this point.  Medications: Reviewed on Rounds  Physical Exam:  Vitals: Temperature is 97.2 pulse 75 respiratory 27 blood pressure 147/79 saturations 100%  Ventilator Settings off of the ventilator on T collar  . General: Comfortable at this time . Eyes: Grossly normal lids, irises & conjunctiva . ENT: grossly tongue is normal . Neck: no obvious mass . Cardiovascular: S1 S2 normal no gallop . Respiratory: Coarse breath sounds no rhonchi are noted . Abdomen: soft . Skin: no rash seen on limited exam . Musculoskeletal: not rigid . Psychiatric:unable to assess . Neurologic: no seizure no involuntary movements         Lab Data:   Basic Metabolic Panel: Recent Labs  Lab 02/07/18 0351 02/08/18 0550 02/09/18 0757 02/10/18 0831 02/11/18 2300 02/12/18 0929 02/13/18 0623  NA 139 137 137 139 142 141 142  K 3.4* 3.3* 4.8 3.5 3.4* 4.3 3.6  CL 109 111 108 109 108 108 110  CO2 22 21* 18* 24 24 22 25   GLUCOSE 128* 116* 101* 120* 106* 121* 104*  BUN 20 20 19 16 13 9 15   CREATININE 0.75 0.76 0.85 0.78 0.78 0.77 0.77  CALCIUM 8.3* 7.6* 8.4* 8.3* 8.8* 9.1 8.6*  MG 1.9 1.7 2.1  --   --   --  2.0  PHOS 3.5 3.0 3.3  --   --   --  4.3    Liver Function Tests: Recent Labs  Lab 02/10/18 0831 02/11/18 2300  AST 30 25  ALT 34 28  ALKPHOS 97 100  BILITOT 0.5 0.6  PROT 5.9* 6.0*  ALBUMIN 2.3* 2.5*   No results for input(s): LIPASE, AMYLASE in the last 168  hours. No results for input(s): AMMONIA in the last 168 hours.  CBC: Recent Labs  Lab 02/07/18 0351 02/08/18 0550 02/10/18 0831 02/11/18 2300 02/13/18 0623  WBC 10.6* 15.4* 11.6* 8.1 8.1  NEUTROABS  --  12.7*  --   --   --   HGB 9.1* 8.4* 9.2* 8.9* 8.2*  HCT 30.3* 27.8* 31.4* 29.9* 27.5*  MCV 83.2 83.2 84.6 83.1 83.8  PLT 457* 448* 514* 507* 497*    Cardiac Enzymes: No results for input(s): CKTOTAL, CKMB, CKMBINDEX, TROPONINI in the last 168 hours.  BNP (last 3 results) No results for input(s): BNP in the last 8760 hours.  ProBNP (last 3 results) No results for input(s): PROBNP in the last 8760 hours.  Radiological Exams: Dg Abd 1 View  Result Date: 02/11/2018 CLINICAL DATA:  Encounter for nasogastric tube placement. EXAM: ABDOMEN - 1 VIEW COMPARISON:  Abdominal CT 02/06/2018 FINDINGS: Tip of the weighted enteric tube in the right upper quadrant likely in the first portion of the duodenum. Enteric contrast within small bowel in the central abdomen from modified barium swallow earlier today. No evidence of free air. IMPRESSION: Tip of the weighted enteric tube in the right upper quadrant, likely in the proximal duodenum. Electronically Signed  By: Jeb Levering M.D.   On: 02/11/2018 21:09   Dg Chest Port 1 View  Result Date: 02/12/2018 CLINICAL DATA:  Hypoxic respiratory failure secondary to seizures. Tracheostomy tube in place. EXAM: PORTABLE CHEST 1 VIEW COMPARISON:  Single-view of the chest 02/09/2018 and 02/08/2018. FINDINGS: Tracheostomy tube and feeding tube are unchanged. Lungs are emphysematous but clear. Heart size is normal. Aortic atherosclerosis noted. No pneumothorax or pleural effusion. The patient is status post CABG. IMPRESSION: No acute disease. Emphysema. Atherosclerosis. Electronically Signed   By: Inge Rise M.D.   On: 02/12/2018 11:05    Assessment/Plan Principal Problem:   Acute respiratory failure with hypoxia and hypercarbia (HCC) Active  Problems:   Seizure (HCC)   COPD, severe (HCC)   Lung nodule seen on imaging study   Tracheobronchitis   Encephalopathy chronic   1. Acute on chronic respiratory failure with hypoxia we will continue with weaning on T collar continue aggressive pulmonary toilet supportive care.  Will go ahead and change the trach to #6 cuffless trach hopefully this may also help with the secretion issue. 2. Seizure disorder clinically stable we will continue to monitor. 3. COPD severe disease continue present management. 4. Tracheal bronchitis stable at this time we will continue to follow. 5. Chronic encephalopathy grossly unchanged we will continue to monitor closely.   I have personally seen and evaluated the patient, evaluated laboratory and imaging results, formulated the assessment and plan and placed orders. The Patient requires high complexity decision making for assessment and support.  Case was discussed on Rounds with the Respiratory Therapy Staff  Allyne Gee, MD [facility] Pulmonary Critical Care Medicine Sleep Medicine

## 2018-02-13 NOTE — Telephone Encounter (Signed)
As of 02/13/18, pt is still admitted.

## 2018-02-14 LAB — CULTURE, RESPIRATORY

## 2018-02-14 LAB — CULTURE, RESPIRATORY W GRAM STAIN

## 2018-02-14 LAB — BASIC METABOLIC PANEL
ANION GAP: 10 (ref 5–15)
BUN: 17 mg/dL (ref 6–20)
CALCIUM: 8.9 mg/dL (ref 8.9–10.3)
CO2: 27 mmol/L (ref 22–32)
Chloride: 105 mmol/L (ref 101–111)
Creatinine, Ser: 0.79 mg/dL (ref 0.61–1.24)
GLUCOSE: 128 mg/dL — AB (ref 65–99)
POTASSIUM: 3.7 mmol/L (ref 3.5–5.1)
Sodium: 142 mmol/L (ref 135–145)

## 2018-02-14 NOTE — Telephone Encounter (Signed)
Patient is now admitted to [facility]. Patient currently does not need this medication and may not need it in the future either. Will close the encounter and if this is needed in the future will start the process again.

## 2018-02-14 NOTE — Telephone Encounter (Signed)
As of 02/14/2018, pt is still admitted.

## 2018-02-14 NOTE — Progress Notes (Signed)
Pulmonary [place]   PULMONARY SERVICE  PROGRESS NOTE  Date of Service: 02/14/2018  Danny Krause  [id number]:[id number]  DOB: [birth year]   DOA: 02/11/2018  Referring Physician: Merton Border, MD  HPI: Danny Krause is a 77 y.o. male seen for follow up of Acute on Chronic Respiratory Failure.  Patient is doing fairly well has been on T collar weaning has 28% FiO2.  Apparently had a choking episode so now he is been made and APO  Medications: Reviewed on Rounds  Physical Exam:  Vitals: Temperature 98.4 pulse 75 respiratory rate 34 blood pressure 156/57 saturations 98%  Ventilator Settings on T collar FiO2 28%  . General: Comfortable at this time . Eyes: Grossly normal lids, irises & conjunctiva . ENT: grossly tongue is normal . Neck: no obvious mass . Cardiovascular: S1 S2 normal no gallop . Respiratory: No rhonchi expansion is equal . Abdomen: soft . Skin: no rash seen on limited exam . Musculoskeletal: not rigid . Psychiatric:unable to assess . Neurologic: no seizure no involuntary movements         Lab Data:   Basic Metabolic Panel: Recent Labs  Lab 02/08/18 0550 02/09/18 0757 02/10/18 0831 02/11/18 2300 02/12/18 0929 02/13/18 0623 02/14/18 0636  NA 137 137 139 142 141 142 142  K 3.3* 4.8 3.5 3.4* 4.3 3.6 3.7  CL 111 108 109 108 108 110 105  CO2 21* 18* 24 24 22 25 27   GLUCOSE 116* 101* 120* 106* 121* 104* 128*  BUN 20 19 16 13 9 15 17   CREATININE 0.76 0.85 0.78 0.78 0.77 0.77 0.79  CALCIUM 7.6* 8.4* 8.3* 8.8* 9.1 8.6* 8.9  MG 1.7 2.1  --   --   --  2.0  --   PHOS 3.0 3.3  --   --   --  4.3  --     Liver Function Tests: Recent Labs  Lab 02/10/18 0831 02/11/18 2300  AST 30 25  ALT 34 28  ALKPHOS 97 100  BILITOT 0.5 0.6  PROT 5.9* 6.0*  ALBUMIN 2.3* 2.5*   No results for input(s): LIPASE, AMYLASE in the last 168 hours. No results for input(s): AMMONIA in the last 168 hours.  CBC: Recent Labs  Lab  02/08/18 0550 02/10/18 0831 02/11/18 2300 02/13/18 0623  WBC 15.4* 11.6* 8.1 8.1  NEUTROABS 12.7*  --   --   --   HGB 8.4* 9.2* 8.9* 8.2*  HCT 27.8* 31.4* 29.9* 27.5*  MCV 83.2 84.6 83.1 83.8  PLT 448* 514* 507* 497*    Cardiac Enzymes: No results for input(s): CKTOTAL, CKMB, CKMBINDEX, TROPONINI in the last 168 hours.  BNP (last 3 results) No results for input(s): BNP in the last 8760 hours.  ProBNP (last 3 results) No results for input(s): PROBNP in the last 8760 hours.  Radiological Exams: No results found.  Assessment/Plan Principal Problem:   Acute respiratory failure with hypoxia and hypercarbia (HCC) Active Problems:   Seizure (HCC)   COPD, severe (HCC)   Lung nodule seen on imaging study   Tracheobronchitis   Encephalopathy chronic   1. Acute on chronic respiratory failure with hypoxia we will continue with weaning on T collar for now will be kept n.p.o. because of aspiration risk 2. Seizure disorder no active seizures at this time 3. Lung nodule stable 4. Tracheobronchitis treated 5. Chronic encephalopathy clinically improving 6. COPD severe we will continue with present management   I have personally seen and  evaluated the patient, evaluated laboratory and imaging results, formulated the assessment and plan and placed orders. The Patient requires high complexity decision making for assessment and support.  Case was discussed on Rounds with the Respiratory Therapy Staff  Allyne Gee, MD [facility] Pulmonary Critical Care Medicine Sleep Medicine

## 2018-02-15 LAB — BASIC METABOLIC PANEL
Anion gap: 7 (ref 5–15)
BUN: 20 mg/dL (ref 6–20)
CHLORIDE: 107 mmol/L (ref 101–111)
CO2: 25 mmol/L (ref 22–32)
CREATININE: 0.79 mg/dL (ref 0.61–1.24)
Calcium: 8.7 mg/dL — ABNORMAL LOW (ref 8.9–10.3)
GFR calc Af Amer: >60 mL/min (ref >60)
GFR calc non Af Amer: >60 mL/min (ref >60)
Glucose, Bld: 122 mg/dL — ABNORMAL HIGH (ref 65–99)
POTASSIUM: 3.7 mmol/L (ref 3.5–5.1)
SODIUM: 139 mmol/L (ref 135–145)

## 2018-02-15 NOTE — Telephone Encounter (Signed)
Still admitted 02/15/2018

## 2018-02-15 NOTE — Progress Notes (Signed)
Pulmonary [place]   PULMONARY SERVICE  PROGRESS NOTE  Date of Service: 02/15/2018  Danny Krause  [id number]:[id number]  DOB: 10/12/1940   DOA: 02/11/2018  Referring Physician: Merton Border, MD  HPI: Danny Krause is a 77 y.o. male seen for follow up of Acute on Chronic Respiratory Failure.  Currently on T collar has been doing fairly well.  He is awake and alert tolerating the PMV.  Is gradually being fed again  Medications: Reviewed on Rounds  Physical Exam:  Vitals: Temperature 97.8 pulse 75 respiratory rate 18 blood pressure 149/78 saturations 96%  Ventilator Settings currently is off the ventilator on T collar 28%  . General: Comfortable at this time . Eyes: Grossly normal lids, irises & conjunctiva . ENT: grossly tongue is normal . Neck: no obvious mass . Cardiovascular: S1 S2 normal no gallop . Respiratory: No rhonchi expansion is equal . Abdomen: soft . Skin: no rash seen on limited exam . Musculoskeletal: not rigid . Psychiatric:unable to assess . Neurologic: no seizure no involuntary movements         Lab Data:   Basic Metabolic Panel: Recent Labs  Lab 02/09/18 0757  02/11/18 2300 02/12/18 0929 02/13/18 0623 02/14/18 0636 02/15/18 0625  NA 137   < > 142 141 142 142 139  K 4.8   < > 3.4* 4.3 3.6 3.7 3.7  CL 108   < > 108 108 110 105 107  CO2 18*   < > 24 22 25 27 25   GLUCOSE 101*   < > 106* 121* 104* 128* 122*  BUN 19   < > 13 9 15 17 20   CREATININE 0.85   < > 0.78 0.77 0.77 0.79 0.79  CALCIUM 8.4*   < > 8.8* 9.1 8.6* 8.9 8.7*  MG 2.1  --   --   --  2.0  --   --   PHOS 3.3  --   --   --  4.3  --   --    < > = values in this interval not displayed.    Liver Function Tests: Recent Labs  Lab 02/10/18 0831 02/11/18 2300  AST 30 25  ALT 34 28  ALKPHOS 97 100  BILITOT 0.5 0.6  PROT 5.9* 6.0*  ALBUMIN 2.3* 2.5*   No results for input(s): LIPASE, AMYLASE in the last 168 hours. No results for  input(s): AMMONIA in the last 168 hours.  CBC: Recent Labs  Lab 02/10/18 0831 02/11/18 2300 02/13/18 0623  WBC 11.6* 8.1 8.1  HGB 9.2* 8.9* 8.2*  HCT 31.4* 29.9* 27.5*  MCV 84.6 83.1 83.8  PLT 514* 507* 497*    Cardiac Enzymes: No results for input(s): CKTOTAL, CKMB, CKMBINDEX, TROPONINI in the last 168 hours.  BNP (last 3 results) No results for input(s): BNP in the last 8760 hours.  ProBNP (last 3 results) No results for input(s): PROBNP in the last 8760 hours.  Radiological Exams: No results found.  Assessment/Plan Principal Problem:   Acute respiratory failure with hypoxia and hypercarbia (HCC) Active Problems:   Seizure (HCC)   COPD, severe (HCC)   Lung nodule seen on imaging study   Tracheobronchitis   Encephalopathy chronic   1. Acute on chronic respiratory failure with hypoxia we will continue to wean as tolerated hopefully can start to Continue Pulmonary Toilet Monitor 2. Seizures No Active Seizures 3. COPD Severe Disease Stable 4. Tracheobronchitis Treated We Will Continue to Follow 5. Encephalopathy Resolved  6. Pulmonary Nodule Follow-Up after discharge   I have personally seen and evaluated the patient, evaluated laboratory and imaging results, formulated the assessment and plan and placed orders. The Patient requires high complexity decision making for assessment and support.  Case was discussed on Rounds with the Respiratory Therapy Staff  Allyne Gee, MD [facility] Pulmonary Critical Care Medicine Sleep Medicine

## 2018-02-16 LAB — BASIC METABOLIC PANEL
ANION GAP: 7 (ref 5–15)
BUN: 22 mg/dL — ABNORMAL HIGH (ref 6–20)
CALCIUM: 9 mg/dL (ref 8.9–10.3)
CO2: 26 mmol/L (ref 22–32)
Chloride: 106 mmol/L (ref 101–111)
Creatinine, Ser: 0.78 mg/dL (ref 0.61–1.24)
GFR calc Af Amer: >60 mL/min (ref >60)
Glucose, Bld: 119 mg/dL — ABNORMAL HIGH (ref 65–99)
Potassium: 4 mmol/L (ref 3.5–5.1)
SODIUM: 139 mmol/L (ref 135–145)

## 2018-02-16 NOTE — Progress Notes (Signed)
Pulmonary [place]   PULMONARY SERVICE  PROGRESS NOTE  Date of Service: 02/16/2018  Danny Krause  FXT:[id number]  DOB: [birth year]   DOA: 02/11/2018  Referring Physician: Merton Border, MD  HPI: Danny Krause is a 77 y.o. male seen for follow up of Acute on Chronic Respiratory Failure.  Right now patient is on T collar.  Has been having aspiration issues.  Yesterday was fed apparently aspirated again so he is now being kept n.p.o.  Medications: Reviewed on Rounds  Physical Exam:  Vitals: Temperature is 97.5 pulse 66 respiratory rate 25 blood pressure 144/75 saturations 98%  Ventilator Settings off of the ventilator right now on T collar  . General: Comfortable at this time . Eyes: Grossly normal lids, irises & conjunctiva . ENT: grossly tongue is normal . Neck: no obvious mass . Cardiovascular: S1 S2 normal no gallop . Respiratory: Coarse breath sounds . Abdomen: soft . Skin: no rash seen on limited exam . Musculoskeletal: not rigid . Psychiatric:unable to assess . Neurologic: no seizure no involuntary movements         Lab Data:   Basic Metabolic Panel: Recent Labs  Lab 02/12/18 0929 02/13/18 0623 02/14/18 0636 02/15/18 0625 02/16/18 0558  NA 141 142 142 139 139  K 4.3 3.6 3.7 3.7 4.0  CL 108 110 105 107 106  CO2 22 25 27 25 26   GLUCOSE 121* 104* 128* 122* 119*  BUN 9 15 17 20  22*  CREATININE 0.77 0.77 0.79 0.79 0.78  CALCIUM 9.1 8.6* 8.9 8.7* 9.0  MG  --  2.0  --   --   --   PHOS  --  4.3  --   --   --     Liver Function Tests: Recent Labs  Lab 02/10/18 0831 02/11/18 2300  AST 30 25  ALT 34 28  ALKPHOS 97 100  BILITOT 0.5 0.6  PROT 5.9* 6.0*  ALBUMIN 2.3* 2.5*   No results for input(s): LIPASE, AMYLASE in the last 168 hours. No results for input(s): AMMONIA in the last 168 hours.  CBC: Recent Labs  Lab 02/10/18 0831 02/11/18 2300 02/13/18 0623  WBC 11.6* 8.1 8.1  HGB 9.2* 8.9* 8.2*   HCT 31.4* 29.9* 27.5*  MCV 84.6 83.1 83.8  PLT 514* 507* 497*    Cardiac Enzymes: No results for input(s): CKTOTAL, CKMB, CKMBINDEX, TROPONINI in the last 168 hours.  BNP (last 3 results) No results for input(s): BNP in the last 8760 hours.  ProBNP (last 3 results) No results for input(s): PROBNP in the last 8760 hours.  Radiological Exams: No results found.  Assessment/Plan Principal Problem:   Acute respiratory failure with hypoxia and hypercarbia (HCC) Active Problems:   Seizure (HCC)   COPD, severe (HCC)   Lung nodule seen on imaging study   Tracheobronchitis   Encephalopathy chronic   1. Acute on chronic respiratory failure with hypoxia we will continue with T collar no feeding at this time.  Continue with aggressive pulmonary toilet. 2. Recurring aspiration will continue with supportive care we will keep patient n.p.o. 3. Seizure disorder no active seizures 4. COPD severe disease we will monitor 5. Chronic encephalopathy cleared up we will monitor 6. Tracheobronchitis has been treated with antibiotics we will continue present therapy   I have personally seen and evaluated the patient, evaluated laboratory and imaging results, formulated the assessment and plan and placed orders. The Patient requires high complexity decision making for assessment and  support.  Case was discussed on Rounds with the Respiratory Therapy Staff  Allyne Gee, MD [facility] Pulmonary Critical Care Medicine Sleep Medicine

## 2018-02-18 LAB — CBC
HCT: 32.1 % — ABNORMAL LOW (ref 39.0–52.0)
Hemoglobin: 9.8 g/dL — ABNORMAL LOW (ref 13.0–17.0)
MCH: 25.6 pg — AB (ref 26.0–34.0)
MCHC: 30.5 g/dL (ref 30.0–36.0)
MCV: 83.8 fL (ref 78.0–100.0)
PLATELETS: 491 10*3/uL — AB (ref 150–400)
RBC: 3.83 MIL/uL — ABNORMAL LOW (ref 4.22–5.81)
RDW: 16.9 % — AB (ref 11.5–15.5)
WBC: 7.9 10*3/uL (ref 4.0–10.5)

## 2018-02-18 LAB — BASIC METABOLIC PANEL
Anion gap: 10 (ref 5–15)
BUN: 23 mg/dL — AB (ref 6–20)
CALCIUM: 8.7 mg/dL — AB (ref 8.9–10.3)
CO2: 24 mmol/L (ref 22–32)
CREATININE: 0.8 mg/dL (ref 0.61–1.24)
Chloride: 106 mmol/L (ref 101–111)
Glucose, Bld: 109 mg/dL — ABNORMAL HIGH (ref 65–99)
Potassium: 4.2 mmol/L (ref 3.5–5.1)
Sodium: 140 mmol/L (ref 135–145)

## 2018-02-18 LAB — PHOSPHORUS: Phosphorus: 3.7 mg/dL (ref 2.5–4.6)

## 2018-02-18 LAB — MAGNESIUM: Magnesium: 2.3 mg/dL (ref 1.7–2.4)

## 2018-02-18 NOTE — Progress Notes (Signed)
Pulmonary [place]   PULMONARY SERVICE  PROGRESS NOTE  Date of Service: 02/18/2018  Danny Krause  [id number]:[id number]  DOB: 08-Sep-1940   DOA: 02/11/2018  Referring Physician: Merton Border, MD  HPI: Danny Krause is a 77 y.o. male seen for follow up of Acute on Chronic Respiratory Failure.  Comfortable without distress patient is on T collar.  Apparently had again choking sensation patient has been more confused also today.  Medications: Reviewed on Rounds  Physical Exam:  Vitals: Temperature is 97.3 pulse 71 respiratory rate 29 blood pressure 159/84 saturations 98%  Ventilator Settings off of the ventilator on T collar 28% FiO2  . General: Comfortable at this time . Eyes: Grossly normal lids, irises & conjunctiva . ENT: grossly tongue is normal . Neck: no obvious mass . Cardiovascular: S1 S2 normal no gallop . Respiratory: No rhonchi or rales noted expansion is equal . Abdomen: soft . Skin: no rash seen on limited exam . Musculoskeletal: not rigid . Psychiatric:unable to assess . Neurologic: no seizure no involuntary movements         Lab Data:   Basic Metabolic Panel: Recent Labs  Lab 02/13/18 0623 02/14/18 0636 02/15/18 0625 02/16/18 0558 02/18/18 0511  NA 142 142 139 139 140  K 3.6 3.7 3.7 4.0 4.2  CL 110 105 107 106 106  CO2 25 27 25 26 24   GLUCOSE 104* 128* 122* 119* 109*  BUN 15 17 20  22* 23*  CREATININE 0.77 0.79 0.79 0.78 0.80  CALCIUM 8.6* 8.9 8.7* 9.0 8.7*  MG 2.0  --   --   --  2.3  PHOS 4.3  --   --   --  3.7    Liver Function Tests: Recent Labs  Lab 02/11/18 2300  AST 25  ALT 28  ALKPHOS 100  BILITOT 0.6  PROT 6.0*  ALBUMIN 2.5*   No results for input(s): LIPASE, AMYLASE in the last 168 hours. No results for input(s): AMMONIA in the last 168 hours.  CBC: Recent Labs  Lab 02/11/18 2300 02/13/18 0623 02/18/18 0511  WBC 8.1 8.1 7.9  HGB 8.9* 8.2* 9.8*  HCT 29.9* 27.5* 32.1*  MCV  83.1 83.8 83.8  PLT 507* 497* 491*    Cardiac Enzymes: No results for input(s): CKTOTAL, CKMB, CKMBINDEX, TROPONINI in the last 168 hours.  BNP (last 3 results) No results for input(s): BNP in the last 8760 hours.  ProBNP (last 3 results) No results for input(s): PROBNP in the last 8760 hours.  Radiological Exams: No results found.  Assessment/Plan Principal Problem:   Acute respiratory failure with hypoxia and hypercarbia (HCC) Active Problems:   Seizure (HCC)   COPD, severe (HCC)   Lung nodule seen on imaging study   Tracheobronchitis   Encephalopathy chronic   1. Acute on chronic respiratory failure with hypoxia we will continue with T collar continue pulmonary toilet secretion management.  Overall patient prognosis guarded. 2. Seizure disorder we will continue with full support 3. COPD severe disease continue present management 4. Tracheobronchitis treated 5. Encephalopathy he is still somewhat confused. 6. Pulmonary nodule follow-up after discharge   I have personally seen and evaluated the patient, evaluated laboratory and imaging results, formulated the assessment and plan and placed orders. The Patient requires high complexity decision making for assessment and support.  Case was discussed on Rounds with the Respiratory Therapy Staff  Allyne Gee, MD [facility] Pulmonary Critical Care Medicine Sleep Medicine

## 2018-02-18 NOTE — Telephone Encounter (Signed)
As of 02/18/18, pt is still admitted.

## 2018-02-19 NOTE — Telephone Encounter (Signed)
Pt is still admitted as of 02/19/18

## 2018-02-20 LAB — BASIC METABOLIC PANEL
ANION GAP: 12 (ref 5–15)
BUN: 32 mg/dL — AB (ref 6–20)
CO2: 28 mmol/L (ref 22–32)
Calcium: 9.1 mg/dL (ref 8.9–10.3)
Chloride: 103 mmol/L (ref 101–111)
Creatinine, Ser: 0.85 mg/dL (ref 0.61–1.24)
GFR calc Af Amer: >60 mL/min (ref >60)
GFR calc non Af Amer: >60 mL/min (ref >60)
GLUCOSE: 107 mg/dL — AB (ref 65–99)
POTASSIUM: 3.8 mmol/L (ref 3.5–5.1)
Sodium: 143 mmol/L (ref 135–145)

## 2018-02-20 LAB — CBC
HEMATOCRIT: 35 % — AB (ref 39.0–52.0)
Hemoglobin: 10.6 g/dL — ABNORMAL LOW (ref 13.0–17.0)
MCH: 25.4 pg — AB (ref 26.0–34.0)
MCHC: 30.3 g/dL (ref 30.0–36.0)
MCV: 83.7 fL (ref 78.0–100.0)
PLATELETS: 399 10*3/uL (ref 150–400)
RBC: 4.18 MIL/uL — AB (ref 4.22–5.81)
RDW: 17.2 % — ABNORMAL HIGH (ref 11.5–15.5)
WBC: 7.3 10*3/uL (ref 4.0–10.5)

## 2018-02-20 LAB — MAGNESIUM: Magnesium: 2.3 mg/dL (ref 1.7–2.4)

## 2018-02-20 LAB — PHENYTOIN LEVEL, TOTAL: Phenytoin Lvl: 5.6 ug/mL — ABNORMAL LOW (ref 10.0–20.0)

## 2018-02-20 NOTE — Telephone Encounter (Signed)
Patient is still admitted.

## 2018-02-22 NOTE — Telephone Encounter (Signed)
Pt is still admitted as of 02/22/18.

## 2018-02-24 LAB — BASIC METABOLIC PANEL
ANION GAP: 9 (ref 5–15)
BUN: 26 mg/dL — ABNORMAL HIGH (ref 6–20)
CO2: 26 mmol/L (ref 22–32)
Calcium: 9 mg/dL (ref 8.9–10.3)
Chloride: 104 mmol/L (ref 101–111)
Creatinine, Ser: 0.75 mg/dL (ref 0.61–1.24)
GLUCOSE: 124 mg/dL — AB (ref 65–99)
POTASSIUM: 4 mmol/L (ref 3.5–5.1)
Sodium: 139 mmol/L (ref 135–145)

## 2018-02-24 LAB — CBC
HCT: 33.5 % — ABNORMAL LOW (ref 39.0–52.0)
Hemoglobin: 10.1 g/dL — ABNORMAL LOW (ref 13.0–17.0)
MCH: 25.3 pg — ABNORMAL LOW (ref 26.0–34.0)
MCHC: 30.1 g/dL (ref 30.0–36.0)
MCV: 84 fL (ref 78.0–100.0)
PLATELETS: 299 10*3/uL (ref 150–400)
RBC: 3.99 MIL/uL — AB (ref 4.22–5.81)
RDW: 16.7 % — ABNORMAL HIGH (ref 11.5–15.5)
WBC: 7 10*3/uL (ref 4.0–10.5)

## 2018-02-24 LAB — LEVETIRACETAM LEVEL: LEVETIRACETAM: 43.8 ug/mL — AB (ref 10.0–40.0)

## 2018-02-25 NOTE — Telephone Encounter (Signed)
Still admitted as of today.

## 2018-02-25 NOTE — Progress Notes (Signed)
Pulmonary [name]   PULMONARY SERVICE  PROGRESS NOTE  Date of Service: 02/25/2018  Danny Krause  [id number]  DOB: 1941-05-11   DOA: 02/11/2018  Referring Physician: Merton Border, MD  HPI: Danny Krause is a 77 y.o. male seen for follow up of Acute on Chronic Respiratory Failure.  Patient has been weaning on T collar currently requiring 28% FiO2 supposed to be also getting a barium swallow study evaluation for aspiration.  Medications: Reviewed on Rounds  Physical Exam:  Vitals: Temperature 97.6 pulse 67 respiratory rate 26 blood pressure 134/83 saturations 98%  Ventilator Settings currently patient is on T collar has been on 20% FiO2  . General: Comfortable at this time . Eyes: Grossly normal lids, irises & conjunctiva . ENT: grossly tongue is normal . Neck: no obvious mass . Cardiovascular: S1 S2 normal no gallop . Respiratory: No rhonchi are noted at this time . Abdomen: soft . Skin: no rash seen on limited exam . Musculoskeletal: not rigid . Psychiatric:unable to assess . Neurologic: no seizure no involuntary movements         Lab Data:   Basic Metabolic Panel: Recent Labs  Lab 02/20/18 1356 02/24/18 1004  NA 143 139  K 3.8 4.0  CL 103 104  CO2 28 26  GLUCOSE 107* 124*  BUN 32* 26*  CREATININE 0.85 0.75  CALCIUM 9.1 9.0  MG 2.3  --     Liver Function Tests: No results for input(s): AST, ALT, ALKPHOS, BILITOT, PROT, ALBUMIN in the last 168 hours. No results for input(s): LIPASE, AMYLASE in the last 168 hours. No results for input(s): AMMONIA in the last 168 hours.  CBC: Recent Labs  Lab 02/20/18 1356 02/24/18 1004  WBC 7.3 7.0  HGB 10.6* 10.1*  HCT 35.0* 33.5*  MCV 83.7 84.0  PLT 399 299    Cardiac Enzymes: No results for input(s): CKTOTAL, CKMB, CKMBINDEX, TROPONINI in the last 168 hours.  BNP (last 3 results) No results for input(s): BNP in the last 8760 hours.  ProBNP (last 3  results) No results for input(s): PROBNP in the last 8760 hours.  Radiological Exams: No results found.  Assessment/Plan Principal Problem:   Acute respiratory failure with hypoxia and hypercarbia (HCC) Active Problems:   Seizure (HCC)   COPD, severe (HCC)   Lung nodule seen on imaging study   Tracheobronchitis   Encephalopathy chronic   1. Acute on chronic respiratory failure with hypoxia patient will be continued with T collar as mentioned above we will downsize trach to a #4 cuffless. 2. Seizure disorder stable no active seizures 3. COPD severe disease we will continue to monitor 4. 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 Chronic encephalopathy stable seems to be improved clinically   I have personally seen and evaluated the patient, evaluated laboratory and imaging results, formulated the assessment and plan and placed orders. The Patient requires high complexity decision making for assessment and support.  Case was discussed on Rounds with the Respiratory Therapy Staff  Allyne Gee, MD [facility] Pulmonary Critical Care Medicine Sleep Medicine

## 2018-02-26 ENCOUNTER — Other Ambulatory Visit (HOSPITAL_COMMUNITY): Payer: Medicare Other

## 2018-02-26 NOTE — Telephone Encounter (Signed)
Admitted to hospital as of today

## 2018-02-26 NOTE — Progress Notes (Signed)
Pulmonary [place]   PULMONARY SERVICE  PROGRESS NOTE  Date of Service: 02/26/2018  HAYTHAM MAHER  [id number]  DOB: October 01, 1940   DOA: 02/11/2018  Referring Physician: Merton Border, MD  HPI: Danny Krause is a 77 y.o. male seen for follow up of Acute on Chronic Respiratory Failure.  Continues on T collar currently is on 28% FiO2.  Patient has been tolerating the PMV fairly well.  Patient right now has a #4 trach in place also had the modified barium swallow done which the patient was able to pass  Medications: Reviewed on Rounds  Physical Exam:  Vitals: Temperature 96.3 pulse 64 respiratory rate 15 blood pressure 119/63 saturations are 99%  Ventilator Settings currently is off of the ventilator on T collar  . General: Comfortable at this time . Eyes: Grossly normal lids, irises & conjunctiva . ENT: grossly tongue is normal . Neck: no obvious mass . Cardiovascular: S1 S2 normal no gallop . Respiratory: No rhonchi or rales . Abdomen: soft . Skin: no rash seen on limited exam . Musculoskeletal: not rigid . Psychiatric:unable to assess . Neurologic: no seizure no involuntary movements         Lab Data:   Basic Metabolic Panel: Recent Labs  Lab 02/20/18 1356 02/24/18 1004  NA 143 139  K 3.8 4.0  CL 103 104  CO2 28 26  GLUCOSE 107* 124*  BUN 32* 26*  CREATININE 0.85 0.75  CALCIUM 9.1 9.0  MG 2.3  --     Liver Function Tests: No results for input(s): AST, ALT, ALKPHOS, BILITOT, PROT, ALBUMIN in the last 168 hours. No results for input(s): LIPASE, AMYLASE in the last 168 hours. No results for input(s): AMMONIA in the last 168 hours.  CBC: Recent Labs  Lab 02/20/18 1356 02/24/18 1004  WBC 7.3 7.0  HGB 10.6* 10.1*  HCT 35.0* 33.5*  MCV 83.7 84.0  PLT 399 299    Cardiac Enzymes: No results for input(s): CKTOTAL, CKMB, CKMBINDEX, TROPONINI in the last 168 hours.  BNP (last 3 results) No results for  input(s): BNP in the last 8760 hours.  ProBNP (last 3 results) No results for input(s): PROBNP in the last 8760 hours.  Radiological Exams: No results found.  Assessment/Plan Principal Problem:   Acute respiratory failure with hypoxia and hypercarbia (HCC) Active Problems:   Seizure (HCC)   COPD, severe (HCC)   Lung nodule seen on imaging study   Tracheobronchitis   Encephalopathy chronic   1. Acute on chronic respiratory failure with hypoxia we will be continuing with T collar trials as ordered.  We will continue with the PMV has been tolerating the #4 trach in place.  We will continue secretion management pulmonary toilet and follow along. 2. Seizure disorder stable we will continue to follow 3. COPD severe disease at baseline we will continue present management 4. Chronic encephalopathy clinically improving 5. Tracheobronchitis treated with antibiotics we will follow   I have personally seen and evaluated the patient, evaluated laboratory and imaging results, formulated the assessment and plan and placed orders. The Patient requires high complexity decision making for assessment and support.  Case was discussed on Rounds with the Respiratory Therapy Staff  Allyne Gee, MD [facility] Pulmonary Critical Care Medicine Sleep Medicine

## 2018-02-27 NOTE — Telephone Encounter (Signed)
Still admitted as of today

## 2018-02-27 NOTE — Progress Notes (Signed)
Pulmonary [place]   PULMONARY SERVICE  PROGRESS NOTE  Date of Service: 02/27/2018  Danny Krause  [id number]  DOB: [birth year]   DOA: 02/11/2018  Referring Physician: Merton Border, MD  HPI: Danny Krause is a 78 y.o. male seen for follow up of Acute on Chronic Respiratory Failure.  Currently on T collar patient is on 28% FiO2 we should be hopefully able to try capping on him he has been tolerating the PMV very well and has been doing very well overall on the on the 28% FiO2  Medications: Reviewed on Rounds  Physical Exam:  Vitals: Temperature is 97.2 pulse 64 respiratory 18 blood pressure 171/76 saturations 97%  Ventilator Settings off of the ventilator  . General: Comfortable at this time . Eyes: Grossly normal lids, irises & conjunctiva . ENT: grossly tongue is normal . Neck: no obvious mass . Cardiovascular: S1 S2 normal no gallop . Respiratory: Good air entry no rhonchi . Abdomen: soft . Skin: no rash seen on limited exam . Musculoskeletal: not rigid . Psychiatric:unable to assess . Neurologic: no seizure no involuntary movements         Lab Data:   Basic Metabolic Panel: Recent Labs  Lab 02/24/18 1004  NA 139  K 4.0  CL 104  CO2 26  GLUCOSE 124*  BUN 26*  CREATININE 0.75  CALCIUM 9.0    Liver Function Tests: No results for input(s): AST, ALT, ALKPHOS, BILITOT, PROT, ALBUMIN in the last 168 hours. No results for input(s): LIPASE, AMYLASE in the last 168 hours. No results for input(s): AMMONIA in the last 168 hours.  CBC: Recent Labs  Lab 02/24/18 1004  WBC 7.0  HGB 10.1*  HCT 33.5*  MCV 84.0  PLT 299    Cardiac Enzymes: No results for input(s): CKTOTAL, CKMB, CKMBINDEX, TROPONINI in the last 168 hours.  BNP (last 3 results) No results for input(s): BNP in the last 8760 hours.  ProBNP (last 3 results) No results for input(s): PROBNP in the last 8760 hours.  Radiological Exams: No  results found.  Assessment/Plan Principal Problem:   Acute respiratory failure with hypoxia and hypercarbia (HCC) Active Problems:   Seizure (HCC)   COPD, severe (HCC)   Lung nodule seen on imaging study   Tracheobronchitis   Encephalopathy chronic   1. Acute on chronic respiratory failure with hypoxia we will continue with T collar trials and begin capping today since patient has been doing well with the T collar trials. 2. 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 COPD severe disease continue present management 4. Pulmonary nodules follow-up after discharge 5. Encephalopathy still present but improving 6. Tracheobronchitis treated   I have personally seen and evaluated the patient, evaluated laboratory and imaging results, formulated the assessment and plan and placed orders. The Patient requires high complexity decision making for assessment and support.  Case was discussed on Rounds with the Respiratory Therapy Staff  Allyne Gee, MD [facility] Pulmonary Critical Care Medicine Sleep Medicine

## 2018-02-28 LAB — PHENYTOIN LEVEL, TOTAL: Phenytoin Lvl: 15.1 ug/mL (ref 10.0–20.0)

## 2018-02-28 NOTE — Telephone Encounter (Signed)
Patient still admitted under [facility]

## 2018-03-01 NOTE — Progress Notes (Signed)
[name]   PULMONARY SERVICE  PROGRESS NOTE  Date of Service: 03/01/2018  EDWAR Krause  BCW:[id number]  DOB: Jun 15, 1941   DOA: 02/11/2018  Referring Physician: Merton Border, MD  HPI: Danny Krause is a 77 y.o. male seen for follow up of Acute on Chronic Respiratory Failure.  Patient is capping doing fairly well has been capped for more than 48 hours at this time  Medications: Reviewed on Rounds  Physical Exam:  Vitals: Temperature 97.4 pulse 87 respiratory 19 blood pressure 140/73 saturations 99%  Ventilator Settings off of the ventilator at this time  . General: Comfortable at this time . Eyes: Grossly normal lids, irises & conjunctiva . ENT: grossly tongue is normal . Neck: no obvious mass . Cardiovascular: S1 S2 normal no gallop . Respiratory: No rhonchi expansion is equal . Abdomen: soft . Skin: no rash seen on limited exam . Musculoskeletal: not rigid . Psychiatric:unable to assess . Neurologic: no seizure no involuntary movements         Lab Data:   Basic Metabolic Panel: Recent Labs  Lab 02/24/18 1004  NA 139  K 4.0  CL 104  CO2 26  GLUCOSE 124*  BUN 26*  CREATININE 0.75  CALCIUM 9.0    Liver Function Tests: No results for input(s): AST, ALT, ALKPHOS, BILITOT, PROT, ALBUMIN in the last 168 hours. No results for input(s): LIPASE, AMYLASE in the last 168 hours. No results for input(s): AMMONIA in the last 168 hours.  CBC: Recent Labs  Lab 02/24/18 1004  WBC 7.0  HGB 10.1*  HCT 33.5*  MCV 84.0  PLT 299    Cardiac Enzymes: No results for input(s): CKTOTAL, CKMB, CKMBINDEX, TROPONINI in the last 168 hours.  BNP (last 3 results) No results for input(s): BNP in the last 8760 hours.  ProBNP (last 3 results) No results for input(s): PROBNP in the last 8760 hours.  Radiological Exams: No results found.  Assessment/Plan Principal Problem:   Acute respiratory failure with hypoxia  and hypercarbia (HCC) Active Problems:   Seizure (HCC)   COPD, severe (HCC)   Lung nodule seen on imaging study   Tracheobronchitis   Encephalopathy chronic   1. Acute on chronic respiratory failure with hypoxia we will continue with capping trials as ordered.  Patient is going to hopefully be able to be decannulated over the weekend 2. Seizures no active seizures are noted. 3. COPD severe disease we will continue present management 4. Tracheobronchitis resolved 5. Chronic encephalopathy clinically improving slowly   I have personally seen and evaluated the patient, evaluated laboratory and imaging results, formulated the assessment and plan and placed orders. The Patient requires high complexity decision making for assessment and support.  Case was discussed on Rounds with the Respiratory Therapy Staff  Allyne Gee, MD [place] Pulmonary Critical Care Medicine Sleep Medicine

## 2018-03-02 NOTE — Progress Notes (Signed)
Pulmonary [place]   PULMONARY SERVICE  PROGRESS NOTE  Date of Service: 03/02/2018  Danny Krause  AVW:[id number]  DOB: Aug 20, 1941   DOA: 02/11/2018  Referring Physician: Merton Border, MD  HPI: Danny Krause is a 77 y.o. male seen for follow up of Acute on Chronic Respiratory Failure.  Patient is resting no distress at this time has been decannulated.  Looks good the stoma is closing nicely comfortable without distress  Medications: Reviewed on Rounds  Physical Exam:  Vitals:  Temperature 98.0 degrees pulse 66 respiratory rate 16 blood pressure 138/76 saturations 99%  Ventilator Settings  Off the ventilator decannulated  . General: Comfortable at this time . Eyes: Grossly normal lids, irises & conjunctiva . ENT: grossly tongue is normal . Neck: no obvious mass . Cardiovascular: S1 S2 normal no gallop . Respiratory:  No rhonchi or rales are noted at this time . Abdomen: soft . Skin: no rash seen on limited exam . Musculoskeletal: not rigid . Psychiatric:unable to assess . Neurologic: no seizure no involuntary movements         Lab Data:   Basic Metabolic Panel: Recent Labs  Lab 02/24/18 1004  NA 139  K 4.0  CL 104  CO2 26  GLUCOSE 124*  BUN 26*  CREATININE 0.75  CALCIUM 9.0    Liver Function Tests: No results for input(s): AST, ALT, ALKPHOS, BILITOT, PROT, ALBUMIN in the last 168 hours. No results for input(s): LIPASE, AMYLASE in the last 168 hours. No results for input(s): AMMONIA in the last 168 hours.  CBC: Recent Labs  Lab 02/24/18 1004  WBC 7.0  HGB 10.1*  HCT 33.5*  MCV 84.0  PLT 299    Cardiac Enzymes: No results for input(s): CKTOTAL, CKMB, CKMBINDEX, TROPONINI in the last 168 hours.  BNP (last 3 results) No results for input(s): BNP in the last 8760 hours.  ProBNP (last 3 results) No results for input(s): PROBNP in the last 8760 hours.  Radiological Exams: No results  found.  Assessment/Plan Principal Problem:   Acute respiratory failure with hypoxia and hypercarbia (HCC) Active Problems:   Seizure (HCC)   COPD, severe (HCC)   Lung nodule seen on imaging study   Tracheobronchitis   Encephalopathy chronic   1.  acute on chronic Respiratory failure with hypoxia patient has been successfully decannulated doing well.  We will continue with supportive care monitor this time for closure.  Patient right now is on room air  2. Seizures no active seizures are noted.   3. COPD severe disease we will continue present management 4. Pulmonary nodule will need to follow-up after discharge  5. Tracheobronchitis treated 6. Encephalopathy clinically improved   I have personally seen and evaluated the patient, evaluated laboratory and imaging results, formulated the assessment and plan and placed orders. The Patient requires high complexity decision making for assessment and support.  Case was discussed on Rounds with the Respiratory Therapy Staff  Allyne Gee, MD [facility] Pulmonary Critical Care Medicine Sleep Medicine

## 2018-03-03 LAB — CBC
HCT: 34.1 % — ABNORMAL LOW (ref 39.0–52.0)
HEMOGLOBIN: 10.2 g/dL — AB (ref 13.0–17.0)
MCH: 26.1 pg (ref 26.0–34.0)
MCHC: 29.9 g/dL — ABNORMAL LOW (ref 30.0–36.0)
MCV: 87.2 fL (ref 78.0–100.0)
Platelets: 229 10*3/uL (ref 150–400)
RBC: 3.91 MIL/uL — AB (ref 4.22–5.81)
RDW: 17.5 % — ABNORMAL HIGH (ref 11.5–15.5)
WBC: 5.6 10*3/uL (ref 4.0–10.5)

## 2018-03-03 LAB — BASIC METABOLIC PANEL
Anion gap: 7 (ref 5–15)
BUN: 32 mg/dL — AB (ref 8–23)
CHLORIDE: 106 mmol/L (ref 98–111)
CO2: 27 mmol/L (ref 22–32)
Calcium: 9.4 mg/dL (ref 8.9–10.3)
Creatinine, Ser: 0.87 mg/dL (ref 0.61–1.24)
GFR calc Af Amer: >60 mL/min (ref >60)
GFR calc non Af Amer: >60 mL/min (ref >60)
Glucose, Bld: 102 mg/dL — ABNORMAL HIGH (ref 70–99)
POTASSIUM: 4 mmol/L (ref 3.5–5.1)
Sodium: 140 mmol/L (ref 135–145)

## 2018-03-04 NOTE — Telephone Encounter (Signed)
Pt is still admitted as of today.

## 2018-03-05 NOTE — Telephone Encounter (Signed)
I would like for him to complete the CT ideally over the next 2 to 3 weeks.  For serial imaging.  Patient also needs hospital follow-up appointment with Dr. Lake Bells, myself, or another app.  Wyn Quaker, FNP

## 2018-03-05 NOTE — Telephone Encounter (Signed)
Pt was discharged from the hospital 03/04/18 after spending 21 days in the hospital.  Aaron Edelman, please advise how soon you want pt to be scheduled to have the CT done.  Thanks!

## 2018-03-05 NOTE — Telephone Encounter (Addendum)
Called Stacey at [facility] to see if a new order needed to be placed for pt to have CT scan done and per [facility] the order should be good.  Stated to [place] pt needs to have scan within the next 2 to 3 weeks.  Per Erline Levine, scan can be done 7/18 at 11:30 and have pt arrive at 11:15.  Called pt and spoke with his spouse checking to see how pt was doing after being out of the hospital and Mrs. Dass stated pt is doing much better and has even been eating well since been at home.  Mrs. Rogala stated pt was apparently having nightmares while in the hospital due to some of the meds he was on but pt did not tell anyone while he was admitted.  Stated to Mrs. Hicks we were needing to reschedule pt's CT scan and also to get HFU appt made at our office for pt.  Asked Mrs. Zietlow if pt could arrive for CT 7/18 at 11:30 and Mrs. Miklas stated that should work for pt. Stated to her for pt to arrive at 11:15. Gave her Stacey's phone number if the date needed to be changed.  Also scheduled HFU for pt to see Wyn Quaker 7/25 at 3:30.  Stated to Mrs. Cicero if the appt needed to be changed for her or pt to contact our office and we could reschedule the appt.  Nothing further needed at this time.

## 2018-03-11 ENCOUNTER — Telehealth: Payer: Self-pay | Admitting: *Deleted

## 2018-03-11 DIAGNOSIS — Z953 Presence of xenogenic heart valve: Secondary | ICD-10-CM | POA: Diagnosis not present

## 2018-03-11 DIAGNOSIS — Z951 Presence of aortocoronary bypass graft: Secondary | ICD-10-CM | POA: Diagnosis not present

## 2018-03-11 DIAGNOSIS — J449 Chronic obstructive pulmonary disease, unspecified: Secondary | ICD-10-CM | POA: Diagnosis not present

## 2018-03-11 DIAGNOSIS — M6281 Muscle weakness (generalized): Secondary | ICD-10-CM | POA: Diagnosis not present

## 2018-03-11 DIAGNOSIS — R1310 Dysphagia, unspecified: Secondary | ICD-10-CM | POA: Diagnosis not present

## 2018-03-11 DIAGNOSIS — I2510 Atherosclerotic heart disease of native coronary artery without angina pectoris: Secondary | ICD-10-CM | POA: Diagnosis not present

## 2018-03-11 DIAGNOSIS — E43 Unspecified severe protein-calorie malnutrition: Secondary | ICD-10-CM | POA: Diagnosis not present

## 2018-03-11 DIAGNOSIS — J9611 Chronic respiratory failure with hypoxia: Secondary | ICD-10-CM | POA: Diagnosis not present

## 2018-03-11 DIAGNOSIS — Z430 Encounter for attention to tracheostomy: Secondary | ICD-10-CM | POA: Diagnosis not present

## 2018-03-11 DIAGNOSIS — Z9181 History of falling: Secondary | ICD-10-CM | POA: Diagnosis not present

## 2018-03-11 DIAGNOSIS — G40909 Epilepsy, unspecified, not intractable, without status epilepticus: Secondary | ICD-10-CM | POA: Diagnosis not present

## 2018-03-11 DIAGNOSIS — Z9981 Dependence on supplemental oxygen: Secondary | ICD-10-CM | POA: Diagnosis not present

## 2018-03-11 DIAGNOSIS — Z8673 Personal history of transient ischemic attack (TIA), and cerebral infarction without residual deficits: Secondary | ICD-10-CM | POA: Diagnosis not present

## 2018-03-11 DIAGNOSIS — F329 Major depressive disorder, single episode, unspecified: Secondary | ICD-10-CM | POA: Diagnosis not present

## 2018-03-11 NOTE — Telephone Encounter (Signed)
Son called back. He realized 04/10/18 does not work for him. R.s appt for 04/19/18 at 11:30am for check in for a 12pm appt. He verbalized understanding.

## 2018-03-11 NOTE — Telephone Encounter (Signed)
Tried calling wife number. Phone continued to ring and could not LVM. Called son, Aaron Edelman (on [place]).  Made hospital f/u appt for 04/10/18 at 3:00pm.  Cx appt that was with CM,NP on 04/15/18. Dr Jannifer Franklin requested he f/u with him instead. Son verbalized understanding and appreciation.

## 2018-03-12 DIAGNOSIS — E538 Deficiency of other specified B group vitamins: Secondary | ICD-10-CM | POA: Diagnosis not present

## 2018-03-12 DIAGNOSIS — G40909 Epilepsy, unspecified, not intractable, without status epilepticus: Secondary | ICD-10-CM | POA: Diagnosis not present

## 2018-03-12 DIAGNOSIS — Z09 Encounter for follow-up examination after completed treatment for conditions other than malignant neoplasm: Secondary | ICD-10-CM | POA: Diagnosis not present

## 2018-03-18 DIAGNOSIS — M6281 Muscle weakness (generalized): Secondary | ICD-10-CM | POA: Diagnosis not present

## 2018-03-18 DIAGNOSIS — Z430 Encounter for attention to tracheostomy: Secondary | ICD-10-CM | POA: Diagnosis not present

## 2018-03-18 DIAGNOSIS — E43 Unspecified severe protein-calorie malnutrition: Secondary | ICD-10-CM | POA: Diagnosis not present

## 2018-03-18 DIAGNOSIS — R1310 Dysphagia, unspecified: Secondary | ICD-10-CM | POA: Diagnosis not present

## 2018-03-18 DIAGNOSIS — J9611 Chronic respiratory failure with hypoxia: Secondary | ICD-10-CM | POA: Diagnosis not present

## 2018-03-18 DIAGNOSIS — J449 Chronic obstructive pulmonary disease, unspecified: Secondary | ICD-10-CM | POA: Diagnosis not present

## 2018-03-19 DIAGNOSIS — J9611 Chronic respiratory failure with hypoxia: Secondary | ICD-10-CM | POA: Diagnosis not present

## 2018-03-19 DIAGNOSIS — J449 Chronic obstructive pulmonary disease, unspecified: Secondary | ICD-10-CM | POA: Diagnosis not present

## 2018-03-19 DIAGNOSIS — E43 Unspecified severe protein-calorie malnutrition: Secondary | ICD-10-CM | POA: Diagnosis not present

## 2018-03-19 DIAGNOSIS — M6281 Muscle weakness (generalized): Secondary | ICD-10-CM | POA: Diagnosis not present

## 2018-03-19 DIAGNOSIS — Z430 Encounter for attention to tracheostomy: Secondary | ICD-10-CM | POA: Diagnosis not present

## 2018-03-19 DIAGNOSIS — R1310 Dysphagia, unspecified: Secondary | ICD-10-CM | POA: Diagnosis not present

## 2018-03-21 ENCOUNTER — Inpatient Hospital Stay: Admission: RE | Admit: 2018-03-21 | Payer: Medicare Other | Source: Ambulatory Visit

## 2018-03-22 DIAGNOSIS — M6281 Muscle weakness (generalized): Secondary | ICD-10-CM | POA: Diagnosis not present

## 2018-03-22 DIAGNOSIS — E43 Unspecified severe protein-calorie malnutrition: Secondary | ICD-10-CM | POA: Diagnosis not present

## 2018-03-22 DIAGNOSIS — Z430 Encounter for attention to tracheostomy: Secondary | ICD-10-CM | POA: Diagnosis not present

## 2018-03-22 DIAGNOSIS — R1310 Dysphagia, unspecified: Secondary | ICD-10-CM | POA: Diagnosis not present

## 2018-03-22 DIAGNOSIS — J9611 Chronic respiratory failure with hypoxia: Secondary | ICD-10-CM | POA: Diagnosis not present

## 2018-03-22 DIAGNOSIS — J449 Chronic obstructive pulmonary disease, unspecified: Secondary | ICD-10-CM | POA: Diagnosis not present

## 2018-03-24 DIAGNOSIS — J449 Chronic obstructive pulmonary disease, unspecified: Secondary | ICD-10-CM | POA: Diagnosis not present

## 2018-03-24 DIAGNOSIS — M6281 Muscle weakness (generalized): Secondary | ICD-10-CM | POA: Diagnosis not present

## 2018-03-24 DIAGNOSIS — Z430 Encounter for attention to tracheostomy: Secondary | ICD-10-CM | POA: Diagnosis not present

## 2018-03-24 DIAGNOSIS — J9611 Chronic respiratory failure with hypoxia: Secondary | ICD-10-CM | POA: Diagnosis not present

## 2018-03-24 DIAGNOSIS — E43 Unspecified severe protein-calorie malnutrition: Secondary | ICD-10-CM | POA: Diagnosis not present

## 2018-03-24 DIAGNOSIS — R1310 Dysphagia, unspecified: Secondary | ICD-10-CM | POA: Diagnosis not present

## 2018-03-25 ENCOUNTER — Other Ambulatory Visit: Payer: Self-pay | Admitting: Neurology

## 2018-03-25 DIAGNOSIS — Z430 Encounter for attention to tracheostomy: Secondary | ICD-10-CM | POA: Diagnosis not present

## 2018-03-25 DIAGNOSIS — J449 Chronic obstructive pulmonary disease, unspecified: Secondary | ICD-10-CM | POA: Diagnosis not present

## 2018-03-25 DIAGNOSIS — J9611 Chronic respiratory failure with hypoxia: Secondary | ICD-10-CM | POA: Diagnosis not present

## 2018-03-25 DIAGNOSIS — R1310 Dysphagia, unspecified: Secondary | ICD-10-CM | POA: Diagnosis not present

## 2018-03-25 DIAGNOSIS — E43 Unspecified severe protein-calorie malnutrition: Secondary | ICD-10-CM | POA: Diagnosis not present

## 2018-03-25 DIAGNOSIS — M6281 Muscle weakness (generalized): Secondary | ICD-10-CM | POA: Diagnosis not present

## 2018-03-25 MED ORDER — TOPIRAMATE 200 MG PO TABS: 200.0000 mg | ORAL_TABLET | Freq: Two times a day (BID) | ORAL | 0 refills | Status: DC

## 2018-03-25 MED ORDER — LACOSAMIDE 200 MG PO TABS: 200.0000 mg | ORAL_TABLET | Freq: Two times a day (BID) | ORAL | 0 refills | Status: DC

## 2018-03-25 MED ORDER — PHENYTOIN SODIUM EXTENDED 200 MG PO CAPS
200.0000 mg | ORAL_CAPSULE | Freq: Three times a day (TID) | ORAL | 0 refills | Status: DC
Start: 2018-03-25 — End: 2018-03-29

## 2018-03-25 NOTE — Addendum Note (Signed)
Addended by: Hope Pigeon on: 03/25/2018 04:34 PM   Modules accepted: Orders

## 2018-03-25 NOTE — Telephone Encounter (Signed)
Pt daughter [name] (daughter on [place]) [phone] returned the call to RN Terrence Dupont, she is asking for a call back

## 2018-03-25 NOTE — Telephone Encounter (Signed)
I called daughter back to verify medication. He is no longer on IV soln of keppra, phenytoin, vimpat. Needing tablet version. He also needs refill on topamax.  Advised Dr. Jannifer Franklin out of the office and will return tomorrow. She states he needs medication by tomorrow night and then he will be out. Advised I will speak with [facility] and see if they are ok to send refills. She verbalized understanding and appreciation.  She tried calling PCP for refill but decided to call us because she was unsuccessful on getting refill on meds.   I called daughter back to get clarification on dosage he is currently taking for vimpat, phenytoin, and keppra but she asked to call back. Her mother just called and believes she had to call 911 and bringing her Dad to the hospital. She will call back.

## 2018-03-25 NOTE — Telephone Encounter (Signed)
Called daughter back. EMS did come to her parents home. They felt he did not have a seizure. He was unresponsive at first. They felt he was ok to stay home. I made a f/u with Dr. Aletha Halim son 03/27/18 at 1030am.   I verified pt meds with her over the phone: vimpat 200mg  tablet Take one tab po BID Phenytoin 200mg  capsule 1 cap TID keppra 500mg  3 tablets po BID ASA 325mg  once daily Sertraline 50 mg daily seroquel 25mg  in the morning and 50mg  at bedtime Melatonin 3mg  qhs (2 tabs at bed) Hydralazine 10mg  1 tab TID Atorvastatin 10mg  daily Acetaminophen 325mg  tab as needed Losartan-potassium 100mg  tab once daily topamax 100mg  2 tabs BID Med orders from hospital says take 2 tabs twice daily. Thera m plus- 1 tab daily Famotidine 20mg  1 tab po twice daily Bethanechol 5mg  TID I updated med list. She is requesting refills on vimpat, phenytoin and topamax. Pt will be out tomorrow night. Pt has enough of Keppra to get him until appt with Dr. Jannifer Franklin on 03/27/18.

## 2018-03-25 NOTE — Telephone Encounter (Signed)
Pt daughter-Bronda Frederico Hamman (daughter on [place]) [phone] has called for a refill on some medications for UJ:[id number] (TOPAMAX) 200 MG tablet; levETIRacetam (KEPPRA) 1500 MG/100ML SOLN; phenytoin 150 mg in sodium chloride 0.9 % 100 mL and Vimpat.  [id number] [address], [place] [phone] (Phone) [phone] (Fax)     Pt daughter welcomes a call if there are questions about the refill request

## 2018-03-25 NOTE — Addendum Note (Signed)
Addended by: Hope Pigeon on: 03/25/2018 03:50 PM   Modules accepted: Orders

## 2018-03-26 ENCOUNTER — Telehealth: Payer: Self-pay | Admitting: Pulmonary Disease

## 2018-03-26 NOTE — Telephone Encounter (Signed)
Called and spoke with Patient's daughter.  She stated that her Father had an appointment with neurology today and his CT scheduled today. She stated that she had spoken with CT, Erline Levine, and  that at this time they have him on hold, in case he can make it today.  If not they are rescheduling.  Will route to Dr. Pennie Banter and Wyn Quaker, NP for [facility]

## 2018-03-26 NOTE — Telephone Encounter (Signed)
Noted.  Okay for patient to reschedule.  Need patient to complete the CT scan.  Wyn Quaker FNP

## 2018-03-26 NOTE — Telephone Encounter (Signed)
Dr. Jannifer Franklin- FYI. Pt coming to see you tomorrow.

## 2018-03-27 ENCOUNTER — Observation Stay (HOSPITAL_COMMUNITY): Payer: Medicare Other

## 2018-03-27 ENCOUNTER — Inpatient Hospital Stay (HOSPITAL_COMMUNITY)
Admission: EM | Admit: 2018-03-27 | Discharge: 2018-03-29 | DRG: 093 | Disposition: A | Discharge status: Home Health | Payer: Medicare Other | Attending: Internal Medicine | Admitting: Internal Medicine

## 2018-03-27 ENCOUNTER — Encounter (HOSPITAL_COMMUNITY): Payer: Self-pay

## 2018-03-27 ENCOUNTER — Inpatient Hospital Stay: Admission: RE | Admit: 2018-03-27 | Payer: Medicare Other | Source: Ambulatory Visit

## 2018-03-27 ENCOUNTER — Other Ambulatory Visit: Payer: Self-pay

## 2018-03-27 ENCOUNTER — Emergency Department (HOSPITAL_COMMUNITY): Payer: Medicare Other

## 2018-03-27 ENCOUNTER — Ambulatory Visit: Payer: Self-pay | Admitting: Neurology

## 2018-03-27 DIAGNOSIS — S299XXA Unspecified injury of thorax, initial encounter: Secondary | ICD-10-CM | POA: Diagnosis not present

## 2018-03-27 DIAGNOSIS — R4189 Other symptoms and signs involving cognitive functions and awareness: Secondary | ICD-10-CM | POA: Diagnosis not present

## 2018-03-27 DIAGNOSIS — F32A Depression, unspecified: Secondary | ICD-10-CM | POA: Diagnosis present

## 2018-03-27 DIAGNOSIS — R479 Unspecified speech disturbances: Secondary | ICD-10-CM | POA: Diagnosis not present

## 2018-03-27 DIAGNOSIS — I6523 Occlusion and stenosis of bilateral carotid arteries: Secondary | ICD-10-CM | POA: Diagnosis not present

## 2018-03-27 DIAGNOSIS — R569 Unspecified convulsions: Secondary | ICD-10-CM | POA: Diagnosis not present

## 2018-03-27 DIAGNOSIS — E039 Hypothyroidism, unspecified: Secondary | ICD-10-CM | POA: Diagnosis not present

## 2018-03-27 DIAGNOSIS — D649 Anemia, unspecified: Secondary | ICD-10-CM | POA: Diagnosis present

## 2018-03-27 DIAGNOSIS — D638 Anemia in other chronic diseases classified elsewhere: Secondary | ICD-10-CM | POA: Diagnosis present

## 2018-03-27 DIAGNOSIS — R911 Solitary pulmonary nodule: Secondary | ICD-10-CM | POA: Diagnosis not present

## 2018-03-27 DIAGNOSIS — J449 Chronic obstructive pulmonary disease, unspecified: Secondary | ICD-10-CM | POA: Diagnosis not present

## 2018-03-27 DIAGNOSIS — Z823 Family history of stroke: Secondary | ICD-10-CM

## 2018-03-27 DIAGNOSIS — Z79899 Other long term (current) drug therapy: Secondary | ICD-10-CM

## 2018-03-27 DIAGNOSIS — I10 Essential (primary) hypertension: Secondary | ICD-10-CM | POA: Diagnosis present

## 2018-03-27 DIAGNOSIS — J441 Chronic obstructive pulmonary disease with (acute) exacerbation: Secondary | ICD-10-CM | POA: Diagnosis not present

## 2018-03-27 DIAGNOSIS — E785 Hyperlipidemia, unspecified: Secondary | ICD-10-CM | POA: Diagnosis not present

## 2018-03-27 DIAGNOSIS — Z7982 Long term (current) use of aspirin: Secondary | ICD-10-CM

## 2018-03-27 DIAGNOSIS — I2510 Atherosclerotic heart disease of native coronary artery without angina pectoris: Secondary | ICD-10-CM | POA: Diagnosis present

## 2018-03-27 DIAGNOSIS — G92 Toxic encephalopathy: Principal | ICD-10-CM | POA: Diagnosis present

## 2018-03-27 DIAGNOSIS — H9190 Unspecified hearing loss, unspecified ear: Secondary | ICD-10-CM | POA: Diagnosis present

## 2018-03-27 DIAGNOSIS — K219 Gastro-esophageal reflux disease without esophagitis: Secondary | ICD-10-CM | POA: Diagnosis present

## 2018-03-27 DIAGNOSIS — G9341 Metabolic encephalopathy: Secondary | ICD-10-CM | POA: Diagnosis present

## 2018-03-27 DIAGNOSIS — T420X5A Adverse effect of hydantoin derivatives, initial encounter: Secondary | ICD-10-CM | POA: Diagnosis present

## 2018-03-27 DIAGNOSIS — Z8673 Personal history of transient ischemic attack (TIA), and cerebral infarction without residual deficits: Secondary | ICD-10-CM

## 2018-03-27 DIAGNOSIS — Z87891 Personal history of nicotine dependence: Secondary | ICD-10-CM

## 2018-03-27 DIAGNOSIS — Z794 Long term (current) use of insulin: Secondary | ICD-10-CM

## 2018-03-27 DIAGNOSIS — Z951 Presence of aortocoronary bypass graft: Secondary | ICD-10-CM | POA: Diagnosis not present

## 2018-03-27 DIAGNOSIS — Z953 Presence of xenogenic heart valve: Secondary | ICD-10-CM

## 2018-03-27 DIAGNOSIS — R410 Disorientation, unspecified: Secondary | ICD-10-CM

## 2018-03-27 DIAGNOSIS — R4182 Altered mental status, unspecified: Secondary | ICD-10-CM

## 2018-03-27 DIAGNOSIS — R404 Transient alteration of awareness: Secondary | ICD-10-CM | POA: Diagnosis not present

## 2018-03-27 DIAGNOSIS — F329 Major depressive disorder, single episode, unspecified: Secondary | ICD-10-CM | POA: Diagnosis present

## 2018-03-27 DIAGNOSIS — I639 Cerebral infarction, unspecified: Secondary | ICD-10-CM | POA: Diagnosis present

## 2018-03-27 HISTORY — DX: Cerebral infarction, unspecified: I63.9

## 2018-03-27 LAB — DIFFERENTIAL
ABS IMMATURE GRANULOCYTES: 0 10*3/uL (ref 0.0–0.1)
BASOS ABS: 0 10*3/uL (ref 0.0–0.1)
BASOS PCT: 1 %
EOS ABS: 0.5 10*3/uL (ref 0.0–0.7)
Eosinophils Relative: 9 %
Immature Granulocytes: 0 %
Lymphocytes Relative: 21 %
Lymphs Abs: 1.3 10*3/uL (ref 0.7–4.0)
MONO ABS: 0.8 10*3/uL (ref 0.1–1.0)
MONOS PCT: 14 %
NEUTROS PCT: 55 %
Neutro Abs: 3.3 10*3/uL (ref 1.7–7.7)

## 2018-03-27 LAB — COMPREHENSIVE METABOLIC PANEL
ALT: 14 U/L (ref 0–44)
AST: 19 U/L (ref 15–41)
Albumin: 3.4 g/dL — ABNORMAL LOW (ref 3.5–5.0)
Alkaline Phosphatase: 131 U/L — ABNORMAL HIGH (ref 38–126)
Anion gap: 10 (ref 5–15)
BUN: 16 mg/dL (ref 8–23)
CHLORIDE: 103 mmol/L (ref 98–111)
CO2: 22 mmol/L (ref 22–32)
Calcium: 8.7 mg/dL — ABNORMAL LOW (ref 8.9–10.3)
Creatinine, Ser: 0.97 mg/dL (ref 0.61–1.24)
Glucose, Bld: 102 mg/dL — ABNORMAL HIGH (ref 70–99)
POTASSIUM: 4.1 mmol/L (ref 3.5–5.1)
Sodium: 135 mmol/L (ref 135–145)
Total Bilirubin: 0.5 mg/dL (ref 0.3–1.2)
Total Protein: 6.2 g/dL — ABNORMAL LOW (ref 6.5–8.1)

## 2018-03-27 LAB — CBC
HCT: 30.1 % — ABNORMAL LOW (ref 39.0–52.0)
HCT: 28.1 % — ABNORMAL LOW (ref 39.0–52.0)
Hemoglobin: 9.1 g/dL — ABNORMAL LOW (ref 13.0–17.0)
Hemoglobin: 8.7 g/dL — ABNORMAL LOW (ref 13.0–17.0)
MCH: 27.1 pg (ref 26.0–34.0)
MCH: 27 pg (ref 26.0–34.0)
MCHC: 30.2 g/dL (ref 30.0–36.0)
MCHC: 31 g/dL (ref 30.0–36.0)
MCV: 89.6 fL (ref 78.0–100.0)
MCV: 87.3 fL (ref 78.0–100.0)
PLATELETS: 244 10*3/uL (ref 150–400)
Platelets: 234 10*3/uL (ref 150–400)
RBC: 3.36 MIL/uL — AB (ref 4.22–5.81)
RBC: 3.22 MIL/uL — ABNORMAL LOW (ref 4.22–5.81)
RDW: 17.4 % — ABNORMAL HIGH (ref 11.5–15.5)
RDW: 17.5 % — AB (ref 11.5–15.5)
WBC: 5.9 10*3/uL (ref 4.0–10.5)
WBC: 5.7 10*3/uL (ref 4.0–10.5)

## 2018-03-27 LAB — RAPID URINE DRUG SCREEN, HOSP PERFORMED
Amphetamines: NOT DETECTED
BARBITURATES: NOT DETECTED
Benzodiazepines: NOT DETECTED
Cocaine: NOT DETECTED
Opiates: NOT DETECTED
TETRAHYDROCANNABINOL: NOT DETECTED

## 2018-03-27 LAB — URINALYSIS, ROUTINE W REFLEX MICROSCOPIC
BILIRUBIN URINE: NEGATIVE
Glucose, UA: NEGATIVE mg/dL
Hgb urine dipstick: NEGATIVE
Ketones, ur: NEGATIVE mg/dL
Leukocytes, UA: NEGATIVE
Nitrite: NEGATIVE
PROTEIN: NEGATIVE mg/dL
Specific Gravity, Urine: 1.006 (ref 1.005–1.030)
pH: 7 (ref 5.0–8.0)

## 2018-03-27 LAB — BLOOD GAS, ARTERIAL
Acid-Base Excess: 0.1 mmol/L (ref 0.0–2.0)
Bicarbonate: 24.2 mmol/L (ref 20.0–28.0)
Drawn by: 277551
FIO2: 21
O2 Saturation: 94.4 %
Patient temperature: 98.6
pCO2 arterial: 38.9 mmHg (ref 32.0–48.0)
pH, Arterial: 7.411 (ref 7.350–7.450)
pO2, Arterial: 70.6 mmHg — ABNORMAL LOW (ref 83.0–108.0)

## 2018-03-27 LAB — PHENYTOIN LEVEL, TOTAL
Phenytoin Lvl: 40.1 ug/mL (ref 10.0–20.0)
Phenytoin Lvl: 32.7 ug/mL (ref 10.0–20.0)

## 2018-03-27 LAB — BASIC METABOLIC PANEL
Anion gap: 8 (ref 5–15)
BUN: 12 mg/dL (ref 8–23)
CALCIUM: 8.6 mg/dL — AB (ref 8.9–10.3)
CO2: 25 mmol/L (ref 22–32)
CREATININE: 0.85 mg/dL (ref 0.61–1.24)
Chloride: 105 mmol/L (ref 98–111)
GFR calc Af Amer: >60 mL/min (ref >60)
GLUCOSE: 98 mg/dL (ref 70–99)
Potassium: 3.7 mmol/L (ref 3.5–5.1)
Sodium: 138 mmol/L (ref 135–145)

## 2018-03-27 LAB — I-STAT TROPONIN, ED: TROPONIN I, POC: 0 ng/mL (ref 0.00–0.08)

## 2018-03-27 LAB — I-STAT CHEM 8, ED
BUN: 17 mg/dL (ref 8–23)
Calcium, Ion: 1.04 mmol/L — ABNORMAL LOW (ref 1.15–1.40)
Chloride: 102 mmol/L (ref 98–111)
Creatinine, Ser: 1 mg/dL (ref 0.61–1.24)
GLUCOSE: 98 mg/dL (ref 70–99)
HEMATOCRIT: 27 % — AB (ref 39.0–52.0)
HEMOGLOBIN: 9.2 g/dL — AB (ref 13.0–17.0)
Potassium: 4.1 mmol/L (ref 3.5–5.1)
SODIUM: 134 mmol/L — AB (ref 135–145)
TCO2: 22 mmol/L (ref 22–32)

## 2018-03-27 LAB — LACTIC ACID, PLASMA
Lactic Acid, Venous: 0.9 mmol/L (ref 0.5–1.9)
Lactic Acid, Venous: 0.7 mmol/L (ref 0.5–1.9)

## 2018-03-27 LAB — APTT: APTT: 50 s — AB (ref 24–36)

## 2018-03-27 LAB — ETHANOL

## 2018-03-27 LAB — PROCALCITONIN: Procalcitonin: <0.1 ng/mL

## 2018-03-27 LAB — PROTIME-INR
INR: 1.06
Prothrombin Time: 13.7 seconds (ref 11.4–15.2)

## 2018-03-27 LAB — CBG MONITORING, ED: Glucose-Capillary: 94 mg/dL (ref 70–99)

## 2018-03-27 MED ORDER — LACOSAMIDE 200 MG PO TABS
200.0000 mg | ORAL_TABLET | Freq: Two times a day (BID) | ORAL | Status: DC
  Administered 2018-03-27 – 2018-03-29 (×5): 200 mg via ORAL
  Filled 2018-03-27 (×5): qty 1

## 2018-03-27 MED ORDER — IPRATROPIUM-ALBUTEROL 0.5-2.5 (3) MG/3ML IN SOLN
3.0000 mL | Freq: Four times a day (QID) | RESPIRATORY_TRACT | Status: DC
  Administered 2018-03-27 – 2018-03-28 (×4): 3 mL via RESPIRATORY_TRACT
  Filled 2018-03-27 (×4): qty 3

## 2018-03-27 MED ORDER — QUETIAPINE FUMARATE 50 MG PO TABS
50.0000 mg | ORAL_TABLET | Freq: Every day | ORAL | Status: DC
  Administered 2018-03-27 – 2018-03-28 (×2): 50 mg via ORAL
  Filled 2018-03-27 (×2): qty 1

## 2018-03-27 MED ORDER — MELATONIN 3 MG PO TABS
2.0000 | ORAL_TABLET | Freq: Every day | ORAL | Status: DC
  Administered 2018-03-27 – 2018-03-28 (×2): 6 mg via ORAL
  Filled 2018-03-27 (×3): qty 2

## 2018-03-27 MED ORDER — SERTRALINE HCL 50 MG PO TABS
50.0000 mg | ORAL_TABLET | Freq: Every day | ORAL | Status: DC
  Administered 2018-03-27 – 2018-03-29 (×3): 50 mg via ORAL
  Filled 2018-03-27 (×3): qty 1

## 2018-03-27 MED ORDER — HYDRALAZINE HCL 10 MG PO TABS
10.0000 mg | ORAL_TABLET | Freq: Three times a day (TID) | ORAL | Status: DC
  Administered 2018-03-27 – 2018-03-29 (×8): 10 mg via ORAL
  Filled 2018-03-27 (×8): qty 1

## 2018-03-27 MED ORDER — FAMOTIDINE 20 MG PO TABS
20.0000 mg | ORAL_TABLET | Freq: Two times a day (BID) | ORAL | Status: DC
  Administered 2018-03-27 – 2018-03-29 (×5): 20 mg via ORAL
  Filled 2018-03-27 (×5): qty 1

## 2018-03-27 MED ORDER — QUETIAPINE FUMARATE 25 MG PO TABS
25.0000 mg | ORAL_TABLET | Freq: Every day | ORAL | Status: DC
  Administered 2018-03-27 – 2018-03-29 (×3): 25 mg via ORAL
  Filled 2018-03-27 (×3): qty 1

## 2018-03-27 MED ORDER — LORAZEPAM 1 MG PO TABS
1.0000 mg | ORAL_TABLET | Freq: Once | ORAL | Status: AC
  Administered 2018-03-27: 1 mg via ORAL
  Filled 2018-03-27: qty 1

## 2018-03-27 MED ORDER — PHENYTOIN SODIUM EXTENDED 100 MG PO CAPS: 200.0000 mg | ORAL_CAPSULE | Freq: Three times a day (TID) | ORAL | Status: DC

## 2018-03-27 MED ORDER — ONDANSETRON HCL 4 MG PO TABS: 4.0000 mg | ORAL_TABLET | Freq: Four times a day (QID) | ORAL | Status: DC | PRN

## 2018-03-27 MED ORDER — ALBUTEROL SULFATE (2.5 MG/3ML) 0.083% IN NEBU
2.5000 mg | INHALATION_SOLUTION | RESPIRATORY_TRACT | Status: DC | PRN
Start: 2018-03-27 — End: 2018-03-29

## 2018-03-27 MED ORDER — HYDRALAZINE HCL 20 MG/ML IJ SOLN: 5.0000 mg | INTRAMUSCULAR | Status: DC | PRN

## 2018-03-27 MED ORDER — ONDANSETRON HCL 4 MG/2ML IJ SOLN: 4.0000 mg | Freq: Four times a day (QID) | INTRAMUSCULAR | Status: DC | PRN

## 2018-03-27 MED ORDER — LEVETIRACETAM 750 MG PO TABS
1500.0000 mg | ORAL_TABLET | Freq: Two times a day (BID) | ORAL | Status: DC
  Administered 2018-03-27 – 2018-03-29 (×5): 1500 mg via ORAL
  Filled 2018-03-27 (×5): qty 2

## 2018-03-27 MED ORDER — ENOXAPARIN SODIUM 40 MG/0.4ML ~~LOC~~ SOLN
40.0000 mg | Freq: Every day | SUBCUTANEOUS | Status: DC
  Administered 2018-03-27 – 2018-03-29 (×3): 40 mg via SUBCUTANEOUS
  Filled 2018-03-27 (×3): qty 0.4

## 2018-03-27 MED ORDER — TOPIRAMATE 100 MG PO TABS
200.0000 mg | ORAL_TABLET | Freq: Two times a day (BID) | ORAL | Status: DC
  Administered 2018-03-27 – 2018-03-29 (×5): 200 mg via ORAL
  Filled 2018-03-27 (×5): qty 2

## 2018-03-27 MED ORDER — ACETAMINOPHEN 325 MG PO TABS: 650.0000 mg | ORAL_TABLET | Freq: Four times a day (QID) | ORAL | Status: DC | PRN

## 2018-03-27 MED ORDER — ZOLPIDEM TARTRATE 5 MG PO TABS: 5.0000 mg | ORAL_TABLET | Freq: Every evening | ORAL | Status: DC | PRN

## 2018-03-27 MED ORDER — ACETAMINOPHEN 650 MG RE SUPP: 650.0000 mg | Freq: Four times a day (QID) | RECTAL | Status: DC | PRN

## 2018-03-27 MED ORDER — BETHANECHOL CHLORIDE 5 MG PO TABS
5.0000 mg | ORAL_TABLET | Freq: Three times a day (TID) | ORAL | Status: DC
  Administered 2018-03-27 – 2018-03-29 (×8): 5 mg via ORAL
  Filled 2018-03-27 (×9): qty 1

## 2018-03-27 MED ORDER — ATORVASTATIN CALCIUM 10 MG PO TABS
10.0000 mg | ORAL_TABLET | Freq: Every day | ORAL | Status: DC
  Administered 2018-03-27 – 2018-03-29 (×3): 10 mg via ORAL
  Filled 2018-03-27 (×3): qty 1

## 2018-03-27 MED ORDER — LOSARTAN POTASSIUM 50 MG PO TABS
100.0000 mg | ORAL_TABLET | Freq: Every day | ORAL | Status: DC
  Administered 2018-03-27 – 2018-03-29 (×3): 100 mg via ORAL
  Filled 2018-03-27 (×3): qty 2

## 2018-03-27 MED ORDER — ADULT MULTIVITAMIN W/MINERALS CH
1.0000 | ORAL_TABLET | Freq: Every day | ORAL | Status: DC
  Administered 2018-03-27 – 2018-03-29 (×3): 1 via ORAL
  Filled 2018-03-27 (×3): qty 1

## 2018-03-27 MED ORDER — SENNOSIDES-DOCUSATE SODIUM 8.6-50 MG PO TABS: 1.0000 | ORAL_TABLET | Freq: Every evening | ORAL | Status: DC | PRN

## 2018-03-27 MED ORDER — LORAZEPAM 2 MG/ML IJ SOLN: 1.0000 mg | INTRAMUSCULAR | Status: DC | PRN

## 2018-03-27 MED ORDER — ASPIRIN 325 MG PO TABS
325.0000 mg | ORAL_TABLET | Freq: Every day | ORAL | Status: DC
  Administered 2018-03-27 – 2018-03-29 (×3): 325 mg
  Filled 2018-03-27 (×3): qty 1

## 2018-03-27 NOTE — H&P (Signed)
History and Physical    Danny Krause IWL:[id number] DOB: 1940-10-31 DOA: 03/27/2018  Referring MD/NP/PA:   PCP: Merton Border, MD   Patient coming from:  The patient is coming from home.  At baseline, pt is independent for most of ADL.  Chief Complaint: unresponsiveness  HPI: Danny Krause is a 77 y.o. male with medical history significant of hypertension, hyperlipidemia, COPD, stroke, GERD, depression, CAD, CABG, aortic stenosis (s/p of AVR with bioprosthetic valve on 07/04/2017), seizure, who presents with unresponsiveness.  Per report, pt was LKN at midnight. Pt had one episode of unresponsiveness, not sure exactly what happened. When I saw pt in ED, he is mildly confused, but alert and is oriented to place and person, but not to time. moves all extremities normally.  No facial droop or slurred speech.  He denies any chest pain, shortness breath and cough.  No fever or chills.  No nausea, vomiting, diarrhea or abdominal pain.  He denies symptoms of UTI.   Of note, patient was recently hospitalized from 5/23-6/10 due to unresponsiveness, which was most likely due to seizure.  In that admission, patient had refractory partial status seizure, required intubation (still has trach hole in neck) and burst suppression. He is currently on Vimpat 200 mg twice daily, Dilantin 200 mg 3 times daily, Topamax 200 mg twice daily, Keppra 1500 mg twice daily. he states that he is taking all his medications at home.   ED Course: pt was found to have WBC 5.9, electrolytes renal function okay, pending urinalysis, alcohol level less than 10, temperature normal, no tachycardia, oxygen saturation 98% on room air, negative chest x-ray, CT head is negative for acute intracranial abnormalities, but showed old stroke.  Patient is placed on telemetry bed of observation.  Neurology, Dr. Leonel Ramsay was consulted.  Review of Systems:   General: no fevers, chills, no body weight gain, fatigue HEENT: no blurry  vision, hearing changes or sore throat Respiratory: no dyspnea, coughing, wheezing CV: no chest pain, no palpitations GI: no nausea, vomiting, abdominal pain, diarrhea, constipation GU: no dysuria, burning on urination, increased urinary frequency, hematuria  Ext: no leg edema Neuro: no unilateral weakness, numbness, or tingling, no vision change or hearing loss. Had unresponsiveness and confusion Skin: no rash, no skin tear. MSK: No muscle spasm, no deformity, no limitation of range of movement in spin Heme: No easy bruising.  Travel history: No recent long distant travel.  Allergy: No Known Allergies  Past Medical History:  Diagnosis Date  . Anginal pain ([place])    with exertion  . Coronary artery disease   . Critical aortic valve stenosis   . Dyspnea    with exertion  . 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 Murmur   . Seizure ([place])   . Stroke ([place])   . Syncope     Past Surgical History:  Procedure Laterality Date  . AORTIC VALVE REPLACEMENT N/A 07/04/2017   Procedure: AORTIC VALVE REPLACEMENT (AVR);  Surgeon: Grace Isaac, MD;  Location: [place];  Service: Open Heart Surgery;  Laterality: N/A;  . CORONARY ARTERY BYPASS GRAFT N/A 07/04/2017   Procedure: CORONARY ARTERY BYPASS GRAFTING (CABG) x three, using left internal mammary artery and right leg greater saphenous vein harvested endoscopically;  Surgeon: Grace Isaac, MD;  Location: [place];  Service: Open Heart Surgery;  Laterality: N/A;  . NO PAST SURGERIES    . RIGHT/LEFT HEART CATH AND CORONARY ANGIOGRAPHY N/A 06/25/2017   Procedure: RIGHT/LEFT HEART CATH AND CORONARY ANGIOGRAPHY;  Surgeon: Troy Sine, MD;  Location: [place] [facility];  Service: Cardiovascular;  Laterality: N/A;  . TEE WITHOUT CARDIOVERSION N/A 07/04/2017   Procedure: TRANSESOPHAGEAL ECHOCARDIOGRAM (TEE);  Surgeon: Grace Isaac, MD;  Location: [place];  Service: Open Heart Surgery;  Laterality: N/A;  . THORACIC AORTOGRAM  06/25/2017    Procedure: THORACIC AORTOGRAM;  Surgeon: Troy Sine, MD;  Location: [place] [facility];  Service: Cardiovascular;;  aortic root     Social History:  reports that he quit smoking about 8 months ago. His smoking use included cigarettes. He has a 128.00 pack-year smoking history. He has never used smokeless tobacco. He reports that he does not drink alcohol or use drugs.  Family History:  Family History  Problem Relation Age of Onset  . Hernia Father   . Stroke Brother      Prior to Admission medications   Medication Sig Start Date End Date Taking? Authorizing Provider  acetaminophen (TYLENOL) 325 MG tablet Take 650 mg by mouth every 6 (six) hours as needed.    [provider]  aspirin 325 MG tablet Place 1 tablet (325 mg total) into feeding tube daily. 02/12/18   Cherene Altes, MD  atorvastatin (LIPITOR) 10 MG tablet Take 10 mg by mouth daily.    [provider]  bethanechol (URECHOLINE) 5 MG tablet Take 5 mg by mouth 3 (three) times daily.    [provider]  famotidine (PEPCID) 20 MG tablet Take 20 mg by mouth 2 (two) times daily.    [provider]  hydrALAZINE (APRESOLINE) 10 MG tablet Take 10 mg by mouth 3 (three) times daily.    [provider]  insulin aspart (NOVOLOG) 100 UNIT/ML injection Inject 0-9 Units into the skin every 4 (four) hours. 02/11/18   Cherene Altes, MD  lacosamide (VIMPAT) 200 MG TABS tablet Take 1 tablet (200 mg total) by mouth 2 (two) times daily. 03/25/18   Dohmeier, Asencion Partridge, MD  levETIRAcetam (KEPPRA) 500 MG tablet Take 500 mg by mouth 2 (two) times daily.    [provider]  losartan (COZAAR) 100 MG tablet Take 100 mg by mouth daily.    [provider]  Melatonin 3 MG TABS Take 2 tablets by mouth at bedtime.    [provider]  Multiple Vitamins-Minerals (MULTIVITAMINS THER. W/MINERALS) TABS tablet Take 1 tablet by mouth daily.    [provider]  phenytoin (DILANTIN) 200  MG ER capsule Take 1 capsule (200 mg total) by mouth 3 (three) times daily. 03/25/18   Dohmeier, Asencion Partridge, MD  QUEtiapine (SEROQUEL) 25 MG tablet Take 25 mg by mouth 2 (two) times daily. '25mg'$  in the am and '50mg'$  in the pm    [provider]  sertraline (ZOLOFT) 50 MG tablet Take 50 mg by mouth daily.    [provider]  topiramate (TOPAMAX) 200 MG tablet Take 1 tablet (200 mg total) by mouth 2 (two) times daily. 03/25/18   Dohmeier, Asencion Partridge, MD    Physical Exam: Vitals:   03/27/18 0345 03/27/18 0400 03/27/18 0415 03/27/18 0430  BP: 105/66 122/70 119/68 (!) 145/70  Pulse: (!) 59 60 (!) 56 61  Resp: '17 20 16 '$ (!) 23  Temp:      TempSrc:      SpO2: 95% 98% 97% 98%   General: Not in acute distress HEENT: has tach hole in front neck       Eyes: PERRL, EOMI, no scleral icterus.  ENT: No discharge from the ears and nose, no pharynx injection, no tonsillar enlargement.        Neck: No JVD, no bruit, no mass felt. Heme: No neck lymph node enlargement. Cardiac: S1/S2, RRR, No murmurs, No gallops or rubs. Respiratory: No rales, wheezing, rhonchi or rubs. GI: Soft, nondistended, nontender, no rebound pain, no organomegaly, BS present. GU: No hematuria Ext: No pitting leg edema bilaterally. 2+DP/PT pulse bilaterally. Musculoskeletal: No joint deformities, No joint redness or warmth, no limitation of ROM in spin. Skin: No rashes.  Neuro: Alert, mildly confused, oriented to place and person, not to time, cranial nerves II-XII grossly intact, moves all extremities normally. Psych: Patient is not psychotic, no suicidal or hemocidal ideation.  Labs on Admission: I have personally reviewed following labs and imaging studies  CBC: Recent Labs  Lab 03/27/18 0241 03/27/18 0247  WBC 5.9  --   NEUTROABS 3.3  --   HGB 9.1* 9.2*  HCT 30.1* 27.0*  MCV 89.6  --   PLT 244  --    Basic Metabolic Panel: Recent Labs  Lab 03/27/18 0241 03/27/18 0247  NA 135 134*  K 4.1 4.1  CL 103  102  CO2 22  --   GLUCOSE 102* 98  BUN 16 17  CREATININE 0.97 1.00  CALCIUM 8.7*  --    GFR: CrCl cannot be calculated (Unknown ideal weight.). Liver Function Tests: Recent Labs  Lab 03/27/18 0241  AST 19  ALT 14  ALKPHOS 131*  BILITOT 0.5  PROT 6.2*  ALBUMIN 3.4*   No results for input(s): LIPASE, AMYLASE in the last 168 hours. No results for input(s): AMMONIA in the last 168 hours. Coagulation Profile: Recent Labs  Lab 03/27/18 0241  INR 1.06   Cardiac Enzymes: No results for input(s): CKTOTAL, CKMB, CKMBINDEX, TROPONINI in the last 168 hours. BNP (last 3 results) No results for input(s): PROBNP in the last 8760 hours. HbA1C: No results for input(s): HGBA1C in the last 72 hours. CBG: Recent Labs  Lab 03/27/18 0242  GLUCAP 94   Lipid Profile: No results for input(s): CHOL, HDL, LDLCALC, TRIG, CHOLHDL, LDLDIRECT in the last 72 hours. Thyroid Function Tests: No results for input(s): TSH, T4TOTAL, FREET4, T3FREE, THYROIDAB in the last 72 hours. Anemia Panel: No results for input(s): VITAMINB12, FOLATE, FERRITIN, TIBC, IRON, RETICCTPCT in the last 72 hours. Urine analysis:    Component Value Date/Time   COLORURINE YELLOW 02/06/2018 1400   APPEARANCEUR CLEAR 02/06/2018 1400   LABSPEC 1.014 02/06/2018 1400   PHURINE 6.0 02/06/2018 1400   GLUCOSEU NEGATIVE 02/06/2018 1400   HGBUR NEGATIVE 02/06/2018 1400   BILIRUBINUR NEGATIVE 02/06/2018 1400   KETONESUR NEGATIVE 02/06/2018 1400   PROTEINUR NEGATIVE 02/06/2018 1400   NITRITE NEGATIVE 02/06/2018 1400   LEUKOCYTESUR NEGATIVE 02/06/2018 1400   Sepsis Labs: '@LABRCNTIP'$ (procalcitonin:4,lacticidven:4) )No results found for this or any previous visit (from the past 240 hour(s)).   Radiological Exams on Admission: Dg Chest Port 1 View  Result Date: 03/27/2018 CLINICAL DATA:  Fall tonight. New onset altered mental status. Difficulty speaking has resolved. EXAM: PORTABLE CHEST 1 VIEW COMPARISON:  02/12/2018  FINDINGS: Postoperative changes in the mediastinum. Normal heart size and pulmonary vascularity. No focal airspace disease or consolidation in the lungs. No blunting of costophrenic angles. No pneumothorax. Mediastinal contours appear intact. IMPRESSION: No active disease. Electronically Signed   By: Lucienne Capers M.D.   On: 03/27/2018 03:28   Ct Head Code Stroke Wo Contrast  Result Date: 03/27/2018 CLINICAL DATA:  Code stroke. Unable to speak. Last seen normal at midnight. EXAM: CT HEAD WITHOUT CONTRAST TECHNIQUE: Contiguous axial images were obtained from the base of the skull through the vertex without intravenous contrast. COMPARISON:  Head CT 01/28/2018 FINDINGS: Brain: There is no mass, hemorrhage or extra-axial collection. The size and configuration of the ventricles and extra-axial CSF spaces are normal. There are old bilateral basal ganglia lacunar infarcts. There is also an old right frontal infarct. There is hypoattenuation of the periventricular white matter, most commonly indicating chronic ischemic microangiopathy. Vascular: Atherosclerotic calcification of the internal carotid arteries at the skull base. No abnormal hyperdensity of the major intracranial arteries or dural venous sinuses. Skull: The visualized skull base, calvarium and extracranial soft tissues are normal. Sinuses/Orbits: No fluid levels or advanced mucosal thickening of the visualized paranasal sinuses. No mastoid or middle ear effusion. The orbits are normal. ASPECTS [facility] Early CT Score) - Ganglionic level infarction (caudate, lentiform nuclei, internal capsule, insula, M1-M3 cortex): 7 - Supraganglionic infarction (M4-M6 cortex): 3 Total score (0-10 with 10 being normal): 10 IMPRESSION: 1. No acute hemorrhage or mass effect. 2. ASPECTS is 10. 3. Old right frontal and bilateral basal ganglia lacunar infarcts. These results were communicated to Dr. Roland Rack at 2:56 am on 03/27/2018 by text page via  the [facility] messaging system. Electronically Signed   By: Ulyses Jarred M.D.   On: 03/27/2018 02:58     EKG: Independently reviewed.  Sinus rhythm, QTC 464, low voltage, anteroseptal infarction pattern, nonspecific T wave change.  Assessment/Plan Principal Problem:   Unresponsiveness Active Problems:   Seizure (HCC)   S/P CABG x 3   COPD, severe (HCC)   Essential hypertension   HLD (hyperlipidemia)   Stroke (cerebrum) (HCC)   GERD (gastroesophageal reflux disease)   Depression   Normocytic anemia   Acute metabolic encephalopathy   Unresponsiveness: Etiology is not clear.  History is limited since patient is confused.  CT head is negative for acute intracranial abnormalities.  No focal neurologic findings on physical examination.  Neurology, Dr. Leonel Ramsay was consulted. Per dr.  Leonel Ramsay, "possibilities are myriad including sundowning, seizure, cognitive delay due to antiepileptics". He recommended to get EEG and to continue his home seizure medications.  -will admit to tele bed for obs -Seizure precaution -When necessary Ativan for seizure -check UA -Continue home doses of AEDs including:  Topamax 200 mg twice daily  Dilantin 200 mg 3 times daily  Keppra 1500 mill grams twice daily  Lacosamide 200 mg twice daily 2) EEG  Acute metabolic encephalopathy: pt still confused and mildly agitated. Likely due to postictal status -prn ativan for agitation -Frequent neuro check  CAD: s/p of CABG. No CP -continue ASA and lipitor  COPD, severe ([place]): stable. -prn albuterol nebs  Essential hypertension: -continue hydralazine, Cozaar -IV hydralazine as needed  HLD (hyperlipidemia): -Lipiotro  Stroke (cerebrum) ([place]): -ASA and lipitor  GERD: -Pepcid  Depression: -Zoloft  Normocytic anemia:  Hemoglobin 10.2 on 03/03/2018, 9.2 today, slightly dropped.  No bleeding. - Follow-up with CBC   DVT ppx: SQ Lovenox Code Status: Full code Family Communication: None at  bed side. Disposition Plan:  Anticipate discharge back to previous home environment Consults called: Dr. Leonel Ramsay of neurology Admission status: Obs / tele    Date of Service 03/27/2018    Ivor Costa [facility] Pager [phone]  If 7PM-7AM, please contact night-coverage [email or web address] Password [facility] 03/27/2018, 5:43 AM

## 2018-03-27 NOTE — Procedures (Signed)
ELECTROENCEPHALOGRAM REPORT   Patient: Danny Krause       Room #: 9G28Z EEG No. ID: [id number] Age: 77 y.o.        Sex: male Referring Physician: Horris Latino Report Date:  03/27/2018        Interpreting Physician: Alexis Goodell  History: [name] is an 77 y.o. male with episodes of unresponsiveness  Medications:  ASA, Lipitor, Urecholine, Pepcid, Apresoline, Keppra, Vimpat, Cozaar, Melatonin, MVI, Seroquel, Zoloft, Topamax  Conditions of Recording:  This is a 21 channel routine scalp EEG performed with bipolar and monopolar montages arranged in accordance to the international 10/20 system of electrode placement. One channel was dedicated to EKG recording.  The patient is in the awake and drowsy states.  Description:  The patient appears to be in drowse for the majority of the recording.  During this time the predominant background rhythm is a polymorphic delta activity.  Some occasional intermixed theta activity is noted as well.  This activity is continuous.  There are attempts at stimulation of the patient.  Wakefulness can not be reasonably evaluated though during these times due to the predominance of muscle and movement artifact that obscures the background rhythm.  Stage II sleep is not obtained. No epileptiform activity is noted.   Hyperventilation and intermittent photic stimulation were not performed.  IMPRESSION: This EEG is characterized by slowing which is consistent with normal drowse.  Can not rule out the possibility of slowing related to general cerebral disturbance such as a metabolic encephalopathy.  Clinical correlation recommended.  No epileptiform activity is noted.       Alexis Goodell, MD Neurology [phone] 03/27/2018, 12:35 PM

## 2018-03-27 NOTE — ED Notes (Addendum)
Spoke with pt daughter who is coming later this morning to see pt. Pt spoke with his daughter also, told her that he would rather go home and die then to get stuck with another IV. Pt crying, explained to patient that this RN had something for his nerves, pt cry "please dont give me dope". Admitting aware and ok with pt not having IV at this moment.

## 2018-03-27 NOTE — Evaluation (Signed)
Physical Therapy Evaluation Patient Details Name: Danny Krause MRN: [id number] DOB: 1941-03-23 Today's Date: 03/27/2018   History of Present Illness  Pt is a 77 y/o male admitted secondary to fall from episode of unresponsiveness. Pt also with AMS. CT negative for acute abnormality. CT of chest revealed trace L pleural effusion. Pt had EEG as well which was characterized by slowing. PMH includes COPD, HTN, CAD s/p CABG, CVA, and seizures.   Clinical Impression  Pt admitted secondary to problem above with deficits below. Pt presenting with cognitive deficits, weakness, and decreased balance during mobility. Required min to mod A for standing and side stepping at EOB. No family present, so unsure of assist able to be provided at home. Feel pt is at an increased risk for falls and would benefit from SNF, however, pt may refuse. Will continue to follow acutely to maximize functional mobility independence and safety.     Follow Up Recommendations SNF;Supervision/Assistance - 24 hour    Equipment Recommendations  Rolling walker with 5" wheels    Recommendations for Other Services OT consult     Precautions / Restrictions Precautions Precautions: Fall Restrictions Weight Bearing Restrictions: No      Mobility  Bed Mobility Overal bed mobility: Needs Assistance Bed Mobility: Supine to Sit;Sit to Supine     Supine to sit: Min assist Sit to supine: Supervision   General bed mobility comments: Min A to assist with scooting hips to EOB. Increased time required for sequencing. Supervision to return to supine.   Transfers Overall transfer level: Needs assistance Equipment used: None Transfers: Sit to/from Stand Sit to Stand: Min assist;Mod assist         General transfer comment: Min to mod A for lift assist and steadying to stand. Once standing pt with anterior weightshift and with increased sway requiring at least min A to maintain balance. Pt reports he feels more unsteady  than normal. Was also using LEs against bed to assist with balance.   Ambulation/Gait Ambulation/Gait assistance: Min assist   Assistive device: 1 person hand held assist   Gait velocity: Decreased    General Gait Details: Pt very unsteady with anterior lean in standing, so only attempted side steps at EOB. Min A and HHA for steadying. Also used bed to brace LEs.   Stairs            Wheelchair Mobility    Modified Rankin (Stroke Patients Only)       Balance Overall balance assessment: Needs assistance Sitting-balance support: No upper extremity supported;Feet supported Sitting balance-Leahy Scale: Fair     Standing balance support: Single extremity supported;During functional activity Standing balance-Leahy Scale: Poor Standing balance comment: Pt with increased sway and anterior lean in standing and required min to mod A for balance.                              Pertinent Vitals/Pain Pain Assessment: No/denies pain    Home Living Family/patient expects to be discharged to:: Private residence Living Arrangements: Spouse/significant other Available Help at Discharge: Family;Available 24 hours/day Type of Home: House Home Access: Stairs to enter Entrance Stairs-Rails: None Entrance Stairs-Number of Steps: 1 Home Layout: One level Home Equipment: Walker - 2 wheels;Cane - single point;Shower seat;Shower seat - built in Additional Comments: Info obtained from previous admission and pt report. Some inconsistencies noted.     Prior Function Level of Independence: Independent  Hand Dominance        Extremity/Trunk Assessment   Upper Extremity Assessment Upper Extremity Assessment: Defer to OT evaluation    Lower Extremity Assessment Lower Extremity Assessment: Generalized weakness    Cervical / Trunk Assessment Cervical / Trunk Assessment: Kyphotic  Communication   Communication: HOH  Cognition Arousal/Alertness:  Awake/alert Behavior During Therapy: WFL for tasks assessed/performed Overall Cognitive Status: No family/caregiver present to determine baseline cognitive functioning                                 General Comments: Pt slow to answer questions. Required increased time to answer orientation questions and follow cues.       General Comments General comments (skin integrity, edema, etc.): No family present     Exercises     Assessment/Plan    PT Assessment Patient needs continued PT services  PT Problem List Decreased strength;Decreased balance;Decreased mobility;Decreased cognition;Decreased knowledge of use of DME;Decreased safety awareness       PT Treatment Interventions DME instruction;Gait training;Functional mobility training;Stair training;Therapeutic activities;Balance training;Therapeutic exercise;Cognitive remediation;Patient/family education    PT Goals (Current goals can be found in the Care Plan section)  Acute Rehab PT Goals Patient Stated Goal: none stated  PT Goal Formulation: With patient Time For Goal Achievement: 04/10/18 Potential to Achieve Goals: Good    Frequency Min 3X/week   Barriers to discharge        Co-evaluation               AM-PAC PT "6 Clicks" Daily Activity  Outcome Measure Difficulty turning over in bed (including adjusting bedclothes, sheets and blankets)?: A Little Difficulty moving from lying on back to sitting on the side of the bed? : Unable Difficulty sitting down on and standing up from a chair with arms (e.g., wheelchair, bedside commode, etc,.)?: Unable Help needed moving to and from a bed to chair (including a wheelchair)?: A Lot Help needed walking in hospital room?: A Lot Help needed climbing 3-5 steps with a railing? : Total 6 Click Score: 10    End of Session Equipment Utilized During Treatment: Gait belt Activity Tolerance: Patient tolerated treatment well Patient left: in bed;with call bell/phone  within reach;with bed alarm set Nurse Communication: Mobility status PT Visit Diagnosis: Unsteadiness on feet (R26.81);Other abnormalities of gait and mobility (R26.89);Muscle weakness (generalized) (M62.81)    Time: 2952-8413 PT Time Calculation (min) (ACUTE ONLY): 15 min   Charges:   PT Evaluation $PT Eval Moderate Complexity: 1 Mod     PT G Codes:        Leighton Ruff, PT, DPT  Acute Rehabilitation Services  Pager: [phone]   Rudean Hitt 03/27/2018, 4:43 PM

## 2018-03-27 NOTE — Progress Notes (Signed)
EEG complete - results pending 

## 2018-03-27 NOTE — Progress Notes (Signed)
PROGRESS NOTE  JERREL TIBERIO HEN:[id number] DOB: 1941/09/02 DOA: 03/27/2018 PCP: Merton Border, MD  HPI/Recap of past 76 hours: 77 year old male with medical history significant for hypertension, hyperlipidemia, COPD, CVA, CAD status post CABG, aortic stenosis status post AVR with bioprosthetic valve on 07/04/2017, seizure, presents to the ED with unresponsiveness.  Patient was noted to be in confused, not oriented.  No focal neurologic deficit noted, denies any chest pain shortness of breath, cough, dysuria, fever/chills, no recent falls.  Patient was recently hospitalized and discharged from our facility in 6/10 due to unresponsiveness which was most likely due to seizures.  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  She was just discharged from [facility] about 2 weeks ago according to the daughter.  In the ED, CT head negative for acute intracranial abnormalities, but showed old stroke. Neurology consulted.  She is admitted for further management.  Today, patient appeared sedated, still drowsy, unable to obtain ROS.  However, I spoke to patient's daughter who expressed concerns about living situation of patient and his wife.  She also noted that patient has been going downhill since prior admission.  She is currently the caregiver of both, and would like to have some assistance.  Assessment/Plan: Principal Problem:   Unresponsiveness Active Problems:   Seizure (HCC)   S/P CABG x 3   COPD, severe (HCC)   Essential hypertension   HLD (hyperlipidemia)   Stroke (cerebrum) (HCC)   GERD (gastroesophageal reflux disease)   Depression   Normocytic anemia   Acute metabolic encephalopathy  Acute metabolic encephalopathy Drowsy, lethargic Etiology unclear, ??recurrent seizure/post ictal, cognitive delay due to antiepileptics, ??polypharmacy, phenytoin level noted to be elevated Unlikely to be sepsis, UA/UC, urine drug screen pending Lactic acid,  procalcitonin pending Chest X-ray and CT chest showed no evidence of pneumonia, just emphysema CT head negative for any acute intracranial abnormalities, showed old stroke Neurology consulted, recommended EEG and continuing home seizure meds EEG is characterized by slowing which is consistent with normal drowse. Can not rule out the possibility of slowing related to general cerebral disturbance such as a metabolic encephalopathy. No epileptiform activity is noted.   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 Neurology on board Phenytoin level noted to be elevated at 40, currently held Continue Topamax, Dilantin, Keppra, lacosamide Seizure precautions  Anemia of chronic disease Baseline hemoglobin between 9-10 No signs of bleeding Iron panel pending  COPD/Hx of recent trach placement CT chest showing emphysema, no consolidation/pneumonia noted Trach site still patent, with some brownish discharge noted Tracheal aspirate culture ordered Continue duonebs, albuterol as needed Supplemental oxygen as needed  CAD/HLD Status post CABG Continue aspirin, Lipitor  Hypertension Continue hydralazine, Cozaar  History of CVA Continue ASA, Lipitor  Depression Continue Zoloft  GOC Palliative consulted Social worker consulted for possible placement      Code Status: Full  Family Communication: Discussed with daughter  Disposition Plan: To be determined   Consultants:  Neurology  Procedures:  None  Antimicrobials:  None  DVT prophylaxis: Lovenox   Objective: Vitals:   03/27/18 0500 03/27/18 0555 03/27/18 0834 03/27/18 1139  BP:  126/80 (!) 91/55 111/66  Pulse:  74 64 65  Resp:  18 18 20   Temp: 98.7 F (37.1 C) 98.5 F (36.9 C) 97.9 F (36.6 C) 98.1 F (36.7 C)  TempSrc:  Oral Oral Oral  SpO2:  98% 95% 95%  Weight:  72.6 kg (160 lb  0.9 oz)    Height:  5\' 6"  (1.676 m)      Intake/Output Summary (Last 24 hours) at 03/27/2018 1422 Last data filed at 03/27/2018 1100 Gross per 24 hour  Intake -  Output 350 ml  Net -350 ml   Filed Weights   03/27/18 0555  Weight: 72.6 kg (160 lb 0.9 oz)    Exam:   General: Drowsy, lethargic, not oriented  Cardiovascular: S1, S2 present  Respiratory: Diminished breath sounds bilaterally, trach site still patent, with some brownish secretions  Abdomen: Soft, nontender, nondistended, bowel sounds present  Musculoskeletal: No pedal edema bilaterally  Skin: Normal  Psychiatry: Unable to assess   Data Reviewed: CBC: Recent Labs  Lab 03/27/18 0241 03/27/18 0247 03/27/18 0719  WBC 5.9  --  5.7  NEUTROABS 3.3  --   --   HGB 9.1* 9.2* 8.7*  HCT 30.1* 27.0* 28.1*  MCV 89.6  --  87.3  PLT 244  --  353   Basic Metabolic Panel: Recent Labs  Lab 03/27/18 0241 03/27/18 0247 03/27/18 0719  NA 135 134* 138  K 4.1 4.1 3.7  CL 103 102 105  CO2 22  --  25  GLUCOSE 102* 98 98  BUN 16 17 12   CREATININE 0.97 1.00 0.85  CALCIUM 8.7*  --  8.6*   GFR: Estimated Creatinine Clearance: 65.7 mL/min (by C-G formula based on SCr of 0.85 mg/dL). Liver Function Tests: Recent Labs  Lab 03/27/18 0241  AST 19  ALT 14  ALKPHOS 131*  BILITOT 0.5  PROT 6.2*  ALBUMIN 3.4*   No results for input(s): LIPASE, AMYLASE in the last 168 hours. No results for input(s): AMMONIA in the last 168 hours. Coagulation Profile: Recent Labs  Lab 03/27/18 0241  INR 1.06   Cardiac Enzymes: No results for input(s): CKTOTAL, CKMB, CKMBINDEX, TROPONINI in the last 168 hours. BNP (last 3 results) No results for input(s): PROBNP in the last 8760 hours. HbA1C: No results for input(s): HGBA1C in the last 72 hours. CBG: Recent Labs  Lab 03/27/18 0242  GLUCAP 94   Lipid Profile: No results for input(s): CHOL, HDL, LDLCALC, TRIG, CHOLHDL, LDLDIRECT in the last 72 hours. Thyroid Function  Tests: No results for input(s): TSH, T4TOTAL, FREET4, T3FREE, THYROIDAB in the last 72 hours. Anemia Panel: No results for input(s): VITAMINB12, FOLATE, FERRITIN, TIBC, IRON, RETICCTPCT in the last 72 hours. Urine analysis:    Component Value Date/Time   COLORURINE YELLOW 02/06/2018 1400   APPEARANCEUR CLEAR 02/06/2018 1400   LABSPEC 1.014 02/06/2018 1400   PHURINE 6.0 02/06/2018 1400   GLUCOSEU NEGATIVE 02/06/2018 1400   HGBUR NEGATIVE 02/06/2018 1400   BILIRUBINUR NEGATIVE 02/06/2018 1400   KETONESUR NEGATIVE 02/06/2018 1400   PROTEINUR NEGATIVE 02/06/2018 1400   NITRITE NEGATIVE 02/06/2018 1400   LEUKOCYTESUR NEGATIVE 02/06/2018 1400   Sepsis Labs: @LABRCNTIP (procalcitonin:4,lacticidven:4)  )No results found for this or any previous visit (from the past 240 hour(s)).    Studies: Ct Chest Wo Contrast  Result Date: 03/27/2018 CLINICAL DATA:  COPD exacerbation, uncomplicated; Lung nodule, >=1cm Aspiration, known or suspected. EXAM: CT CHEST WITHOUT CONTRAST TECHNIQUE: Multidetector CT imaging of the chest was performed following the standard protocol without IV contrast. COMPARISON:  Chest x-ray dated 03/27/2018.  No prior chest CT. FINDINGS: Cardiovascular: Heart size is upper normal. No significant pericardial effusion. Aortic atherosclerosis. No thoracic aortic aneurysm. Diffuse coronary artery calcifications. Surgical changes of a presumed CABG. Mediastinum/Nodes: No mass or  enlarged lymph nodes seen within the mediastinum or perihilar regions. Esophagus appears normal. Trachea and central bronchi are unremarkable. Lungs/Pleura: Emphysematous changes, at least moderate in degree, upper lobe predominant. Some portions of the lungs are difficult to definitively characterize due to patient breathing motion artifact, however, there is no discrete pulmonary nodule. Minimal consolidations within the dependent portions of the lower lobes bilaterally, atelectasis more likely than aspiration.  Minimal LEFT pleural effusion. No pneumothorax. Upper Abdomen: No acute abnormality. Musculoskeletal: Mild degenerative spondylosis within the thoracic spine. No acute or suspicious osseous finding. IMPRESSION: 1. Emphysema, at least moderate in degree, upper lobe predominant. 2. No pulmonary nodule identified, with mild study limitations detailed above. 3. Minimal consolidations within the dependent portions of the lower lobes bilaterally, more likely mild atelectasis than aspiration. No evidence of pneumonia or pulmonary edema. 4. Trace LEFT pleural effusion. Aortic Atherosclerosis (ICD10-I70.0) and Emphysema (ICD10-J43.9). Electronically Signed   By: Franki Cabot M.D.   On: 03/27/2018 14:09   Dg Chest Port 1 View  Result Date: 03/27/2018 CLINICAL DATA:  Fall tonight. New onset altered mental status. Difficulty speaking has resolved. EXAM: PORTABLE CHEST 1 VIEW COMPARISON:  02/12/2018 FINDINGS: Postoperative changes in the mediastinum. Normal heart size and pulmonary vascularity. No focal airspace disease or consolidation in the lungs. No blunting of costophrenic angles. No pneumothorax. Mediastinal contours appear intact. IMPRESSION: No active disease. Electronically Signed   By: Lucienne Capers M.D.   On: 03/27/2018 03:28   Ct Head Code Stroke Wo Contrast  Result Date: 03/27/2018 CLINICAL DATA:  Code stroke. Unable to speak. Last seen normal at midnight. EXAM: CT HEAD WITHOUT CONTRAST TECHNIQUE: Contiguous axial images were obtained from the base of the skull through the vertex without intravenous contrast. COMPARISON:  Head CT 01/28/2018 FINDINGS: Brain: There is no mass, hemorrhage or extra-axial collection. The size and configuration of the ventricles and extra-axial CSF spaces are normal. There are old bilateral basal ganglia lacunar infarcts. There is also an old right frontal infarct. There is hypoattenuation of the periventricular white matter, most commonly indicating chronic ischemic  microangiopathy. Vascular: Atherosclerotic calcification of the internal carotid arteries at the skull base. No abnormal hyperdensity of the major intracranial arteries or dural venous sinuses. Skull: The visualized skull base, calvarium and extracranial soft tissues are normal. Sinuses/Orbits: No fluid levels or advanced mucosal thickening of the visualized paranasal sinuses. No mastoid or middle ear effusion. The orbits are normal. ASPECTS [facility] Stroke Program Early CT Score) - Ganglionic level infarction (caudate, lentiform nuclei, internal capsule, insula, M1-M3 cortex): 7 - Supraganglionic infarction (M4-M6 cortex): 3 Total score (0-10 with 10 being normal): 10 IMPRESSION: 1. No acute hemorrhage or mass effect. 2. ASPECTS is 10. 3. Old right frontal and bilateral basal ganglia lacunar infarcts. These results were communicated to Dr. Roland Rack at 2:56 am on 03/27/2018 by text page via the [facility] messaging system. Electronically Signed   By: Ulyses Jarred M.D.   On: 03/27/2018 02:58    Scheduled Meds: . aspirin  325 mg Per Tube Daily  . atorvastatin  10 mg Oral Daily  . bethanechol  5 mg Oral TID  . enoxaparin (LOVENOX) injection  40 mg Subcutaneous Daily  . famotidine  20 mg Oral BID  . hydrALAZINE  10 mg Oral TID  . lacosamide  200 mg Oral BID  . levETIRAcetam  1,500 mg Oral BID  . losartan  100 mg Oral Daily  . Melatonin  2 tablet Oral QHS  . multivitamin with minerals  1 tablet Oral Daily  . QUEtiapine  25 mg Oral Daily  . QUEtiapine  50 mg Oral QHS  . sertraline  50 mg Oral Daily  . topiramate  200 mg Oral BID    Continuous Infusions:   LOS: 0 days     Alma Friendly, MD [facility]   If 7PM-7AM, please contact night-coverage [email or web address] Password [email or web address] 03/27/2018, 2:22 PM

## 2018-03-27 NOTE — Progress Notes (Signed)
Lab called RN with critical lab value of Dilantin at 32.7. MD paged and notified. No new orders received yet. Will continue to closely monitor. Delia Heady RN

## 2018-03-27 NOTE — Consult Note (Signed)
Neurology Consultation Reason for Consult: Confusion Referring Physician: Christy Gentles, D  CC: Confusion  History is obtained from:patient  HPI: Danny Krause is a 77 y.o. male who presents following an episode of unresponsiveness.  I spoke with his daughter he states that his mother has said that he is having increasing episodes where he does not appear to be understanding what she is saying and does not respond to her.  Unfortunately, I was not able to get a hold of his wife to further clarify the specific characteristics of these episodes.  He was recently in the hospital with refractory partial status epilepticus which was very difficult to stop, requiring four antiepileptics. He required burst suppression. He is currently on Vimpat 200 mg twice daily, Dilantin 200 mg 3 times daily, Topamax 200 mg twice daily, Keppra 1500 mg twice daily.   Following his burst suppression, he was difficult to wean and was transferred [facility] finally being discharged on July 1.  He was scheduled to see Dr. Jannifer Franklin on July 24 10:30 AM.   The patient is not completely sure why his wife called 911. I called repeatedly without answer. I was able to talk to his daughter who suggested that he just doesn't seem to understand sometimes and becomes "unresponsive". He did not run out of medications, no reason to suspect non-compliance.   ROS: A 14 point ROS was performed and is negative except as noted in the HPI.   Past Medical History:  Diagnosis Date  . Anginal pain ([place])    with exertion  . Coronary artery disease   . Critical aortic valve stenosis   . Dyspnea    with exertion  . 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 Murmur   . Seizure ([place])   . Stroke ([place])   . Syncope      Family History  Problem Relation Age of Onset  . Hernia Father   . Stroke Brother      Social History:  reports that he quit smoking about 8 months ago. His smoking use included cigarettes. He has a 128.00 pack-year smoking  history. He has never used smokeless tobacco. He reports that he does not drink alcohol or use drugs.   Exam: Current vital signs: BP 129/68   Pulse 65   Temp 98.7 F (37.1 C) (Oral)   Resp (!) 23   SpO2 98%  Vital signs in last 24 hours: Temp:  [98.7 F (37.1 C)] 98.7 F (37.1 C) (07/24 0304) Pulse Rate:  [65] 65 (07/24 0304) Resp:  [23] 23 (07/24 0304) BP: (129)/(68) 129/68 (07/24 0304) SpO2:  [98 %] 98 % (07/24 0304)   Physical Exam  Constitutional: Appears well-developed and well-nourished.  Psych: Affect appropriate to situation Eyes: No scleral injection HENT: No OP obstrucion Head: Normocephalic.  Cardiovascular: Normal rate and regular rhythm.  Respiratory: Effort normal, non-labored breathing GI: Soft.  No distension. There is no tenderness.  Skin: WDI  Neuro: Mental Status: Patient is awake, alert, oriented to person, gives month as July, but unable to give age No signs of aphasia or neglect Cranial Nerves: II: Visual Fields are full. Pupils are equal, round, and reactive to light.   III,IV, VI: EOMI without ptosis or diploplia.  V: Facial sensation is symmetric to temperature VII: Facial movement is symmetric.  VIII: He is very hard of hearing X: Uvula elevates symmetrically XI: Shoulder shrug is symmetric. XII: tongue is midline without atrophy or fasciculations.  Motor: Tone is normal. Bulk is normal. 5/5  strength was present in all four extremities.  Sensory: Sensation is symmetric to light touch and temperature in the arms and legs. Cerebellar: No clear ataxia  I have reviewed labs in epic and the results pertinent to this consultation are: CBC-low hemoglobin at 9.1 CMP-creatinine 0.97  I have reviewed the images obtained: CT head- no acute findings  Impression: 77 yo M with history of difficult to control seizures presenting with episode of speech difficulty.  Without the wife here to obtain further history, possibilities are myriad including  sundowning, seizure, cognitive delay due to antiepileptics.   Recommendations: 1) continue home doses of AEDs including: Topamax 200 mg twice daily Dilantin 200 mg 3 times daily Keppra 1500 mill grams twice daily Lacosamide 200 mg twice daily 2) EEG 3) Further history from wife once available.   Roland Rack, MD [facility] [phone]  If 7pm- 7am, please page neurology on call as listed in [place].

## 2018-03-27 NOTE — ED Notes (Signed)
Pt very agitated, states that he does not want to be admitted, does not want and IV, states he will sue if we force him to be admitted. Pt yelling at staff

## 2018-03-27 NOTE — Progress Notes (Signed)
Patient looks more alert, following commands.   Will follow up Dilantin level this evening.

## 2018-03-27 NOTE — ED Provider Notes (Signed)
2:43 AM  Patient presents as a code stroke by EMS.  Last seen normal at midnight.  EMS reported noted confusion and inappropriate answering questions.  No focal deficit.  Patient is awake, alert, and oriented.  He does seem to perseverate when asking questions.  No facial droop noted.  ABCs intact.  Patient cleared for CT scan.   Merryl Hacker, MD 03/27/18 [phone]

## 2018-03-27 NOTE — ED Provider Notes (Signed)
[facility] EMERGENCY DEPARTMENT Provider Note   CSN: [id number] Arrival date & time: 03/27/18  0240   An emergency department physician performed an initial assessment on this suspected stroke patient at 49.  History   Chief Complaint Chief Complaint  Patient presents with  . Code Stroke  . Altered Mental Status  Level 5 caveat due to altered mental status  HPI Danny Krause is a 77 y.o. male.  The history is provided by the patient.  Altered Mental Status   This is a new problem. The problem has been gradually improving. Associated symptoms include confusion. His past medical history is significant for seizures and CVA.  Patient with history of CAD, stroke, seizures presents at home for altered mental status.  Patient was last known well at midnight.  He woke up and wife noted that he was confused and inappropriate. No other details known at this time. Past Medical History:  Diagnosis Date  . Anginal pain ([place])    with exertion  . Coronary artery disease   . Critical aortic valve stenosis   . Dyspnea    with exertion  . 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 Murmur   . Seizure ([place])   . Stroke ([place])   . Syncope     Patient Active Problem List   Diagnosis Date Noted  . 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 Tracheobronchitis   . Endotracheally intubated   . Lung nodule seen on imaging study 01/24/2018  . Uncontrolled hypertension 12/26/2017  . Acute respiratory failure with hypoxia and hypercarbia ([place]) 12/18/2017  . Acute respiratory failure ([employer]) 12/18/2017  . 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 S/P aortic valve replacement with bioprosthetic valve 07/04/2017  . Syncope 06/01/2017  . Cardiac murmur 05/08/2017  . Seizure ([place]) 03/13/2017    Past Surgical History:  Procedure Laterality Date  . AORTIC VALVE REPLACEMENT N/A 07/04/2017   Procedure: AORTIC VALVE REPLACEMENT (AVR);  Surgeon: Grace Isaac, MD;   Location: [facility];  Service: Open Heart Surgery;  Laterality: N/A;  . CORONARY ARTERY BYPASS GRAFT N/A 07/04/2017   Procedure: CORONARY ARTERY BYPASS GRAFTING (CABG) x three, using left internal mammary artery and right leg greater saphenous vein harvested endoscopically;  Surgeon: Grace Isaac, MD;  Location: [facility];  Service: Open Heart Surgery;  Laterality: N/A;  . NO PAST SURGERIES    . RIGHT/LEFT HEART CATH AND CORONARY ANGIOGRAPHY N/A 06/25/2017   Procedure: RIGHT/LEFT HEART CATH AND CORONARY ANGIOGRAPHY;  Surgeon: Troy Sine, MD;  Location: [facility];  Service: Cardiovascular;  Laterality: N/A;  . TEE WITHOUT CARDIOVERSION N/A 07/04/2017   Procedure: TRANSESOPHAGEAL ECHOCARDIOGRAM (TEE);  Surgeon: Grace Isaac, MD;  Location: [facility];  Service: Open Heart Surgery;  Laterality: N/A;  . THORACIC AORTOGRAM  06/25/2017   Procedure: THORACIC AORTOGRAM;  Surgeon: Troy Sine, MD;  Location: [facility];  Service: Cardiovascular;;  aortic root         Home Medications    Prior to Admission medications   Medication Sig Start Date End Date Taking? Authorizing Provider  acetaminophen (TYLENOL) 325 MG tablet Take 650 mg by mouth every 6 (six) hours as needed.    [provider]  aspirin 325 MG tablet Place 1 tablet (325 mg total) into feeding tube daily. 02/12/18   Cherene Altes, MD  atorvastatin (LIPITOR) 10 MG tablet Take 10 mg by mouth daily.    [provider]  bethanechol (URECHOLINE) 5 MG tablet Take 5 mg by mouth 3 (three) times daily.    [provider]  famotidine (PEPCID) 20 MG tablet Take 20 mg by mouth 2 (two) times daily.    [provider]  hydrALAZINE (APRESOLINE) 10 MG tablet Take 10 mg by mouth 3 (three) times daily.    [provider]  insulin aspart (NOVOLOG) 100 UNIT/ML injection Inject 0-9 Units into the skin every 4 (four) hours. 02/11/18   Cherene Altes, MD  lacosamide (VIMPAT) 200 MG  TABS tablet Take 1 tablet (200 mg total) by mouth 2 (two) times daily. 03/25/18   Dohmeier, Asencion Partridge, MD  levETIRAcetam (KEPPRA) 500 MG tablet Take 500 mg by mouth 2 (two) times daily.    [provider]  losartan (COZAAR) 100 MG tablet Take 100 mg by mouth daily.    [provider]  Melatonin 3 MG TABS Take 2 tablets by mouth at bedtime.    [provider]  Multiple Vitamins-Minerals (MULTIVITAMINS THER. W/MINERALS) TABS tablet Take 1 tablet by mouth daily.    [provider]  phenytoin (DILANTIN) 200 MG ER capsule Take 1 capsule (200 mg total) by mouth 3 (three) times daily. 03/25/18   Dohmeier, Asencion Partridge, MD  QUEtiapine (SEROQUEL) 25 MG tablet Take 25 mg by mouth 2 (two) times daily. 25mg  in the am and 50mg  in the pm    [provider]  sertraline (ZOLOFT) 50 MG tablet Take 50 mg by mouth daily.    [provider]  topiramate (TOPAMAX) 200 MG tablet Take 1 tablet (200 mg total) by mouth 2 (two) times daily. 03/25/18   Dohmeier, Asencion Partridge, MD    Family History Family History  Problem Relation Age of Onset  . Hernia Father   . Stroke Brother     Social History Social History   Tobacco Use  . Smoking status: Former Smoker    Packs/day: 2.00    Years: 64.00    Pack years: 128.00    Types: Cigarettes    Last attempt to quit: 07/04/2017    Years since quitting: 0.7  . Smokeless tobacco: Never Used  . Tobacco comment: SINCE I WAS 77 YRS OLD  Substance Use Topics  . Alcohol use: No  . Drug use: No     Allergies   Patient has no known allergies.   Review of Systems Review of Systems  Unable to perform ROS: Mental status change  Psychiatric/Behavioral: Positive for confusion.     Physical Exam Updated Vital Signs BP 129/68   Pulse 65   Temp 98.7 F (37.1 C) (Oral)   Resp (!) 23   SpO2 98%   Physical Exam CONSTITUTIONAL: Elderly, mildly anxious, hard of hearing HEAD: Normocephalic/atraumatic EYES: EOMI/PERRL ENMT: Mucous  membranes moist NECK: supple no meningeal signs, healing trach stoma noted SPINE/BACK:entire spine nontender CV: S1/S2 noted, no murmurs/rubs/gallops noted LUNGS: Coarse breath sounds bilaterally ABDOMEN: soft, nontender, no rebound or guarding, bowel sounds noted throughout abdomen GU:no cva tenderness NEURO: Pt is awake/alert,, moves all extremitiesx4.   No arm/leg drift.  No facial droop.  EXTREMITIES: pulses normal/equal, full ROM Abrasion to left arm, but no signs of extremity trauma SKIN: warm, color normal, well-healing midline sternotomy scar PSYCH: anxious   ED Treatments / Results  Labs (all labs ordered are listed, but only abnormal results are displayed) Labs Reviewed  APTT - Abnormal; Notable for the following components:      Result Value   aPTT 50 (*)  All other components within normal limits  CBC - Abnormal; Notable for the following components:   RBC 3.36 (*)    Hemoglobin 9.1 (*)    HCT 30.1 (*)    RDW 17.4 (*)    All other components within normal limits  COMPREHENSIVE METABOLIC PANEL - Abnormal; Notable for the following components:   Glucose, Bld 102 (*)    Calcium 8.7 (*)    Total Protein 6.2 (*)    Albumin 3.4 (*)    Alkaline Phosphatase 131 (*)    All other components within normal limits  I-STAT CHEM 8, ED - Abnormal; Notable for the following components:   Sodium 134 (*)    Calcium, Ion 1.04 (*)    Hemoglobin 9.2 (*)    HCT 27.0 (*)    All other components within normal limits  PROTIME-INR  DIFFERENTIAL  ETHANOL  PHENYTOIN LEVEL, TOTAL  URINALYSIS, ROUTINE W REFLEX MICROSCOPIC  RAPID URINE DRUG SCREEN, HOSP PERFORMED  I-STAT TROPONIN, ED  CBG MONITORING, ED    EKG EKG Interpretation  Date/Time:  Wednesday March 27 2018 02:59:29 EDT Ventricular Rate:  68 PR Interval:    QRS Duration: 128 QT Interval:  436 QTC Calculation: 464 R Axis:   137 Text Interpretation:  Sinus rhythm Short PR interval Nonspecific intraventricular  conduction delay Lateral infarct, old Probable anteroseptal infarct, old Confirmed by Ripley Fraise ([id number]) on 03/27/2018 3:30:24 AM   Radiology Dg Chest Port 1 View  Result Date: 03/27/2018 CLINICAL DATA:  Fall tonight. New onset altered mental status. Difficulty speaking has resolved. EXAM: PORTABLE CHEST 1 VIEW COMPARISON:  02/12/2018 FINDINGS: Postoperative changes in the mediastinum. Normal heart size and pulmonary vascularity. No focal airspace disease or consolidation in the lungs. No blunting of costophrenic angles. No pneumothorax. Mediastinal contours appear intact. IMPRESSION: No active disease. Electronically Signed   By: Lucienne Capers M.D.   On: 03/27/2018 03:28   Ct Head Code Stroke Wo Contrast  Result Date: 03/27/2018 CLINICAL DATA:  Code stroke. Unable to speak. Last seen normal at midnight. EXAM: CT HEAD WITHOUT CONTRAST TECHNIQUE: Contiguous axial images were obtained from the base of the skull through the vertex without intravenous contrast. COMPARISON:  Head CT 01/28/2018 FINDINGS: Brain: There is no mass, hemorrhage or extra-axial collection. The size and configuration of the ventricles and extra-axial CSF spaces are normal. There are old bilateral basal ganglia lacunar infarcts. There is also an old right frontal infarct. There is hypoattenuation of the periventricular white matter, most commonly indicating chronic ischemic microangiopathy. Vascular: Atherosclerotic calcification of the internal carotid arteries at the skull base. No abnormal hyperdensity of the major intracranial arteries or dural venous sinuses. Skull: The visualized skull base, calvarium and extracranial soft tissues are normal. Sinuses/Orbits: No fluid levels or advanced mucosal thickening of the visualized paranasal sinuses. No mastoid or middle ear effusion. The orbits are normal. ASPECTS [facility] Stroke Program Early CT Score) - Ganglionic level infarction (caudate, lentiform nuclei, internal capsule,  insula, M1-M3 cortex): 7 - Supraganglionic infarction (M4-M6 cortex): 3 Total score (0-10 with 10 being normal): 10 IMPRESSION: 1. No acute hemorrhage or mass effect. 2. ASPECTS is 10. 3. Old right frontal and bilateral basal ganglia lacunar infarcts. These results were communicated to Dr. Roland Rack at 2:56 am on 03/27/2018 by text page via the [facility] messaging system. Electronically Signed   By: Ulyses Jarred M.D.   On: 03/27/2018 02:58    Procedures Procedures (including critical care time)  Medications Ordered in ED Medications  lacosamide (VIMPAT) tablet 200 mg (has no administration in time range)  phenytoin (DILANTIN) ER capsule 200 mg (has no administration in time range)  topiramate (TOPAMAX) tablet 200 mg (has no administration in time range)  levETIRAcetam (KEPPRA) tablet 1,500 mg (has no administration in time range)     Initial Impression / Assessment and Plan / ED Course  I have reviewed the triage vital signs and the nursing notes.  Pertinent labs & imaging results that were available during my care of the patient were reviewed by me and considered in my medical decision making (see chart for details).     3:46 AM Patient chronically ill, recently released from [facility] facility presents for confusion.  He appears to be improving.  Seen by neurology, code stroke canceled.  Patient has a history of significant seizures and is on multiple AEDs.  It is possible he had a seizure was found to be postictal.  He will be admitted for observation monitor.  tPA in stroke considered but not given due to: Symptoms resolved  3:59 AM Discussed with Dr. Blaine Hamper for admission  Final Clinical Impressions(s) / ED Diagnoses   Final diagnoses:  Delirium    ED Discharge Orders    None       Ripley Fraise, MD 03/27/18 [phone]

## 2018-03-27 NOTE — Progress Notes (Signed)
Pt bladder scanned for more than 424ml; MD notified and one time I&O cath order received. Pt I&O cath for 578ml. UA and culture sent as ordered by MD. Will continue to closely monitor pt. Delia Heady RN

## 2018-03-27 NOTE — ED Notes (Signed)
Pt states that he does not want to be admitted, refuses to have IV started

## 2018-03-27 NOTE — ED Triage Notes (Signed)
Pt LSN 0000 when he went to sleep, woke up at 230 to go to bathroom fell, did not hit head, no LOC, hit L elbow, pt has new onset of AMS and was having trouble speaking which has resolved, pt is hard of hearing

## 2018-03-28 ENCOUNTER — Inpatient Hospital Stay: Payer: Medicare Other | Admitting: Pulmonary Disease

## 2018-03-28 DIAGNOSIS — E785 Hyperlipidemia, unspecified: Secondary | ICD-10-CM | POA: Diagnosis present

## 2018-03-28 DIAGNOSIS — Z87891 Personal history of nicotine dependence: Secondary | ICD-10-CM | POA: Diagnosis not present

## 2018-03-28 DIAGNOSIS — K219 Gastro-esophageal reflux disease without esophagitis: Secondary | ICD-10-CM | POA: Diagnosis present

## 2018-03-28 DIAGNOSIS — Z794 Long term (current) use of insulin: Secondary | ICD-10-CM | POA: Diagnosis not present

## 2018-03-28 DIAGNOSIS — Z823 Family history of stroke: Secondary | ICD-10-CM | POA: Diagnosis not present

## 2018-03-28 DIAGNOSIS — J449 Chronic obstructive pulmonary disease, unspecified: Secondary | ICD-10-CM | POA: Diagnosis present

## 2018-03-28 DIAGNOSIS — R4189 Other symptoms and signs involving cognitive functions and awareness: Secondary | ICD-10-CM | POA: Diagnosis present

## 2018-03-28 DIAGNOSIS — G9341 Metabolic encephalopathy: Secondary | ICD-10-CM | POA: Diagnosis not present

## 2018-03-28 DIAGNOSIS — R569 Unspecified convulsions: Secondary | ICD-10-CM | POA: Diagnosis not present

## 2018-03-28 DIAGNOSIS — I10 Essential (primary) hypertension: Secondary | ICD-10-CM | POA: Diagnosis present

## 2018-03-28 DIAGNOSIS — R911 Solitary pulmonary nodule: Secondary | ICD-10-CM | POA: Diagnosis present

## 2018-03-28 DIAGNOSIS — R410 Disorientation, unspecified: Secondary | ICD-10-CM | POA: Diagnosis not present

## 2018-03-28 DIAGNOSIS — D649 Anemia, unspecified: Secondary | ICD-10-CM | POA: Diagnosis not present

## 2018-03-28 DIAGNOSIS — Z951 Presence of aortocoronary bypass graft: Secondary | ICD-10-CM | POA: Diagnosis not present

## 2018-03-28 DIAGNOSIS — Z8673 Personal history of transient ischemic attack (TIA), and cerebral infarction without residual deficits: Secondary | ICD-10-CM | POA: Diagnosis not present

## 2018-03-28 DIAGNOSIS — E039 Hypothyroidism, unspecified: Secondary | ICD-10-CM | POA: Diagnosis present

## 2018-03-28 DIAGNOSIS — D638 Anemia in other chronic diseases classified elsewhere: Secondary | ICD-10-CM | POA: Diagnosis present

## 2018-03-28 DIAGNOSIS — T420X5A Adverse effect of hydantoin derivatives, initial encounter: Secondary | ICD-10-CM | POA: Diagnosis present

## 2018-03-28 DIAGNOSIS — G92 Toxic encephalopathy: Secondary | ICD-10-CM | POA: Diagnosis present

## 2018-03-28 DIAGNOSIS — Z7982 Long term (current) use of aspirin: Secondary | ICD-10-CM | POA: Diagnosis not present

## 2018-03-28 DIAGNOSIS — Z953 Presence of xenogenic heart valve: Secondary | ICD-10-CM | POA: Diagnosis not present

## 2018-03-28 DIAGNOSIS — H9190 Unspecified hearing loss, unspecified ear: Secondary | ICD-10-CM | POA: Diagnosis present

## 2018-03-28 DIAGNOSIS — Z79899 Other long term (current) drug therapy: Secondary | ICD-10-CM | POA: Diagnosis not present

## 2018-03-28 DIAGNOSIS — F329 Major depressive disorder, single episode, unspecified: Secondary | ICD-10-CM | POA: Diagnosis present

## 2018-03-28 DIAGNOSIS — I2510 Atherosclerotic heart disease of native coronary artery without angina pectoris: Secondary | ICD-10-CM | POA: Diagnosis present

## 2018-03-28 LAB — BASIC METABOLIC PANEL
ANION GAP: 6 (ref 5–15)
BUN: 11 mg/dL (ref 8–23)
CALCIUM: 8.5 mg/dL — AB (ref 8.9–10.3)
CO2: 26 mmol/L (ref 22–32)
Chloride: 107 mmol/L (ref 98–111)
Creatinine, Ser: 0.95 mg/dL (ref 0.61–1.24)
GLUCOSE: 106 mg/dL — AB (ref 70–99)
Potassium: 3.6 mmol/L (ref 3.5–5.1)
SODIUM: 139 mmol/L (ref 135–145)

## 2018-03-28 LAB — CBC WITH DIFFERENTIAL/PLATELET
Abs Immature Granulocytes: 0 10*3/uL (ref 0.0–0.1)
BASOS ABS: 0 10*3/uL (ref 0.0–0.1)
BASOS PCT: 1 %
EOS ABS: 0.4 10*3/uL (ref 0.0–0.7)
Eosinophils Relative: 9 %
HCT: 28.5 % — ABNORMAL LOW (ref 39.0–52.0)
Hemoglobin: 8.8 g/dL — ABNORMAL LOW (ref 13.0–17.0)
IMMATURE GRANULOCYTES: 0 %
Lymphocytes Relative: 25 %
Lymphs Abs: 1.2 10*3/uL (ref 0.7–4.0)
MCH: 27.1 pg (ref 26.0–34.0)
MCHC: 30.9 g/dL (ref 30.0–36.0)
MCV: 87.7 fL (ref 78.0–100.0)
Monocytes Absolute: 0.7 10*3/uL (ref 0.1–1.0)
Monocytes Relative: 14 %
NEUTROS PCT: 51 %
Neutro Abs: 2.4 10*3/uL (ref 1.7–7.7)
PLATELETS: 248 10*3/uL (ref 150–400)
RBC: 3.25 MIL/uL — ABNORMAL LOW (ref 4.22–5.81)
RDW: 17.3 % — AB (ref 11.5–15.5)
WBC: 4.8 10*3/uL (ref 4.0–10.5)

## 2018-03-28 LAB — IRON AND TIBC
Iron: 21 ug/dL — ABNORMAL LOW (ref 45–182)
Saturation Ratios: 8 % — ABNORMAL LOW (ref 17.9–39.5)
TIBC: 249 ug/dL — ABNORMAL LOW (ref 250–450)
UIBC: 228 ug/dL

## 2018-03-28 LAB — PROCALCITONIN: Procalcitonin: <0.1 ng/mL

## 2018-03-28 LAB — TSH: TSH: 2.827 u[IU]/mL (ref 0.350–4.500)

## 2018-03-28 LAB — PHENYTOIN LEVEL, TOTAL: Phenytoin Lvl: 28.3 ug/mL — ABNORMAL HIGH (ref 10.0–20.0)

## 2018-03-28 LAB — URINE CULTURE: Culture: NO GROWTH

## 2018-03-28 LAB — T4, FREE: Free T4: 0.47 ng/dL — ABNORMAL LOW (ref 0.82–1.77)

## 2018-03-28 LAB — FERRITIN: Ferritin: 15 ng/mL — ABNORMAL LOW (ref 24–336)

## 2018-03-28 MED ORDER — FERROUS SULFATE 325 (65 FE) MG PO TABS
325.0000 mg | ORAL_TABLET | Freq: Two times a day (BID) | ORAL | Status: DC
  Administered 2018-03-28 – 2018-03-29 (×3): 325 mg via ORAL
  Filled 2018-03-28 (×3): qty 1

## 2018-03-28 MED ORDER — IPRATROPIUM-ALBUTEROL 0.5-2.5 (3) MG/3ML IN SOLN
3.0000 mL | Freq: Two times a day (BID) | RESPIRATORY_TRACT | Status: DC
  Administered 2018-03-28 – 2018-03-29 (×2): 3 mL via RESPIRATORY_TRACT
  Filled 2018-03-28 (×2): qty 3

## 2018-03-28 NOTE — Progress Notes (Signed)
PROGRESS NOTE  KEKAI GETER BPZ:[id number] DOB: [birth year] DOA: 03/27/2018 PCP: Merton Border, MD  HPI/Recap of past 65 hours: 77 year old male with medical history significant for hypertension, hyperlipidemia, COPD, CVA, CAD status post CABG, aortic stenosis status post AVR with bioprosthetic valve on 07/04/2017, seizure, presents to the ED with unresponsiveness.  Patient was noted to be in confused, not oriented.  No focal neurologic deficit noted, denies any chest pain shortness of breath, cough, dysuria, fever/chills, no recent falls.  Patient was recently hospitalized and discharged from our facility in 6/10 due to unresponsiveness which was most likely due to seizures.  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  She was just discharged from [facility] about 2 weeks ago according to the daughter.  In the ED, CT head negative for acute intracranial abnormalities, but showed old stroke. Neurology consulted.  She is admitted for further management.  Today, pt noted to be more awake, alert, oriented. Met pt sitting up in chair. Denies any new complaints.  Assessment/Plan: Principal Problem:   Unresponsiveness Active Problems:   Seizure (HCC)   S/P CABG x 3   COPD, severe (HCC)   Essential hypertension   HLD (hyperlipidemia)   Stroke (cerebrum) (HCC)   GERD (gastroesophageal reflux disease)   Depression   Normocytic anemia   Acute metabolic encephalopathy   Unresponsive  Acute metabolic encephalopathy Resolved Etiology unclear, ??recurrent seizure/post ictal, cognitive delay due to antiepileptics, ??polypharmacy, phenytoin level noted to be elevated Unlikely to be sepsis, UA/UC unremarkable, urine drug screen negative Lactic acid WNL, procalcitonin negative Chest X-ray and CT chest showed no evidence of pneumonia, just emphysema CT head negative for any acute intracranial abnormalities, showed old stroke Neurology consulted, recommended  EEG and continuing home seizure meds EEG is characterized by slowing which is consistent with normal drowse. Can not rule out the possibility of slowing related to general cerebral disturbance such as a metabolic encephalopathy. No epileptiform activity is noted.   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 Neurology on board Phenytoin level noted to be elevated, currently held Continue Topamax, Dilantin, Keppra, lacosamide Seizure precautions  Anemia of chronic disease/iron def anemia Baseline hemoglobin between 9-10 No signs of bleeding Iron panel showed Fe 21, TIBC 249, Sats 8, ferritin 15 Start iron supplementation  COPD/Hx of recent trach placement CT chest showing emphysema, no consolidation/pneumonia noted Trach site still patent, with some brownish discharge noted Tracheal aspirate culture ordered Continue duonebs, albuterol as needed Supplemental oxygen as needed  CAD/HLD Status post CABG Continue aspirin, Lipitor  Hypertension Continue hydralazine, Cozaar  History of CVA Continue ASA, Lipitor  Depression Continue Zoloft      Code Status: Full  Family Communication: None at bedside  Disposition Plan: Home once cleared by neurology   Consultants:  Neurology  Procedures:  None  Antimicrobials:  None  DVT prophylaxis: Lovenox   Objective: Vitals:   03/28/18 0758 03/28/18 0851 03/28/18 1151 03/28/18 1555  BP: 116/65  100/66 116/66  Pulse: 63 64 66   Resp: _0 Temp: 97.7 F (36.5 C)  98.4 F (36.9 C)   TempSrc: Axillary  Oral   SpO2: 94% 96% 94%   Weight:      Height:        Intake/Output Summary (Last 24 hours) at 03/28/2018 1649 Last data filed at 03/28/2018 0143 Gross per 24 hour  Intake -  Output 300 ml  Net -300 ml   Filed Weights   03/27/18 0555  Weight: 72.6 kg (160 lb 0.9 oz)    Exam:   General: Alert,  awake, oriented  Cardiovascular: S1, S2 present  Respiratory: Diminished breath sounds bilaterally, trach site still patent  Abdomen: Soft, nontender, nondistended, bowel sounds present  Musculoskeletal: No pedal edema bilaterally  Skin: Normal  Psychiatry: Normal mood   Data Reviewed: CBC: Recent Labs  Lab 03/27/18 0241 03/27/18 0247 03/27/18 0719 03/28/18 0359  WBC 5.9  --  5.7 4.8  NEUTROABS 3.3  --   --  2.4  HGB 9.1* 9.2* 8.7* 8.8*  HCT 30.1* 27.0* 28.1* 28.5*  MCV 89.6  --  87.3 87.7  PLT 244  --  234 163   Basic Metabolic Panel: Recent Labs  Lab 03/27/18 0241 03/27/18 0247 03/27/18 0719 03/28/18 0359  NA 135 134* 138 139  K 4.1 4.1 3.7 3.6  CL 103 102 105 107  CO2 22  --  25 26  GLUCOSE 102* 98 98 106*  BUN _0 CREATININE 0.97 1.00 0.85 0.95  CALCIUM 8.7*  --  8.6* 8.5*   GFR: Estimated Creatinine Clearance: 58.8 mL/min (by C-G formula based on SCr of 0.95 mg/dL). Liver Function Tests: Recent Labs  Lab 03/27/18 0241  AST 19  ALT 14  ALKPHOS 131*  BILITOT 0.5  PROT 6.2*  ALBUMIN 3.4*   No results for input(s): LIPASE, AMYLASE in the last 168 hours. No results for input(s): AMMONIA in the last 168 hours. Coagulation Profile: Recent Labs  Lab 03/27/18 0241  INR 1.06   Cardiac Enzymes: No results for input(s): CKTOTAL, CKMB, CKMBINDEX, TROPONINI in the last 168 hours. BNP (last 3 results) No results for input(s): PROBNP in the last 8760 hours. HbA1C: No results for input(s): HGBA1C in the last 72 hours. CBG: Recent Labs  Lab 03/27/18 0242  GLUCAP 94   Lipid Profile: No results for input(s): CHOL, HDL, LDLCALC, TRIG, CHOLHDL, LDLDIRECT in the last 72 hours. Thyroid Function Tests: Recent Labs    03/28/18 0359  FREET4 0.47*   Anemia Panel: Recent Labs    03/28/18 0359  FERRITIN 15*  TIBC 249*  IRON 21*   Urine analysis:    Component Value Date/Time   COLORURINE YELLOW 03/27/2018 1649   APPEARANCEUR CLEAR  03/27/2018 1649   LABSPEC 1.006 03/27/2018 1649   PHURINE 7.0 03/27/2018 1649   GLUCOSEU NEGATIVE 03/27/2018 1649   HGBUR NEGATIVE 03/27/2018 1649   BILIRUBINUR NEGATIVE 03/27/2018 1649   KETONESUR NEGATIVE 03/27/2018 1649   PROTEINUR NEGATIVE 03/27/2018 1649   NITRITE NEGATIVE 03/27/2018 1649   LEUKOCYTESUR NEGATIVE 03/27/2018 1649   Sepsis Labs: _1 (procalcitonin:4,lacticidven:4)  ) Recent Results (from the past 240 hour(s))  Aerobic Culture (superficial specimen)     Status: None (Preliminary result)   Collection Time: 03/27/18  3:32 PM  Result Value Ref Range Status   Specimen Description TRACHEAL SITE  Final   Special Requests NONE  Final   Gram Stain   Final    MODERATE WBC PRESENT, PREDOMINANTLY PMN FEW GRAM POSITIVE COCCI    Culture   Final    MODERATE STAPHYLOCOCCUS AUREUS SUSCEPTIBILITIES TO FOLLOW Performed at [facility], [place] [address], [place], [place] [address]    Report Status PENDING  Incomplete  Urine Culture     Status: None   Collection Time: 03/27/18  4:37 PM  Result Value Ref Range Status   Specimen Description URINE, CATHETERIZED  Final  Special Requests NONE  Final   Culture   Final    NO GROWTH Performed at [facility], [place] [address], [place],  [address]    Report Status 03/28/2018 FINAL  Final      Studies: No results found.  Scheduled Meds: . aspirin  325 mg Per Tube Daily  . atorvastatin  10 mg Oral Daily  . bethanechol  5 mg Oral TID  . enoxaparin (LOVENOX) injection  40 mg Subcutaneous Daily  . famotidine  20 mg Oral BID  . hydrALAZINE  10 mg Oral TID  . ipratropium-albuterol  3 mL Nebulization BID  . lacosamide  200 mg Oral BID  . levETIRAcetam  1,500 mg Oral BID  . losartan  100 mg Oral Daily  . Melatonin  2 tablet Oral QHS  . multivitamin with minerals  1 tablet Oral Daily  . QUEtiapine  25 mg Oral Daily  . QUEtiapine  50 mg Oral QHS  . sertraline  50 mg Oral Daily  . topiramate  200 mg Oral  BID    Continuous Infusions:   LOS: 0 days     Alma Friendly, MD [facility]   If 7PM-7AM, please contact night-coverage [email or web address] Password [facility] 03/28/2018, 4:49 PM

## 2018-03-28 NOTE — Progress Notes (Signed)
Interim Note. No charge  Phenytoin level 28.3 this morning. Continue to hold.  Recheck level tomorrow

## 2018-03-28 NOTE — Evaluation (Signed)
Occupational Therapy Evaluation Patient Details Name: Danny Krause MRN: [id number] DOB: [birth year] Today's Date: 03/28/2018    History of Present Illness Pt is a 77 y/o male admitted secondary to fall from episode of unresponsiveness. Pt also with AMS. CT negative for acute abnormality. CT of chest revealed trace L pleural effusion. Pt had EEG as well which was characterized by slowing. PMH includes COPD, HTN, CAD s/p CABG, CVA, and seizures.    Clinical Impression   This 77 y/o male presents with the above. At baseline pt reports mod independence with ADLs and functional mobility using rollator, lives with spouse. Pt demonstrates room and hallway level functional mobility using rollator this session, initially with minA, progressed to minguard assist throughout and with min verbal cues for safe use of DME. Pt currently requires minguard assist for UB and LB ADLs. Will benefit from continued acute OT services and recommend follow up [facility] services after discharge to maximize his safety and independence with ADLs and mobility.     Follow Up Recommendations  Home health OT;Supervision/Assistance - 24 hour(24hr initially )    Equipment Recommendations  None recommended by OT           Precautions / Restrictions Precautions Precautions: Fall Restrictions Weight Bearing Restrictions: No      Mobility Bed Mobility Overal bed mobility: Needs Assistance Bed Mobility: Supine to Sit     Supine to sit: Supervision     General bed mobility comments: supervision for safety with HOB elevated, no physical assist required   Transfers Overall transfer level: Needs assistance Equipment used: None Transfers: Sit to/from Stand Sit to Stand: Min guard;+2 safety/equipment         General transfer comment: minguard for safety and to ensure pt steady upon standing; requires VCs as pt pulling up on rollator (vs use of UEs on EOB) and doing so without brakes locked     Balance Overall  balance assessment: Needs assistance Sitting-balance support: No upper extremity supported;Feet supported Sitting balance-Leahy Scale: Good Sitting balance - Comments: donning socks EOB with no LOB    Standing balance support: Single extremity supported;During functional activity Standing balance-Leahy Scale: Fair Standing balance comment: use of UE support for dynamic mobility                            ADL either performed or assessed with clinical judgement   ADL Overall ADL's : Needs assistance/impaired Eating/Feeding: Modified independent;Sitting   Grooming: Min guard;Standing   Upper Body Bathing: Min guard;Sitting   Lower Body Bathing: Min guard;Sit to/from stand   Upper Body Dressing : Set up;Sitting   Lower Body Dressing: Min guard;Sit to/from stand Lower Body Dressing Details (indicate cue type and reason): pt able to don socks seated EOB given increased time/effort, use of figure 4 technique; close minguard for safety during sit<>stand and for standing balance  Toilet Transfer: Min guard;Ambulation;Regular Toilet;Grab bars Toilet Transfer Details (indicate cue type and reason): using rollator; simulated in transfer to [place] and Hygiene: Min guard;Sit to/from stand       Functional mobility during ADLs: Min guard(using rollator ) General ADL Comments: pt completing room and hallway functional mobility, initially requiring minA though progressed to minguard; pt fatigued with activity but is motivated to return home     Vision         Perception     Praxis      Pertinent Vitals/Pain Pain Assessment: No/denies  pain     Hand Dominance Right   Extremity/Trunk Assessment Upper Extremity Assessment Upper Extremity Assessment: Generalized weakness(intermittently with tremors bil UEs )   Lower Extremity Assessment Lower Extremity Assessment: Defer to PT evaluation   Cervical / Trunk Assessment Cervical / Trunk  Assessment: Kyphotic   Communication Communication Communication: HOH   Cognition Arousal/Alertness: Awake/alert Behavior During Therapy: WFL for tasks assessed/performed Overall Cognitive Status: No family/caregiver present to determine baseline cognitive functioning                                 General Comments: pt requires min safety cues for use of rollator; slow to respond to questions at times though suspect this is partly due to pt being [facility]    General Comments  pt on RA during activity with SpO2 remaining above 90%    Exercises     Shoulder Instructions      Home Living Family/patient expects to be discharged to:: Private residence Living Arrangements: Spouse/significant other Available Help at Discharge: Family;Available 24 hours/day Type of Home: House Home Access: Stairs to enter [employer] of Steps: 1 Entrance Stairs-Rails: None Home Layout: One level     Bathroom Shower/Tub: Walk-in shower(handicapped shower and bathtub )   Bathroom Toilet: Standard     Home Equipment: Environmental consultant - 2 wheels;Cane - single point;Shower seat;Shower seat - built in   Additional Comments: Info obtained from previous admission and pt report. pt reports he lives with spouse who has an amputation      Prior Functioning/Environment Level of Independence: Independent with assistive device(s)        Comments: pt reports using a rollator at baseline for functional mobility, reports he completes ADLs without assist         OT Problem List: Decreased strength;Impaired balance (sitting and/or standing);Decreased activity tolerance;Decreased safety awareness      OT Treatment/Interventions: Self-care/ADL training;DME and/or AE instruction;Therapeutic activities;Balance training;Therapeutic exercise;Energy conservation;Patient/family education    OT Goals(Current goals can be found in the care plan section) Acute Rehab OT Goals Patient Stated Goal: wants to  go home to his wife  OT Goal Formulation: With patient Time For Goal Achievement: 04/11/18 Potential to Achieve Goals: Good  OT Frequency: Min 2X/week   Barriers to D/C:            Co-evaluation PT/OT/SLP Co-Evaluation/Treatment: Yes Reason for Co-Treatment: For patient/therapist safety;To address functional/ADL transfers   OT goals addressed during session: Proper use of Adaptive equipment and DME;ADL's and self-care      AM-PAC PT "6 Clicks" Daily Activity     Outcome Measure Help from another person eating meals?: None Help from another person taking care of personal grooming?: A Little Help from another person toileting, which includes using toliet, bedpan, or urinal?: A Little Help from another person bathing (including washing, rinsing, drying)?: A Little Help from another person to put on and taking off regular upper body clothing?: A Little Help from another person to put on and taking off regular lower body clothing?: A Little 6 Click Score: 19   End of Session Equipment Utilized During Treatment: Gait belt;Other (comment)(rollator ) Nurse Communication: Mobility status  Activity Tolerance: Patient tolerated treatment well Patient left: in chair;with call bell/phone within reach;with chair alarm set  OT Visit Diagnosis: Muscle weakness (generalized) (M62.81)                Time: 5409-8119 OT Time Calculation (min): 27  min Charges:  OT General Charges $OT Visit: 1 Visit OT Evaluation $OT Eval Moderate Complexity: 1 Mod G-Codes:     Lou Cal, OT Pager [phone] 03/28/2018   Raymondo Band 03/28/2018, 10:51 AM

## 2018-03-28 NOTE — Progress Notes (Signed)
Physical Therapy Treatment Patient Details Name: Danny Krause MRN: [id number] DOB: 02/11/1941 Today's Date: 03/28/2018    History of Present Illness Pt is a 77 y/o male admitted secondary to fall from episode of unresponsiveness. Pt also with AMS. CT negative for acute abnormality. CT of chest revealed trace L pleural effusion. Pt had EEG as well which was characterized by slowing. PMH includes COPD, HTN, CAD s/p CABG, CVA, and seizures.     PT Comments    Patient is making progress toward PT goals and tolerated increased activity this session. Pt requires supervision/min guard assist for OOB mobility and ambulated today with rollator. Pt reports he has all DME needs at home. Pt will continue to benefit from further skilled PT services to maximize independence and safety with mobility.     Follow Up Recommendations  Supervision/Assistance - 24 hour;Home health PT     Equipment Recommendations  None recommended by PT    Recommendations for Other Services       Precautions / Restrictions Precautions Precautions: Fall Restrictions Weight Bearing Restrictions: No    Mobility  Bed Mobility Overal bed mobility: Needs Assistance Bed Mobility: Supine to Sit     Supine to sit: Supervision     General bed mobility comments: supervision for safety with HOB elevated, no physical assist required   Transfers Overall transfer level: Needs assistance Equipment used: None Transfers: Sit to/from Stand Sit to Stand: Min guard;+2 safety/equipment         General transfer comment: minguard for safety and to ensure pt steady upon standing; requires VCs as pt pulling up on rollator (vs use of UEs on EOB) and doing so without brakes locked   Ambulation/Gait Ambulation/Gait assistance: Min guard;Supervision Gait Distance (Feet): 300 Feet Assistive device: (rollator) Gait Pattern/deviations: Step-through pattern;Decreased stride length;Trunk flexed Gait velocity: Decreased     General Gait Details: cues for increased stride length and cadence; safe use of AD demonstrated while ambulating    Stairs             Wheelchair Mobility    Modified Rankin (Stroke Patients Only)       Balance Overall balance assessment: Needs assistance Sitting-balance support: No upper extremity supported;Feet supported Sitting balance-Leahy Scale: Good Sitting balance - Comments: donning socks EOB with no LOB    Standing balance support: Single extremity supported;During functional activity Standing balance-Leahy Scale: Fair Standing balance comment: use of UE support for dynamic mobility                             Cognition Arousal/Alertness: Awake/alert Behavior During Therapy: WFL for tasks assessed/performed Overall Cognitive Status: No family/caregiver present to determine baseline cognitive functioning                                 General Comments: pt requires min safety cues for use of rollator; slow to respond to questions at times though suspect this is partly due to pt being [facility]       Exercises      General Comments General comments (skin integrity, edema, etc.): SpO2 >90% on RA throughout session      Pertinent Vitals/Pain Pain Assessment: No/denies pain    Home Living Family/patient expects to be discharged to:: Private residence Living Arrangements: Spouse/significant other Available Help at Discharge: Family;Available 24 hours/day Type of Home: House Home Access: Stairs to enter Entrance Stairs-Rails:  None Home Layout: One level Home Equipment: Walker - 2 wheels;Cane - single point;Shower seat;Shower seat - built in Additional Comments: Info obtained from previous admission and pt report. pt reports he lives with spouse who has an amputation    Prior Function Level of Independence: Independent with assistive device(s)      Comments: pt reports using a rollator at baseline for functional mobility, reports he  completes ADLs without assist    PT Goals (current goals can now be found in the care plan section) Acute Rehab PT Goals Patient Stated Goal: wants to go home to his wife  PT Goal Formulation: With patient Time For Goal Achievement: 04/10/18 Potential to Achieve Goals: Good Progress towards PT goals: Progressing toward goals    Frequency    Min 3X/week      PT Plan Discharge plan needs to be updated    Co-evaluation   Reason for Co-Treatment: For patient/therapist safety;To address functional/ADL transfers   OT goals addressed during session: Proper use of Adaptive equipment and DME;ADL's and self-care      AM-PAC PT "6 Clicks" Daily Activity  Outcome Measure  Difficulty turning over in bed (including adjusting bedclothes, sheets and blankets)?: A Little Difficulty moving from lying on back to sitting on the side of the bed? : A Little Difficulty sitting down on and standing up from a chair with arms (e.g., wheelchair, bedside commode, etc,.)?: A Little Help needed moving to and from a bed to chair (including a wheelchair)?: A Little Help needed walking in hospital room?: A Little Help needed climbing 3-5 steps with a railing? : A Lot 6 Click Score: 17    End of Session Equipment Utilized During Treatment: Gait belt Activity Tolerance: Patient tolerated treatment well Patient left: with call bell/phone within reach;in chair;with chair alarm set Nurse Communication: Mobility status PT Visit Diagnosis: Unsteadiness on feet (R26.81);Other abnormalities of gait and mobility (R26.89);Muscle weakness (generalized) (M62.81)     Time: 7841-2820 PT Time Calculation (min) (ACUTE ONLY): 30 min  Charges:  $Gait Training: 8-22 mins                     Earney Navy, PTA Pager: [phone]     Darliss Cheney 03/28/2018, 11:12 AM

## 2018-03-29 LAB — CBC WITH DIFFERENTIAL/PLATELET
Abs Immature Granulocytes: 0 10*3/uL (ref 0.0–0.1)
Basophils Absolute: 0 10*3/uL (ref 0.0–0.1)
Basophils Relative: 1 %
Eosinophils Absolute: 0.5 10*3/uL (ref 0.0–0.7)
Eosinophils Relative: 8 %
HCT: 31.3 % — ABNORMAL LOW (ref 39.0–52.0)
Hemoglobin: 9.4 g/dL — ABNORMAL LOW (ref 13.0–17.0)
Immature Granulocytes: 0 %
Lymphocytes Relative: 20 %
Lymphs Abs: 1.2 10*3/uL (ref 0.7–4.0)
MCH: 26.5 pg (ref 26.0–34.0)
MCHC: 30 g/dL (ref 30.0–36.0)
MCV: 88.2 fL (ref 78.0–100.0)
MONOS PCT: 12 %
Monocytes Absolute: 0.7 10*3/uL (ref 0.1–1.0)
NEUTROS PCT: 59 %
Neutro Abs: 3.8 10*3/uL (ref 1.7–7.7)
Platelets: 275 10*3/uL (ref 150–400)
RBC: 3.55 MIL/uL — ABNORMAL LOW (ref 4.22–5.81)
RDW: 17 % — ABNORMAL HIGH (ref 11.5–15.5)
WBC: 6.3 10*3/uL (ref 4.0–10.5)

## 2018-03-29 LAB — AEROBIC CULTURE W GRAM STAIN (SUPERFICIAL SPECIMEN)

## 2018-03-29 LAB — BASIC METABOLIC PANEL
ANION GAP: 7 (ref 5–15)
BUN: 12 mg/dL (ref 8–23)
CO2: 25 mmol/L (ref 22–32)
Calcium: 8.8 mg/dL — ABNORMAL LOW (ref 8.9–10.3)
Chloride: 106 mmol/L (ref 98–111)
Creatinine, Ser: 0.99 mg/dL (ref 0.61–1.24)
GFR calc Af Amer: >60 mL/min (ref >60)
Glucose, Bld: 90 mg/dL (ref 70–99)
Potassium: 4.3 mmol/L (ref 3.5–5.1)
Sodium: 138 mmol/L (ref 135–145)

## 2018-03-29 LAB — PHENYTOIN LEVEL, TOTAL: Phenytoin Lvl: 21.7 ug/mL — ABNORMAL HIGH (ref 10.0–20.0)

## 2018-03-29 MED ORDER — SERTRALINE HCL 50 MG PO TABS: 50.0000 mg | ORAL_TABLET | Freq: Every day | ORAL | 0 refills | Status: DC

## 2018-03-29 MED ORDER — PHENYTOIN SODIUM EXTENDED 100 MG PO CAPS: 100.0000 mg | ORAL_CAPSULE | Freq: Three times a day (TID) | ORAL | 0 refills | Status: DC

## 2018-03-29 MED ORDER — MELATONIN 3 MG PO TABS: 6.0000 mg | ORAL_TABLET | Freq: Every day | ORAL | 0 refills | Status: AC

## 2018-03-29 MED ORDER — LACOSAMIDE 200 MG PO TABS: 200.0000 mg | ORAL_TABLET | Freq: Two times a day (BID) | ORAL | 0 refills | Status: DC

## 2018-03-29 MED ORDER — FAMOTIDINE 20 MG PO TABS: 20.0000 mg | ORAL_TABLET | Freq: Two times a day (BID) | ORAL | 0 refills | Status: DC

## 2018-03-29 MED ORDER — FERROUS SULFATE 325 (65 FE) MG PO TABS: 325.0000 mg | ORAL_TABLET | Freq: Every day | ORAL | 0 refills | Status: DC

## 2018-03-29 MED ORDER — LOSARTAN POTASSIUM 100 MG PO TABS: 100.0000 mg | ORAL_TABLET | Freq: Every day | ORAL | 0 refills | Status: DC

## 2018-03-29 MED ORDER — SENNOSIDES-DOCUSATE SODIUM 8.6-50 MG PO TABS: 1.0000 | ORAL_TABLET | Freq: Every day | ORAL | 0 refills | Status: AC

## 2018-03-29 MED ORDER — HYDRALAZINE HCL 10 MG PO TABS: 10.0000 mg | ORAL_TABLET | Freq: Three times a day (TID) | ORAL | 0 refills | Status: DC

## 2018-03-29 MED ORDER — THERA M PLUS PO TABS: 1.0000 | ORAL_TABLET | Freq: Every day | ORAL | 0 refills | Status: DC

## 2018-03-29 MED ORDER — PHENYTOIN SODIUM EXTENDED 100 MG PO CAPS
100.0000 mg | ORAL_CAPSULE | Freq: Three times a day (TID) | ORAL | Status: DC
  Filled 2018-03-29: qty 1

## 2018-03-29 MED ORDER — BETHANECHOL CHLORIDE 5 MG PO TABS: 5.0000 mg | ORAL_TABLET | Freq: Three times a day (TID) | ORAL | 0 refills | Status: AC

## 2018-03-29 MED ORDER — ATORVASTATIN CALCIUM 10 MG PO TABS: 10.0000 mg | ORAL_TABLET | Freq: Every day | ORAL | 0 refills | Status: DC

## 2018-03-29 MED ORDER — ASPIRIN 325 MG PO TABS: 325.0000 mg | ORAL_TABLET | Freq: Every day | ORAL | 0 refills | Status: DC

## 2018-03-29 MED ORDER — QUETIAPINE FUMARATE 25 MG PO TABS: 25.0000 mg | ORAL_TABLET | ORAL | 0 refills | Status: DC

## 2018-03-29 NOTE — Progress Notes (Signed)
NO CHARGE  Palliative Note:   Palliative consult received 7/25. I introduced myself to daughter on yesterday and planned to follow-up with daughter today in regards to goals of care. Consult was discontinued later in the afternoon on 7/25. Spoke with Dr. Horris Latino in regards to cancelled consult. Advise that I did not need to follow up with patient or daughter at this time. Patient has improved and plan is in place. Writer did not see patient or family at advisement of attending.   Thank you for your consult. We will be happy to assist if future needs arise.   Alda Lea, AGNP-BC Palliative Medicine Team  Phone: [phone]

## 2018-03-29 NOTE — Progress Notes (Signed)
Pt discharge education and instructions completed with pt and daughter at bedside; both voices understanding and denies any questions. Pt IV and telemetry removed; pt discharge home with daughter to transport him home. Pt to pick up electronically sent prescriptions from preferred pharmacy on file. Pt transported off unit via wheelchair with belongings and daughter to the side. Francis Gaines Alexea Blase RN.

## 2018-03-29 NOTE — Care Management Note (Signed)
Case Management Note  Patient Details  Name: Danny Krause MRN: [id number] Date of Birth: June 17, 1941  Subjective/Objective:                    Action/Plan: Pt is discharging home with resumption of [place] services. CM notified Dorian Pod with [facility] of d/c and of orders.  Pts daughter to provide transportation home.   Expected Discharge Date:  03/29/18               Expected Discharge Plan:  [place]  In-House Referral:     Discharge planning Services  CM Consult  Post Acute Care Choice:  Home Health, Resumption of Svcs/PTA Provider Choice offered to:  Patient, Adult Children  DME Arranged:    DME Agency:     HH Arranged:  RN, PT, OT, Speech Therapy, Social Work [place] Agency:  [facility]  Status of Service:  Completed, signed off  If discussed at [employer] of Stay Meetings, dates discussed:    Additional Comments:  Pollie Friar, RN 03/29/2018, 3:50 PM

## 2018-03-29 NOTE — Discharge Summary (Addendum)
Discharge Summary  Danny Krause WCB:[id number] DOB: 18-Jul-1941  PCP: Merton Border, MD  Admit date: 03/27/2018 Discharge date: 03/29/2018  Time spent: 35 mins  Recommendations for Outpatient Follow-up:  1. PCP in 1 week 2. Neurology  Discharge Diagnoses:  Active Hospital Problems   Diagnosis Date Noted  . Unresponsiveness 03/27/2018  . Unresponsive 03/28/2018  . Essential hypertension 03/27/2018  . HLD (hyperlipidemia) 03/27/2018  . Stroke (cerebrum) ([place]) 03/27/2018  . GERD (gastroesophageal reflux disease) 03/27/2018  . Depression 03/27/2018  . Normocytic anemia 03/27/2018  . Acute metabolic encephalopathy 61/60/7371  . COPD, severe ([place]) 09/21/2017  . 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 Seizure ([place]) 03/13/2017    Resolved Hospital Problems  No resolved problems to display.    Discharge Condition: Stable  Diet recommendation: Heart healthy  Vitals:   03/29/18 1238 03/29/18 1507  BP: 129/69 133/70  Pulse:    Resp:    Temp: 97.6 F (36.4 C) 98.1 F (36.7 C)  SpO2:      History of present illness:  77 year old male with medical history significant for hypertension, hyperlipidemia, COPD, CVA, CAD status post CABG, aortic stenosis status post AVR with bioprosthetic valve on 07/04/2017, seizure, presents to the ED with unresponsiveness.  Patient was noted to be in confused, not oriented.  No focal neurologic deficit noted, denies any chest pain shortness of breath, cough, dysuria, fever/chills, no recent falls.  Patient was recently hospitalized and discharged from our facility in 6/10 due to unresponsiveness which was most likely due to seizures.  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  She was just discharged from [facility] about 2 weeks ago according to the daughter.  In the ED, CT head negative for acute intracranial abnormalities, but showed old stroke. Neurology consulted. Pt is admitted for further management.  Today, pt  denies any new complaints, very eager to be discharged. Neurology ok with discharge from their standpoint. Extensively discussed with daughter about overall care and medication changes and compliance. Pt discharged with [facility] PT/RN/CSW    Hospital Course:  Principal Problem:   Unresponsiveness Active Problems:   Seizure ([place])   S/P CABG x 3   COPD, severe ([facility])   Essential hypertension   HLD (hyperlipidemia)   Stroke (cerebrum) ([facility])   GERD (gastroesophageal reflux disease)   Depression   Normocytic anemia   Acute metabolic encephalopathy   Unresponsive  Acute metabolic encephalopathy Resolved Likely due to supra-therapeutic level of phenytoin, unlikely recurrent seizure/post ictal Unlikely to be sepsis, UA/UC unremarkable, urine drug screen negative Lactic acid WNL, procalcitonin negative Chest X-ray and CT chest showed no evidence of pneumonia, just emphysema CT head negative for any acute intracranial abnormalities, showed old stroke Neurology consulted, recommended EEG and continuing home seizure meds EEG is characterized by slowing which is consistent with normal drowse. Can not rule out the possibility of slowing related to general cerebral disturbance such as a metabolic encephalopathy. 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 Neurology on board rec adjusting phenytoin to 100 mg TID Phenytoin level noted to be elevated, currently held, restart upon discharge  Continue Topamax, Dilantin, Keppra, lacosamide Follow up with neurology  Anemia of chronic disease/iron def anemia Baseline hemoglobin between 9-10 No signs of bleeding Iron panel showed Fe 21, TIBC 249, Sats 8, ferritin 15 Continue iron supplementation, bowel regimen  COPD/Hx of recent trach placement CT chest showing emphysema, no consolidation/pneumonia noted Trach site still  patent, with some brownish discharge noted, that needed  cleaning, now resolved, no further discharge since then Tracheal aspirate culture grew pan-sensitive staph aureus likely normal flora around skin. No signs of infection as mentioned above, afebrile, no leukocytosis, sepsis work-up negative Continue inhalers, PCP to follow up  ??Subclinical hypothyroidism TSH 2.82, free T4 0.47 PCP to repeat labs and monitor  CAD/HLD Status post CABG Continue aspirin, Lipitor  Hypertension Continue hydralazine, Cozaar  History of CVA Continue ASA, Lipitor  Depression Continue Zoloft      Procedures:  None  Consultations:  Neurology  Discharge Exam: BP 133/70 (BP Location: Right Arm)   Pulse 69   Temp 98.1 F (36.7 C) (Oral)   Resp 20   Ht 5\' 6"  (1.676 m)   Wt 72.6 kg (160 lb 0.9 oz)   SpO2 95%   BMI 25.83 kg/m   General: NAD Cardiovascular: S1, S2 present Respiratory: CTA B  Discharge Instructions You were cared for by a hospitalist during your hospital stay. If you have any questions about your discharge medications or the care you received while you were in the hospital after you are discharged, you can call the unit and asked to speak with the hospitalist on call if the hospitalist that took care of you is not available. Once you are discharged, your primary care physician will handle any further medical issues. Please note that NO REFILLS for any discharge medications will be authorized once you are discharged, as it is imperative that you return to your primary care physician (or establish a relationship with a primary care physician if you do not have one) for your aftercare needs so that they can reassess your need for medications and monitor your lab values.   Allergies as of 03/29/2018   No Known Allergies     Medication List    TAKE these medications   acetaminophen 325 MG tablet Commonly known as:  TYLENOL Take 325 mg by mouth every 6 (six) hours as needed for mild pain, fever or headache.   aspirin 325 MG  tablet Place 1 tablet (325 mg total) into feeding tube daily.   atorvastatin 10 MG tablet Commonly known as:  LIPITOR Take 1 tablet (10 mg total) by mouth daily.   bethanechol 5 MG tablet Commonly known as:  URECHOLINE Take 1 tablet (5 mg total) by mouth 3 (three) times daily.   famotidine 20 MG tablet Commonly known as:  PEPCID Take 1 tablet (20 mg total) by mouth 2 (two) times daily.   ferrous sulfate 325 (65 FE) MG tablet Take 1 tablet (325 mg total) by mouth daily with breakfast.   hydrALAZINE 10 MG tablet Commonly known as:  APRESOLINE Take 1 tablet (10 mg total) by mouth 3 (three) times daily.   lacosamide 200 MG Tabs tablet Commonly known as:  VIMPAT Take 1 tablet (200 mg total) by mouth 2 (two) times daily.   levETIRAcetam 500 MG tablet Commonly known as:  KEPPRA Take 1,500 mg by mouth 2 (two) times daily.   losartan 100 MG tablet Commonly known as:  COZAAR Take 1 tablet (100 mg total) by mouth daily.   Melatonin 3 MG Tabs Take 2 tablets (6 mg total) by mouth at bedtime.   multivitamins ther. w/minerals Tabs tablet Take 1 tablet by mouth daily.   phenytoin 100 MG ER capsule Commonly known as:  DILANTIN Take 1 capsule (100 mg total) by mouth 3 (three) times daily. What changed:    medication strength  how  much to take   QUEtiapine 25 MG tablet Commonly known as:  SEROQUEL Take 1 tablet (25 mg total) by mouth See admin instructions. 25mg  in the am and 50mg  in the pm   senna-docusate 8.6-50 MG tablet Commonly known as:  Senokot-S Take 1 tablet by mouth at bedtime.   sertraline 50 MG tablet Commonly known as:  ZOLOFT Take 1 tablet (50 mg total) by mouth daily.   topiramate 200 MG tablet Commonly known as:  TOPAMAX Take 1 tablet (200 mg total) by mouth 2 (two) times daily.      No Known Allergies Follow-up Information    Merton Border, MD. Schedule an appointment as soon as possible for a visit in 1 week(s).   Specialty:  Internal  Medicine Contact information: [place] [address] [phone]        Pixie Casino, MD .   Specialty:  Cardiology Contact information: [address] [place] [address] [phone]            The results of significant diagnostics from this hospitalization (including imaging, microbiology, ancillary and laboratory) are listed below for reference.    Significant Diagnostic Studies: Ct Chest Wo Contrast  Result Date: 03/27/2018 CLINICAL DATA:  COPD exacerbation, uncomplicated; Lung nodule, >=1cm Aspiration, known or suspected. EXAM: CT CHEST WITHOUT CONTRAST TECHNIQUE: Multidetector CT imaging of the chest was performed following the standard protocol without IV contrast. COMPARISON:  Chest x-ray dated 03/27/2018.  No prior chest CT. FINDINGS: Cardiovascular: Heart size is upper normal. No significant pericardial effusion. Aortic atherosclerosis. No thoracic aortic aneurysm. Diffuse coronary artery calcifications. Surgical changes of a presumed CABG. Mediastinum/Nodes: No mass or enlarged lymph nodes seen within the mediastinum or perihilar regions. Esophagus appears normal. Trachea and central bronchi are unremarkable. Lungs/Pleura: Emphysematous changes, at least moderate in degree, upper lobe predominant. Some portions of the lungs are difficult to definitively characterize due to patient breathing motion artifact, however, there is no discrete pulmonary nodule. Minimal consolidations within the dependent portions of the lower lobes bilaterally, atelectasis more likely than aspiration. Minimal LEFT pleural effusion. No pneumothorax. Upper Abdomen: No acute abnormality. Musculoskeletal: Mild degenerative spondylosis within the thoracic spine. No acute or suspicious osseous finding. IMPRESSION: 1. Emphysema, at least moderate in degree, upper lobe predominant. 2. No pulmonary nodule identified, with mild study limitations detailed above. 3.  Minimal consolidations within the dependent portions of the lower lobes bilaterally, more likely mild atelectasis than aspiration. No evidence of pneumonia or pulmonary edema. 4. Trace LEFT pleural effusion. Aortic Atherosclerosis (ICD10-I70.0) and Emphysema (ICD10-J43.9). Electronically Signed   By: Franki Cabot M.D.   On: 03/27/2018 14:09   Dg Chest Port 1 View  Result Date: 03/27/2018 CLINICAL DATA:  Fall tonight. New onset altered mental status. Difficulty speaking has resolved. EXAM: PORTABLE CHEST 1 VIEW COMPARISON:  02/12/2018 FINDINGS: Postoperative changes in the mediastinum. Normal heart size and pulmonary vascularity. No focal airspace disease or consolidation in the lungs. No blunting of costophrenic angles. No pneumothorax. Mediastinal contours appear intact. IMPRESSION: No active disease. Electronically Signed   By: Lucienne Capers M.D.   On: 03/27/2018 03:28   Ct Head Code Stroke Wo Contrast  Result Date: 03/27/2018 CLINICAL DATA:  Code stroke. Unable to speak. Last seen normal at midnight. EXAM: CT HEAD WITHOUT CONTRAST TECHNIQUE: Contiguous axial images were obtained from the base of the skull through the vertex without intravenous contrast. COMPARISON:  Head CT 01/28/2018 FINDINGS: Brain: There is no mass, hemorrhage or  extra-axial collection. The size and configuration of the ventricles and extra-axial CSF spaces are normal. There are old bilateral basal ganglia lacunar infarcts. There is also an old right frontal infarct. There is hypoattenuation of the periventricular white matter, most commonly indicating chronic ischemic microangiopathy. Vascular: Atherosclerotic calcification of the internal carotid arteries at the skull base. No abnormal hyperdensity of the major intracranial arteries or dural venous sinuses. Skull: The visualized skull base, calvarium and extracranial soft tissues are normal. Sinuses/Orbits: No fluid levels or advanced mucosal thickening of the visualized  paranasal sinuses. No mastoid or middle ear effusion. The orbits are normal. ASPECTS [facility] Stroke Program Early CT Score) - Ganglionic level infarction (caudate, lentiform nuclei, internal capsule, insula, M1-M3 cortex): 7 - Supraganglionic infarction (M4-M6 cortex): 3 Total score (0-10 with 10 being normal): 10 IMPRESSION: 1. No acute hemorrhage or mass effect. 2. ASPECTS is 10. 3. Old right frontal and bilateral basal ganglia lacunar infarcts. These results were communicated to Dr. Roland Rack at 2:56 am on 03/27/2018 by text page via the [facility] messaging system. Electronically Signed   By: Ulyses Jarred M.D.   On: 03/27/2018 02:58    Microbiology: Recent Results (from the past 240 hour(s))  Aerobic Culture (superficial specimen)     Status: None   Collection Time: 03/27/18  3:32 PM  Result Value Ref Range Status   Specimen Description TRACHEAL SITE  Final   Special Requests NONE  Final   Gram Stain   Final    MODERATE WBC PRESENT, PREDOMINANTLY PMN FEW GRAM POSITIVE COCCI Performed at [facility], [address], [place], [place] [address]    Culture MODERATE STAPHYLOCOCCUS AUREUS  Final   Report Status 03/29/2018 FINAL  Final   Organism ID, Bacteria STAPHYLOCOCCUS AUREUS  Final      Susceptibility   Staphylococcus aureus - MIC*    CIPROFLOXACIN <=0.5 SENSITIVE Sensitive     ERYTHROMYCIN <=0.25 SENSITIVE Sensitive     GENTAMICIN <=0.5 SENSITIVE Sensitive     OXACILLIN <=0.25 SENSITIVE Sensitive     TETRACYCLINE <=1 SENSITIVE Sensitive     VANCOMYCIN <=0.5 SENSITIVE Sensitive     TRIMETH/SULFA <=10 SENSITIVE Sensitive     CLINDAMYCIN <=0.25 SENSITIVE Sensitive     RIFAMPIN <=0.5 SENSITIVE Sensitive     Inducible Clindamycin NEGATIVE Sensitive     * MODERATE STAPHYLOCOCCUS AUREUS  Urine Culture     Status: None   Collection Time: 03/27/18  4:37 PM  Result Value Ref Range Status   Specimen Description URINE, CATHETERIZED  Final   Special Requests NONE  Final    Culture   Final    NO GROWTH Performed at [facility], [place] [address] [place], [place] [address]    Report Status 03/28/2018 FINAL  Final     Labs: Basic Metabolic Panel: Recent Labs  Lab 03/27/18 0241 03/27/18 0247 03/27/18 0719 03/28/18 0359 03/29/18 0352  NA 135 134* 138 139 138  K 4.1 4.1 3.7 3.6 4.3  CL 103 102 105 107 106  CO2 22  --  25 26 25   GLUCOSE 102* 98 98 106* 90  BUN 16 17 12 11 12   CREATININE 0.97 1.00 0.85 0.95 0.99  CALCIUM 8.7*  --  8.6* 8.5* 8.8*   Liver Function Tests: Recent Labs  Lab 03/27/18 0241  AST 19  ALT 14  ALKPHOS 131*  BILITOT 0.5  PROT 6.2*  ALBUMIN 3.4*   No results for input(s): LIPASE, AMYLASE in the last 168 hours.  No results for input(s): AMMONIA in the last 168 hours. CBC: Recent Labs  Lab 03/27/18 0241 03/27/18 0247 03/27/18 0719 03/28/18 0359 03/29/18 0352  WBC 5.9  --  5.7 4.8 6.3  NEUTROABS 3.3  --   --  2.4 3.8  HGB 9.1* 9.2* 8.7* 8.8* 9.4*  HCT 30.1* 27.0* 28.1* 28.5* 31.3*  MCV 89.6  --  87.3 87.7 88.2  PLT 244  --  234 248 275   Cardiac Enzymes: No results for input(s): CKTOTAL, CKMB, CKMBINDEX, TROPONINI in the last 168 hours. BNP: BNP (last 3 results) No results for input(s): BNP in the last 8760 hours.  ProBNP (last 3 results) No results for input(s): PROBNP in the last 8760 hours.  CBG: Recent Labs  Lab 03/27/18 0242  GLUCAP 94       Signed:  Alma Friendly, MD [facility] 03/29/2018, 3:19 PM

## 2018-03-29 NOTE — Progress Notes (Signed)
Physical Therapy Treatment Patient Details Name: Danny Krause MRN: [id number] DOB: 13-Dec-1940 Today's Date: 03/29/2018    History of Present Illness Pt is a 77 y/o male admitted secondary to fall from episode of unresponsiveness. Pt also with AMS. CT negative for acute abnormality. CT of chest revealed trace L pleural effusion. Pt had EEG as well which was characterized by slowing. PMH includes COPD, HTN, CAD s/p CABG, CVA, and seizures.     PT Comments    Patient progressing well with mobility, no physical assist required for any tasks today. Supervision for line management but otherwise mobilizing well.    Follow Up Recommendations  Supervision/Assistance - 24 hour;Home health PT     Equipment Recommendations  None recommended by PT    Recommendations for Other Services       Precautions / Restrictions Precautions Precautions: Fall Restrictions Weight Bearing Restrictions: No    Mobility  Bed Mobility Overal bed mobility: Needs Assistance Bed Mobility: Supine to Sit     Supine to sit: Supervision     General bed mobility comments: supervision for safety with HOB elevated, no physical assist required   Transfers Overall transfer level: Needs assistance Equipment used: None Transfers: Sit to/from Stand Sit to Stand: Supervision         General transfer comment: supervision for safety, no physical assist required  Ambulation/Gait Ambulation/Gait assistance: Supervision Gait Distance (Feet): 810 Feet Assistive device: (rollator) Gait Pattern/deviations: Step-through pattern;Decreased stride length;Trunk flexed Gait velocity: Decreased    General Gait Details: Steady with ambulation good activity tolerance   Stairs             Wheelchair Mobility    Modified Rankin (Stroke Patients Only)       Balance Overall balance assessment: Needs assistance Sitting-balance support: No upper extremity supported;Feet supported Sitting balance-Leahy  Scale: Good Sitting balance - Comments: donning socks EOB with no LOB    Standing balance support: Single extremity supported;During functional activity Standing balance-Leahy Scale: Fair Standing balance comment: use of UE support for dynamic mobility                             Cognition Arousal/Alertness: Awake/alert Behavior During Therapy: WFL for tasks assessed/performed Overall Cognitive Status: No family/caregiver present to determine baseline cognitive functioning                                        Exercises      General Comments        Pertinent Vitals/Pain Pain Assessment: No/denies pain    Home Living                      Prior Function            PT Goals (current goals can now be found in the care plan section) Acute Rehab PT Goals Patient Stated Goal: wants to go home to his wife  PT Goal Formulation: With patient Time For Goal Achievement: 04/10/18 Potential to Achieve Goals: Good Progress towards PT goals: Progressing toward goals    Frequency    Min 3X/week      PT Plan Current plan remains appropriate    Co-evaluation              AM-PAC PT "6 Clicks" Daily Activity  Outcome Measure  Difficulty turning over in  bed (including adjusting bedclothes, sheets and blankets)?: A Little Difficulty moving from lying on back to sitting on the side of the bed? : A Little Difficulty sitting down on and standing up from a chair with arms (e.g., wheelchair, bedside commode, etc,.)?: A Little Help needed moving to and from a bed to chair (including a wheelchair)?: A Little Help needed walking in hospital room?: A Little Help needed climbing 3-5 steps with a railing? : A Lot 6 Click Score: 17    End of Session Equipment Utilized During Treatment: Gait belt Activity Tolerance: Patient tolerated treatment well Patient left: with call bell/phone within reach;in chair;with chair alarm set Nurse  Communication: Mobility status PT Visit Diagnosis: Unsteadiness on feet (R26.81);Other abnormalities of gait and mobility (R26.89);Muscle weakness (generalized) (M62.81)     Time: 1749-4496 PT Time Calculation (min) (ACUTE ONLY): 20 min  Charges:  $Gait Training: 8-22 mins                     Alben Deeds, PT DPT  Board Certified Neurologic Specialist [place] 03/29/2018, 10:06 AM

## 2018-03-29 NOTE — Progress Notes (Signed)
NEURO HOSPITALIST PROGRESS NOTE   Subjective: Patient awake alet, NAD on RA and out walking with PT. Patient states that he is ready to go home. No further unresponsive episodes overnight. No seizure like activity noted. No family at bedside,  Exam: Vitals:   03/29/18 0800 03/29/18 0915  BP: 119/70 113/68  Pulse: 66 69  Resp: 18 20  Temp:    SpO2: 95% 95%    Physical Exam   HEENT-  Normocephalic, no lesions, without obvious abnormality.  Normal external eye and conjunctiva.   Cardiovascular-pulses palpable throughout   Lungs- no excessive working breathing.  Saturations within normal limits on RA Extremities- Warm, dry and intact Musculoskeletal-no joint tenderness, deformity or swelling Skin-warm and dry,intact   Neuro:  Mental Status: Alert, oriented, to person/age/ month/place/ stated it was 2018.  Speech fluent without evidence of aphasia.  Able to follow  commands without difficulty. Note: patient is hard of hearing. Cranial Nerves: II:  Visual fields grossly normal,  III,IV, VI: ptosis not present, extra-ocular motions intact bilaterally pupils equal, round, reactive to light and accommodation V,VII: smile symmetric, facial light touch sensation normal bilaterally VIII: he is very hard of hearing IX,X: uvula rises symmetrically XI: bilateral shoulder shrug XII: midline tongue extension Motor: Right : Upper extremity   5/5    Left:     Upper extremity   5/5  Lower extremity   5/5     Lower extremity   5/5 Tone and bulk:normal tone throughout; no atrophy noted Sensory: Pinprick and light touch intact throughout, bilaterally Deep Tendon Reflexes: 2+ and symmetric biceps, patella Plantars: Right: downgoing   Left: downgoing Cerebellar: normal finger-to-nose,  normal heel-to-shin test Gait: normal gait and station    Medications:  Scheduled: . aspirin  325 mg Per Tube Daily  . atorvastatin  10 mg Oral Daily  . bethanechol  5 mg Oral TID  .  enoxaparin (LOVENOX) injection  40 mg Subcutaneous Daily  . famotidine  20 mg Oral BID  . ferrous sulfate  325 mg Oral BID WC  . hydrALAZINE  10 mg Oral TID  . ipratropium-albuterol  3 mL Nebulization BID  . lacosamide  200 mg Oral BID  . levETIRAcetam  1,500 mg Oral BID  . losartan  100 mg Oral Daily  . Melatonin  2 tablet Oral QHS  . multivitamin with minerals  1 tablet Oral Daily  . QUEtiapine  25 mg Oral Daily  . QUEtiapine  50 mg Oral QHS  . sertraline  50 mg Oral Daily  . topiramate  200 mg Oral BID   Continuous:  UKG:URKYHCWCBJSEG **OR** acetaminophen, albuterol, hydrALAZINE, LORazepam, ondansetron **OR** ondansetron (ZOFRAN) IV, senna-docusate, zolpidem  Pertinent Labs/Diagnostics:   Ct Chest Wo Contrast  Result Date: 03/27/2018 CLINICAL DATA:  COPD exacerbation, uncomplicated; Lung nodule, >=1cm Aspiration, known or suspected. EXAM: CT CHEST WITHOUT CONTRAST TECHNIQUE: Multidetector CT imaging of the chest was performed following the standard protocol without IV contrast. COMPARISON:  Chest x-ray dated 03/27/2018.  No prior chest CT. FINDINGS: Cardiovascular: Heart size is upper normal. No significant pericardial effusion. Aortic atherosclerosis. No thoracic aortic aneurysm. Diffuse coronary artery calcifications. Surgical changes of a presumed CABG. Mediastinum/Nodes: No mass or enlarged lymph nodes seen within the mediastinum or perihilar regions. Esophagus appears normal. Trachea and central bronchi are unremarkable. Lungs/Pleura: Emphysematous changes, at least moderate in degree, upper lobe predominant. Some portions  of the lungs are difficult to definitively characterize due to patient breathing motion artifact, however, there is no discrete pulmonary nodule. Minimal consolidations within the dependent portions of the lower lobes bilaterally, atelectasis more likely than aspiration. Minimal LEFT pleural effusion. No pneumothorax. Upper Abdomen: No acute abnormality.  Musculoskeletal: Mild degenerative spondylosis within the thoracic spine. No acute or suspicious osseous finding. IMPRESSION: 1. Emphysema, at least moderate in degree, upper lobe predominant. 2. No pulmonary nodule identified, with mild study limitations detailed above. 3. Minimal consolidations within the dependent portions of the lower lobes bilaterally, more likely mild atelectasis than aspiration. No evidence of pneumonia or pulmonary edema. 4. Trace LEFT pleural effusion. Aortic Atherosclerosis (ICD10-I70.0) and Emphysema (ICD10-J43.9). Electronically Signed   By: Franki Cabot M.D.   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  Dilantin level was found to be increased, and dilantin.  Dilantin toxicity d/t elevated phenytoin level. Stopped dilantin and re-start at lower dose when levels normalize.  Recommendations:   -- continue home doses of AEDs including: --Topamax 200 mg twice daily -- restart Dilantin @ 100 mg 3 times daily Keppra 1500 mg twice daily Lacosamide 200 mg twice daily -F/u with out patient Neurology --Neurology to sign off at this time. Please call with any further questions or concerns.     Laurey Morale, MSN, NP-C [facility] [phone]  Attending neurologist's note to follow    03/29/2018, 9:17 AM    NEUROHOSPITALIST ADDENDUM Seen and examined the patient today. I have reviewed the contents of history and physical exam as documented by PA/ARNP/Resident and agree with above documentation.  I have discussed and formulated the above plan as documented. Edits to the note have been made as needed.  Phenytoin level now 21.7- will restart at lower dose of 100mg  TID. No longer having asterixis. Can be discharged if cleared by PT.   Karena Addison Aroor MD [facility] [phone]   If 7pm to 7am, please call on call as listed on AMION.

## 2018-03-29 NOTE — Progress Notes (Signed)
Yesterday, CM had long discussion with estranged son that supposedly was providing assistance to his mother and the patient until 2 weeks ago when they had a disagreement. Then CM had a discussion with the patient's wife and son in law. (patient was present during both discussions)  Currently the patient and his wife are alone in the home but are active with [facility] for [facility] services. Patients daughter, [name] has been setting up the patients medications for him since Saturday with instructions when to take. Patient states his wife is who reminds him to take his medications.  When asked who is cooking, the patient states he is doing the food preparation. Pt states he is mostly independent in his care at home. Pt's wife is disabled and mostly in a wheelchair d/t BKA.  Pt and wife would like to continue with [facility] at d/c. CM will discuss situation with [facility] and ask that RN please do med checks and discuss process [name] is using for meds. CM will ask that CSW be added if not already in the home to assess further needs in the home. CM following.

## 2018-03-31 DIAGNOSIS — Z430 Encounter for attention to tracheostomy: Secondary | ICD-10-CM | POA: Diagnosis not present

## 2018-03-31 DIAGNOSIS — R1310 Dysphagia, unspecified: Secondary | ICD-10-CM | POA: Diagnosis not present

## 2018-03-31 DIAGNOSIS — J9611 Chronic respiratory failure with hypoxia: Secondary | ICD-10-CM | POA: Diagnosis not present

## 2018-03-31 DIAGNOSIS — E43 Unspecified severe protein-calorie malnutrition: Secondary | ICD-10-CM | POA: Diagnosis not present

## 2018-03-31 DIAGNOSIS — J449 Chronic obstructive pulmonary disease, unspecified: Secondary | ICD-10-CM | POA: Diagnosis not present

## 2018-03-31 DIAGNOSIS — M6281 Muscle weakness (generalized): Secondary | ICD-10-CM | POA: Diagnosis not present

## 2018-04-01 DIAGNOSIS — Z430 Encounter for attention to tracheostomy: Secondary | ICD-10-CM | POA: Diagnosis not present

## 2018-04-01 DIAGNOSIS — J9611 Chronic respiratory failure with hypoxia: Secondary | ICD-10-CM | POA: Diagnosis not present

## 2018-04-01 DIAGNOSIS — J449 Chronic obstructive pulmonary disease, unspecified: Secondary | ICD-10-CM | POA: Diagnosis not present

## 2018-04-01 DIAGNOSIS — E43 Unspecified severe protein-calorie malnutrition: Secondary | ICD-10-CM | POA: Diagnosis not present

## 2018-04-01 DIAGNOSIS — M6281 Muscle weakness (generalized): Secondary | ICD-10-CM | POA: Diagnosis not present

## 2018-04-01 DIAGNOSIS — R1310 Dysphagia, unspecified: Secondary | ICD-10-CM | POA: Diagnosis not present

## 2018-04-03 DIAGNOSIS — J9611 Chronic respiratory failure with hypoxia: Secondary | ICD-10-CM | POA: Diagnosis not present

## 2018-04-03 DIAGNOSIS — R1310 Dysphagia, unspecified: Secondary | ICD-10-CM | POA: Diagnosis not present

## 2018-04-03 DIAGNOSIS — E43 Unspecified severe protein-calorie malnutrition: Secondary | ICD-10-CM | POA: Diagnosis not present

## 2018-04-03 DIAGNOSIS — Z430 Encounter for attention to tracheostomy: Secondary | ICD-10-CM | POA: Diagnosis not present

## 2018-04-03 DIAGNOSIS — M6281 Muscle weakness (generalized): Secondary | ICD-10-CM | POA: Diagnosis not present

## 2018-04-03 DIAGNOSIS — J449 Chronic obstructive pulmonary disease, unspecified: Secondary | ICD-10-CM | POA: Diagnosis not present

## 2018-04-05 DIAGNOSIS — J9611 Chronic respiratory failure with hypoxia: Secondary | ICD-10-CM | POA: Diagnosis not present

## 2018-04-05 DIAGNOSIS — J449 Chronic obstructive pulmonary disease, unspecified: Secondary | ICD-10-CM | POA: Diagnosis not present

## 2018-04-05 DIAGNOSIS — R1310 Dysphagia, unspecified: Secondary | ICD-10-CM | POA: Diagnosis not present

## 2018-04-05 DIAGNOSIS — E43 Unspecified severe protein-calorie malnutrition: Secondary | ICD-10-CM | POA: Diagnosis not present

## 2018-04-05 DIAGNOSIS — Z430 Encounter for attention to tracheostomy: Secondary | ICD-10-CM | POA: Diagnosis not present

## 2018-04-05 DIAGNOSIS — M6281 Muscle weakness (generalized): Secondary | ICD-10-CM | POA: Diagnosis not present

## 2018-04-09 DIAGNOSIS — J9611 Chronic respiratory failure with hypoxia: Secondary | ICD-10-CM | POA: Diagnosis not present

## 2018-04-09 DIAGNOSIS — M6281 Muscle weakness (generalized): Secondary | ICD-10-CM | POA: Diagnosis not present

## 2018-04-09 DIAGNOSIS — J449 Chronic obstructive pulmonary disease, unspecified: Secondary | ICD-10-CM | POA: Diagnosis not present

## 2018-04-09 DIAGNOSIS — Z430 Encounter for attention to tracheostomy: Secondary | ICD-10-CM | POA: Diagnosis not present

## 2018-04-09 DIAGNOSIS — E43 Unspecified severe protein-calorie malnutrition: Secondary | ICD-10-CM | POA: Diagnosis not present

## 2018-04-09 DIAGNOSIS — R1310 Dysphagia, unspecified: Secondary | ICD-10-CM | POA: Diagnosis not present

## 2018-04-10 ENCOUNTER — Encounter: Payer: Self-pay | Admitting: Internal Medicine

## 2018-04-10 ENCOUNTER — Ambulatory Visit (INDEPENDENT_AMBULATORY_CARE_PROVIDER_SITE_OTHER): Payer: Medicare Other | Admitting: Internal Medicine

## 2018-04-10 ENCOUNTER — Ambulatory Visit: Payer: Self-pay | Admitting: Neurology

## 2018-04-10 VITALS — BP 124/86 | HR 75 | Ht 67.0 in | Wt 139.2 lb

## 2018-04-10 DIAGNOSIS — Z8673 Personal history of transient ischemic attack (TIA), and cerebral infarction without residual deficits: Secondary | ICD-10-CM | POA: Diagnosis not present

## 2018-04-10 DIAGNOSIS — E782 Mixed hyperlipidemia: Secondary | ICD-10-CM

## 2018-04-10 DIAGNOSIS — R569 Unspecified convulsions: Secondary | ICD-10-CM | POA: Diagnosis not present

## 2018-04-10 DIAGNOSIS — Z430 Encounter for attention to tracheostomy: Secondary | ICD-10-CM | POA: Diagnosis not present

## 2018-04-10 DIAGNOSIS — Z951 Presence of aortocoronary bypass graft: Secondary | ICD-10-CM | POA: Diagnosis not present

## 2018-04-10 DIAGNOSIS — J449 Chronic obstructive pulmonary disease, unspecified: Secondary | ICD-10-CM | POA: Diagnosis not present

## 2018-04-10 DIAGNOSIS — Z952 Presence of prosthetic heart valve: Secondary | ICD-10-CM

## 2018-04-10 DIAGNOSIS — M6281 Muscle weakness (generalized): Secondary | ICD-10-CM | POA: Diagnosis not present

## 2018-04-10 DIAGNOSIS — J9611 Chronic respiratory failure with hypoxia: Secondary | ICD-10-CM | POA: Diagnosis not present

## 2018-04-10 DIAGNOSIS — E43 Unspecified severe protein-calorie malnutrition: Secondary | ICD-10-CM | POA: Diagnosis not present

## 2018-04-10 DIAGNOSIS — R1310 Dysphagia, unspecified: Secondary | ICD-10-CM | POA: Diagnosis not present

## 2018-04-10 LAB — LIPID PANEL
CHOLESTEROL TOTAL: 166 mg/dL (ref 100–199)
Chol/HDL Ratio: 3.3 ratio (ref 0.0–5.0)
HDL: 50 mg/dL (ref >39)
LDL Calculated: 90 mg/dL (ref 0–99)
Triglycerides: 131 mg/dL (ref 0–149)
VLDL CHOLESTEROL CAL: 26 mg/dL (ref 5–40)

## 2018-04-10 MED ORDER — ATORVASTATIN CALCIUM 10 MG PO TABS: 10.0000 mg | ORAL_TABLET | Freq: Every day | ORAL | 3 refills | Status: DC

## 2018-04-10 MED ORDER — LOSARTAN POTASSIUM 100 MG PO TABS: 100.0000 mg | ORAL_TABLET | Freq: Every day | ORAL | 3 refills | Status: DC

## 2018-04-10 MED ORDER — ASPIRIN 325 MG PO TABS: 325.0000 mg | ORAL_TABLET | Freq: Every day | ORAL | 3 refills | Status: AC

## 2018-04-10 MED ORDER — HYDRALAZINE HCL 10 MG PO TABS: 10.0000 mg | ORAL_TABLET | Freq: Three times a day (TID) | ORAL | 3 refills | Status: DC

## 2018-04-10 NOTE — Patient Instructions (Addendum)
Your physician recommends that you return for lab work FASTING to check cholesterol   Your physician wants you to follow-up in: 6 months with Dr. Hilty. You will receive a reminder letter in the mail two months in advance. If you don't receive a letter, please call our office to schedule the follow-up appointment.  

## 2018-04-10 NOTE — Progress Notes (Signed)
OFFICE CONSULT NOTE  Chief Complaint: Follow-up hospitalizations  Primary Care Physician: Jani Gravel, MD  HPI:  Danny Krause is a 77 y.o. male who is being seen today for the evaluation of murmur at the request of Dr. Jani Gravel. Danny Krause is a pleasant 77 yo male whose wife is a patient of mine. Recently had a syncopal episode. He was doing some work outside in [employer]. He apparently passed out although this was unwitnessed. He was found down and thought possibly had a stroke. When EMS arrived they were planning to take him to the hospital and then he had a seizure. He apparently had a status post episode and eventually was broken of that seizure. He was placed on Keppra and has a follow-up with the neurologist next month. He denies any chest pain but does get short of breath. He is a 50 pack year smoker and continues to smoke. He reports when he was in his 47s he had workup for chest pain which included an evaluation that demonstrated a heart murmur. He was told that he should take antibiotics prior to dental visits but nothing more was made out of it. He did not follow-up with a doctor since then. He's not a fan of doctors, although we have significantly helped his wife.  06/01/2017  Danny Krause returns today for follow-up. He reports his wife is actually back in the hospital now. He seems to be under a lot of stress. Unfortunately the echo demonstrated severe aortic stenosis. This likely the cause of his syncopal episode and seizure. Mean gradient across the aortic valve is 87 mmHg with a calculated aortic valve area 0.4 cm. LVEF is 55-60% with normal wall motion and grade 1 diastolic dysfunction. I discussed this with him today and he seemed quite upset with the news. He understands that he will need aortic valve surgery. Given his lack of other comorbidities, I think EF she would be a good candidate for traditional aortic valve replacement, likely with a bioprosthetic valve.  He will need a heart catheterization prior to this. Right now since his wife's in the hospital, he wants to wait to schedule it until next week.  09/21/2017  Danny Krause returns today for hospital follow-up.  He reports worsening shortness of breath.  He has recently had a productive cough and rattle.  He has a history of smoking and recently stopped with his surgery.  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  He is on no therapy for this.  He is not had follow-up with his primary care provider.  He seems somewhat upset with inability to make an appointment with the office and is requesting names of other primary care providers for me today.  He denies any wheezing but gets short of breath with exertion and has a nonproductive cough.  He denies any significant weight gain, orthopnea or lower extremity edema.  He had an uneventful cardiac surgery and underwent aortic valve replacement and three-vessel coronary artery bypass grafting by Dr. Servando Snare on 07/04/2017, with placement of a 23 mm [employer] pericardial tissue valve and LIMA to the LAD, SVG to the diagonal and SVG to the distal right coronary.  He denies any anginal symptoms.  Postoperative echocardiography demonstrated normal valve function.  Danny Krause is significantly anxious and says he is not sleeping well at night.  His wife is been ill and is back in the hospital again with a leg infection.  He's concerned that she may need amputation.  12/26/2017  Danny Krause returns today for hospital follow-up.  Unfortunately recently had an episode of seizure.  This was likely due to hypercarbic respiratory failure.  His PCO2 was apparently over 100 and took several days to normalize.  He was intubated and eventually weaned off of the ventilator.  He had seizure activity as well and his Keppra has been increased.  He has a follow-up with pulmonary to see Dr. Lake Bells in May.  From a  cardiac standpoint he is been very stable after placement of his pericardial tissue valve and multivessel bypass.  He denies any recurrent chest pain.  Blood pressure is elevated today however at 177/85.  His son says his blood pressures been persistently elevated over the past several weeks.  04/10/2018  Danny Krause returns today for follow-up.  Unfortunately is been hospitalized several times since I last saw him.  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  He is currently on 4 antiepileptic medications.  The focus of his seizure seems to be from his stroke which he suffered in conjunction with severe aortic stenosis.  He subsequently undergone bioprosthetic AVR and coronary artery bypass grafting in 06/2017, which has been stable.  He has no complaints of chest pain or shortness of breath.  Blood pressure initially was elevated today 169/82 but came down to 124/86 and a recheck.  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  He is not had a recent lipid profile and is on low-dose atorvastatin 10 mg.  PMHx:  Past Medical History:  Diagnosis Date  . Anginal pain ([place])    with exertion  . Coronary artery disease   . Critical aortic valve stenosis   . Dyspnea    with exertion  . 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 Murmur   . Seizure ([place])   . Stroke ([place])   . Syncope     Past Surgical History:  Procedure Laterality Date  . AORTIC VALVE REPLACEMENT N/A 07/04/2017   Procedure: AORTIC VALVE REPLACEMENT (AVR);  Surgeon: Grace Isaac, MD;  Location: [place];  Service: Open Heart Surgery;  Laterality: N/A;  . CORONARY ARTERY BYPASS GRAFT N/A 07/04/2017   Procedure: CORONARY ARTERY BYPASS GRAFTING (CABG) x three, using left internal mammary artery and right leg greater saphenous vein harvested endoscopically;  Surgeon: Grace Isaac, MD;  Location: [place];  Service: Open Heart Surgery;  Laterality: N/A;  . NO PAST  SURGERIES    . RIGHT/LEFT HEART CATH AND CORONARY ANGIOGRAPHY N/A 06/25/2017   Procedure: RIGHT/LEFT HEART CATH AND CORONARY ANGIOGRAPHY;  Surgeon: Troy Sine, MD;  Location: [place] [facility];  Service: Cardiovascular;  Laterality: N/A;  . TEE WITHOUT CARDIOVERSION N/A 07/04/2017   Procedure: TRANSESOPHAGEAL ECHOCARDIOGRAM (TEE);  Surgeon: Grace Isaac, MD;  Location: [place];  Service: Open Heart Surgery;  Laterality: N/A;  . THORACIC AORTOGRAM  06/25/2017   Procedure: THORACIC AORTOGRAM;  Surgeon: Troy Sine, MD;  Location: [place] [facility];  Service: Cardiovascular;;  aortic root   . TRACHEOSTOMY CLOSURE  02/2018    FAMHx:  Family History  Problem Relation Age of Onset  . Hernia Father   . Stroke Brother     SOCHx:   reports that he quit smoking about 9 months ago. His smoking use included cigarettes. He has a 128.00 pack-year smoking history. He has never used smokeless tobacco. He reports that  he does not drink alcohol or use drugs.  ALLERGIES:  No Known Allergies  ROS: Pertinent items noted in HPI and remainder of comprehensive ROS otherwise negative.  HOME MEDS: Current Outpatient Medications on File Prior to Visit  Medication Sig Dispense Refill  . acetaminophen (TYLENOL) 325 MG tablet Take 325 mg by mouth every 6 (six) hours as needed for mild pain, fever or headache.     Marland Kitchen aspirin 325 MG tablet Place 1 tablet (325 mg total) into feeding tube daily. 30 tablet 0  . atorvastatin (LIPITOR) 10 MG tablet Take 1 tablet (10 mg total) by mouth daily. 30 tablet 0  . bethanechol (URECHOLINE) 5 MG tablet Take 1 tablet (5 mg total) by mouth 3 (three) times daily. 90 tablet 0  . famotidine (PEPCID) 20 MG tablet Take 1 tablet (20 mg total) by mouth 2 (two) times daily. 60 tablet 0  . ferrous sulfate 325 (65 FE) MG tablet Take 1 tablet (325 mg total) by mouth daily with breakfast. 30 tablet 0  . hydrALAZINE (APRESOLINE) 10 MG tablet Take 1 tablet (10 mg total) by mouth  3 (three) times daily. 90 tablet 0  . lacosamide (VIMPAT) 200 MG TABS tablet Take 1 tablet (200 mg total) by mouth 2 (two) times daily. 60 tablet 0  . levETIRAcetam (KEPPRA) 500 MG tablet Take 1,500 mg by mouth 2 (two) times daily.     Marland Kitchen losartan (COZAAR) 100 MG tablet Take 1 tablet (100 mg total) by mouth daily. 30 tablet 0  . Melatonin 3 MG TABS Take 2 tablets (6 mg total) by mouth at bedtime. 60 tablet 0  . Multiple Vitamins-Minerals (MULTIVITAMINS THER. W/MINERALS) TABS tablet Take 1 tablet by mouth daily. 30 each 0  . phenytoin (DILANTIN) 100 MG ER capsule Take 1 capsule (100 mg total) by mouth 3 (three) times daily. 90 capsule 0  . QUEtiapine (SEROQUEL) 25 MG tablet Take 1 tablet (25 mg total) by mouth See admin instructions. 25mg  in the am and 50mg  in the pm 60 tablet 0  . senna-docusate (SENOKOT-S) 8.6-50 MG tablet Take 1 tablet by mouth at bedtime. 30 tablet 0  . sertraline (ZOLOFT) 50 MG tablet Take 1 tablet (50 mg total) by mouth daily. 30 tablet 0  . topiramate (TOPAMAX) 200 MG tablet Take 1 tablet (200 mg total) by mouth 2 (two) times daily. 60 tablet 0   No current facility-administered medications on file prior to visit.     LABS/IMAGING: No results found for this or any previous visit (from the past 48 hour(s)). No results found.  LIPID PANEL:    Component Value Date/Time   CHOL 196 03/13/2017 2338   TRIG 183 (H) 02/07/2018 0351   HDL 47 03/13/2017 2338   CHOLHDL 4.2 03/13/2017 2338   VLDL 15 03/13/2017 2338   LDLCALC 134 (H) 03/13/2017 2338    WEIGHTS: Wt Readings from Last 3 Encounters:  04/10/18 139 lb 3.2 oz (63.1 kg)  03/27/18 160 lb 0.9 oz (72.6 kg)  01/31/18 147 lb 11.3 oz (67 kg)    VITALS: BP (!) 169/82   Pulse 75   Ht 5\' 7"  (1.702 m)   Wt 139 lb 3.2 oz (63.1 kg)   SpO2 94%   BMI 21.80 kg/m   EXAM: General appearance: alert and no distress Neck: no carotid bruit, no JVD and thyroid not enlarged, symmetric, no tenderness/mass/nodules Lungs:  diminished breath sounds bilaterally Heart: regular rate and rhythm Abdomen: soft, non-tender; bowel sounds normal; no masses,  no organomegaly Extremities: extremities normal, atraumatic, no cyanosis or edema Pulses: 2+ and symmetric Skin: Skin color, texture, turgor normal. No rashes or lesions Neurologic: Grossly normal Psych: Anxious  EKG: Deferred  ASSESSMENT: 1. Aortic stenosis status post 23 mm [employer] pericardial tissue valve (07/2017) 2. Coronary artery disease status post three-vessel CABG with LIMA to LAD, SVG to diagonal, SVG to RCA (07/2017) 3. Dyspnea-likely secondary to COPD 4. Abnormal EKG suggesting pulmonary disease pattern 5. Recent syncopal episode 6. Seizures 7. Hypercarbic respiratory failure 8. Hypertension  PLAN: 1.   Danny Krause seems to be stable from a cardiac standpoint although is having issues with recurrent seizures and had confusion related to elevated valproic acid levels.  He will need reassessment of his pericardial tissue valve by echo in 6 to 12 months.  Blood pressures been stable.  No changes were made to his medications today.  We will repeat a lipid profile.  Follow-up with me in 6 months.  Pixie Casino, MD, [facility], [place] Director of the [facility] Lipid Disorders &  Cardiovascular Risk Reduction Clinic Diplomate of the American Board of Clinical Lipidology Attending Cardiologist  Direct Dial: [phone]  Fax: [phone]  Website:  [email or web address] 04/10/2018, 8:43 AM

## 2018-04-11 ENCOUNTER — Encounter: Payer: Self-pay | Admitting: Internal Medicine

## 2018-04-11 ENCOUNTER — Telehealth: Payer: Self-pay | Admitting: Internal Medicine

## 2018-04-11 DIAGNOSIS — J449 Chronic obstructive pulmonary disease, unspecified: Secondary | ICD-10-CM | POA: Diagnosis not present

## 2018-04-11 DIAGNOSIS — E782 Mixed hyperlipidemia: Secondary | ICD-10-CM

## 2018-04-11 DIAGNOSIS — E785 Hyperlipidemia, unspecified: Secondary | ICD-10-CM

## 2018-04-11 DIAGNOSIS — E43 Unspecified severe protein-calorie malnutrition: Secondary | ICD-10-CM | POA: Diagnosis not present

## 2018-04-11 DIAGNOSIS — J9611 Chronic respiratory failure with hypoxia: Secondary | ICD-10-CM | POA: Diagnosis not present

## 2018-04-11 DIAGNOSIS — M6281 Muscle weakness (generalized): Secondary | ICD-10-CM | POA: Diagnosis not present

## 2018-04-11 DIAGNOSIS — R1310 Dysphagia, unspecified: Secondary | ICD-10-CM | POA: Diagnosis not present

## 2018-04-11 DIAGNOSIS — Z430 Encounter for attention to tracheostomy: Secondary | ICD-10-CM | POA: Diagnosis not present

## 2018-04-11 MED ORDER — ATORVASTATIN CALCIUM 40 MG PO TABS: 40.0000 mg | ORAL_TABLET | Freq: Every day | ORAL | 3 refills | Status: DC

## 2018-04-11 NOTE — Telephone Encounter (Signed)
Notified patient/wife of results. Agrees w/MD recommendations. Rx(s) sent to pharmacy electronically. Lab order and copy of labs mailed

## 2018-04-11 NOTE — Telephone Encounter (Signed)
-----   Message from Pixie Casino, MD sent at 04/10/2018  6:46 PM EDT ----- Lipid profile improved - LDL above goal <70. Recommend increase atorvastatin to 40 mg daily. Repeat lipid profile in 3 months.  Dr. Lemmie Evens

## 2018-04-12 DIAGNOSIS — Z430 Encounter for attention to tracheostomy: Secondary | ICD-10-CM | POA: Diagnosis not present

## 2018-04-12 DIAGNOSIS — J9611 Chronic respiratory failure with hypoxia: Secondary | ICD-10-CM | POA: Diagnosis not present

## 2018-04-12 DIAGNOSIS — J449 Chronic obstructive pulmonary disease, unspecified: Secondary | ICD-10-CM | POA: Diagnosis not present

## 2018-04-12 DIAGNOSIS — E43 Unspecified severe protein-calorie malnutrition: Secondary | ICD-10-CM | POA: Diagnosis not present

## 2018-04-12 DIAGNOSIS — M6281 Muscle weakness (generalized): Secondary | ICD-10-CM | POA: Diagnosis not present

## 2018-04-12 DIAGNOSIS — R1310 Dysphagia, unspecified: Secondary | ICD-10-CM | POA: Diagnosis not present

## 2018-04-15 ENCOUNTER — Telehealth: Payer: Self-pay | Admitting: Neurology

## 2018-04-15 ENCOUNTER — Ambulatory Visit: Payer: Medicare Other | Admitting: Nurse Practitioner

## 2018-04-15 DIAGNOSIS — M6281 Muscle weakness (generalized): Secondary | ICD-10-CM | POA: Diagnosis not present

## 2018-04-15 DIAGNOSIS — J9611 Chronic respiratory failure with hypoxia: Secondary | ICD-10-CM | POA: Diagnosis not present

## 2018-04-15 DIAGNOSIS — Z430 Encounter for attention to tracheostomy: Secondary | ICD-10-CM | POA: Diagnosis not present

## 2018-04-15 DIAGNOSIS — E43 Unspecified severe protein-calorie malnutrition: Secondary | ICD-10-CM | POA: Diagnosis not present

## 2018-04-15 DIAGNOSIS — R1310 Dysphagia, unspecified: Secondary | ICD-10-CM | POA: Diagnosis not present

## 2018-04-15 DIAGNOSIS — J449 Chronic obstructive pulmonary disease, unspecified: Secondary | ICD-10-CM | POA: Diagnosis not present

## 2018-04-15 MED ORDER — LEVETIRACETAM 750 MG PO TABS: 1500.0000 mg | ORAL_TABLET | Freq: Two times a day (BID) | ORAL | 5 refills | Status: DC

## 2018-04-15 NOTE — Telephone Encounter (Signed)
The prescription for Keppra was sent in.

## 2018-04-15 NOTE — Telephone Encounter (Signed)
Pts daughter [name] called stating the pts pharmacy shut down and is needing a r/f for levETIRAcetam (KEPPRA) 500 MG tablet she to [facility] on [address]

## 2018-04-16 DIAGNOSIS — J449 Chronic obstructive pulmonary disease, unspecified: Secondary | ICD-10-CM | POA: Diagnosis not present

## 2018-04-16 DIAGNOSIS — R1310 Dysphagia, unspecified: Secondary | ICD-10-CM | POA: Diagnosis not present

## 2018-04-16 DIAGNOSIS — J9611 Chronic respiratory failure with hypoxia: Secondary | ICD-10-CM | POA: Diagnosis not present

## 2018-04-16 DIAGNOSIS — E43 Unspecified severe protein-calorie malnutrition: Secondary | ICD-10-CM | POA: Diagnosis not present

## 2018-04-16 DIAGNOSIS — M6281 Muscle weakness (generalized): Secondary | ICD-10-CM | POA: Diagnosis not present

## 2018-04-16 DIAGNOSIS — Z430 Encounter for attention to tracheostomy: Secondary | ICD-10-CM | POA: Diagnosis not present

## 2018-04-17 DIAGNOSIS — E43 Unspecified severe protein-calorie malnutrition: Secondary | ICD-10-CM | POA: Diagnosis not present

## 2018-04-17 DIAGNOSIS — Z430 Encounter for attention to tracheostomy: Secondary | ICD-10-CM | POA: Diagnosis not present

## 2018-04-17 DIAGNOSIS — M6281 Muscle weakness (generalized): Secondary | ICD-10-CM | POA: Diagnosis not present

## 2018-04-17 DIAGNOSIS — R1310 Dysphagia, unspecified: Secondary | ICD-10-CM | POA: Diagnosis not present

## 2018-04-17 DIAGNOSIS — J9611 Chronic respiratory failure with hypoxia: Secondary | ICD-10-CM | POA: Diagnosis not present

## 2018-04-17 DIAGNOSIS — J449 Chronic obstructive pulmonary disease, unspecified: Secondary | ICD-10-CM | POA: Diagnosis not present

## 2018-04-19 ENCOUNTER — Ambulatory Visit: Payer: Self-pay | Admitting: Neurology

## 2018-04-19 DIAGNOSIS — E43 Unspecified severe protein-calorie malnutrition: Secondary | ICD-10-CM | POA: Diagnosis not present

## 2018-04-19 DIAGNOSIS — J449 Chronic obstructive pulmonary disease, unspecified: Secondary | ICD-10-CM | POA: Diagnosis not present

## 2018-04-19 DIAGNOSIS — J9611 Chronic respiratory failure with hypoxia: Secondary | ICD-10-CM | POA: Diagnosis not present

## 2018-04-19 DIAGNOSIS — Z430 Encounter for attention to tracheostomy: Secondary | ICD-10-CM | POA: Diagnosis not present

## 2018-04-19 DIAGNOSIS — R1310 Dysphagia, unspecified: Secondary | ICD-10-CM | POA: Diagnosis not present

## 2018-04-19 DIAGNOSIS — M6281 Muscle weakness (generalized): Secondary | ICD-10-CM | POA: Diagnosis not present

## 2018-04-23 DIAGNOSIS — J9611 Chronic respiratory failure with hypoxia: Secondary | ICD-10-CM | POA: Diagnosis not present

## 2018-04-23 DIAGNOSIS — J449 Chronic obstructive pulmonary disease, unspecified: Secondary | ICD-10-CM | POA: Diagnosis not present

## 2018-04-23 DIAGNOSIS — M6281 Muscle weakness (generalized): Secondary | ICD-10-CM | POA: Diagnosis not present

## 2018-04-23 DIAGNOSIS — Z430 Encounter for attention to tracheostomy: Secondary | ICD-10-CM | POA: Diagnosis not present

## 2018-04-23 DIAGNOSIS — R1310 Dysphagia, unspecified: Secondary | ICD-10-CM | POA: Diagnosis not present

## 2018-04-23 DIAGNOSIS — E43 Unspecified severe protein-calorie malnutrition: Secondary | ICD-10-CM | POA: Diagnosis not present

## 2018-04-24 ENCOUNTER — Other Ambulatory Visit: Payer: Self-pay | Admitting: Neurology

## 2018-04-24 DIAGNOSIS — R1310 Dysphagia, unspecified: Secondary | ICD-10-CM | POA: Diagnosis not present

## 2018-04-24 DIAGNOSIS — J9611 Chronic respiratory failure with hypoxia: Secondary | ICD-10-CM | POA: Diagnosis not present

## 2018-04-24 DIAGNOSIS — M6281 Muscle weakness (generalized): Secondary | ICD-10-CM | POA: Diagnosis not present

## 2018-04-24 DIAGNOSIS — J449 Chronic obstructive pulmonary disease, unspecified: Secondary | ICD-10-CM | POA: Diagnosis not present

## 2018-04-24 DIAGNOSIS — Z430 Encounter for attention to tracheostomy: Secondary | ICD-10-CM | POA: Diagnosis not present

## 2018-04-24 DIAGNOSIS — E43 Unspecified severe protein-calorie malnutrition: Secondary | ICD-10-CM | POA: Diagnosis not present

## 2018-04-24 MED ORDER — TOPIRAMATE 200 MG PO TABS: 200.0000 mg | ORAL_TABLET | Freq: Two times a day (BID) | ORAL | 3 refills | Status: DC

## 2018-04-24 MED ORDER — LACOSAMIDE 200 MG PO TABS: 200.0000 mg | ORAL_TABLET | Freq: Two times a day (BID) | ORAL | 3 refills | Status: DC

## 2018-04-24 MED ORDER — PHENYTOIN SODIUM EXTENDED 100 MG PO CAPS: 100.0000 mg | ORAL_CAPSULE | Freq: Three times a day (TID) | ORAL | 3 refills | Status: DC

## 2018-04-24 NOTE — Addendum Note (Signed)
Addended by: Hope Pigeon on: 04/24/2018 09:48 AM   Modules accepted: Orders

## 2018-04-24 NOTE — Telephone Encounter (Signed)
Pt daughter(on DPR-[place] @[phone]) is asking for a refill on topiramate (TOPAMAX) 200 MG tablet;phenytoin (DILANTIN) 100 MG ER capsule & lacosamide (VIMPAT) 200 MG TABS tablet   [id number] [place], [place] - [address] AT [address] [place] [phone] (Phone) [phone] (Fax)

## 2018-04-25 DIAGNOSIS — J449 Chronic obstructive pulmonary disease, unspecified: Secondary | ICD-10-CM | POA: Diagnosis not present

## 2018-04-25 DIAGNOSIS — Z430 Encounter for attention to tracheostomy: Secondary | ICD-10-CM | POA: Diagnosis not present

## 2018-04-25 DIAGNOSIS — E43 Unspecified severe protein-calorie malnutrition: Secondary | ICD-10-CM | POA: Diagnosis not present

## 2018-04-25 DIAGNOSIS — M6281 Muscle weakness (generalized): Secondary | ICD-10-CM | POA: Diagnosis not present

## 2018-04-25 DIAGNOSIS — J9611 Chronic respiratory failure with hypoxia: Secondary | ICD-10-CM | POA: Diagnosis not present

## 2018-04-25 DIAGNOSIS — R1310 Dysphagia, unspecified: Secondary | ICD-10-CM | POA: Diagnosis not present

## 2018-04-26 DIAGNOSIS — E43 Unspecified severe protein-calorie malnutrition: Secondary | ICD-10-CM | POA: Diagnosis not present

## 2018-04-26 DIAGNOSIS — R1310 Dysphagia, unspecified: Secondary | ICD-10-CM | POA: Diagnosis not present

## 2018-04-26 DIAGNOSIS — M6281 Muscle weakness (generalized): Secondary | ICD-10-CM | POA: Diagnosis not present

## 2018-04-26 DIAGNOSIS — J449 Chronic obstructive pulmonary disease, unspecified: Secondary | ICD-10-CM | POA: Diagnosis not present

## 2018-04-26 DIAGNOSIS — Z430 Encounter for attention to tracheostomy: Secondary | ICD-10-CM | POA: Diagnosis not present

## 2018-04-26 DIAGNOSIS — J9611 Chronic respiratory failure with hypoxia: Secondary | ICD-10-CM | POA: Diagnosis not present

## 2018-04-30 DIAGNOSIS — J9611 Chronic respiratory failure with hypoxia: Secondary | ICD-10-CM | POA: Diagnosis not present

## 2018-04-30 DIAGNOSIS — E43 Unspecified severe protein-calorie malnutrition: Secondary | ICD-10-CM | POA: Diagnosis not present

## 2018-04-30 DIAGNOSIS — M6281 Muscle weakness (generalized): Secondary | ICD-10-CM | POA: Diagnosis not present

## 2018-04-30 DIAGNOSIS — J449 Chronic obstructive pulmonary disease, unspecified: Secondary | ICD-10-CM | POA: Diagnosis not present

## 2018-04-30 DIAGNOSIS — R1310 Dysphagia, unspecified: Secondary | ICD-10-CM | POA: Diagnosis not present

## 2018-04-30 DIAGNOSIS — Z430 Encounter for attention to tracheostomy: Secondary | ICD-10-CM | POA: Diagnosis not present

## 2018-05-02 ENCOUNTER — Encounter: Payer: Self-pay | Admitting: Neurology

## 2018-05-02 ENCOUNTER — Ambulatory Visit (INDEPENDENT_AMBULATORY_CARE_PROVIDER_SITE_OTHER): Payer: Medicare Other | Admitting: Neurology

## 2018-05-02 ENCOUNTER — Other Ambulatory Visit: Payer: Self-pay

## 2018-05-02 VITALS — BP 130/58 | HR 75 | Ht 67.0 in | Wt 138.0 lb

## 2018-05-02 DIAGNOSIS — R569 Unspecified convulsions: Secondary | ICD-10-CM | POA: Diagnosis not present

## 2018-05-02 DIAGNOSIS — Z5181 Encounter for therapeutic drug level monitoring: Secondary | ICD-10-CM

## 2018-05-02 NOTE — Patient Instructions (Signed)
Reduce the Vimpat 200 mg tablet by 1/2 tablet every 3 weeks until off the medication, then call our office.  Stop the Seroquel.

## 2018-05-02 NOTE — Progress Notes (Signed)
Reason for visit: Seizures  Danny Krause is an 77 y.o. male  History of present illness:  Danny Krause is a 77 year old right-handed white male with a history of seizures.  The patient had been doing quite well on Keppra, he went off the medication and into the hospital on 24 Jan 2018 with seizures and left-sided weakness.  The patient was found to be in status epilepticus with a right hemispheric focus.  The patient was placed on a multitude of seizure medications including Dilantin, Vimpat, Keppra, and Topamax.  The patient subsequently became Dilantin toxic on a 600 mg daily dose, he was admitted to the hospital with a delirium state on 27 March 2018.  Dilantin level at that time was 32.7.  The patient was lowered on the Dilantin dose to 300 mg daily.  He has returned to the office today for an evaluation.  He is having a lot of side effects on his medications including having bad dreams, tremors of the jaw, slurred speech, balance issues, and decreased cognitive functioning.  The patient is also on Seroquel and Zoloft which were added when he was in the hospital.  The patient clearly is more forgetful than usual.  He comes to the office today for an evaluation.  Past Medical History:  Diagnosis Date  . Anginal pain ([place])    with exertion  . Coronary artery disease   . Critical aortic valve stenosis   . Dyspnea    with exertion  . 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 Murmur   . Seizure ([place])   . Stroke ([place])   . Syncope     Past Surgical History:  Procedure Laterality Date  . AORTIC VALVE REPLACEMENT N/A 07/04/2017   Procedure: AORTIC VALVE REPLACEMENT (AVR);  Surgeon: Grace Isaac, MD;  Location: [facility];  Service: Open Heart Surgery;  Laterality: N/A;  . CORONARY ARTERY BYPASS GRAFT N/A 07/04/2017   Procedure: CORONARY ARTERY BYPASS GRAFTING (CABG) x three, using left internal mammary artery and right leg greater saphenous vein harvested endoscopically;  Surgeon:  Grace Isaac, MD;  Location: [facility];  Service: Open Heart Surgery;  Laterality: N/A;  . NO PAST SURGERIES    . RIGHT/LEFT HEART CATH AND CORONARY ANGIOGRAPHY N/A 06/25/2017   Procedure: RIGHT/LEFT HEART CATH AND CORONARY ANGIOGRAPHY;  Surgeon: Troy Sine, MD;  Location: [facility];  Service: Cardiovascular;  Laterality: N/A;  . TEE WITHOUT CARDIOVERSION N/A 07/04/2017   Procedure: TRANSESOPHAGEAL ECHOCARDIOGRAM (TEE);  Surgeon: Grace Isaac, MD;  Location: [facility];  Service: Open Heart Surgery;  Laterality: N/A;  . THORACIC AORTOGRAM  06/25/2017   Procedure: THORACIC AORTOGRAM;  Surgeon: Troy Sine, MD;  Location: [facility];  Service: Cardiovascular;;  aortic root   . TRACHEOSTOMY CLOSURE  02/2018    Family History  Problem Relation Age of Onset  . Hernia Father   . Stroke Brother     Social history:  reports that he quit smoking about 9 months ago. His smoking use included cigarettes. He has a 128.00 pack-year smoking history. He has never used smokeless tobacco. He reports that he does not drink alcohol or use drugs.   No Known Allergies  Medications:  Prior to Admission medications   Medication Sig Start Date End Date Taking? Authorizing Provider  acetaminophen (TYLENOL) 325 MG tablet Take 325 mg by mouth every 6 (six) hours as needed for mild pain, fever or headache.    Yes [provider]  aspirin 325 MG tablet Take 1 tablet (325 mg total) by mouth daily. 04/10/18 05/10/18 Yes Hilty, Nadean Corwin, MD  atorvastatin (LIPITOR) 40 MG tablet Take 1 tablet (40 mg total) by mouth daily. 04/11/18 05/11/18 Yes Hilty, Nadean Corwin, MD  bethanechol (URECHOLINE) 5 MG tablet Take 5 mg by mouth 3 (three) times daily.   Yes [provider]  hydrALAZINE (APRESOLINE) 10 MG tablet Take 1 tablet (10 mg total) by mouth 3 (three) times daily. 04/10/18 05/10/18 Yes Hilty, Nadean Corwin, MD  levETIRAcetam (KEPPRA) 750 MG tablet Take 2 tablets (1,500 mg total) by mouth 2  (two) times daily. 04/15/18  Yes Kathrynn Ducking, MD  losartan (COZAAR) 100 MG tablet Take 1 tablet (100 mg total) by mouth daily. 04/10/18 05/10/18 Yes Hilty, Nadean Corwin, MD  Multiple Vitamins-Minerals (MULTIVITAMIN ADULTS PO) Take 1 tablet by mouth daily.   Yes [provider]  phenytoin (DILANTIN) 100 MG ER capsule Take 1 capsule (100 mg total) by mouth 3 (three) times daily. 04/24/18 05/24/18 Yes Kathrynn Ducking, MD  sennosides-docusate sodium (SENOKOT-S) 8.6-50 MG tablet Take 1 tablet by mouth daily.   Yes [provider]  topiramate (TOPAMAX) 200 MG tablet Take 1 tablet (200 mg total) by mouth 2 (two) times daily. 04/24/18  Yes Kathrynn Ducking, MD  famotidine (PEPCID) 20 MG tablet Take 1 tablet (20 mg total) by mouth 2 (two) times daily. 03/29/18 04/28/18  Alma Friendly, MD  ferrous sulfate 325 (65 FE) MG tablet Take 1 tablet (325 mg total) by mouth daily with breakfast. 03/29/18 04/28/18  Alma Friendly, MD  QUEtiapine (SEROQUEL) 25 MG tablet Take 1 tablet (25 mg total) by mouth See admin instructions. 25mg  in the am and 50mg  in the pm 03/29/18 04/28/18  Alma Friendly, MD  sertraline (ZOLOFT) 50 MG tablet Take 1 tablet (50 mg total) by mouth daily. 03/29/18 04/28/18  Alma Friendly, MD    ROS:  Out of a complete 14 system review of symptoms, the patient complains only of the following symptoms, and all other reviewed systems are negative.  Weight loss Swelling in the legs Dizziness Snoring Constipation Feeling cold, increased thirst Aching muscles Memory loss, confusion, slurred speech, dizziness, tremor Depression, anxiety, none of sleep, disinterest in activities, racing thoughts  Blood pressure (!) 130/58, pulse 75, height 5\' 7"  (1.702 m), weight 138 lb (62.6 kg), SpO2 95 %.  Physical Exam  General: The patient is alert and cooperative at the time of the examination.  Skin: 1+ edema at the ankles is noted.   Neurologic Exam  Mental  status: The patient is alert and oriented x 3 at the time of the examination. The patient has apparent normal recent and remote memory, with an apparently normal attention span and concentration ability.   Cranial nerves: Facial symmetry is present. Speech is normal, no aphasia or dysarthria is noted. Extraocular movements are full. Visual fields are full.  Motor: The patient has good strength in all 4 extremities.  Sensory examination: Soft touch sensation is symmetric on the face, arms, and legs.  Coordination: The patient has good finger-nose-finger and heel-to-shin bilaterally.  Gait and station: The patient has a slightly wide-based gait, the patient has good stability with using a cane.  Tandem gait was not attempted. Romberg is negative. No drift is seen.  Reflexes: Deep tendon reflexes are symmetric.   Assessment/Plan:  1.  Seizures  2.  Prior Dilantin toxicity  The patient is having a multitude of side  effects on his current seizure regimen.  He is on high-dose of Vimpat, Keppra, and Topamax.  The family is having a difficult time affording the Vimpat, the Topamax likely is associated with significant cognitive side effects and the patient is having irritability on the Keppra.  The patient will be tapered off of Vimpat by 100 mg every 3 weeks until off the drug.  The family will call me, we will then initiate a taper off the Topamax.  Dilantin level and Keppra level be checked today.  The Seroquel will be stopped.  Jill Alexanders MD 05/02/2018 12:46 PM  [facility] [address] [place], [place] [address]  Phone [phone] Fax [phone]

## 2018-05-03 LAB — PHENYTOIN LEVEL, TOTAL: Phenytoin (Dilantin), Serum: 10 ug/mL (ref 10.0–20.0)

## 2018-05-03 LAB — LEVETIRACETAM LEVEL: Levetiracetam Lvl: 65.8 ug/mL — ABNORMAL HIGH (ref 10.0–40.0)

## 2018-05-05 ENCOUNTER — Telehealth: Payer: Self-pay | Admitting: Neurology

## 2018-05-05 MED ORDER — LEVETIRACETAM 750 MG PO TABS: ORAL_TABLET | ORAL | 5 refills | Status: DC

## 2018-05-05 NOTE — Telephone Encounter (Signed)
I called the patient.  The Dilantin level was in the low therapeutic range, no change in dosing.  The Keppra level is elevated, the patient was complaining of irritability, we will reduce the dose taking 750 mg in the morning and 1500 mg in the evening.  They will continue the Vimpat taper as planned.

## 2018-05-07 DIAGNOSIS — I10 Essential (primary) hypertension: Secondary | ICD-10-CM | POA: Diagnosis not present

## 2018-05-07 DIAGNOSIS — G40909 Epilepsy, unspecified, not intractable, without status epilepticus: Secondary | ICD-10-CM | POA: Diagnosis not present

## 2018-05-07 DIAGNOSIS — E7800 Pure hypercholesterolemia, unspecified: Secondary | ICD-10-CM | POA: Diagnosis not present

## 2018-05-30 DIAGNOSIS — G4733 Obstructive sleep apnea (adult) (pediatric): Secondary | ICD-10-CM | POA: Diagnosis not present

## 2018-05-30 DIAGNOSIS — J9690 Respiratory failure, unspecified, unspecified whether with hypoxia or hypercapnia: Secondary | ICD-10-CM | POA: Diagnosis not present

## 2018-05-30 DIAGNOSIS — E46 Unspecified protein-calorie malnutrition: Secondary | ICD-10-CM | POA: Diagnosis not present

## 2018-05-30 DIAGNOSIS — J189 Pneumonia, unspecified organism: Secondary | ICD-10-CM | POA: Diagnosis not present

## 2018-05-30 DIAGNOSIS — G9340 Encephalopathy, unspecified: Secondary | ICD-10-CM | POA: Diagnosis not present

## 2018-06-04 ENCOUNTER — Other Ambulatory Visit: Payer: Self-pay | Admitting: *Deleted

## 2018-06-04 DIAGNOSIS — R0683 Snoring: Secondary | ICD-10-CM

## 2018-06-18 DIAGNOSIS — G4733 Obstructive sleep apnea (adult) (pediatric): Secondary | ICD-10-CM | POA: Diagnosis not present

## 2018-06-21 ENCOUNTER — Telehealth: Payer: Self-pay

## 2018-06-21 NOTE — Telephone Encounter (Signed)
Attempted to call patient, no answer, there was not an option to leave a voice message. Will try to call patient back another day.

## 2018-06-21 NOTE — Telephone Encounter (Signed)
-----   Message from Juanito Doom, MD sent at 06/20/2018  2:05 PM EDT ----- Hi, Please let him know that his sleep study showed moderate sleep apnea.  He probably needs CPAP.  When can he return to clinic to discuss? Ruby Cola ----- Message ----- From: Chesley Mires, MD Sent: 06/20/2018   2:00 PM EDT To: Juanito Doom, MD  Ruby Cola,  Home sleep study from 05/30/18 with moderate obstructive sleep apnea.  AHI 15.2, SpO2 low 82%.  Thanks.  [place]

## 2018-06-24 NOTE — Telephone Encounter (Signed)
Attempted to call patient, no answer and unable to leave voice message. Will try patient back again on another day.

## 2018-06-26 DIAGNOSIS — Z23 Encounter for immunization: Secondary | ICD-10-CM | POA: Diagnosis not present

## 2018-06-27 NOTE — Telephone Encounter (Signed)
Attempted to call patient and unable to leave message. Printed letter to mail to patient to call for results. Will close encounter.

## 2018-07-03 ENCOUNTER — Telehealth: Payer: Self-pay | Admitting: Pulmonary Disease

## 2018-07-03 DIAGNOSIS — G40909 Epilepsy, unspecified, not intractable, without status epilepticus: Secondary | ICD-10-CM | POA: Diagnosis not present

## 2018-07-03 DIAGNOSIS — E538 Deficiency of other specified B group vitamins: Secondary | ICD-10-CM | POA: Diagnosis not present

## 2018-07-03 DIAGNOSIS — E7439 Other disorders of intestinal carbohydrate absorption: Secondary | ICD-10-CM | POA: Diagnosis not present

## 2018-07-03 DIAGNOSIS — R011 Cardiac murmur, unspecified: Secondary | ICD-10-CM | POA: Diagnosis not present

## 2018-07-03 DIAGNOSIS — E7800 Pure hypercholesterolemia, unspecified: Secondary | ICD-10-CM | POA: Diagnosis not present

## 2018-07-03 DIAGNOSIS — R569 Unspecified convulsions: Secondary | ICD-10-CM | POA: Diagnosis not present

## 2018-07-03 NOTE — Telephone Encounter (Signed)
Called and spoke with patient regarding results.  Informed the patient of results and recommendations today. Scheduled appt with B.Mack for 07/08/18 at 10:30am Pt verbalized understanding and denied any questions or concerns at this time.  Nothing further needed.

## 2018-07-03 NOTE — Telephone Encounter (Signed)
Moderate obstructive sleep apnea O2 saturation down to 82% on room air Does he have an appointment with [place] soon? If so we could discuss treatment then

## 2018-07-03 NOTE — Telephone Encounter (Signed)
Dr Lake Bells- have you received the HST yet?  Please advise, thanks

## 2018-07-07 NOTE — Progress Notes (Signed)
@Patient  ID: Danny Krause, male    DOB: 1941/03/12, 77 y.o.   MRN: [id number]  Chief Complaint  Patient presents with  . Follow-up    HST results     Referring provider: Jani Gravel, MD  HPI:  77 year old male former smoker (quit 06/2017) initially referred to our office in 2019 for dyspnea and COPD.  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  Quit 06/2017.  128-pack-year smoking history. Maintenance: Symbicort 160 Pt of: [place]   07/08/2018  - Visit   77 year old male patient presenting today for follow-up visit discuss home sleep study results.  September/2019 Home sleep study showing AHI of 15.2 and SPO2 low of 82%.  Patient has also completed follow-up CT ordered at last office visit in May/2019.  Patient has extensive work-up with neurology has had multiple hospitalizations recently.  Patient unsure if he supposed to be taking Symbicort 160.  Samples were provided for him back in May/2019 the patient has not been taking this regularly.  Patient is unsure if he needs to take this regularly or if this is a medication that can be stopped.  Patient also does have a rescue inhaler that he has been given.  Patient reports he has not had to use this recently.  Unfortunately the patient also lost his spouse of 33 years.  Patient reports that she passed away last month.  Patient reports today with his son who also helps take care of him.  Patient has a very involved son and daughter who help take care of him.  Patient currently is staying with son or daughter at this time.  Also, patient is now in the donut hole.  This complicates his medication management as well as his follow-up with providers.  They report that in January this will reset.  Unfortunately though until then he is in the coverage gap.   Tests:  05/30/18 with moderate obstructive sleep apnea.  AHI 15.2, SpO2 low 82%.  03/27/2018-CT chest without contrast-  emphysema, upper lobe predominant, no pulmonary nodule identified, trace left pleural effusion  02/11/2018-swallow study- dysphagia, mild aspiration risk  PFT: 06/2017 : FEV1 1.41L (53% pre), DLCO 8.35 ml (29%)  Imaging:  12/20/2017-chest x-ray- mild right basilar atelectasis 07/02/2017-CT Angio chest with contrast- no evidence of thoracic aortic aneurysm, aortic arthrosclerosis, 1.3 cm solid nodule in left lower lobe, 1.7 cm solid nodule in the right lower lobe  Cardiac:  12/19/2017-echocardiogram- LV ejection fraction 55 to 08%, grade 1 diastolic dysfunction  FENO:  No results found for: NITRICOXIDE  PFT: PFT Results Latest Ref Rng & Units 07/03/2017  FVC-Pre L 3.19  FVC-Predicted Pre % 86  FVC-Post L 4.03  FVC-Predicted Post % 109  Pre FEV1/FVC % % 44  Post FEV1/FCV % % 43  FEV1-Pre L 1.41  FEV1-Predicted Pre % 53  DLCO UNC% % 29  DLCO COR %Predicted % 48  TLC L 8.09  TLC % Predicted % 125  RV % Predicted % 186    Imaging: No results found.  Chart Review:  12/2017-hospital discharge for respiratory failure and seizures (treated with Keppra), required mechanical ventilation  >>>12/19/2017-EEG- no seizure or seizure predicted predisposition recorded on the study 06/2017- hospitalization status post CABG  Specialty Problems      Pulmonary Problems   COPD, severe (HCC)   Acute respiratory failure ([place])   Acute respiratory failure with hypoxia and hypercarbia (HCC)   Lung nodule seen on imaging study  07/02/2017-CT Angio chest with contrast- no evidence of thoracic aortic aneurysm, aortic arthrosclerosis, 1.3 cm solid nodule in left lower lobe, 1.7 cm solid nodule in the right lower lobe  03/27/2018-CT chest without contrast- emphysema, upper lobe predominant, no pulmonary nodule identified, trace left pleural effusion       Endotracheally intubated   Tracheobronchitis   OSA (obstructive sleep apnea)    05/30/18 with moderate obstructive sleep apnea.  AHI 15.2, SpO2  low 82%.          No Known Allergies  Immunization History  Administered Date(s) Administered  . Influenza, High Dose Seasonal PF 07/08/2017, 06/07/2018  . Tdap 03/13/2017   >>> Patient has received flu vaccine this year reports it was high-dose, patient has received Prevnar 13 before, they are unsure about Pneumovax 23, they will get records from [facility] for office to update  Past Medical History:  Diagnosis Date  . Anginal pain ([place])    with exertion  . Coronary artery disease   . Critical aortic valve stenosis   . Dyspnea    with exertion  . 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 Murmur   . Seizure ([place])   . Stroke ([place])   . Syncope     Tobacco History: Social History   Tobacco Use  Smoking Status Former Smoker  . Packs/day: 2.00  . Years: 64.00  . Pack years: 128.00  . Types: Cigarettes  . Last attempt to quit: 07/04/2017  . Years since quitting: 1.0  Smokeless Tobacco Never Used  Tobacco Comment   SINCE I WAS 77 YRS OLD   Counseling given: Yes Comment: SINCE I WAS 77 YRS OLD  Thankful that the patient is stop smoking.  Continue not smoking.  Outpatient Encounter Medications as of 07/08/2018  Medication Sig  . acetaminophen (TYLENOL) 325 MG tablet Take 325 mg by mouth every 6 (six) hours as needed for mild pain, fever or headache.   . albuterol (PROVENTIL HFA;VENTOLIN HFA) 108 (90 Base) MCG/ACT inhaler Inhale 1-2 puffs into the lungs every 6 (six) hours as needed for wheezing or shortness of breath.  . bethanechol (URECHOLINE) 5 MG tablet Take 5 mg by mouth 3 (three) times daily.  Marland Kitchen levETIRAcetam (KEPPRA) 750 MG tablet One tablet in the morning and 2 in the evening  . Multiple Vitamins-Minerals (MULTIVITAMIN ADULTS PO) Take 1 tablet by mouth daily.  . sennosides-docusate sodium (SENOKOT-S) 8.6-50 MG tablet Take 1 tablet by mouth daily.  . [DISCONTINUED] topiramate (TOPAMAX) 200 MG tablet Take 1 tablet (200 mg total) by mouth 2 (two) times daily.  Marland Kitchen  atorvastatin (LIPITOR) 40 MG tablet Take 1 tablet (40 mg total) by mouth daily.  . budesonide-formoterol (SYMBICORT) 160-4.5 MCG/ACT inhaler Inhale 2 puffs into the lungs 2 (two) times daily.  . famotidine (PEPCID) 20 MG tablet Take 1 tablet (20 mg total) by mouth 2 (two) times daily.  . ferrous sulfate 325 (65 FE) MG tablet Take 1 tablet (325 mg total) by mouth daily with breakfast.  . hydrALAZINE (APRESOLINE) 10 MG tablet Take 1 tablet (10 mg total) by mouth 3 (three) times daily.  Marland Kitchen losartan (COZAAR) 100 MG tablet Take 1 tablet (100 mg total) by mouth daily.  . phenytoin (DILANTIN) 100 MG ER capsule Take 1 capsule (100 mg total) by mouth 3 (three) times daily.  . QUEtiapine (SEROQUEL) 25 MG tablet Take 1 tablet (25 mg total) by mouth See admin instructions. 25mg  in the am and 50mg  in the pm  . sertraline (ZOLOFT) 50 MG  tablet Take 1 tablet (50 mg total) by mouth daily.  Marland Kitchen Spacer/Aero-Holding Dorise Bullion Use with your symbicort and your rescue inhaler   No facility-administered encounter medications on file as of 07/08/2018.      Review of Systems  Review of Systems  Constitutional: Positive for fatigue. Negative for activity change, chills, fever and unexpected weight change.  HENT: Negative for congestion, postnasal drip, rhinorrhea, sneezing and sore throat.   Eyes: Negative.   Respiratory: Positive for cough and shortness of breath. Negative for wheezing.   Cardiovascular: Negative for chest pain, palpitations and leg swelling.  Gastrointestinal: Negative for constipation, diarrhea, nausea and vomiting.  Endocrine: Negative.   Musculoskeletal: Negative.   Skin: Negative.   Neurological: Negative for dizziness and headaches.  Psychiatric/Behavioral: Positive for dysphoric mood and sleep disturbance (Wakes up often). Negative for self-injury and suicidal ideas. The patient is not nervous/anxious.   All other systems reviewed and are negative.    Physical Exam  BP 102/64 (BP  Location: Left Arm, Cuff Size: Normal)   Pulse 75   Ht 5' 5.25" (1.657 m)   Wt 138 lb 3.2 oz (62.7 kg)   SpO2 96%   BMI 22.82 kg/m   Wt Readings from Last 5 Encounters:  07/08/18 138 lb 3.2 oz (62.7 kg)  05/02/18 138 lb (62.6 kg)  04/10/18 139 lb 3.2 oz (63.1 kg)  03/27/18 160 lb 0.9 oz (72.6 kg)  01/31/18 147 lb 11.3 oz (67 kg)    Physical Exam  Constitutional: He is oriented to person, place, and time and well-developed, well-nourished, and in no distress. No distress.  HENT:  Head: Normocephalic and atraumatic.  Right Ear: Hearing, tympanic membrane, external ear and ear canal normal.  Left Ear: Hearing, tympanic membrane, external ear and ear canal normal.  Nose: Nose normal. Right sinus exhibits no maxillary sinus tenderness and no frontal sinus tenderness. Left sinus exhibits no maxillary sinus tenderness and no frontal sinus tenderness.  Mouth/Throat: Uvula is midline and oropharynx is clear and moist. No oropharyngeal exudate.  Eyes: Pupils are equal, round, and reactive to light.  Neck: Normal range of motion. Neck supple. No JVD present.  Cardiovascular: Normal rate, regular rhythm and normal heart sounds.  Pulmonary/Chest: Effort normal. No accessory muscle usage. No respiratory distress. He has no decreased breath sounds. He has wheezes (Right upper lobe exp wheeze). He has no rhonchi.  Abdominal: Soft. Bowel sounds are normal. There is no tenderness.  Musculoskeletal: Normal range of motion. He exhibits no edema.  Lymphadenopathy:    He has no cervical adenopathy.  Neurological: He is alert and oriented to person, place, and time. Gait normal.  Skin: Skin is warm and dry. He is not diaphoretic. No erythema.  Psychiatric: Memory, affect and judgment normal. His mood appears anxious. He exhibits a depressed mood. He expresses no homicidal and no suicidal ideation. He expresses no suicidal plans and no homicidal plans.  + Patient is tearful throughout exam and interview.   Patient misses his wife.  Patient denies thoughts of suicide or homicide.  Nursing note and vitals reviewed.     Lab Results:  CBC    Component Value Date/Time   WBC 6.3 03/29/2018 0352   RBC 3.55 (L) 03/29/2018 0352   HGB 9.4 (L) 03/29/2018 0352   HGB 9.6 (L) 07/19/2017 1043   HCT 31.3 (L) 03/29/2018 0352   HCT 31.2 (L) 07/19/2017 1043   PLT 275 03/29/2018 0352   PLT 556 (H) 07/19/2017 1043   MCV  88.2 03/29/2018 0352   MCV 83 07/19/2017 1043   MCH 26.5 03/29/2018 0352   MCHC 30.0 03/29/2018 0352   RDW 17.0 (H) 03/29/2018 0352   RDW 15.3 07/19/2017 1043   LYMPHSABS 1.2 03/29/2018 0352   MONOABS 0.7 03/29/2018 0352   EOSABS 0.5 03/29/2018 0352   BASOSABS 0.0 03/29/2018 0352    BMET    Component Value Date/Time   NA 138 03/29/2018 0352   NA 139 07/19/2017 1043   K 4.3 03/29/2018 0352   CL 106 03/29/2018 0352   CO2 25 03/29/2018 0352   GLUCOSE 90 03/29/2018 0352   BUN 12 03/29/2018 0352   BUN 18 07/19/2017 1043   CREATININE 0.99 03/29/2018 0352   CALCIUM 8.8 (L) 03/29/2018 0352   GFRNONAA >60 03/29/2018 0352   GFRAA >60 03/29/2018 0352    BNP No results found for: BNP  ProBNP No results found for: PROBNP    Assessment & Plan:   Pleasant 77 year old male patient reporting today for follow-up to discuss home sleep study results.  We will start patient on CPAP therapy.  APAP settings 5-15.  Mask of choice.  Patient to follow-up in 6 to 8 weeks for 30-day compliance.  Patient's son who is with patient today currently uses a CPAP.  They would prefer to go through [facility] as the DME.  I will restart patient on Symbicort 160.  Samples provided today.  Spacer provided today.  Patient educated on how to use the inhaler.  I would prefer to have patient on lama/laba inhaler in the future can consider in January/2020.  We do not have samples of any of these therapies at this time.  I have encouraged the patient to discuss with neurology whether or not he can be on any  chronic therapies for management of depression for his grief at this time.  Son reports that neurology has been trying to taper and change some of these medications recently.  Patient and son will follow-up with neurology next week.  COPD, severe (HCC) Continue Symbicort 160 >>> 2 puffs in the morning right when you wake up, rinse out your mouth after use, 12 hours later 2 puffs, rinse after use >>> Take this daily, no matter what >>> This is not a rescue inhaler   >>>use spacer  Only use your albuterol as a rescue medication to be used if you can't catch your breath by resting or doing a relaxed purse lip breathing pattern.  - The less you use it, the better it will work when you need it. - Ok to use up to 2 puffs  every 4 hours if you must but call for immediate appointment if use goes up over your usual need - Don't leave home without it !!  (think of it like the spare tire for your car)   Note your daily symptoms > remember "red flags" for COPD:   >>>Increase in cough >>>increase in sputum production >>>increase in shortness of breath or activity  intolerance.   If you notice these symptoms, please call the office to be seen.    Follow up in 6-8 weeks with our office     OSA (obstructive sleep apnea) Will place an order start CPAP APAP settings 5-15 Mask of choice [facility] D[facility]  Follow up in 6-8 weeks with our office   We recommend that you start using your CPAP daily >>>Keep up the hard work using your device >>> Goal should be wearing this for the entire night  that you are sleeping, at least 4 to 6 hours  Remember:  . Do not drive or operate heavy machinery if tired or drowsy.  . Please notify the supply company and office if you are unable to use your device regularly due to missing supplies or machine being broken.  . Work on maintaining a healthy weight and following your recommended nutrition plan  . Maintain proper daily exercise and movement  . Maintaining proper  use of your device can also help improve management of other chronic illnesses such as: Blood pressure, blood sugars, and weight management.   BiPAP/ CPAP Cleaning:  >>>Clean weekly, with Dawn soap, and bottle brush.  Set up to air dry.  Depression Patient to follow-up with neurology and primary care regarding his depression Patient is currently grieving the loss of his wife for the last month   Lung nodule seen on imaging study Resolved on recent CT.   This appointment was 36 minutes along with her 50% that time in direct face-to-face patient care, assessment, plan of care follow-up.  Lauraine Rinne, NP 07/08/2018

## 2018-07-08 ENCOUNTER — Ambulatory Visit (INDEPENDENT_AMBULATORY_CARE_PROVIDER_SITE_OTHER): Payer: Medicare Other | Admitting: Pulmonary Disease

## 2018-07-08 ENCOUNTER — Encounter: Payer: Self-pay | Admitting: Pulmonary Disease

## 2018-07-08 ENCOUNTER — Telehealth: Payer: Self-pay | Admitting: Neurology

## 2018-07-08 VITALS — BP 102/64 | HR 75 | Ht 65.25 in | Wt 138.2 lb

## 2018-07-08 DIAGNOSIS — G4733 Obstructive sleep apnea (adult) (pediatric): Secondary | ICD-10-CM | POA: Diagnosis not present

## 2018-07-08 DIAGNOSIS — J449 Chronic obstructive pulmonary disease, unspecified: Secondary | ICD-10-CM | POA: Diagnosis not present

## 2018-07-08 DIAGNOSIS — R911 Solitary pulmonary nodule: Secondary | ICD-10-CM | POA: Diagnosis not present

## 2018-07-08 DIAGNOSIS — F329 Major depressive disorder, single episode, unspecified: Secondary | ICD-10-CM

## 2018-07-08 DIAGNOSIS — F32A Depression, unspecified: Secondary | ICD-10-CM

## 2018-07-08 MED ORDER — BUDESONIDE-FORMOTEROL FUMARATE 160-4.5 MCG/ACT IN AERO: 2.0000 | INHALATION_SPRAY | Freq: Two times a day (BID) | RESPIRATORY_TRACT | 0 refills | Status: DC

## 2018-07-08 MED ORDER — SPACER/AERO-HOLDING CHAMBERS DEVI: 0 refills | Status: AC

## 2018-07-08 MED ORDER — TOPIRAMATE 50 MG PO TABS: ORAL_TABLET | ORAL | 3 refills | Status: DC

## 2018-07-08 NOTE — Telephone Encounter (Signed)
I called and talk to the wife.  The patient has done well with the taper off of Vimpat.  We will start the 50 mg tablets of Topamax and taper by 50 mg every 2 weeks until he is off the medication.  Currently he is on 200 mg twice daily, and will take several months to get him off the medication.

## 2018-07-08 NOTE — Assessment & Plan Note (Addendum)
Will place an order start CPAP APAP settings 5-15 [facility]  Follow up in 6-8 weeks with our office   We recommend that you start using your CPAP daily >>>Keep up the hard work using your device >>> Goal should be wearing this for the entire night that you are sleeping, at least 4 to 6 hours  Remember:  . Do not drive or operate heavy machinery if tired or drowsy.  . Please notify the supply company and office if you are unable to use your device regularly due to missing supplies or machine being broken.  . Work on maintaining a healthy weight and following your recommended nutrition plan  . Maintain proper daily exercise and movement  . Maintaining proper use of your device can also help improve management of other chronic illnesses such as: Blood pressure, blood sugars, and weight management.   BiPAP/ CPAP Cleaning:  >>>Clean weekly, with Dawn soap, and bottle brush.  Set up to air dry.

## 2018-07-08 NOTE — Telephone Encounter (Signed)
Spoke with dtr. Pt. has completely tapered off of Vimpat--last dose was 07/04/18. He is now ready to taper off of Topamax, as discussed at his Aug. ov. Will check with Dr. Jannifer Franklin to see how he would like pt. to taper off of Topamax/fim

## 2018-07-08 NOTE — Progress Notes (Signed)
Reviewed, agree 

## 2018-07-08 NOTE — Patient Instructions (Addendum)
Continue Symbicort 160 >>> 2 puffs in the morning right when you wake up, rinse out your mouth after use, 12 hours later 2 puffs, rinse after use >>> Take this daily, no matter what >>> This is not a rescue inhaler   >>>use spacer  Only use your albuterol as a rescue medication to be used if you can't catch your breath by resting or doing a relaxed purse lip breathing pattern.  - The less you use it, the better it will work when you need it. - Ok to use up to 2 puffs  every 4 hours if you must but call for immediate appointment if use goes up over your usual need - Don't leave home without it !!  (think of it like the spare tire for your car)   Follow up in 6-8 weeks with our office   We recommend that you start using your CPAP daily >>>Keep up the hard work using your device >>> Goal should be wearing this for the entire night that you are sleeping, at least 4 to 6 hours  Remember:  . Do not drive or operate heavy machinery if tired or drowsy.  . Please notify the supply company and office if you are unable to use your device regularly due to missing supplies or machine being broken.  . Work on maintaining a healthy weight and following your recommended nutrition plan  . Maintain proper daily exercise and movement  . Maintaining proper use of your device can also help improve management of other chronic illnesses such as: Blood pressure, blood sugars, and weight management.   BiPAP/ CPAP Cleaning:  >>>Clean weekly, with Dawn soap, and bottle brush.  Set up to air dry.  November/2019 we will be moving! We will no longer be at our [place] location.  Be on the look out for a post card/mailer to let you know we have officially moved.  Our new address and phone number will be:  [address] [place], [place] [address] Telephone number: [phone]  It is flu season:   >>>Remember to be washing your hands regularly, using hand sanitizer, be careful to use around herself with has  contact with people who are sick will increase her chances of getting sick yourself. >>> Best ways to protect herself from the flu: Receive the yearly flu vaccine, practice good hand hygiene washing with soap and also using hand sanitizer when available, eat a nutritious meals, get adequate rest, hydrate appropriately   Please contact the office if your symptoms worsen or you have concerns that you are not improving.   Thank you for choosing [facility] for your healthcare, and for allowing [place] to partner with you on your healthcare journey. I am thankful to be able to provide care to you today.   Wyn Quaker FNP-C      CPAP and BiPAP Information CPAP and BiPAP are methods of helping a person breathe with the use of air pressure. CPAP stands for "continuous positive airway pressure." BiPAP stands for "bi-level positive airway pressure." In both methods, air is blown through your nose or mouth and into your air passages to help you breathe well. CPAP and BiPAP use different amounts of pressure to blow air. With CPAP, the amount of pressure stays the same while you breathe in and out. With BiPAP, the amount of pressure is increased when you breathe in (inhale) so that you can take larger breaths. Your health care provider will recommend whether CPAP  or BiPAP would be more helpful for you. Why are CPAP and BiPAP treatments used? CPAP or BiPAP can be helpful if you have:  Sleep apnea.  Chronic obstructive pulmonary disease (COPD).  Heart failure.  Medical conditions that weaken the muscles of the chest including muscular dystrophy, or neurological diseases such as amyotrophic lateral sclerosis (ALS).  Other problems that cause breathing to be weak, abnormal, or difficult.  CPAP is most commonly used for obstructive sleep apnea (OSA) to keep the airways from collapsing when the muscles relax during sleep. How is CPAP or BiPAP administered? Both CPAP and BiPAP are provided by a  small machine with a flexible plastic tube that attaches to a plastic mask. You wear the mask. Air is blown through the mask into your nose or mouth. The amount of pressure that is used to blow the air can be adjusted on the machine. Your health care provider will determine the pressure setting that should be used based on your individual needs. When should CPAP or BiPAP be used? In most cases, the mask only needs to be worn during sleep. Generally, the mask needs to be worn throughout the night and during any daytime naps. People with certain medical conditions may also need to wear the mask at other times when they are awake. Follow instructions from your health care provider about when to use the machine. What are some tips for using the mask?  Because the mask needs to be snug, some people feel trapped or closed-in (claustrophobic) when first using the mask. If you feel this way, you may need to get used to the mask. One way to do this is by holding the mask loosely over your nose or mouth and then gradually applying the mask more snugly. You can also gradually increase the amount of time that you use the mask.  Masks are available in various types and sizes. Some fit over your mouth and nose while others fit over just your nose. If your mask does not fit well, talk with your health care provider about getting a different one.  If you are using a mask that fits over your nose and you tend to breathe through your mouth, a chin strap may be applied to help keep your mouth closed.  The CPAP and BiPAP machines have alarms that may sound if the mask comes off or develops a leak.  If you have trouble with the mask, it is very important that you talk with your health care provider about finding a way to make the mask easier to tolerate. Do not stop using the mask. Stopping the use of the mask could have a negative impact on your health. What are some tips for using the machine?  Place your CPAP or BiPAP  machine on a secure table or stand near an electrical outlet.  Know where the on/off switch is located on the machine.  Follow instructions from your health care provider about how to set the pressure on your machine and when you should use it.  Do not eat or drink while the CPAP or BiPAP machine is on. Food or fluids could get pushed into your lungs by the pressure of the CPAP or BiPAP.  Do not smoke. Tobacco smoke residue can damage the machine.  For home use, CPAP and BiPAP machines can be rented or purchased through home health care companies. Many different brands of machines are available. Renting a machine before purchasing may help you find out which particular  machine works well for you.  Keep the CPAP or BiPAP machine and attachments clean. Ask your health care provider for specific instructions. Get help right away if:  You have redness or open areas around your nose or mouth where the mask fits.  You have trouble using the CPAP or BiPAP machine.  You cannot tolerate wearing the CPAP or BiPAP mask.  You have pain, discomfort, and bloating in your abdomen. Summary  CPAP and BiPAP are methods of helping a person breathe with the use of air pressure.  Both CPAP and BiPAP are provided by a small machine with a flexible plastic tube that attaches to a plastic mask.  If you have trouble with the mask, it is very important that you talk with your health care provider about finding a way to make the mask easier to tolerate. This information is not intended to replace advice given to you by your health care provider. Make sure you discuss any questions you have with your health care provider. Document Released: 05/19/2004 Document Revised: 07/10/2016 Document Reviewed: 07/10/2016 Elsevier Interactive Patient Education  2017 Elsevier Inc.       Sleep Apnea Sleep apnea is a condition that affects breathing. People with sleep apnea have moments during sleep when their breathing  pauses briefly or gets shallow. Sleep apnea can cause these symptoms:  Trouble staying asleep.  Sleepiness or tiredness during the day.  Irritability.  Loud snoring.  Morning headaches.  Trouble concentrating.  Forgetting things.  Less interest in sex.  Being sleepy for no reason.  Mood swings.  Personality changes.  Depression.  Waking up a lot during the night to pee (urinate).  Dry mouth.  Sore throat.  Follow these instructions at home:  Make any changes in your routine that your doctor recommends.  Eat a healthy, well-balanced diet.  Take over-the-counter and prescription medicines only as told by your doctor.  Avoid using alcohol, calming medicines (sedatives), and narcotic medicines.  Take steps to lose weight if you are overweight.  If you were given a machine (device) to use while you sleep, use it only as told by your doctor.  Do not use any tobacco products, such as cigarettes, chewing tobacco, and e-cigarettes. If you need help quitting, ask your doctor.  Keep all follow-up visits as told by your doctor. This is important. Contact a doctor if:  The machine that you were given to use during sleep is uncomfortable or does not seem to be working.  Your symptoms do not get better.  Your symptoms get worse. Get help right away if:  Your chest hurts.  You have trouble breathing in enough air (shortness of breath).  You have an uncomfortable feeling in your back, arms, or stomach.  You have trouble talking.  One side of your body feels weak.  A part of your face is hanging down (drooping). These symptoms may be an emergency. Do not wait to see if the symptoms will go away. Get medical help right away. Call your local emergency services (911 in the [place] Do not drive yourself to the hospital. This information is not intended to replace advice given to you by your health care provider. Make sure you discuss any questions you have with your  health care provider. Document Released: 05/30/2008 Document Revised: 04/16/2016 Document Reviewed: 05/31/2015 Elsevier Interactive Patient Education  [employer].

## 2018-07-08 NOTE — Telephone Encounter (Signed)
Pts daughter [name] (on [place]) requesting a call to discuss the pts schedule for coming off of medication topiramate (TOPAMAX) 200 MG tablet

## 2018-07-08 NOTE — Assessment & Plan Note (Signed)
Patient to follow-up with neurology and primary care regarding his depression Patient is currently grieving the loss of his wife for the last month

## 2018-07-08 NOTE — Assessment & Plan Note (Signed)
Continue Symbicort 160 >>> 2 puffs in the morning right when you wake up, rinse out your mouth after use, 12 hours later 2 puffs, rinse after use >>> Take this daily, no matter what >>> This is not a rescue inhaler   >>>use spacer  Only use your albuterol as a rescue medication to be used if you can't catch your breath by resting or doing a relaxed purse lip breathing pattern.  - The less you use it, the better it will work when you need it. - Ok to use up to 2 puffs  every 4 hours if you must but call for immediate appointment if use goes up over your usual need - Don't leave home without it !!  (think of it like the spare tire for your car)   Note your daily symptoms > remember "red flags" for COPD:   >>>Increase in cough >>>increase in sputum production >>>increase in shortness of breath or activity  intolerance.   If you notice these symptoms, please call the office to be seen.    Follow up in 6-8 weeks with our office

## 2018-07-08 NOTE — Assessment & Plan Note (Signed)
Resolved on recent CT.

## 2018-07-09 ENCOUNTER — Telehealth: Payer: Self-pay | Admitting: *Deleted

## 2018-07-09 NOTE — Telephone Encounter (Signed)
PA for Topiramate 50mg  #210/30, 3 tabs in the am and 4 tabs in the pm; decrease by 1 tab Q2wks (weaning off due to side effects) completed by phone with [employer]. Case ID: [id number]

## 2018-07-10 NOTE — Telephone Encounter (Signed)
Fax received from [facility] (phone# [phone]). Topamax approved until 09/04/19. Member ID: [id number]

## 2018-07-17 DIAGNOSIS — I639 Cerebral infarction, unspecified: Secondary | ICD-10-CM | POA: Diagnosis not present

## 2018-07-17 DIAGNOSIS — Z954 Presence of other heart-valve replacement: Secondary | ICD-10-CM | POA: Diagnosis not present

## 2018-07-17 DIAGNOSIS — F329 Major depressive disorder, single episode, unspecified: Secondary | ICD-10-CM | POA: Diagnosis not present

## 2018-07-17 DIAGNOSIS — R569 Unspecified convulsions: Secondary | ICD-10-CM | POA: Diagnosis not present

## 2018-07-17 DIAGNOSIS — I2510 Atherosclerotic heart disease of native coronary artery without angina pectoris: Secondary | ICD-10-CM | POA: Diagnosis not present

## 2018-07-17 DIAGNOSIS — J449 Chronic obstructive pulmonary disease, unspecified: Secondary | ICD-10-CM | POA: Diagnosis not present

## 2018-07-23 ENCOUNTER — Telehealth: Payer: Self-pay | Admitting: *Deleted

## 2018-07-23 NOTE — Telephone Encounter (Signed)
ATC patient since Danny Krause at [facility] has not been able to reach patient. Left message for patient to call back.   [facility] needs to have patient call them [phone], so they can recreate the SO.

## 2018-07-30 NOTE — Telephone Encounter (Signed)
Called and spoke with patient. I asked him if [facility] has contacted him.  Patient states they have not and he has been waiting. I let patient know we received an email stating [facility] has been trying to get ahold of the patient and could he call them.  Patient states no [facility] needs to call him and his number is [phone].    I ATC [employer] and left message with patient's new number to call him and get the SP created. I also left them my number to call if any questions.   Will leave open to follow up on

## 2018-08-19 ENCOUNTER — Other Ambulatory Visit: Payer: Self-pay | Admitting: Neurology

## 2018-08-19 ENCOUNTER — Ambulatory Visit: Payer: Medicare Other | Admitting: Pulmonary Disease

## 2018-08-19 NOTE — Telephone Encounter (Signed)
Called James at [facility] to verify if patient had been set up on CPAP, will await call back from [place].

## 2018-08-19 NOTE — Telephone Encounter (Addendum)
ATC patient unable to reach  [facility]  After talking with Jeneen Rinks from [facility] patient was set up with CPAP on 12.9.2019. patient needs follow up 6-8 weeks out.  Patient arrived to appt. Wyn Quaker FNP went to talk with patient to see if any acute symptoms to be seen today or just schedule out again after the 31 day requirement for CPAP starts  Patient is going to reschedule   Nothing further needed

## 2018-08-19 NOTE — Progress Notes (Deleted)
@Patient  ID: Danny Krause, male    DOB: December 10, 1940, 77 y.o.   MRN: [id number]  No chief complaint on file.   Referring provider: Jani Gravel, MD  HPI:  77 year old male former smoker (quit 06/2017) initially referred to our office in 2019 for dyspnea and COPD.  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  Quit 06/2017.  128-pack-year smoking history. Maintenance: Symbicort 160 Pt of: [place]  07/08/2018  - Visit   77 year old male patient presenting today for follow-up visit discuss home sleep study results.  September/2019 Home sleep study showing AHI of 15.2 and SPO2 low of 82%.  Patient has also completed follow-up CT ordered at last office visit in May/2019.  Patient has extensive work-up with neurology has had multiple hospitalizations recently.  Patient unsure if he supposed to be taking Symbicort 160.  Samples were provided for him back in May/2019 the patient has not been taking this regularly.  Patient is unsure if he needs to take this regularly or if this is a medication that can be stopped.  Patient also does have a rescue inhaler that he has been given.  Patient reports he has not had to use this recently.  Unfortunately the patient also lost his spouse of 72 years.  Patient reports that she passed away last month.  Patient reports today with his son who also helps take care of him.  Patient has a very involved son and daughter who help take care of him.  Patient currently is staying with son or daughter at this time.  Also, patient is now in the donut hole.  This complicates his medication management as well as his follow-up with providers.  They report that in January this will reset.  Unfortunately though until then he is in the coverage gap.   08/19/2018  - Visit   HPI  Tests:  05/30/18 with moderate obstructive sleep apnea.  AHI 15.2, SpO2 low 82%.  03/27/2018-CT chest without contrast- emphysema, upper  lobe predominant, no pulmonary nodule identified, trace left pleural effusion  02/11/2018-swallow study- dysphagia, mild aspiration risk  PFT: 06/2017 : FEV1 1.41L (53% pre), DLCO 8.35 ml (29%)  Imaging:  12/20/2017-chest x-ray- mild right basilar atelectasis 07/02/2017-CT Angio chest with contrast- no evidence of thoracic aortic aneurysm, aortic arthrosclerosis, 1.3 cm solid nodule in left lower lobe, 1.7 cm solid nodule in the right lower lobe  Cardiac:  12/19/2017-echocardiogram- LV ejection fraction 55 to 10%, grade 1 diastolic dysfunction  FENO:  No results found for: NITRICOXIDE  PFT: PFT Results Latest Ref Rng & Units 07/03/2017  FVC-Pre L 3.19  FVC-Predicted Pre % 86  FVC-Post L 4.03  FVC-Predicted Post % 109  Pre FEV1/FVC % % 44  Post FEV1/FCV % % 43  FEV1-Pre L 1.41  FEV1-Predicted Pre % 53  FEV1-Post L 1.72  DLCO UNC% % 29  DLCO COR %Predicted % 48  TLC L 8.09  TLC % Predicted % 125  RV % Predicted % 186    Imaging: No results found.  Chart Review:    Specialty Problems      Pulmonary Problems   COPD, severe ([place])   Acute respiratory failure (HCC)   Acute respiratory failure with hypoxia and hypercarbia (HCC)   Lung nodule seen on imaging study    07/02/2017-CT Angio chest with contrast- no evidence of thoracic aortic aneurysm, aortic arthrosclerosis, 1.3 cm solid nodule in left lower lobe, 1.7 cm solid nodule in the right lower  lobe  03/27/2018-CT chest without contrast- emphysema, upper lobe predominant, no pulmonary nodule identified, trace left pleural effusion       Endotracheally intubated   Tracheobronchitis   OSA (obstructive sleep apnea)    05/30/18 with moderate obstructive sleep apnea.  AHI 15.2, SpO2 low 82%.          No Known Allergies  Immunization History  Administered Date(s) Administered  . Influenza, High Dose Seasonal PF 07/08/2017, 06/07/2018  . Tdap 03/13/2017    Past Medical History:  Diagnosis Date  . Anginal pain  ([place])    with exertion  . Coronary artery disease   . Critical aortic valve stenosis   . Dyspnea    with exertion  . 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 Murmur   . Seizure ([place])   . Stroke ([place])   . Syncope     Tobacco History: Social History   Tobacco Use  Smoking Status Former Smoker  . Packs/day: 2.00  . Years: 64.00  . Pack years: 128.00  . Types: Cigarettes  . Last attempt to quit: 07/04/2017  . Years since quitting: 1.1  Smokeless Tobacco Never Used  Tobacco Comment   SINCE I WAS 77 YRS OLD   Counseling given: Not Answered Comment: SINCE I WAS 77 YRS OLD   Outpatient Encounter Medications as of 08/19/2018  Medication Sig  . acetaminophen (TYLENOL) 325 MG tablet Take 325 mg by mouth every 6 (six) hours as needed for mild pain, fever or headache.   . albuterol (PROVENTIL HFA;VENTOLIN HFA) 108 (90 Base) MCG/ACT inhaler Inhale 1-2 puffs into the lungs every 6 (six) hours as needed for wheezing or shortness of breath.  Marland Kitchen atorvastatin (LIPITOR) 40 MG tablet Take 1 tablet (40 mg total) by mouth daily.  . bethanechol (URECHOLINE) 5 MG tablet Take 5 mg by mouth 3 (three) times daily.  . budesonide-formoterol (SYMBICORT) 160-4.5 MCG/ACT inhaler Inhale 2 puffs into the lungs 2 (two) times daily.  . famotidine (PEPCID) 20 MG tablet Take 1 tablet (20 mg total) by mouth 2 (two) times daily.  . ferrous sulfate 325 (65 FE) MG tablet Take 1 tablet (325 mg total) by mouth daily with breakfast.  . hydrALAZINE (APRESOLINE) 10 MG tablet Take 1 tablet (10 mg total) by mouth 3 (three) times daily.  Marland Kitchen levETIRAcetam (KEPPRA) 750 MG tablet One tablet in the morning and 2 in the evening  . losartan (COZAAR) 100 MG tablet Take 1 tablet (100 mg total) by mouth daily.  . Multiple Vitamins-Minerals (MULTIVITAMIN ADULTS PO) Take 1 tablet by mouth daily.  . phenytoin (DILANTIN) 100 MG ER capsule Take 1 capsule (100 mg total) by mouth 3 (three) times daily.  . QUEtiapine (SEROQUEL) 25 MG tablet  Take 1 tablet (25 mg total) by mouth See admin instructions. 25mg  in the am and 50mg  in the pm  . sennosides-docusate sodium (SENOKOT-S) 8.6-50 MG tablet Take 1 tablet by mouth daily.  . sertraline (ZOLOFT) 50 MG tablet Take 1 tablet (50 mg total) by mouth daily.  Marland Kitchen Spacer/Aero-Holding Dorise Bullion Use with your symbicort and your rescue inhaler  . topiramate (TOPAMAX) 50 MG tablet Begin 3 tablets in the morning and 4 in the evening, taper by 1 tablet every 2 weeks until off the medication.   No facility-administered encounter medications on file as of 08/19/2018.      Review of Systems  Review of Systems   Physical Exam  There were no vitals taken for this visit.  Wt Readings from Last  5 Encounters:  07/08/18 138 lb 3.2 oz (62.7 kg)  05/02/18 138 lb (62.6 kg)  04/10/18 139 lb 3.2 oz (63.1 kg)  03/27/18 160 lb 0.9 oz (72.6 kg)  01/31/18 147 lb 11.3 oz (67 kg)     Physical Exam    Lab Results:  CBC    Component Value Date/Time   WBC 6.3 03/29/2018 0352   RBC 3.55 (L) 03/29/2018 0352   HGB 9.4 (L) 03/29/2018 0352   HGB 9.6 (L) 07/19/2017 1043   HCT 31.3 (L) 03/29/2018 0352   HCT 31.2 (L) 07/19/2017 1043   PLT 275 03/29/2018 0352   PLT 556 (H) 07/19/2017 1043   MCV 88.2 03/29/2018 0352   MCV 83 07/19/2017 1043   MCH 26.5 03/29/2018 0352   MCHC 30.0 03/29/2018 0352   RDW 17.0 (H) 03/29/2018 0352   RDW 15.3 07/19/2017 1043   LYMPHSABS 1.2 03/29/2018 0352   MONOABS 0.7 03/29/2018 0352   EOSABS 0.5 03/29/2018 0352   BASOSABS 0.0 03/29/2018 0352    BMET    Component Value Date/Time   NA 138 03/29/2018 0352   NA 139 07/19/2017 1043   K 4.3 03/29/2018 0352   CL 106 03/29/2018 0352   CO2 25 03/29/2018 0352   GLUCOSE 90 03/29/2018 0352   BUN 12 03/29/2018 0352   BUN 18 07/19/2017 1043   CREATININE 0.99 03/29/2018 0352   CALCIUM 8.8 (L) 03/29/2018 0352   GFRNONAA >60 03/29/2018 0352   GFRAA >60 03/29/2018 0352    BNP No results found for: BNP  ProBNP No  results found for: PROBNP    Assessment & Plan:     No problem-specific Assessment & Plan notes found for this encounter.     Lauraine Rinne, NP 08/19/2018

## 2018-08-21 ENCOUNTER — Telehealth: Payer: Self-pay | Admitting: Pulmonary Disease

## 2018-08-21 MED ORDER — BUDESONIDE-FORMOTEROL FUMARATE 160-4.5 MCG/ACT IN AERO: 2.0000 | INHALATION_SPRAY | Freq: Two times a day (BID) | RESPIRATORY_TRACT | 0 refills | Status: DC

## 2018-08-21 NOTE — Telephone Encounter (Signed)
Spoke with pt's son and advised him that I would leave samples of Symbicort for him at the front desk. He stated he would come by and pick them up tomorrow. Nothing further is needed.

## 2018-08-23 ENCOUNTER — Other Ambulatory Visit: Payer: Self-pay | Admitting: Neurology

## 2018-09-22 NOTE — Progress Notes (Signed)
@Patient  ID: Danny Krause, male    DOB: 1940-11-03, 78 y.o.   MRN: [id number]  Chief Complaint  Patient presents with  . Follow-up    Obstructive sleep apnea and COPD follow-up    Referring provider: Jani Gravel, MD  HPI:  78 year old male former smoker (quit 06/2017) initially referred to our office in 2019 for dyspnea and COPD.  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  Quit 06/2017.  128-pack-year smoking history. Maintenance: Symbicort 160 Pt of: [place]  09/23/2018  - Visit   78 year old male former smoker presenting to our office today for a follow-up to go over his CPAP compliance.  Patient reports that he has not noticed a significant improvement regarding his symptoms.  Epworth today is decreased at 1.  Son who is with patient today reports that he is napping less during the day and seems more alert.  Patient has excellent compliance in regards to his CPAP.  CPAP compliance report showing 30 out of 30 days used.  All 30 those days greater than 4 hours.  Average usage 9 hours and 19 minutes.  APAP settings 5-15.  AHI 4.4.  Patient continues to take his Symbicort 160 regularly.  Patient reports that shortness of breath has been minimal.  MMRC - Breathlessness Score 1 - I get short of breath when hurrying on level ground or walking up a slight hill    Tests:   05/30/18 with moderate obstructive sleep apnea.  AHI 15.2, SpO2 low 82%.  03/27/2018-CT chest without contrast- emphysema, upper lobe predominant, no pulmonary nodule identified, trace left pleural effusion  02/11/2018-swallow study- dysphagia, mild aspiration risk  PFT: 06/2017 : FEV1 1.41L (53% pre), DLCO 8.35 ml (29%)  Imaging:  12/20/2017-chest x-ray- mild right basilar atelectasis 07/02/2017-CT Angio chest with contrast- no evidence of thoracic aortic aneurysm, aortic arthrosclerosis, 1.3 cm solid nodule in left lower lobe, 1.7 cm solid  nodule in the right lower lobe  Cardiac:  12/19/2017-echocardiogram- LV ejection fraction 55 to 44%, grade 1 diastolic dysfunction  Results of the Epworth flowsheet 09/23/2018 01/10/2018  Sitting and reading 0 1  Watching TV 1 3  Sitting, inactive in a public place (e.g. a theatre or a meeting) 0 0  As a passenger in a car for an hour without a break 0 0  Lying down to rest in the afternoon when circumstances permit 0 0  Sitting and talking to someone 0 0  Sitting quietly after a lunch without alcohol 0 0  In a car, while stopped for a few minutes in traffic 0 0  Total score 1 4     FENO:  No results found for: NITRICOXIDE  PFT: PFT Results Latest Ref Rng & Units 07/03/2017  FVC-Pre L 3.19  FVC-Predicted Pre % 86  FVC-Post L 4.03  FVC-Predicted Post % 109  Pre FEV1/FVC % % 44  Post FEV1/FCV % % 43  FEV1-Pre L 1.41  FEV1-Predicted Pre % 53  FEV1-Post L 1.72  DLCO UNC% % 29  DLCO COR %Predicted % 48  TLC L 8.09  TLC % Predicted % 125  RV % Predicted % 186    Imaging: No results found.    Specialty Problems      Pulmonary Problems   COPD, severe ([place])    PFT: 06/2017 : FEV1 1.41L (53% pre), DLCO 8.35 ml (29%)  03/27/2018-CT chest without contrast- emphysema, upper lobe predominant, no pulmonary nodule identified, trace left pleural effusion  Acute respiratory failure (HCC)   Acute respiratory failure with hypoxia and hypercarbia (HCC)   Lung nodule seen on imaging study    07/02/2017-CT Angio chest with contrast- no evidence of thoracic aortic aneurysm, aortic arthrosclerosis, 1.3 cm solid nodule in left lower lobe, 1.7 cm solid nodule in the right lower lobe  03/27/2018-CT chest without contrast- emphysema, upper lobe predominant, no pulmonary nodule identified, trace left pleural effusion       Endotracheally intubated   Tracheobronchitis   OSA (obstructive sleep apnea)    05/30/18 with moderate obstructive sleep apnea.  AHI 15.2, SpO2 low 82%.            No Known Allergies  Immunization History  Administered Date(s) Administered  . Influenza, High Dose Seasonal PF 07/08/2017, 06/07/2018  . Tdap 03/13/2017   Pneumonia vaccine in 2018  Past Medical History:  Diagnosis Date  . Anginal pain ([place])    with exertion  . Coronary artery disease   . Critical aortic valve stenosis   . Dyspnea    with exertion  . 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 Murmur   . Seizure ([place])   . Stroke ([place])   . Syncope     Tobacco History: Social History   Tobacco Use  Smoking Status Former Smoker  . Packs/day: 2.00  . Years: 64.00  . Pack years: 128.00  . Types: Cigarettes  . Last attempt to quit: 07/04/2017  . Years since quitting: 1.2  Smokeless Tobacco Never Used  Tobacco Comment   SINCE I WAS 78 YRS OLD   Counseling given: Not Answered Comment: SINCE I WAS 78 YRS OLD  Continue to not smoke  Outpatient Encounter Medications as of 09/23/2018  Medication Sig  . acetaminophen (TYLENOL) 325 MG tablet Take 325 mg by mouth every 6 (six) hours as needed for mild pain, fever or headache.   . albuterol (PROVENTIL HFA;VENTOLIN HFA) 108 (90 Base) MCG/ACT inhaler Inhale 1-2 puffs into the lungs every 6 (six) hours as needed for wheezing or shortness of breath.  . bethanechol (URECHOLINE) 5 MG tablet Take 5 mg by mouth 3 (three) times daily.  . budesonide-formoterol (SYMBICORT) 160-4.5 MCG/ACT inhaler Inhale 2 puffs into the lungs 2 (two) times daily.  Marland Kitchen levETIRAcetam (KEPPRA) 750 MG tablet One tablet in the morning and 2 in the evening  . Multiple Vitamins-Minerals (MULTIVITAMIN ADULTS PO) Take 1 tablet by mouth daily.  . phenytoin (DILANTIN) 100 MG ER capsule TAKE 1 CAPSULE(100 MG) BY MOUTH THREE TIMES DAILY  . sennosides-docusate sodium (SENOKOT-S) 8.6-50 MG tablet Take 1 tablet by mouth daily.  Marland Kitchen Spacer/Aero-Holding Dorise Bullion Use with your symbicort and your rescue inhaler  . topiramate (TOPAMAX) 200 MG tablet TAKE 1 TABLET(200 MG) BY  MOUTH TWICE DAILY  . topiramate (TOPAMAX) 50 MG tablet Begin 3 tablets in the morning and 4 in the evening, taper by 1 tablet every 2 weeks until off the medication.  Marland Kitchen atorvastatin (LIPITOR) 40 MG tablet Take 1 tablet (40 mg total) by mouth daily.  . budesonide-formoterol (SYMBICORT) 160-4.5 MCG/ACT inhaler Inhale 2 puffs into the lungs 2 (two) times daily.  . famotidine (PEPCID) 20 MG tablet Take 1 tablet (20 mg total) by mouth 2 (two) times daily.  . ferrous sulfate 325 (65 FE) MG tablet Take 1 tablet (325 mg total) by mouth daily with breakfast.  . hydrALAZINE (APRESOLINE) 10 MG tablet Take 1 tablet (10 mg total) by mouth 3 (three) times daily.  Marland Kitchen losartan (COZAAR) 100 MG tablet Take  1 tablet (100 mg total) by mouth daily.  . QUEtiapine (SEROQUEL) 25 MG tablet Take 1 tablet (25 mg total) by mouth See admin instructions. 25mg  in the am and 50mg  in the pm  . sertraline (ZOLOFT) 50 MG tablet Take 1 tablet (50 mg total) by mouth daily.   No facility-administered encounter medications on file as of 09/23/2018.      Review of Systems  Review of Systems  Constitutional: Negative for fatigue.  HENT: Negative for congestion and sore throat.   Respiratory: Positive for cough and shortness of breath. Negative for wheezing.   Cardiovascular: Negative for chest pain and palpitations.  Gastrointestinal: Negative for diarrhea, nausea and vomiting.  Musculoskeletal: Negative for arthralgias.  Psychiatric/Behavioral: Negative for sleep disturbance.     Physical Exam  BP (!) 102/58 (BP Location: Left Arm, Cuff Size: Normal)   Pulse 70   Ht 5' 5.5" (1.664 m)   Wt 142 lb 6.4 oz (64.6 kg)   SpO2 96%   BMI 23.34 kg/m   Wt Readings from Last 5 Encounters:  09/23/18 142 lb 6.4 oz (64.6 kg)  07/08/18 138 lb 3.2 oz (62.7 kg)  05/02/18 138 lb (62.6 kg)  04/10/18 139 lb 3.2 oz (63.1 kg)  03/27/18 160 lb 0.9 oz (72.6 kg)    Physical Exam  Constitutional: He is oriented to person, place, and time  and well-developed, well-nourished, and in no distress. Vital signs are normal. No distress.  HENT:  Head: Normocephalic and atraumatic.  Right Ear: Hearing, tympanic membrane, external ear and ear canal normal.  Left Ear: Hearing, tympanic membrane, external ear and ear canal normal.  Nose: Nose normal. Right sinus exhibits no maxillary sinus tenderness and no frontal sinus tenderness. Left sinus exhibits no maxillary sinus tenderness and no frontal sinus tenderness.  Mouth/Throat: Uvula is midline and oropharynx is clear and moist. He has dentures (Upper and bottom). No oropharyngeal exudate.  Eyes: Pupils are equal, round, and reactive to light.  Neck: Normal range of motion. Neck supple.  Cardiovascular: Normal rate, regular rhythm and normal heart sounds.  Pulmonary/Chest: Effort normal and breath sounds normal. No accessory muscle usage. No respiratory distress. He has no decreased breath sounds. He has no wheezes. He has no rhonchi.  + Distant breath sounds  Abdominal: Soft. Bowel sounds are normal. There is no abdominal tenderness.  Musculoskeletal: Normal range of motion.        General: No edema.  Lymphadenopathy:    He has no cervical adenopathy.  Neurological: He is alert and oriented to person, place, and time. Gait normal.  Skin: Skin is warm and dry. He is not diaphoretic. No erythema.  Psychiatric: Mood, memory, affect and judgment normal.  Nursing note and vitals reviewed.    Lab Results:  CBC    Component Value Date/Time   WBC 6.3 03/29/2018 0352   RBC 3.55 (L) 03/29/2018 0352   HGB 9.4 (L) 03/29/2018 0352   HGB 9.6 (L) 07/19/2017 1043   HCT 31.3 (L) 03/29/2018 0352   HCT 31.2 (L) 07/19/2017 1043   PLT 275 03/29/2018 0352   PLT 556 (H) 07/19/2017 1043   MCV 88.2 03/29/2018 0352   MCV 83 07/19/2017 1043   MCH 26.5 03/29/2018 0352   MCHC 30.0 03/29/2018 0352   RDW 17.0 (H) 03/29/2018 0352   RDW 15.3 07/19/2017 1043   LYMPHSABS 1.2 03/29/2018 0352   MONOABS  0.7 03/29/2018 0352   EOSABS 0.5 03/29/2018 0352   BASOSABS 0.0 03/29/2018 0352  BMET    Component Value Date/Time   NA 138 03/29/2018 0352   NA 139 07/19/2017 1043   K 4.3 03/29/2018 0352   CL 106 03/29/2018 0352   CO2 25 03/29/2018 0352   GLUCOSE 90 03/29/2018 0352   BUN 12 03/29/2018 0352   BUN 18 07/19/2017 1043   CREATININE 0.99 03/29/2018 0352   CALCIUM 8.8 (L) 03/29/2018 0352   GFRNONAA >60 03/29/2018 0352   GFRAA >60 03/29/2018 0352    BNP No results found for: BNP  ProBNP No results found for: PROBNP    Assessment & Plan:    OSA (obstructive sleep apnea) Assessment: Mild obstructive sleep apnea on September/2019 Home sleep study Excellent compliance on CPAP use Mallampati 1 Epworth 1  Plan: Continue CPAP as prescribed Follow-up in 6 months   COPD, severe ([place]) Assessment: October/2018 pulmonary function test shows a FEV1 of 1.41 L, DLCO 29% July/2019 CT without contrast shows emphysema Lungs clear on auscultation today Diminished breath sounds throughout exam mMRC 1  Plan: Symbicort 160 twice daily as prescribed Follow-up in 6 months or sooner if symptoms worsen Up-to-date on flu and pneumonia Could consider pulmonary rehab referral in the future if patient would like, he defers at this time     Lauraine Rinne, NP 09/23/2018   This appointment was 32 min long with over 50% of the time in direct face-to-face patient care, assessment, plan of care, and follow-up.

## 2018-09-23 ENCOUNTER — Telehealth: Payer: Self-pay | Admitting: Pulmonary Disease

## 2018-09-23 ENCOUNTER — Encounter: Payer: Self-pay | Admitting: Pulmonary Disease

## 2018-09-23 ENCOUNTER — Ambulatory Visit (INDEPENDENT_AMBULATORY_CARE_PROVIDER_SITE_OTHER): Payer: Self-pay | Admitting: Pulmonary Disease

## 2018-09-23 DIAGNOSIS — G4733 Obstructive sleep apnea (adult) (pediatric): Secondary | ICD-10-CM

## 2018-09-23 DIAGNOSIS — J449 Chronic obstructive pulmonary disease, unspecified: Secondary | ICD-10-CM

## 2018-09-23 MED ORDER — BUDESONIDE-FORMOTEROL FUMARATE 160-4.5 MCG/ACT IN AERO: 2.0000 | INHALATION_SPRAY | Freq: Two times a day (BID) | RESPIRATORY_TRACT | 0 refills | Status: DC

## 2018-09-23 NOTE — Telephone Encounter (Signed)
ATC patient unable to reach. Patient stated he received the PNA vaccine but did not specify if it was 13 or 23. Left message for patient to call back

## 2018-09-23 NOTE — Patient Instructions (Addendum)
Continue Symbicort 160 >>> 2 puffs in the morning right when you wake up, rinse out your mouth after use, 12 hours later 2 puffs, rinse after use >>> Take this daily, no matter what >>> This is not a rescue inhaler   [place] for me paperwork given today    We recommend that you continue using your CPAP daily >>>Keep up the hard work using your device >>> Goal should be wearing this for the entire night that you are sleeping, at least 4 to 6 hours  Remember:  . Do not drive or operate heavy machinery if tired or drowsy.  . Please notify the supply company and office if you are unable to use your device regularly due to missing supplies or machine being broken.  . Work on maintaining a healthy weight and following your recommended nutrition plan  . Maintain proper daily exercise and movement  . Maintaining proper use of your device can also help improve management of other chronic illnesses such as: Blood pressure, blood sugars, and weight management.   BiPAP/ CPAP Cleaning:  >>>Clean weekly, with Dawn soap, and bottle brush.  Set up to air dry.      Follow up in 6 months with Dr [place] Bells or sooner if symptoms worsen      It is flu season:   >>>Remember to be washing your hands regularly, using hand sanitizer, be careful to use around herself with has contact with people who are sick will increase her chances of getting sick yourself. >>> Best ways to protect herself from the flu: Receive the yearly flu vaccine, practice good hand hygiene washing with soap and also using hand sanitizer when available, eat a nutritious meals, get adequate rest, hydrate appropriately   Please contact the office if your symptoms worsen or you have concerns that you are not improving.   Thank you for choosing [facility] for your healthcare, and for allowing [place] to partner with you on your healthcare journey. I am thankful to be able to provide care to you today.   Wyn Quaker FNP-C

## 2018-09-23 NOTE — Assessment & Plan Note (Addendum)
Assessment: October/2018 pulmonary function test shows a FEV1 of 1.41 L, DLCO 29% July/2019 CT without contrast shows emphysema Lungs clear on auscultation today Diminished breath sounds throughout exam mMRC 1  Plan: Symbicort 160 twice daily as prescribed Follow-up in 6 months or sooner if symptoms worsen Up-to-date on flu and pneumonia Could consider pulmonary rehab referral in the future if patient would like, he defers at this time

## 2018-09-23 NOTE — Assessment & Plan Note (Signed)
Assessment: Mild obstructive sleep apnea on September/2019 Home sleep study Excellent compliance on CPAP use Mallampati 1 Epworth 1  Plan: Continue CPAP as prescribed Follow-up in 6 months

## 2018-09-24 NOTE — Telephone Encounter (Signed)
Thank you   Danny Krause  

## 2018-09-24 NOTE — Telephone Encounter (Signed)
Called and spoke with Patient. He is unsure which vaccine he received for pneumonia. He said he received it at [facility] on [address].   I called pharmacy on [address] to determine which vaccine was received by patient. I was told Prevnar 13 was given 2017. Our records were updated.   Will route to [place] as FYI  Nothing further needed at this time

## 2018-09-25 NOTE — Progress Notes (Signed)
Reviewed, agree 

## 2018-10-07 ENCOUNTER — Ambulatory Visit (INDEPENDENT_AMBULATORY_CARE_PROVIDER_SITE_OTHER): Payer: PPO | Admitting: Internal Medicine

## 2018-10-07 ENCOUNTER — Encounter: Payer: Self-pay | Admitting: Internal Medicine

## 2018-10-07 VITALS — BP 114/66 | HR 67 | Ht 65.0 in | Wt 143.0 lb

## 2018-10-07 DIAGNOSIS — J449 Chronic obstructive pulmonary disease, unspecified: Secondary | ICD-10-CM | POA: Diagnosis not present

## 2018-10-07 DIAGNOSIS — E785 Hyperlipidemia, unspecified: Secondary | ICD-10-CM

## 2018-10-07 DIAGNOSIS — Z951 Presence of aortocoronary bypass graft: Secondary | ICD-10-CM | POA: Diagnosis not present

## 2018-10-07 DIAGNOSIS — Z952 Presence of prosthetic heart valve: Secondary | ICD-10-CM | POA: Diagnosis not present

## 2018-10-07 NOTE — Patient Instructions (Signed)
Medication Instructions:  DECREASE aspirin to 81mg  daily OK to drink Boost or Ensure  If you need a refill on your cardiac medications before your next appointment, please call your pharmacy.   Follow-Up: At [facility], you and your health needs are our priority.  As part of our continuing mission to provide you with exceptional heart care, we have created designated Provider Care Teams.  These Care Teams include your primary Cardiologist (physician) and Advanced Practice Providers (APPs -  Physician Assistants and Nurse Practitioners) who all work together to provide you with the care you need, when you need it. You will need a follow up appointment in 6 months.  Please call our office 2 months in advance to schedule this appointment.  You may see Pixie Casino, MD or one of the following Advanced Practice Providers on your designated Care Team: [place], [place] . Fabian Sharp, PA-C  Any Other Special Instructions Will Be Listed Below (If Applicable).

## 2018-10-07 NOTE — Progress Notes (Signed)
OFFICE CONSULT NOTE  Chief Complaint: Routine follow-up  Primary Care Physician: Jani Gravel, MD  HPI:  Danny Krause is a 78 y.o. male who is being seen today for the evaluation of murmur at the request of Dr. Jani Gravel. Danny Krause is a pleasant 78 yo male whose wife is a patient of mine. Recently had a syncopal episode. He was doing some work outside in [employer]. He apparently passed out although this was unwitnessed. He was found down and thought possibly had a stroke. When EMS arrived they were planning to take him to the hospital and then he had a seizure. He apparently had a status post episode and eventually was broken of that seizure. He was placed on Keppra and has a follow-up with the neurologist next month. He denies any chest pain but does get short of breath. He is a 50 pack year smoker and continues to smoke. He reports when he was in his 56s he had workup for chest pain which included an evaluation that demonstrated a heart murmur. He was told that he should take antibiotics prior to dental visits but nothing more was made out of it. He did not follow-up with a doctor since then. He's not a fan of doctors, although we have significantly helped his wife.  06/01/2017  Danny Krause returns today for follow-up. He reports his wife is actually back in the hospital now. He seems to be under a lot of stress. Unfortunately the echo demonstrated severe aortic stenosis. This likely the cause of his syncopal episode and seizure. Mean gradient across the aortic valve is 87 mmHg with a calculated aortic valve area 0.4 cm. LVEF is 55-60% with normal wall motion and grade 1 diastolic dysfunction. I discussed this with him today and he seemed quite upset with the news. He understands that he will need aortic valve surgery. Given his lack of other comorbidities, I think EF she would be a good candidate for traditional aortic valve replacement, likely with a bioprosthetic valve. He will  need a heart catheterization prior to this. Right now since his wife's in the hospital, he wants to wait to schedule it until next week.  09/21/2017  Danny Krause returns today for hospital follow-up.  He reports worsening shortness of breath.  He has recently had a productive cough and rattle.  He has a history of smoking and recently stopped with his surgery.  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  He is on no therapy for this.  He is not had follow-up with his primary care provider.  He seems somewhat upset with inability to make an appointment with the office and is requesting names of other primary care providers for me today.  He denies any wheezing but gets short of breath with exertion and has a nonproductive cough.  He denies any significant weight gain, orthopnea or lower extremity edema.  He had an uneventful cardiac surgery and underwent aortic valve replacement and three-vessel coronary artery bypass grafting by Dr. Servando Snare on 07/04/2017, with placement of a 23 mm [employer] pericardial tissue valve and LIMA to the LAD, SVG to the diagonal and SVG to the distal right coronary.  He denies any anginal symptoms.  Postoperative echocardiography demonstrated normal valve function.  Danny Krause is significantly anxious and says he is not sleeping well at night.  His wife is been ill and is back in the hospital again with a leg infection.  He's concerned that she may need amputation.  12/26/2017  Danny Krause returns today for hospital follow-up.  Unfortunately recently had an episode of seizure.  This was likely due to hypercarbic respiratory failure.  His PCO2 was apparently over 100 and took several days to normalize.  He was intubated and eventually weaned off of the ventilator.  He had seizure activity as well and his Keppra has been increased.  He has a follow-up with pulmonary to see Dr. Lake Bells in May.  From a cardiac  standpoint he is been very stable after placement of his pericardial tissue valve and multivessel bypass.  He denies any recurrent chest pain.  Blood pressure is elevated today however at 177/85.  His son says his blood pressures been persistently elevated over the past several weeks.  04/10/2018  Danny Krause returns today for follow-up.  Unfortunately is been hospitalized several times since I last saw him.  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  He is currently on 4 antiepileptic medications.  The focus of his seizure seems to be from his stroke which he suffered in conjunction with severe aortic stenosis.  He subsequently undergone bioprosthetic AVR and coronary artery bypass grafting in 06/2017, which has been stable.  He has no complaints of chest pain or shortness of breath.  Blood pressure initially was elevated today 169/82 but came down to 124/86 and a recheck.  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  He is not had a recent lipid profile and is on low-dose atorvastatin 10 mg.  10/07/2018  Danny Krause returns today for follow-up.  He is done well medically from a cardiac standpoint.  He denies any chest pain or significant worsening shortness of breath.  He does have COPD and occasionally has days where he is somewhat more breathless and wheezy.  He seems to have had his seizures under control now.  He is on 3 medications for that and is followed by Dr. Jannifer Franklin.  EKG today shows sinus rhythm with first-degree AV block at 65.  His last echo in April 2019 showed normal LV function with a normally functioning bioprosthetic AV valve and acceptable gradients.  Recent lipid profile does show an improvement with total cholesterol of 166, triglycerides 131, HDL 50 and LDL of 90.  This is partially due to medication and the fact that he has had weight loss due to eating less.  He is also grieving at the loss of his wife who is also a  patient of mine in October of last year.  PMHx:  Past Medical History:  Diagnosis Date  . Anginal pain ([place])    with exertion  . Coronary artery disease   . Critical aortic valve stenosis   . Dyspnea    with exertion  . 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 Murmur   . Seizure ([place])   . Stroke ([place])   . Syncope     Past Surgical History:  Procedure Laterality Date  . AORTIC VALVE REPLACEMENT N/A 07/04/2017   Procedure: AORTIC VALVE REPLACEMENT (AVR);  Surgeon: Grace Isaac, MD;  Location: [facility];  Service: Open Heart Surgery;  Laterality: N/A;  . CORONARY ARTERY BYPASS GRAFT N/A 07/04/2017   Procedure: CORONARY ARTERY BYPASS GRAFTING (CABG) x three, using left internal mammary artery and right leg greater saphenous vein harvested endoscopically;  Surgeon: Grace Isaac, MD;  Location: [facility];  Service: Open Heart Surgery;  Laterality: N/A;  .  NO PAST SURGERIES    . RIGHT/LEFT HEART CATH AND CORONARY ANGIOGRAPHY N/A 06/25/2017   Procedure: RIGHT/LEFT HEART CATH AND CORONARY ANGIOGRAPHY;  Surgeon: Troy Sine, MD;  Location: [facility];  Service: Cardiovascular;  Laterality: N/A;  . TEE WITHOUT CARDIOVERSION N/A 07/04/2017   Procedure: TRANSESOPHAGEAL ECHOCARDIOGRAM (TEE);  Surgeon: Grace Isaac, MD;  Location: [facility];  Service: Open Heart Surgery;  Laterality: N/A;  . THORACIC AORTOGRAM  06/25/2017   Procedure: THORACIC AORTOGRAM;  Surgeon: Troy Sine, MD;  Location: [facility];  Service: Cardiovascular;;  aortic root   . TRACHEOSTOMY CLOSURE  02/2018    FAMHx:  Family History  Problem Relation Age of Onset  . Hernia Father   . Stroke Brother     SOCHx:   reports that he quit smoking about 15 months ago. His smoking use included cigarettes. He has a 128.00 pack-year smoking history. He has never used smokeless tobacco. He reports that he does not drink alcohol or use drugs.  ALLERGIES:  No Known Allergies  ROS: Pertinent items noted  in HPI and remainder of comprehensive ROS otherwise negative.  HOME MEDS: Current Outpatient Medications on File Prior to Visit  Medication Sig Dispense Refill  . acetaminophen (TYLENOL) 325 MG tablet Take 325 mg by mouth every 6 (six) hours as needed for mild pain, fever or headache.     . albuterol (PROVENTIL HFA;VENTOLIN HFA) 108 (90 Base) MCG/ACT inhaler Inhale 1-2 puffs into the lungs every 6 (six) hours as needed for wheezing or shortness of breath.    Marland Kitchen atorvastatin (LIPITOR) 40 MG tablet Take 1 tablet (40 mg total) by mouth daily. 90 tablet 3  . bethanechol (URECHOLINE) 5 MG tablet Take 5 mg by mouth 3 (three) times daily.    . budesonide-formoterol (SYMBICORT) 160-4.5 MCG/ACT inhaler Inhale 2 puffs into the lungs 2 (two) times daily. 2 Inhaler 0  . ferrous sulfate 325 (65 FE) MG tablet Take 1 tablet (325 mg total) by mouth daily with breakfast. 30 tablet 0  . hydrALAZINE (APRESOLINE) 10 MG tablet Take 1 tablet (10 mg total) by mouth 3 (three) times daily. 270 tablet 3  . levETIRAcetam (KEPPRA) 750 MG tablet One tablet in the morning and 2 in the evening 120 tablet 5  . Multiple Vitamins-Minerals (MULTIVITAMIN ADULTS PO) Take 1 tablet by mouth daily.    . phenytoin (DILANTIN) 100 MG ER capsule TAKE 1 CAPSULE(100 MG) BY MOUTH THREE TIMES DAILY 90 capsule 3  . sennosides-docusate sodium (SENOKOT-S) 8.6-50 MG tablet Take 1 tablet by mouth daily.    Marland Kitchen Spacer/Aero-Holding Dorise Bullion Use with your symbicort and your rescue inhaler 1 each 0  . aspirin 325 MG tablet Take 325 mg by mouth daily.    . famotidine (PEPCID) 20 MG tablet Take 1 tablet (20 mg total) by mouth 2 (two) times daily. 60 tablet 0  . losartan (COZAAR) 100 MG tablet Take 1 tablet (100 mg total) by mouth daily. 90 tablet 3  . QUEtiapine (SEROQUEL) 25 MG tablet Take 1 tablet (25 mg total) by mouth See admin instructions. 25mg  in the am and 50mg  in the pm 60 tablet 0  . sertraline (ZOLOFT) 50 MG tablet Take 1 tablet (50 mg  total) by mouth daily. 30 tablet 0   No current facility-administered medications on file prior to visit.     LABS/IMAGING: No results found for this or any previous visit (from the past 48 hour(s)). No results found.  LIPID  PANEL:    Component Value Date/Time   CHOL 166 04/10/2018 0926   TRIG 131 04/10/2018 0926   HDL 50 04/10/2018 0926   CHOLHDL 3.3 04/10/2018 0926   CHOLHDL 4.2 03/13/2017 2338   VLDL 15 03/13/2017 2338   LDLCALC 90 04/10/2018 0926    WEIGHTS: Wt Readings from Last 3 Encounters:  10/07/18 143 lb (64.9 kg)  09/23/18 142 lb 6.4 oz (64.6 kg)  07/08/18 138 lb 3.2 oz (62.7 kg)    VITALS: BP 114/66   Pulse 67   Ht 5\' 5"  (1.651 m)   Wt 143 lb (64.9 kg)   BMI 23.80 kg/m   EXAM: General appearance: alert and no distress Neck: no carotid bruit, no JVD and thyroid not enlarged, symmetric, no tenderness/mass/nodules Lungs: diminished breath sounds bilaterally Heart: regular rate and rhythm Abdomen: soft, non-tender; bowel sounds normal; no masses,  no organomegaly Extremities: extremities normal, atraumatic, no cyanosis or edema Pulses: 2+ and symmetric Skin: Skin color, texture, turgor normal. No rashes or lesions Neurologic: Grossly normal Psych: Anxious  EKG: Sinus rhythm first-degree AV block at 65, nonspecific IVCD-personally reviewed  ASSESSMENT: 1. Aortic stenosis status post 23 mm [employer] pericardial tissue valve (07/2017) 2. Coronary artery disease status post three-vessel CABG with LIMA to LAD, SVG to diagonal, SVG to RCA (07/2017) 3. Dyspnea-likely secondary to COPD 4. Abnormal EKG suggesting pulmonary disease pattern 5. Recent syncopal episode 6. Seizures 7. Hypercarbic respiratory failure 8. Hypertension  PLAN: 1.   Danny. Burbach has had stable symptoms without worsening chest pain or significant shortness of breath.  He does have COPD and occasionally has respiratory difficulty with that.  Blood pressure is well controlled.  His  cholesterol is improved significantly.  He is grieving over the loss of his wife and has decreased his appetite and may be struggling with some depressive symptoms.  I encouraged him to use his supplemental protein shake daily as needed for weight loss.  Follow-up 6 months.  Pixie Casino, MD, [facility], [place] Director of the Advanced Lipid Disorders &  Cardiovascular Risk Reduction Clinic Diplomate of the American Board of Clinical Lipidology Attending Cardiologist  Direct Dial: [phone]  Fax: [phone]  Website:  [email or web address] 10/07/2018, 12:18 PM

## 2018-10-22 DIAGNOSIS — R4182 Altered mental status, unspecified: Secondary | ICD-10-CM | POA: Diagnosis not present

## 2018-10-22 DIAGNOSIS — R569 Unspecified convulsions: Secondary | ICD-10-CM | POA: Diagnosis not present

## 2018-10-22 DIAGNOSIS — I5032 Chronic diastolic (congestive) heart failure: Secondary | ICD-10-CM | POA: Diagnosis not present

## 2018-10-24 ENCOUNTER — Telehealth: Payer: Self-pay | Admitting: Pulmonary Disease

## 2018-10-24 MED ORDER — BUDESONIDE-FORMOTEROL FUMARATE 160-4.5 MCG/ACT IN AERO: 2.0000 | INHALATION_SPRAY | Freq: Two times a day (BID) | RESPIRATORY_TRACT | 0 refills | Status: DC

## 2018-10-24 NOTE — Telephone Encounter (Signed)
Called spoke with patient's son Aaron Edelman. Verified patient needs symbicort 160. Two samples placed up front for pick up.  Nothing further needed.

## 2018-10-24 NOTE — Telephone Encounter (Signed)
Pt son returning call cb# [phone]

## 2018-10-24 NOTE — Telephone Encounter (Signed)
ATC son, unable to reach, left message to call back

## 2018-10-28 DIAGNOSIS — G4733 Obstructive sleep apnea (adult) (pediatric): Secondary | ICD-10-CM | POA: Diagnosis not present

## 2018-11-12 ENCOUNTER — Other Ambulatory Visit: Payer: Self-pay | Admitting: Neurology

## 2018-11-20 DIAGNOSIS — R4182 Altered mental status, unspecified: Secondary | ICD-10-CM | POA: Diagnosis not present

## 2018-11-20 DIAGNOSIS — R569 Unspecified convulsions: Secondary | ICD-10-CM | POA: Diagnosis not present

## 2018-11-20 DIAGNOSIS — I5032 Chronic diastolic (congestive) heart failure: Secondary | ICD-10-CM | POA: Diagnosis not present

## 2018-11-26 ENCOUNTER — Telehealth: Payer: Self-pay | Admitting: Neurology

## 2018-11-26 DIAGNOSIS — G4733 Obstructive sleep apnea (adult) (pediatric): Secondary | ICD-10-CM | POA: Diagnosis not present

## 2018-11-26 NOTE — Telephone Encounter (Signed)
I contacted the pt and pt's son (brian ok per dpr) and advised due to covid-19 concerns our office is limiting the number of patient's coming into the clinic. Our office is providing telephone visits during this time for our f/u's. The telephone visits will be billed through insurance and due to hippa concerns is not as secure as a face to face encounter. However, once a verbal consent is provided we can schedule this appointment.   Pt and pt's son agreed to a telephone visit on 11/28/18 at 3:30 pm. Pt confirmed the best call back number is [phone].   I reviewed the pt's medications, pharmacy and allergies.

## 2018-11-26 NOTE — Telephone Encounter (Signed)
[name] Danny Krause called to c/a 3/26 appt. I advised since the patient was a seizure patient to let RN call to discuss about c/a the appt and if a telemedicine call would be needed. Please LVM if he doesn't answer as he will call back

## 2018-11-28 ENCOUNTER — Other Ambulatory Visit: Payer: Self-pay

## 2018-11-28 ENCOUNTER — Ambulatory Visit (INDEPENDENT_AMBULATORY_CARE_PROVIDER_SITE_OTHER): Payer: PPO | Admitting: Neurology

## 2018-11-28 ENCOUNTER — Encounter: Payer: Self-pay | Admitting: Neurology

## 2018-11-28 DIAGNOSIS — Z5181 Encounter for therapeutic drug level monitoring: Secondary | ICD-10-CM | POA: Diagnosis not present

## 2018-11-28 DIAGNOSIS — R569 Unspecified convulsions: Secondary | ICD-10-CM | POA: Diagnosis not present

## 2018-11-28 MED ORDER — PHENYTOIN SODIUM EXTENDED 100 MG PO CAPS: ORAL_CAPSULE | ORAL | 3 refills | Status: DC

## 2018-11-28 NOTE — Progress Notes (Signed)
     Virtual Visit via Telephone Note  I connected with Danny Krause on 11/28/18 at  3:30 PM EDT by telephone and verified that I am speaking with the correct person using two identifiers.   I discussed the limitations, risks, security and privacy concerns of performing an evaluation and management service by telephone and the availability of in person appointments. I also discussed with the patient that there may be a patient responsible charge related to this service. The patient expressed understanding and agreed to proceed.   History of Present Illness: Danny Krause is a 78 year old right-handed white male with a history of seizures, he was placed on 4 different seizure medications coming out of hospital on 24 Jan 2018.  The patient had seizures with left-sided weakness and was in status epilepticus at that time.  He was treated with Dilantin, Vimpat, Keppra, and Topamax.  The patient was taken off of his Seroquel after he was seen in August 2019, the Vimpat was slowly discontinued and then the Topamax was tapered off.  The patient currently remains on Dilantin and Keppra.  The patient does have an occasional seizure, he has had about 2 seizures since last seen associated with brief right hand shaking unassociated with loss of consciousness.  The patient has gait instability, he uses a cane or a walker to get around, he has not had any falls.  The patient is on CPAP for sleep apnea, he is doing well with this.  He denies any side effects on the medication.  He does not operate a motor vehicle.   Observations/Objective: Over the telephone, the patient appears to be alert and cooperative and oriented.  The patient has normal speech, no aphasia or dysarthria is noted.  The interview was done with the son present.  Assessment and Plan: 1.  History of seizures  The patient is doing fairly well with his current medication regimen, but he has occasional brief focal seizures without loss of  consciousness.  He is not to operate a motor vehicle at this time.  We will get him into the office for blood work to check Keppra and Dilantin levels, we may increase the Dilantin dosing slightly if it remains low.  A prescription for the Dilantin was sent in.  Follow Up Instructions: The patient will follow-up in 4 months, may see nurse practitioner.   I discussed the assessment and treatment plan with the patient. The patient was provided an opportunity to ask questions and all were answered. The patient agreed with the plan and demonstrated an understanding of the instructions.   The patient was advised to call back or seek an in-person evaluation if the symptoms worsen or if the condition fails to improve as anticipated.  I provided 15 minutes of non-face-to-face time during this encounter.   Kathrynn Ducking, MD

## 2018-12-03 ENCOUNTER — Telehealth: Payer: Self-pay | Admitting: Neurology

## 2018-12-03 NOTE — Telephone Encounter (Addendum)
I contacted the pt's daughter [name]. She wanted to let Dr. Jannifer Franklin know the pt never tappered off the Topamax.  She states the pt has been taking dilantin 100mg  1 tablet 3 times daily, keppra 750 mg 1 in the am and 2 in the evening along with topamax (100mg  she believes unable to confirm due to being at work) 1 tab in the am and 1 in the pm.  [name] is wanting to confirm if the pt should still be weaned off the topamax. Pt did taper off vimpat and has been off since the end of October beginning of November.

## 2018-12-03 NOTE — Telephone Encounter (Signed)
I called back.  The patient has not yet had his blood work done, once the blood work is done, we will look at the Dilantin and Keppra levels, decide about tapering off of the Topamax, the patient apparently is still had some brief focal seizures on these 3 seizure medications.  I thought we had given instructions for the patient to come off of Topamax in the fall 2019.

## 2018-12-03 NOTE — Telephone Encounter (Signed)
Pt daughter(on DPR) is asking for a call from RN to discuss pt being weaned off of the Topiramate.  Daughter states no type of schedule has been set up to wean pt off of the medication

## 2018-12-17 ENCOUNTER — Other Ambulatory Visit: Payer: Self-pay | Admitting: Neurology

## 2018-12-21 DIAGNOSIS — I5032 Chronic diastolic (congestive) heart failure: Secondary | ICD-10-CM | POA: Diagnosis not present

## 2018-12-21 DIAGNOSIS — R4182 Altered mental status, unspecified: Secondary | ICD-10-CM | POA: Diagnosis not present

## 2018-12-21 DIAGNOSIS — R569 Unspecified convulsions: Secondary | ICD-10-CM | POA: Diagnosis not present

## 2018-12-27 DIAGNOSIS — G4733 Obstructive sleep apnea (adult) (pediatric): Secondary | ICD-10-CM | POA: Diagnosis not present

## 2019-01-20 DIAGNOSIS — I5032 Chronic diastolic (congestive) heart failure: Secondary | ICD-10-CM | POA: Diagnosis not present

## 2019-01-20 DIAGNOSIS — R569 Unspecified convulsions: Secondary | ICD-10-CM | POA: Diagnosis not present

## 2019-01-20 DIAGNOSIS — R4182 Altered mental status, unspecified: Secondary | ICD-10-CM | POA: Diagnosis not present

## 2019-01-26 DIAGNOSIS — G4733 Obstructive sleep apnea (adult) (pediatric): Secondary | ICD-10-CM | POA: Diagnosis not present

## 2019-02-20 DIAGNOSIS — R4182 Altered mental status, unspecified: Secondary | ICD-10-CM | POA: Diagnosis not present

## 2019-02-20 DIAGNOSIS — R569 Unspecified convulsions: Secondary | ICD-10-CM | POA: Diagnosis not present

## 2019-02-20 DIAGNOSIS — I5032 Chronic diastolic (congestive) heart failure: Secondary | ICD-10-CM | POA: Diagnosis not present

## 2019-02-26 ENCOUNTER — Telehealth: Payer: Self-pay | Admitting: Pulmonary Disease

## 2019-02-26 DIAGNOSIS — G4733 Obstructive sleep apnea (adult) (pediatric): Secondary | ICD-10-CM | POA: Diagnosis not present

## 2019-02-26 NOTE — Telephone Encounter (Signed)
Dia, [employer] requested last OV note to be faxed to [phone].  Last OV note faxed.  Confirmation received.  Nothing further at this time.

## 2019-03-11 ENCOUNTER — Other Ambulatory Visit: Payer: Self-pay | Admitting: Neurology

## 2019-03-11 ENCOUNTER — Telehealth: Payer: Self-pay | Admitting: Neurology

## 2019-03-11 NOTE — Telephone Encounter (Signed)
[name] is wanting to know if his father can get a refill on his Topamax with a weaning schedule. Please advise.

## 2019-03-12 ENCOUNTER — Telehealth: Payer: Self-pay | Admitting: Pulmonary Disease

## 2019-03-12 MED ORDER — BUDESONIDE-FORMOTEROL FUMARATE 160-4.5 MCG/ACT IN AERO: 2.0000 | INHALATION_SPRAY | Freq: Two times a day (BID) | RESPIRATORY_TRACT | 5 refills | Status: DC

## 2019-03-12 NOTE — Progress Notes (Signed)
Virtual Visit via Telephone Note  I connected with Danny Krause on 03/13/19 at  3:30 PM EDT by telephone and verified that I am speaking with the correct person using two identifiers.  Location: Patient: Home Provider: [facility] - [address], [place], [place], [place] [address]   I discussed the limitations, risks, security and privacy concerns of performing an evaluation and management service by telephone and the availability of in person appointments. I also discussed with the patient that there may be a patient responsible charge related to this service. The patient expressed understanding and agreed to proceed.  Patient consented to consult via telephone: Yes People present and their role in pt care: Pt   History of Present Illness: 78 year old male former smoker (quit 06/2017) initially referred to our office in 2019 for dyspnea and COPD.  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  Quit 06/2017.  128-pack-year smoking history. Maintenance: Symbicort 160 Pt of: [place]  Chief complaint: 106-month follow-up, COPD   78 year old male former smoker followed in our office for COPD.  Patient completing a 73-month follow-up with our office today.  Patient is completing a tele-visit as he is currently practicing social distancing as well as home isolation and due to the COVID-19 pandemic.  Patient and family are taking the utmost precautions to help protect patient.  Patient's son Aaron Edelman is also on the phone as he is the patient's main caregiver.  Patient's daughter helps coordinate patient's medications.  Patient Amrhein report that patient's breathing has been doing well and is stable. He has not had to use his rescue inhaler at all recently over the last month.  He continues to be maintained on Symbicort 160.  They had initially struggled with the cost of Symbicort 160 but this is been coordinated with our  office and now are managed on the generic of Symbicort 160 and this is now an affordable inhaler around $45 a month.  Patient is also managed on CPAP therapy and he reports that CPAP therapy is going fine.  Observations/Objective:  05/30/18 with moderate obstructive sleep apnea.  AHI 15.2, SpO2 low 82%.  03/27/2018-CT chest without contrast- emphysema, upper lobe predominant, no pulmonary nodule identified, trace left pleural effusion  02/11/2018-swallow study- dysphagia, mild aspiration risk  PFT: 06/2017 : FEV1 1.41L (53% pre), DLCO 8.35 ml (29%)  Imaging:  12/20/2017-chest x-ray- mild right basilar atelectasis 07/02/2017-CT Angio chest with contrast- no evidence of thoracic aortic aneurysm, aortic arthrosclerosis, 1.3 cm solid nodule in left lower lobe, 1.7 cm solid nodule in the right lower lobe  Cardiac:  12/19/2017-echocardiogram- LV ejection fraction 55 to 82%, grade 1 diastolic dysfunction  Results of the Epworth flowsheet 09/23/2018 01/10/2018  Sitting and reading 0 1  Watching TV 1 3  Sitting, inactive in a public place (e.g. a theatre or a meeting) 0 0  As a passenger in a car for an hour without a break 0 0  Lying down to rest in the afternoon when circumstances permit 0 0  Sitting and talking to someone 0 0  Sitting quietly after a lunch without alcohol 0 0  In a car, while stopped for a few minutes in traffic 0 0  Total score 1 4     Assessment and Plan:  OSA (obstructive sleep apnea) Assessment: Mild obstructive sleep apnea on September/2019 home sleep study  Plan: Continue CPAP therapy as prescribed Follow-up in 6 months  COPD, severe ([place]) Assessment:  October/2018 pulmonary function test shows FEV1 of 1.41 L, DLCO 29% July/2018 CT without contrast shows emphysema  Plan: Continue Symbicort 160 Follow-up with our office in 6 months Flu vaccine at that follow-up office visit or sooner Consider pulmonary rehab referral in the future once COVID-19 restrictions  are lifted, if patient is agreeable   Follow Up Instructions:  Return in about 6 months (around 09/13/2019), or if symptoms worsen or fail to improve, for Follow up with Dr. Lake Bells.   I discussed the assessment and treatment plan with the patient. The patient was provided an opportunity to ask questions and all were answered. The patient agreed with the plan and demonstrated an understanding of the instructions.   The patient was advised to call back or seek an in-person evaluation if the symptoms worsen or if the condition fails to improve as anticipated.  I provided 22 minutes of non-face-to-face time during this encounter.   Lauraine Rinne, NP

## 2019-03-12 NOTE — Telephone Encounter (Signed)
I called and talked with the patient's son.  He claims that the patient will not come in for blood work he refuses to leave his house.  The son claims that he is off Dilantin and he is on Keppra and Topamax.  Our records indicate that he is on Dilantin and Keppra, and he was given instructions to taper off of the Topamax back in November 2019.  The son is not the one who administers the medications, he is not really certain on what medications he is taking.  The son will contact the sister who gives the medication to the patient, he will find out what he is actually taking and what dose, and call us back.  If he is still on Topamax, Dilantin, and Keppra, I may consider a taper off of the Topamax.  They claim he has not had any further seizures since I talked with him last.

## 2019-03-12 NOTE — Telephone Encounter (Signed)
Instructions    Return in about 6 months (around 03/24/2019), or if symptoms worsen or fail to improve, for Follow up with Dr. Lake Bells. Continue Symbicort 160 >>> 2 puffs in the morning right when you wake up, rinse out your mouth after use, 12 hours later 2 puffs, rinse after use >>> Take this daily, no matter what >>> This is not a rescue inhaler   [place] for [place] paperwork given today    We recommend that you continue using your CPAP daily >>>Keep up the hard work using your device >>> Goal should be wearing this for the entire night that you are sleeping, at least 4 to 6 hours  Remember:   Do not drive or operate heavy machinery if tired or drowsy.   Please notify the supply company and office if you are unable to use your device regularly due to missing supplies or machine being broken.   Work on maintaining a healthy weight and following your recommended nutrition plan   Maintain proper daily exercise and movement   Maintaining proper use of your device can also help improve management of other chronic illnesses such as: Blood pressure, blood sugars, and weight management.   BiPAP/ CPAP Cleaning:  >>>Clean weekly, with Dawn soap, and bottle brush.  Set up to air dry.      Follow up in 6 months with Dr Lake Bells or sooner if symptoms worsen      Called and spoke with pt's son Aaron Edelman stating to him that we needed to send Rx for Symbicort to pharmacy for pt since no prescription had ever been sent. That way pharmacy can see how much it would cost and then at that point if Rx was too expensive, stated to Aaron Edelman that they could then see if Wyn Quaker, NP would be okay providing pt with samples. Aaron Edelman expressed understanding. Verified pt's preferred pharmacy and sent Rx to pharmacy for pt. Also stated to Aaron Edelman that pt was due for a follow up appt and stated to him that we could make this a televisit and Aaron Edelman verbalized understanding. Pt has been scheduled visit tomorrow, 7/9 at  3:30. Nothing further needed.

## 2019-03-13 ENCOUNTER — Other Ambulatory Visit: Payer: Self-pay

## 2019-03-13 ENCOUNTER — Ambulatory Visit (INDEPENDENT_AMBULATORY_CARE_PROVIDER_SITE_OTHER): Payer: PPO | Admitting: Pulmonary Disease

## 2019-03-13 ENCOUNTER — Encounter: Payer: Self-pay | Admitting: Pulmonary Disease

## 2019-03-13 DIAGNOSIS — J449 Chronic obstructive pulmonary disease, unspecified: Secondary | ICD-10-CM

## 2019-03-13 DIAGNOSIS — G4733 Obstructive sleep apnea (adult) (pediatric): Secondary | ICD-10-CM | POA: Diagnosis not present

## 2019-03-13 MED ORDER — ALBUTEROL SULFATE HFA 108 (90 BASE) MCG/ACT IN AERS: 1.0000 | INHALATION_SPRAY | Freq: Four times a day (QID) | RESPIRATORY_TRACT | 5 refills | Status: DC | PRN

## 2019-03-13 MED ORDER — TOPIRAMATE 50 MG PO TABS: ORAL_TABLET | ORAL | 2 refills | Status: DC

## 2019-03-13 NOTE — Assessment & Plan Note (Signed)
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

## 2019-03-13 NOTE — Telephone Encounter (Signed)
I called the son.  The patient currently is on 200 mg of Topamax twice daily, I will taper off by 50 mg each week until he is off the medication.  If he starts having problems with seizures they are to contact me.

## 2019-03-13 NOTE — Patient Instructions (Addendum)
Continue Symbicort 160 >>> 2 puffs in the morning right when you wake up, rinse out your mouth after use, 12 hours later 2 puffs, rinse after use >>> Take this daily, no matter what >>> This is not a rescue inhaler    Please start taking a daily antihistamine:  >>>choose one of: zyrtec, claritin, allegra, or xyzal  >>>these are over the counter medications  >>>can choose generic option  >>>take daily  >>>this medication helps with allergies, post nasal drip, and cough    Can also use fluticasone 1 spray each nostril daily as need for nasal congestion / wheezing     We recommend that you continue using your CPAP daily >>>Keep up the hard work using your device >>> Goal should be wearing this for the entire night that you are sleeping, at least 4 to 6 hours  Remember:   Do not drive or operate heavy machinery if tired or drowsy.   Please notify the supply company and office if you are unable to use your device regularly due to missing supplies or machine being broken.   Work on maintaining a healthy weight and following your recommended nutrition plan   Maintain proper daily exercise and movement   Maintaining proper use of your device can also help improve management of other chronic illnesses such as: Blood pressure, blood sugars, and weight management.   BiPAP/ CPAP Cleaning:  >>>Clean weekly, with Dawn soap, and bottle brush.  Set up to air dry.    Return in about 6 months (around 09/13/2019), or if symptoms worsen or fail to improve, for Follow up with Dr. Lake Bells.   Coronavirus (COVID-19) Are you at risk?  Are you at risk for the Coronavirus (COVID-19)?  To be considered HIGH RISK for Coronavirus (COVID-19), you have to meet the following criteria:  . Traveled to [place], [place], [place], [place] or [place]; or in the [place] to [place], [place], [place], or [place]; and have fever, cough, and shortness of breath within the last 2 weeks of travel  OR . Been in close contact with a person diagnosed with COVID-19 within the last 2 weeks and have fever, cough, and shortness of breath . IF YOU DO NOT MEET THESE CRITERIA, YOU ARE CONSIDERED LOW RISK FOR COVID-19.  What to do if you are HIGH RISK for COVID-19?  Marland Kitchen If you are having a medical emergency, call 911. . Seek medical care right away. Before you go to a doctor's office, urgent care or emergency department, call ahead and tell them about your recent travel, contact with someone diagnosed with COVID-19, and your symptoms. You should receive instructions from your physician's office regarding next steps of care.  . When you arrive at healthcare provider, tell the healthcare staff immediately you have returned from visiting [place], [place], [place], [place] or [place]; or traveled in the [place] to [place], [place], [place], or [place]; in the last two weeks or you have been in close contact with a person diagnosed with COVID-19 in the last 2 weeks.   . Tell the health care staff about your symptoms: fever, cough and shortness of breath. . After you have been seen by a medical provider, you will be either: o Tested for (COVID-19) and discharged home on quarantine except to seek medical care if symptoms worsen, and asked to  - Stay home and avoid contact with others until you get your results (4-5 days)  - Avoid travel on public  transportation if possible (such as bus, train, or airplane) or o Sent to the Emergency Department by EMS for evaluation, COVID-19 testing, and possible admission depending on your condition and test results.  What to do if you are LOW RISK for COVID-19?  Reduce your risk of any infection by using the same precautions used for avoiding the common cold or flu:  Marland Kitchen Wash your hands often with soap and warm water for at least 20 seconds.  If soap and water are not readily available, use an alcohol-based hand sanitizer with at least 60% alcohol.  . If coughing  or sneezing, cover your mouth and nose by coughing or sneezing into the elbow areas of your shirt or coat, into a tissue or into your sleeve (not your hands). . Avoid shaking hands with others and consider head nods or verbal greetings only. . Avoid touching your eyes, nose, or mouth with unwashed hands.  . Avoid close contact with people who are sick. . Avoid places or events with large numbers of people in one location, like concerts or sporting events. . Carefully consider travel plans you have or are making. . If you are planning any travel outside or inside the [place], visit the CDC's Travelers' Health webpage for the latest health notices. . If you have some symptoms but not all symptoms, continue to monitor at home and seek medical attention if your symptoms worsen. . If you are having a medical emergency, call 911.   [place] / e-Visit: [email or web address]         [facility]: [facility]: [phone]                   [facility]: [phone]           It is flu season:   >>> Best ways to protect herself from the flu: Receive the yearly flu vaccine, practice good hand hygiene washing with soap and also using hand sanitizer when available, eat a nutritious meals, get adequate rest, hydrate appropriately   Please contact the office if your symptoms worsen or you have concerns that you are not improving.   Thank you for choosing [facility] for your healthcare, and for allowing [place] to partner with you on your healthcare journey. I am thankful to be able to provide care to you today.   Wyn Quaker FNP-C

## 2019-03-13 NOTE — Telephone Encounter (Signed)
[name] called back and was able to confirm he is currently taking 200 mg of topamax 1 tablet twice per day. I asked the pt's son if he confirmed the dosage of the Dilantin and Keppra and he stated he had not. I asked him to please do so and call back.  Son proceeded to get irritated in regards to this request but I reminded him Dr. Jannifer Franklin original requested this information when he spoke with him yesterday.  [name] states he will call back once he confirmed the Keppra and Dilantin dosage as well.

## 2019-03-13 NOTE — Addendum Note (Signed)
Addended by: Kathrynn Ducking on: 03/13/2019 05:05 PM   Modules accepted: Orders

## 2019-03-13 NOTE — Telephone Encounter (Signed)
[name] returned my call and we confirmed medication dosages. Topamax~1 200 mg tab bid Keppra~ 750 mg 1 in the am and 2 in the pm Dilantin~ 100 mg tid.   [name] is asking for a tapering schedule for Topmax? Best call back is # [phone]. Pt will be out of the current dosage of topamax soon.

## 2019-03-13 NOTE — Assessment & Plan Note (Signed)
Assessment: October/2018 pulmonary function test shows FEV1 of 1.41 L, DLCO 29% July/2018 CT without contrast shows emphysema  Plan: Continue Symbicort 160 Follow-up with our office in 6 months Flu vaccine at that follow-up office visit or sooner Consider pulmonary rehab referral in the future once COVID-19 restrictions are lifted, if patient is agreeable

## 2019-03-19 ENCOUNTER — Telehealth: Payer: Self-pay | Admitting: Pulmonary Disease

## 2019-03-19 NOTE — Telephone Encounter (Signed)
Pt's OV notes have been faxed to provided fax number. Nothing further needed.

## 2019-03-22 ENCOUNTER — Other Ambulatory Visit: Payer: Self-pay | Admitting: Internal Medicine

## 2019-03-22 DIAGNOSIS — R4182 Altered mental status, unspecified: Secondary | ICD-10-CM | POA: Diagnosis not present

## 2019-03-22 DIAGNOSIS — R569 Unspecified convulsions: Secondary | ICD-10-CM | POA: Diagnosis not present

## 2019-03-22 DIAGNOSIS — I5032 Chronic diastolic (congestive) heart failure: Secondary | ICD-10-CM | POA: Diagnosis not present

## 2019-03-25 ENCOUNTER — Telehealth: Payer: Self-pay | Admitting: Neurology

## 2019-03-25 NOTE — Telephone Encounter (Signed)
I called patient and LVM regarding his 7/27 appt with Judson Roch, NP. I advised that appt will need to be converted to a virtual visit or rescheduled for in office if pt prefers, due to schedule changes. Requested patient call back.

## 2019-03-27 ENCOUNTER — Other Ambulatory Visit: Payer: Self-pay | Admitting: Internal Medicine

## 2019-03-28 DIAGNOSIS — G4733 Obstructive sleep apnea (adult) (pediatric): Secondary | ICD-10-CM | POA: Diagnosis not present

## 2019-03-31 ENCOUNTER — Ambulatory Visit: Payer: MEDICAID | Admitting: Neurology

## 2019-04-02 DIAGNOSIS — R4182 Altered mental status, unspecified: Secondary | ICD-10-CM | POA: Diagnosis not present

## 2019-04-02 DIAGNOSIS — I5032 Chronic diastolic (congestive) heart failure: Secondary | ICD-10-CM | POA: Diagnosis not present

## 2019-04-02 DIAGNOSIS — R569 Unspecified convulsions: Secondary | ICD-10-CM | POA: Diagnosis not present

## 2019-04-22 DIAGNOSIS — R4182 Altered mental status, unspecified: Secondary | ICD-10-CM | POA: Diagnosis not present

## 2019-04-22 DIAGNOSIS — I5032 Chronic diastolic (congestive) heart failure: Secondary | ICD-10-CM | POA: Diagnosis not present

## 2019-04-22 DIAGNOSIS — R569 Unspecified convulsions: Secondary | ICD-10-CM | POA: Diagnosis not present

## 2019-04-28 DIAGNOSIS — G4733 Obstructive sleep apnea (adult) (pediatric): Secondary | ICD-10-CM | POA: Diagnosis not present

## 2019-05-03 DIAGNOSIS — R4182 Altered mental status, unspecified: Secondary | ICD-10-CM | POA: Diagnosis not present

## 2019-05-03 DIAGNOSIS — I5032 Chronic diastolic (congestive) heart failure: Secondary | ICD-10-CM | POA: Diagnosis not present

## 2019-05-03 DIAGNOSIS — R569 Unspecified convulsions: Secondary | ICD-10-CM | POA: Diagnosis not present

## 2019-05-06 DIAGNOSIS — G4733 Obstructive sleep apnea (adult) (pediatric): Secondary | ICD-10-CM | POA: Diagnosis not present

## 2019-05-08 ENCOUNTER — Encounter: Payer: Self-pay | Admitting: Neurology

## 2019-05-08 ENCOUNTER — Telehealth: Payer: Self-pay | Admitting: Neurology

## 2019-05-08 ENCOUNTER — Telehealth: Payer: Self-pay | Admitting: Internal Medicine

## 2019-05-08 NOTE — Telephone Encounter (Signed)
Son says he needs the provider to write a letter stating that patient is not able to drive due to him being on Keppra and for having a history of having strokes and seizures and should not be driving at all. Please  mail to [facility] Address: [address] ,[place] [address].  Please advise.

## 2019-05-08 NOTE — Telephone Encounter (Signed)
Patient needing letter.  Please advise, thank you!

## 2019-05-08 NOTE — Telephone Encounter (Signed)
Letter was written, we will mail it to the son.

## 2019-05-08 NOTE — Telephone Encounter (Signed)
Patient's son reports he was told that he cannot drive d/t strokes, being on Keppra for seizures. Son reports Dr. Debara Pickett told them on 3 separate occasions that he cannot drive (?). Son reports patient will pass out if BP goes too low. Son reports patient is "hard headed" and is wanting to drive still, stating "my dad is not listening" and is "very stubborn". Patient's son is requesting such letters from ALL of his doctors. He is requesting the letters from the doctors so that he can "show his dad" that his doctors advise this.   Advised will route message to MD to review and advise

## 2019-05-08 NOTE — Telephone Encounter (Signed)
Ok to write a letter advising not to drive due to danger to other people as a result of unpredictable syncopal episodes.  Dr. Lemmie Evens

## 2019-05-08 NOTE — Telephone Encounter (Signed)
New Message:     Son says he needs Dr Debara Pickett to write a letter stating that patient is not able to drive and should not be driving at all and that the [facility] have been notified. Please  mail to [facility] Address: [address] [place].

## 2019-05-08 NOTE — Telephone Encounter (Signed)
LM for son to return call to advise why such letter needs to be written and if patient is aware of this request

## 2019-05-13 NOTE — Telephone Encounter (Signed)
Letter composed & mailed

## 2019-05-14 NOTE — Telephone Encounter (Signed)
Letter placed in out going mail at [facility].

## 2019-05-23 DIAGNOSIS — R4182 Altered mental status, unspecified: Secondary | ICD-10-CM | POA: Diagnosis not present

## 2019-05-23 DIAGNOSIS — I5032 Chronic diastolic (congestive) heart failure: Secondary | ICD-10-CM | POA: Diagnosis not present

## 2019-05-23 DIAGNOSIS — R569 Unspecified convulsions: Secondary | ICD-10-CM | POA: Diagnosis not present

## 2019-05-28 ENCOUNTER — Encounter: Payer: Self-pay | Admitting: Neurology

## 2019-05-28 NOTE — Progress Notes (Signed)
I called the son, the son is concerned that the patient is not stopping driving, he last had a seizure 2 or 3 months ago, they are concerned that he is not following medical recommendations not to drive.  They are worried that he may hurt himself or other individuals and have requested that I send a letter to [facility].

## 2019-05-28 NOTE — Telephone Encounter (Signed)
I did call the son, the son is concerned that the patient continues to drive, he continues to have seizures, last seizure was 2 or 3 months ago.  He is concerned that his father may hurt himself or other people while driving if he has a seizure.  I will go ahead and submit a letter to [facility].

## 2019-05-28 NOTE — Telephone Encounter (Signed)
[name] called stating that even though he has been told several times and has received a letter from the provider stating that he should not be driving the pt is refusing to listen and told his son that if the letter is not from the [facility] themselves "he is not going to listen to no doctor" [name] wants to know if records can be sent to the [facility] so that the [facility] can take the pt's licence because he refuses to listen to anyone else and the son is in fear that he is going to end up killing someone on the road. Please advise.

## 2019-05-29 DIAGNOSIS — G4733 Obstructive sleep apnea (adult) (pediatric): Secondary | ICD-10-CM | POA: Diagnosis not present

## 2019-05-29 NOTE — Telephone Encounter (Signed)
Letter placed in out going mail at [facility] for the pt and letter for [facility] has been fwd to medical records to be sent out.

## 2019-06-03 DIAGNOSIS — I5032 Chronic diastolic (congestive) heart failure: Secondary | ICD-10-CM | POA: Diagnosis not present

## 2019-06-03 DIAGNOSIS — R569 Unspecified convulsions: Secondary | ICD-10-CM | POA: Diagnosis not present

## 2019-06-03 DIAGNOSIS — R4182 Altered mental status, unspecified: Secondary | ICD-10-CM | POA: Diagnosis not present

## 2019-06-22 DIAGNOSIS — I5032 Chronic diastolic (congestive) heart failure: Secondary | ICD-10-CM | POA: Diagnosis not present

## 2019-06-22 DIAGNOSIS — R569 Unspecified convulsions: Secondary | ICD-10-CM | POA: Diagnosis not present

## 2019-06-22 DIAGNOSIS — R4182 Altered mental status, unspecified: Secondary | ICD-10-CM | POA: Diagnosis not present

## 2019-06-28 DIAGNOSIS — G4733 Obstructive sleep apnea (adult) (pediatric): Secondary | ICD-10-CM | POA: Diagnosis not present

## 2019-07-18 ENCOUNTER — Telehealth: Payer: Self-pay | Admitting: Internal Medicine

## 2019-07-18 ENCOUNTER — Telehealth: Payer: Self-pay | Admitting: Pulmonary Disease

## 2019-07-18 NOTE — Telephone Encounter (Signed)
Will need to call [employer] on Monday since it is now after 5pm.

## 2019-07-18 NOTE — Telephone Encounter (Signed)
New message  Rosendo Gros from [place] is calling in to speak with Dr. Lysbeth Penner nurse about patient. Transferred call.

## 2019-07-18 NOTE — Telephone Encounter (Signed)
Spoke with Rosendo Gros - he is being serviced for oxygen therapy. She is asking for authorization for oxygen orders. Explained that patient has a pulmonolgist and suggested she call there. Phone # provided.

## 2019-07-21 ENCOUNTER — Other Ambulatory Visit: Payer: Self-pay | Admitting: Neurology

## 2019-07-22 NOTE — Telephone Encounter (Signed)
We have not physically seen the pt in the last 6 months. I have left a message with [employer] to call us back to discuss this matter.

## 2019-07-23 NOTE — Telephone Encounter (Signed)
[employer] called advised that patient hasn't been seen - they will contact pt - call back number is [phone]

## 2019-07-29 DIAGNOSIS — Z20828 Contact with and (suspected) exposure to other viral communicable diseases: Secondary | ICD-10-CM | POA: Diagnosis not present

## 2019-07-29 DIAGNOSIS — G4733 Obstructive sleep apnea (adult) (pediatric): Secondary | ICD-10-CM | POA: Diagnosis not present

## 2019-08-28 DIAGNOSIS — G4733 Obstructive sleep apnea (adult) (pediatric): Secondary | ICD-10-CM | POA: Diagnosis not present

## 2019-09-09 ENCOUNTER — Telehealth: Payer: Self-pay

## 2019-09-09 NOTE — Telephone Encounter (Signed)
1) Medication(s) Requested (by name): sertraline (ZOLOFT) 50 MG tablet [[id number]  2) Pharmacy of Choice: [facility] [phone] - [place], [place]  [place], [place]   3) Special Requests:   Patient has scheduled FU for mychart for 12/03/2019

## 2019-09-09 NOTE — Telephone Encounter (Signed)
I see no record that we have given a prescription for Zoloft.

## 2019-09-28 DIAGNOSIS — G4733 Obstructive sleep apnea (adult) (pediatric): Secondary | ICD-10-CM | POA: Diagnosis not present

## 2019-10-10 ENCOUNTER — Telehealth: Payer: Self-pay | Admitting: Pulmonary Disease

## 2019-10-10 MED ORDER — BUDESONIDE-FORMOTEROL FUMARATE 160-4.5 MCG/ACT IN AERO: 2.0000 | INHALATION_SPRAY | Freq: Two times a day (BID) | RESPIRATORY_TRACT | 5 refills | Status: DC

## 2019-10-10 NOTE — Telephone Encounter (Signed)
Spoke with pt's son, symbicort has been sent to preferred pharmacy.  Nothing further needed at this time- will close encounter.

## 2019-10-10 NOTE — Telephone Encounter (Signed)
[name] calling because pt is almost out of Symbicort and states they have been trying to get it refilled , son also states the pharmacy has been trying to get in contact with Dr. Warner Mccreedy. Pt uses [facility] Son can be reached at [phone]

## 2019-10-10 NOTE — Telephone Encounter (Signed)
Attempted to call pt's son Aaron Edelman but unable to reach. Left message for him to return call.

## 2019-10-10 NOTE — Telephone Encounter (Signed)
Aaron Edelman son is returning phone call.  Aaron Edelman phone number is [phone].

## 2019-10-12 NOTE — Progress Notes (Signed)
Virtual Visit via Telephone Note  I connected with Danny Krause on 10/13/19 at  2:30 PM EST by telephone and verified that I am speaking with the correct person using two identifiers.  Location: Patient: Home Provider: [facility] - [address], [place], [place], [place] [address]   I discussed the limitations, risks, security and privacy concerns of performing an evaluation and management service by telephone and the availability of in person appointments. I also discussed with the patient that there may be a patient responsible charge related to this service. The patient expressed understanding and agreed to proceed.  Patient consented to consult via telephone: Yes People present and their role in pt care: Pt   History of Present Illness:  79 year old male former smoker (quit 06/2017) initially referred to our office in 2019 for dyspnea and COPD.  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  Quit 06/2017.  128-pack-year smoking history. Maintenance: Symbicort 160 Pt of: [place]  Chief complaint: 6 mo follow up    79 year old male former smoker followed in our office for COPD.  Patient last completed a follow-up with our office in July/2020.  Patient is very concerned regarding SARS-CoV-2 pandemic.  Unfortunately his brother just died from Covid complications.  His sister-in-law was also hospitalized he was recently discharged.  Patient has not yet received the Covid vaccine.  He has questions about the vaccine today.  Patient continues to be maintained well on Symbicort 160.  He requests refills of this today.  Patient was previously managed by Dr. Lake Bells in our office.  We will work to get the patient establish with a new pulmonologist since Dr. Lake Bells is now working full-time in the hospital.  Patient CPAP compliance continues to show some elevated central apneas.  We will have to further evaluate this.   Patient is doing an excellent job using his CPAP every night.  This has been a ongoing work in progress with the patient.  He has worked diligently to ensure that he is using it every night as well as every night greater than 4 hours.  09/12/2019-10/11/2019-CPAP compliance-30 had a last 30 days used, all 30 those days greater than 4 hours, average usage 9 hours and 59 minutes, APAP setting 5-15, AHI 22.8  Observations/Objective:  05/30/18 with moderate obstructive sleep apnea.  AHI 15.2, SpO2 low 82%.  03/27/2018-CT chest without contrast- emphysema, upper lobe predominant, no pulmonary nodule identified, trace left pleural effusion  02/11/2018-swallow study- dysphagia, mild aspiration risk  PFT: 06/2017 : FEV1 1.41L (53% pre), DLCO 8.35 ml (29%)  Imaging:  12/20/2017-chest x-ray- mild right basilar atelectasis 07/02/2017-CT Angio chest with contrast- no evidence of thoracic aortic aneurysm, aortic arthrosclerosis, 1.3 cm solid nodule in left lower lobe, 1.7 cm solid nodule in the right lower lobe  Cardiac:  12/19/2017-echocardiogram- LV ejection fraction 55 to 76%, grade 1 diastolic dysfunction  Social History   Tobacco Use  Smoking Status Former Smoker  . Packs/day: 2.00  . Years: 64.00  . Pack years: 128.00  . Types: Cigarettes  . Quit date: 07/04/2017  . Years since quitting: 2.2  Smokeless Tobacco Never Used  Tobacco Comment   SINCE I WAS 79 YRS OLD   Immunization History  Administered Date(s) Administered  . Influenza, High Dose Seasonal PF 07/08/2017, 06/26/2018  . Pneumococcal Conjugate-13 06/19/2016  . Pneumococcal Polysaccharide-23 06/04/2018  . 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 Halford Chessman  May need to consider CPAP titration in the future as AHI continues to be elevated.  As patient recently lost his brother will hold off on doing this today.  Can further evaluate at next office visit.  Healthcare maintenance Plan:  Recommend COVID19 vaccine when available  Added to covid19 [place] vaccine waitlist   Follow Up Instructions:  Return in about 3 months (around 01/10/2020), or if symptoms worsen or fail to improve, for Follow up with Dr. Halford Chessman- 37min office visit former BQ pt .   I discussed the assessment and treatment plan with the patient. The patient was provided an opportunity to ask questions and all were answered. The patient agreed with the plan and demonstrated an understanding of the instructions.   The patient was advised to call back or seek an in-person evaluation if the symptoms worsen or if the condition fails to improve as anticipated.  I provided 24 minutes of non-face-to-face time during this encounter.   Lauraine Rinne, NP

## 2019-10-13 ENCOUNTER — Other Ambulatory Visit: Payer: Self-pay

## 2019-10-13 ENCOUNTER — Encounter: Payer: Self-pay | Admitting: Pulmonary Disease

## 2019-10-13 ENCOUNTER — Ambulatory Visit (INDEPENDENT_AMBULATORY_CARE_PROVIDER_SITE_OTHER): Payer: PPO | Admitting: Pulmonary Disease

## 2019-10-13 DIAGNOSIS — J449 Chronic obstructive pulmonary disease, unspecified: Secondary | ICD-10-CM

## 2019-10-13 DIAGNOSIS — G4733 Obstructive sleep apnea (adult) (pediatric): Secondary | ICD-10-CM

## 2019-10-13 DIAGNOSIS — Z0000 Encounter for general adult medical examination without abnormal findings: Secondary | ICD-10-CM | POA: Insufficient documentation

## 2019-10-13 NOTE — Assessment & Plan Note (Signed)
Assessment: October/2018 pulmonary function test shows FEV1 of 1.41 L, DLCO 29% July/2018 CT without contrast shows emphysema  Plan: Continue Symbicort 160 Follow-up with our office in 6 months Added to [place] vaccine waitlist  Consider pulmonary rehab referral in the future once COVID-19 restrictions are lifted, if patient is agreeable

## 2019-10-13 NOTE — Patient Instructions (Addendum)
You were seen today by Lauraine Rinne, NP  for:   1. COPD, severe (HCC)  Continue Symbicort 160 >>> 2 puffs in the morning right when you wake up, rinse out your mouth after use, 12 hours later 2 puffs, rinse after use >>> Take this daily, no matter what >>> This is not a rescue inhaler   Only use your albuterol as a rescue medication to be used if you can't catch your breath by resting or doing a relaxed purse lip breathing pattern.  - The less you use it, the better it will work when you need it. - Ok to use up to 2 puffs  every 4 hours if you must but call for immediate appointment if use goes up over your usual need - Don't leave home without it !!  (think of it like the spare tire for your car)   Note your daily symptoms > remember "red flags" for COPD:   >>>Increase in cough >>>increase in sputum production >>>increase in shortness of breath or activity  intolerance.   If you notice these symptoms, please call the office to be seen.    2. OSA (obstructive sleep apnea)  We recommend that you continue using your CPAP daily >>>Keep up the hard work using your device >>> Goal should be wearing this for the entire night that you are sleeping, at least 4 to 6 hours  Remember:  . Do not drive or operate heavy machinery if tired or drowsy.  . Please notify the supply company and office if you are unable to use your device regularly due to missing supplies or machine being broken.  . Work on maintaining a healthy weight and following your recommended nutrition plan  . Maintain proper daily exercise and movement  . Maintaining proper use of your device can also help improve management of other chronic illnesses such as: Blood pressure, blood sugars, and weight management.   BiPAP/ CPAP Cleaning:  >>>Clean weekly, with Dawn soap, and bottle brush.  Set up to air dry. >>> Wipe mask out daily with wet wipe or towelette   3. Healthcare maintenance  We added you to the COVID19 vaccine  waitlist at [facility]  COVID-19 Vaccine Information can be found at: [email or web address].co.uk For questions related to vaccine distribution or appointments, please email [email or web address] or call [phone].      Follow Up:    Return in about 3 months (around 01/10/2020), or if symptoms worsen or fail to improve, for Follow up with Dr. Halford Chessman- 75min office visit former BQ pt .   Please do your part to reduce the spread of COVID-19:      Reduce your risk of any infection  and COVID19 by using the similar precautions used for avoiding the common cold or flu:  Marland Kitchen Wash your hands often with soap and warm water for at least 20 seconds.  If soap and water are not readily available, use an alcohol-based hand sanitizer with at least 60% alcohol.  . If coughing or sneezing, cover your mouth and nose by coughing or sneezing into the elbow areas of your shirt or coat, into a tissue or into your sleeve (not your hands). Langley Gauss A MASK when in public  . Avoid shaking hands with others and consider head nods or verbal greetings only. . Avoid touching your eyes, nose, or mouth with unwashed hands.  . Avoid close contact with people who are sick. . Avoid places or events with large numbers of people in  one location, like concerts or sporting events. . If you have some symptoms but not all symptoms, continue to monitor at home and seek medical attention if your symptoms worsen. . If you are having a medical emergency, call 911.   [place] / e-Visit: [email or web address]         [facility]: [facility]: [phone]                   [facility]: [phone]     It is flu season:   >>> Best ways to protect herself from the flu: Receive the yearly flu vaccine, practice good hand hygiene  washing with soap and also using hand sanitizer when available, eat a nutritious meals, get adequate rest, hydrate appropriately   Please contact the office if your symptoms worsen or you have concerns that you are not improving.   Thank you for choosing [facility] for your healthcare, and for allowing [place] to partner with you on your healthcare journey. I am thankful to be able to provide care to you today.   Wyn Quaker FNP-C

## 2019-10-13 NOTE — Assessment & Plan Note (Signed)
Plan:  Recommend COVID19 vaccine when available  Added to covid19 [place] vaccine waitlist

## 2019-10-13 NOTE — Assessment & Plan Note (Addendum)
Assessment: Mild obstructive sleep apnea on September/2019 home sleep study  Pt comfortable with cpap, denies leaks   Plan: Continue CPAP therapy as prescribed Follow-up in 3-6 months to establish with Dr. Halford Chessman  May need to consider CPAP titration in the future as AHI continues to be elevated.  As patient recently lost his brother will hold off on doing this today.  Can further evaluate at next office visit.

## 2019-10-13 NOTE — Progress Notes (Signed)
Reviewed and agree with assessment/plan.   Jood Retana, MD [facility] 08/30/2016, 12:24 PM Pager:  [phone]  

## 2019-11-03 ENCOUNTER — Other Ambulatory Visit: Payer: Self-pay | Admitting: Neurology

## 2019-11-06 ENCOUNTER — Other Ambulatory Visit: Payer: Self-pay | Admitting: Neurology

## 2019-11-06 MED ORDER — LEVETIRACETAM 750 MG PO TABS: ORAL_TABLET | ORAL | 1 refills | Status: DC

## 2019-11-06 MED ORDER — SERTRALINE HCL 50 MG PO TABS: 50.0000 mg | ORAL_TABLET | Freq: Every day | ORAL | 1 refills | Status: DC

## 2019-11-06 NOTE — Telephone Encounter (Signed)
I sent in a prescription for the Zoloft.

## 2019-11-06 NOTE — Telephone Encounter (Signed)
[name] Danny Krause called stating that the pharmacy has requested several times for the pt's sertraline (ZOLOFT) 50 MG tablet and the levETIRAcetam (KEPPRA) 750 MG tablet and the family is wanting to know when this will be sent in for the pt since he is completely out of the Zoloft and only has a couple of days left of the Keppra. Please call son or daughter [name] back with update. Please advise.

## 2019-11-06 NOTE — Telephone Encounter (Signed)
LVM for son, Aaron Edelman and informed him that Dr Aletha Halim  Doesn't prescribe Zoloft. Per our records Dr Alma Friendly prescribed it. I advised Dr Jannifer Franklin refilled levetiracetam Nov 2020 for 6 months. [facility] should have Refill on file. Left #.

## 2019-11-06 NOTE — Addendum Note (Signed)
Addended by: Minna Antis on: 11/06/2019 05:04 PM   Modules accepted: Orders

## 2019-11-06 NOTE — Addendum Note (Signed)
Addended by: Kathrynn Ducking on: 11/06/2019 06:12 PM   Modules accepted: Orders

## 2019-11-06 NOTE — Telephone Encounter (Addendum)
Son called back and stated pharmacy stated he needs MD authorization for Keppra; they don't have refill on file. I advised we can refill keppra again. I advised son of Zoloft Rx that was given to him as inpatient 03/2018. Danny Krause stated [facility] told him they called this office x 2 for zoloft refill from Dr Jannifer Franklin. I informed him that we have no note of calls from [facility] regarding this. Typically pCP prescribes zoloft.  He stated his mom passed away and his dad needs Zoloft. His PCP has left office and due to Covid his father hasn't seen PCP in a long time.  I advised Danny Krause we'll refill Keppra but Dr Jannifer Franklin must make decision on Zoloft. I advised will send his request to Dr Jannifer Franklin. Danny Krause  verbalized understanding, appreciation.

## 2019-11-25 ENCOUNTER — Other Ambulatory Visit: Payer: Self-pay

## 2019-11-25 ENCOUNTER — Encounter: Payer: Self-pay | Admitting: Pulmonary Disease

## 2019-11-25 ENCOUNTER — Ambulatory Visit (INDEPENDENT_AMBULATORY_CARE_PROVIDER_SITE_OTHER): Payer: PPO | Admitting: Pulmonary Disease

## 2019-11-25 DIAGNOSIS — G4733 Obstructive sleep apnea (adult) (pediatric): Secondary | ICD-10-CM | POA: Diagnosis not present

## 2019-11-25 DIAGNOSIS — J449 Chronic obstructive pulmonary disease, unspecified: Secondary | ICD-10-CM | POA: Diagnosis not present

## 2019-11-25 NOTE — Patient Instructions (Signed)
Will arrange for CPAP mask refitting  Follow up in 4 months

## 2019-11-25 NOTE — Progress Notes (Signed)
[facility]  Chief Complaint  Patient presents with  . Follow-up    Televisit today- Breathing is doing well and no new co's    Constitutional:  There were no vitals taken for this visit.  Deferred.  Past Medical History:  CAD, Seizures, CVA  Brief Summary:  Danny Krause is a 79 y.o. male former smoker with COPD/emphysema and obstructive sleep apnea.  Virtual Visit via Telephone Note  I connected with Danny Krause on 11/25/19 at 12:00 PM EDT by telephone and verified that I am speaking with the correct person using two identifiers.  Location: Patient: home Provider: medical office   I discussed the limitations, risks, security and privacy concerns of performing an evaluation and management service by telephone and the availability of in person appointments. I also discussed with the patient that there may be a patient responsible charge related to this service. The patient expressed understanding and agreed to proceed.  Uses CPAP nightly.  Has some mask leak.  Pressure feels okay.  Not having cough, sputum, wheeze, chest tightness.   Physical Exam:  Deferred.   Assessment/Plan:   COPD with emphysema and asthma. - continue symbicort, prn albuterol  Obstructive sleep apnea. - he reports compliance with CPAP and benefit from therapy - AHI elevated on CPAP download, associated with air leak - will have DME refit CPAP mask - if problem persists, would then need in lab titration study   Patient Instructions  Will arrange for CPAP mask refitting  Follow up in 4 months   I discussed the assessment and treatment plan with the patient. The patient was provided an opportunity to ask questions and all were answered. The patient agreed with the plan and demonstrated an understanding of the instructions.   The patient was advised to call back or seek an in-person evaluation if the symptoms worsen or if the condition fails  to improve as anticipated.  I provided 24 minutes of non-face-to-face time during this encounter.   Chesley Mires, MD [facility] Pager: [phone] 11/25/2019, 4:08 PM  Flow Sheet     Pulmonary tests:  PFT 07/03/17 >> FEV1 1.72 (65%), FEV1% 43, TLC 8.09 (125%), DLCO 29%, +BD  Chest imaging:  CT chest 03/27/18 >> atherosclerosis, moderate centrilobular emphysema   Sleep tests:  HST 05/30/18 >> AHI 15.2, SpO2 low 82% Auto CPAP 10/24/19 to 11/22/19 >> used on 30 of 30 nights with average 9 hrs 32 min.  Average AHI 28.4 with median CPAP 8 and 95 th percentile CPAP 11 cm H2O.  Air leak.  Cardiac tests:  Echo 12/19/17 >> EF 55 to 60%, grade 1 DD, mild MR  Medications:   Allergies as of 11/25/2019   No Known Allergies     Medication List       Accurate as of November 25, 2019  4:08 PM. If you have any questions, ask your nurse or doctor.        acetaminophen 325 MG tablet Commonly known as: TYLENOL Take 325 mg by mouth every 6 (six) hours as needed for mild pain, fever or headache.   albuterol 108 (90 Base) MCG/ACT inhaler Commonly known as: VENTOLIN HFA Inhale 1-2 puffs into the lungs every 6 (six) hours as needed for wheezing or shortness of breath.   atorvastatin 40 MG tablet Commonly known as: LIPITOR TAKE 1 TABLET BY MOUTH EVERY DAY   bethanechol 5 MG tablet Commonly known as: URECHOLINE Take 5 mg by mouth 3 (  three) times daily.   budesonide-formoterol 160-4.5 MCG/ACT inhaler Commonly known as: Symbicort Inhale 2 puffs into the lungs 2 (two) times daily.   famotidine 20 MG tablet Commonly known as: PEPCID Take 1 tablet (20 mg total) by mouth 2 (two) times daily.   hydrALAZINE 10 MG tablet Commonly known as: APRESOLINE TAKE 1 TABLET(10 MG) BY MOUTH THREE TIMES DAILY   levETIRAcetam 750 MG tablet Commonly known as: KEPPRA TAKE 1 TABLET BY MOUTH IN THE MORNING AND 2 TABLETS IN THE EVENING   losartan 100 MG tablet Commonly known as: COZAAR  TAKE 1 TABLET(100 MG) BY MOUTH DAILY   MULTIVITAMIN ADULTS PO Take 1 tablet by mouth daily.   phenytoin 100 MG ER capsule Commonly known as: DILANTIN TAKE 1 CAPSULE(100 MG) BY MOUTH THREE TIMES DAILY   sennosides-docusate sodium 8.6-50 MG tablet Commonly known as: SENOKOT-S Take 1 tablet by mouth daily.   sertraline 50 MG tablet Commonly known as: ZOLOFT Take 1 tablet (50 mg total) by mouth daily.   Spacer/Aero-Holding [employer] Use with your symbicort and your rescue inhaler   topiramate 50 MG tablet Commonly known as: Topamax Begin taking 3 tablets in the morning and 4 in the evening, taper by 1 tablet every week until off the medication.       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  Family History:  His family history includes Hernia in his father; Stroke in his brother.  Social History:  He  reports that he quit smoking about 2 years ago. His smoking use included cigarettes. He has a 128.00 pack-year smoking history. He has never used smokeless tobacco. He reports that he does not drink alcohol or use drugs.

## 2019-11-26 ENCOUNTER — Other Ambulatory Visit: Payer: Self-pay | Admitting: Pulmonary Disease

## 2019-12-03 ENCOUNTER — Telehealth: Payer: MEDICAID | Admitting: Neurology

## 2019-12-03 NOTE — Progress Notes (Deleted)
Virtual Visit via Video Note  I connected with Danny Krause on 12/03/19 at  3:15 PM EDT by a video enabled telemedicine application and verified that I am speaking with the correct person using two identifiers.  Location: Patient: *** Provider: ***   I discussed the limitations of evaluation and management by telemedicine and the availability of in person appointments. The patient expressed understanding and agreed to proceed.  History of Present Illness: 12/03/2019 SS: Danny Krause is a 79 year old male with history of seizures.  He was placed on 4 different seizure medications coming out of the hospital in May 2019 (Dilantin, Vimpat, Keppra, Topamax).  He has continued to have brief focal seizures.  He was supposed to come off Topamax in fall 2019.  He has refused to come into the office for blood work.  The son requested a letter be sent to the [facility], stating that the patient can drive, we have done this, as he continues to drive.  11/28/2018 Dr. Jannifer Franklin: Danny Krause is a 79 year old right-handed white male with a history of seizures, he was placed on 4 different seizure medications coming out of hospital on 24 Jan 2018.  The patient had seizures with left-sided weakness and was in status epilepticus at that time.  He was treated with Dilantin, Vimpat, Keppra, and Topamax.  The patient was taken off of his Seroquel after he was seen in August 2019, the Vimpat was slowly discontinued and then the Topamax was tapered off.  The patient currently remains on Dilantin and Keppra.  The patient does have an occasional seizure, he has had about 2 seizures since last seen associated with brief right hand shaking unassociated with loss of consciousness.  The patient has gait instability, he uses a cane or a walker to get around, he has not had any falls.  The patient is on CPAP for sleep apnea, he is doing well with this.  He denies any side effects on the medication.  He does not operate a motor vehicle.    Observations/Objective:   Assessment and Plan:   Follow Up Instructions:    I discussed the assessment and treatment plan with the patient. The patient was provided an opportunity to ask questions and all were answered. The patient agreed with the plan and demonstrated an understanding of the instructions.   The patient was advised to call back or seek an in-person evaluation if the symptoms worsen or if the condition fails to improve as anticipated.  I provided *** minutes of non-face-to-face time during this encounter.   Suzzanne Cloud, NP

## 2019-12-16 DIAGNOSIS — G4733 Obstructive sleep apnea (adult) (pediatric): Secondary | ICD-10-CM | POA: Diagnosis not present

## 2020-01-16 ENCOUNTER — Other Ambulatory Visit (HOSPITAL_COMMUNITY): Payer: PPO

## 2020-01-16 ENCOUNTER — Other Ambulatory Visit: Payer: Self-pay | Admitting: *Deleted

## 2020-01-16 DIAGNOSIS — J449 Chronic obstructive pulmonary disease, unspecified: Secondary | ICD-10-CM

## 2020-01-16 NOTE — Progress Notes (Signed)
pft  

## 2020-01-20 ENCOUNTER — Ambulatory Visit: Payer: PPO | Admitting: Pulmonary Disease

## 2020-01-24 IMAGING — CT CT HEAD W/O CM
4 series · 16 of 47 positions shown, 18 images · non-contrast
Comparison: CT of the head performed 03/13/2017, and MRI of the
brain performed 03/14/2017

CLINICAL DATA: Acute onset of altered level of consciousness.

EXAM:
CT HEAD WITHOUT CONTRAST
TECHNIQUE: Contiguous axial images were obtained from the base of the skull
through the vertex without intravenous contrast.

[Series 3: head wo · axial · 0.41mm/px · z∈[-116,+4]mm · 7 of 33 slices shown, 9 images]
[im 5/33  brain]
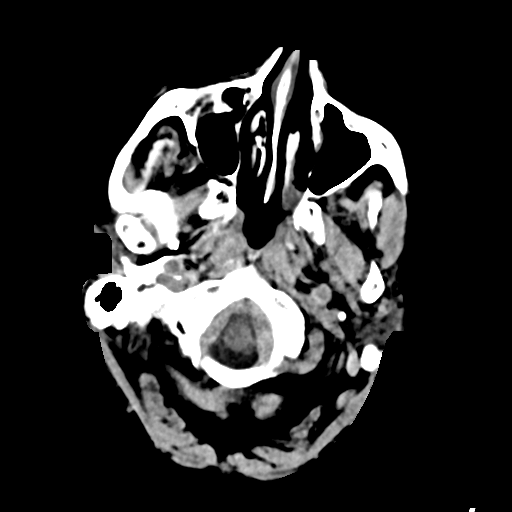
[im 5/33  bone]
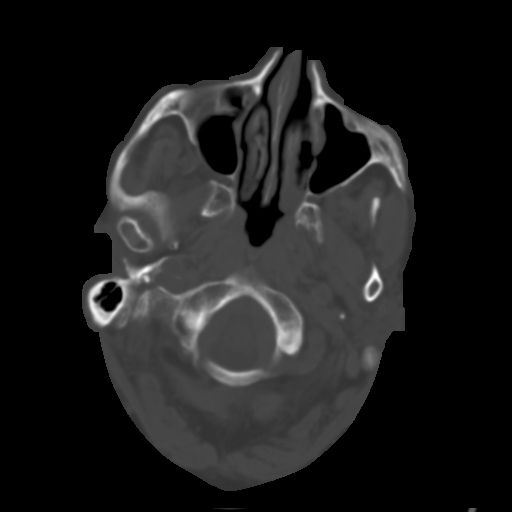
[im 9/33  brain]
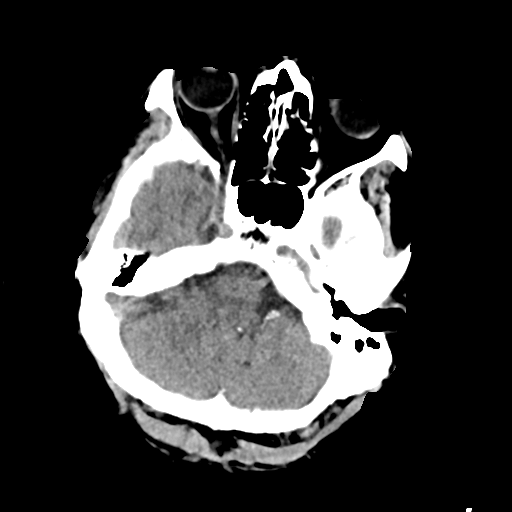
[im 13/33  brain]
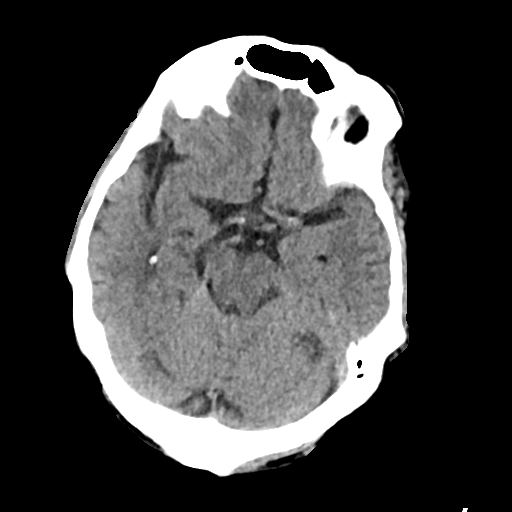
[im 17/33  brain]
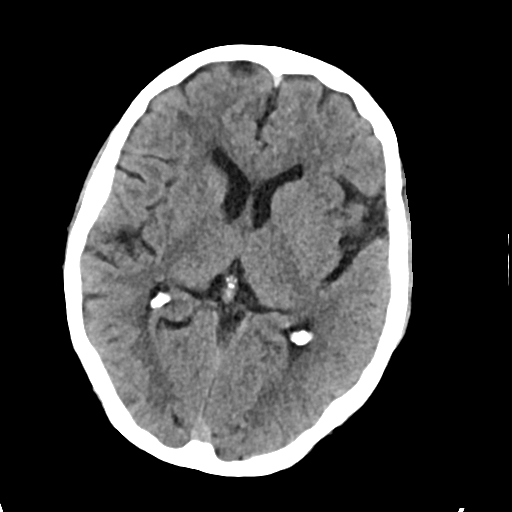
[im 21/33  brain]
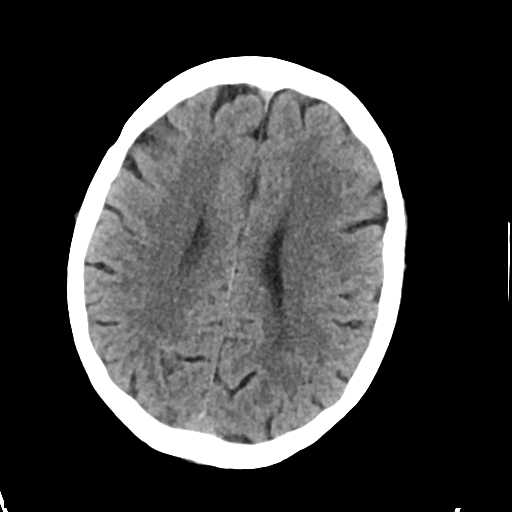
[im 21/33  bone]
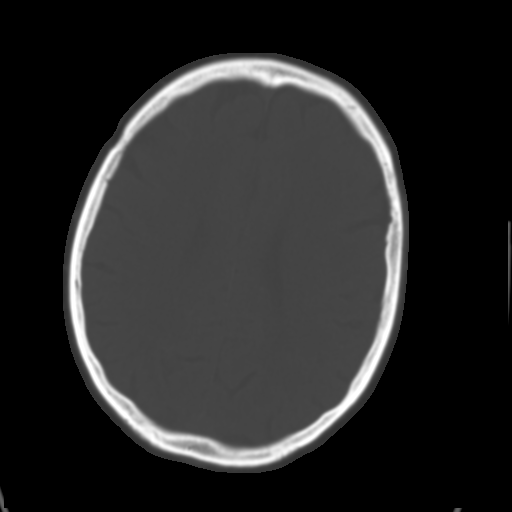
[im 25/33  brain]
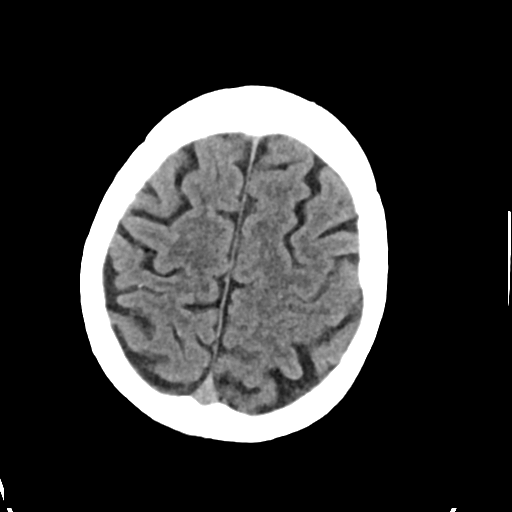
[im 29/33  brain]
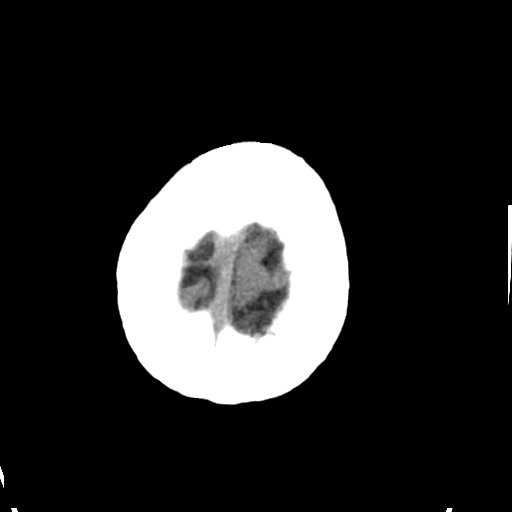

[Series 4: head bone · axial · 0.41mm/px · z∈[-120,-88]mm · 3 of 82 slices shown]
[im 9/82  bone]
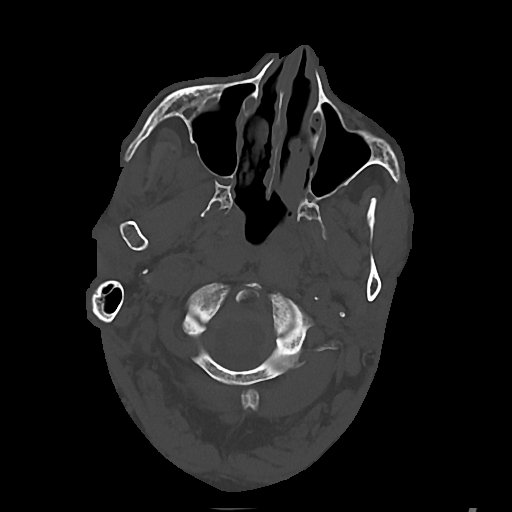
[im 17/82  bone]
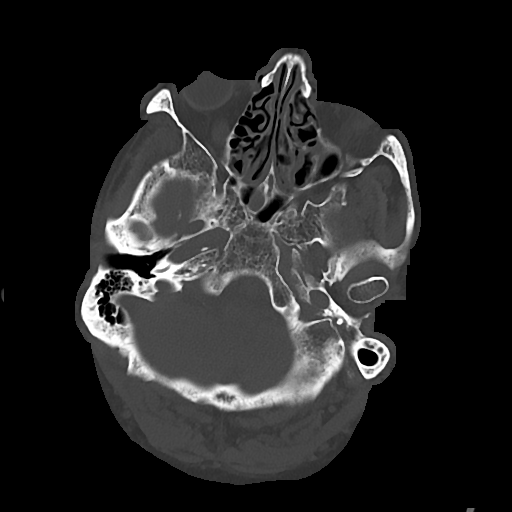
[im 25/82  bone]
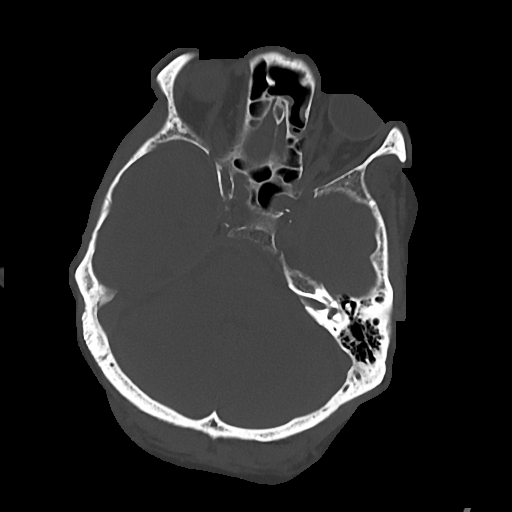

[Series 5: cor soft · coronal · 0.33mm/px · 3 of 67 slices shown]
[im 23/67  brain]
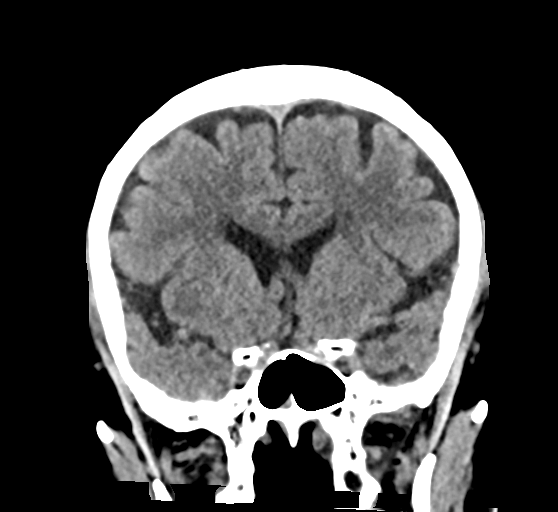
[im 30/67  brain]
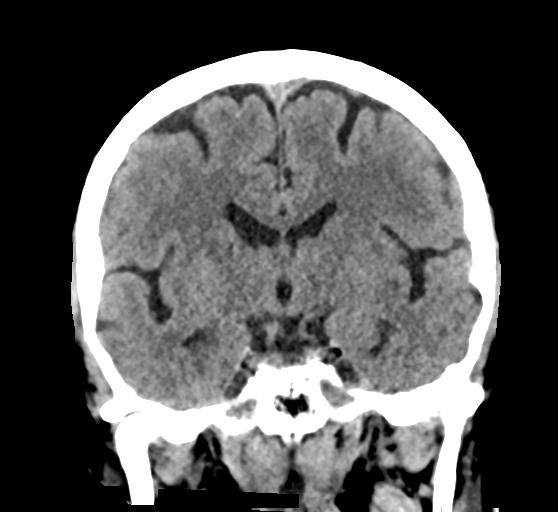
[im 37/67  brain]
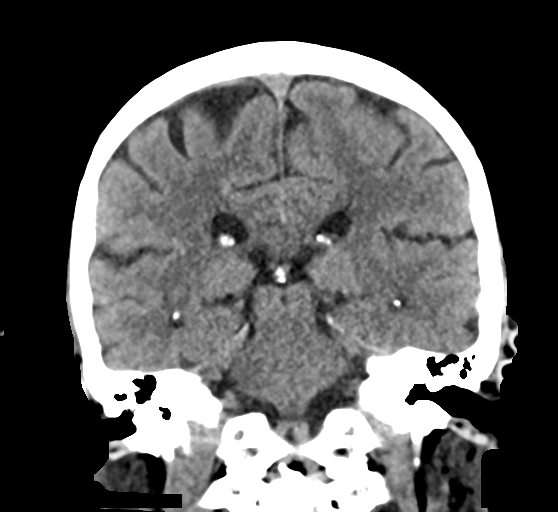

[Series 6: sag soft · sagittal · 0.33mm/px · 3 of 54 slices shown]
[im 18/54  brain]
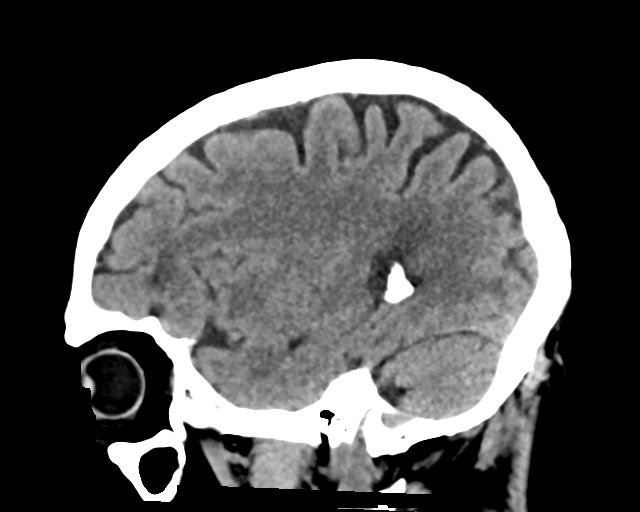
[im 27/54  brain]
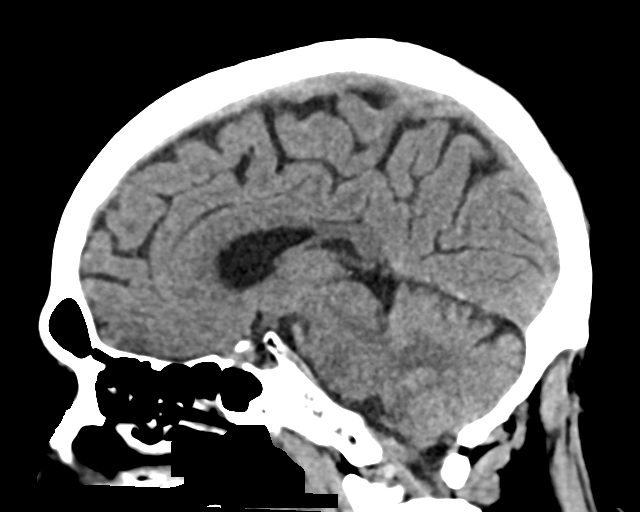
[im 36/54  brain]
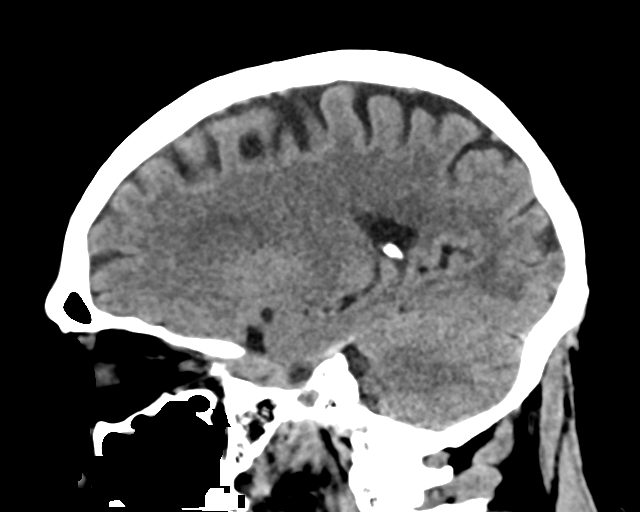

[16 of 47 positions shown; findings below may reference images not displayed]

FINDINGS: Brain: No evidence of acute infarction, hemorrhage, hydrocephalus,
extra-axial collection or mass lesion/mass effect.

Periventricular matter change likely reflects small vessel ischemic
microangiopathy. A small chronic infarct is noted at the right
frontal lobe, with associated encephalomalacia.

The posterior fossa, including the cerebellum, brainstem and fourth
ventricle, is within normal limits. The third and lateral
ventricles, and basal ganglia are unremarkable in appearance. No
mass effect or midline shift is seen.

Vascular: No hyperdense vessel or unexpected calcification.

Skull: There is no evidence of fracture; visualized osseous
structures are unremarkable in appearance.

Sinuses/Orbits: The orbits are within normal limits. The paranasal
sinuses and mastoid air cells are well-aerated.

Other: No significant soft tissue abnormalities are seen.
IMPRESSION: 1. No acute intracranial pathology seen on CT.
2. Small vessel ischemic microangiopathy. Small chronic infarct at
the right frontal lobe, with associated encephalomalacia.

## 2020-01-30 ENCOUNTER — Other Ambulatory Visit: Payer: Self-pay | Admitting: Neurology

## 2020-02-11 DIAGNOSIS — J449 Chronic obstructive pulmonary disease, unspecified: Secondary | ICD-10-CM | POA: Diagnosis not present

## 2020-02-11 DIAGNOSIS — E7439 Other disorders of intestinal carbohydrate absorption: Secondary | ICD-10-CM | POA: Diagnosis not present

## 2020-02-11 DIAGNOSIS — R1310 Dysphagia, unspecified: Secondary | ICD-10-CM | POA: Diagnosis not present

## 2020-02-11 DIAGNOSIS — I2510 Atherosclerotic heart disease of native coronary artery without angina pectoris: Secondary | ICD-10-CM | POA: Diagnosis not present

## 2020-02-11 DIAGNOSIS — Z954 Presence of other heart-valve replacement: Secondary | ICD-10-CM | POA: Diagnosis not present

## 2020-02-11 DIAGNOSIS — R011 Cardiac murmur, unspecified: Secondary | ICD-10-CM | POA: Diagnosis not present

## 2020-02-11 DIAGNOSIS — I639 Cerebral infarction, unspecified: Secondary | ICD-10-CM | POA: Diagnosis not present

## 2020-02-11 DIAGNOSIS — G40909 Epilepsy, unspecified, not intractable, without status epilepticus: Secondary | ICD-10-CM | POA: Diagnosis not present

## 2020-02-11 DIAGNOSIS — R05 Cough: Secondary | ICD-10-CM | POA: Diagnosis not present

## 2020-02-11 DIAGNOSIS — I10 Essential (primary) hypertension: Secondary | ICD-10-CM | POA: Diagnosis not present

## 2020-02-11 DIAGNOSIS — K219 Gastro-esophageal reflux disease without esophagitis: Secondary | ICD-10-CM | POA: Diagnosis not present

## 2020-02-11 DIAGNOSIS — E7800 Pure hypercholesterolemia, unspecified: Secondary | ICD-10-CM | POA: Diagnosis not present

## 2020-02-12 ENCOUNTER — Encounter: Payer: Self-pay | Admitting: Neurology

## 2020-02-12 ENCOUNTER — Ambulatory Visit: Payer: PPO | Admitting: Neurology

## 2020-02-12 VITALS — BP 145/85 | HR 80 | Ht 65.0 in | Wt 157.0 lb

## 2020-02-12 DIAGNOSIS — R569 Unspecified convulsions: Secondary | ICD-10-CM | POA: Diagnosis not present

## 2020-02-12 MED ORDER — LEVETIRACETAM 750 MG PO TABS: ORAL_TABLET | ORAL | 3 refills | Status: DC

## 2020-02-12 MED ORDER — SERTRALINE HCL 50 MG PO TABS: 50.0000 mg | ORAL_TABLET | Freq: Every day | ORAL | 1 refills | Status: DC

## 2020-02-12 MED ORDER — PHENYTOIN SODIUM EXTENDED 100 MG PO CAPS: ORAL_CAPSULE | ORAL | 3 refills | Status: DC

## 2020-02-12 NOTE — Progress Notes (Signed)
I have read the note, and I agree with the clinical assessment and plan.  Danny Krause K Danny Krause   

## 2020-02-12 NOTE — Patient Instructions (Signed)
Continue current medications  Check blood work today  See you back in 6 months

## 2020-02-12 NOTE — Progress Notes (Signed)
PATIENT: Danny Krause DOB: December 24, 1940  REASON FOR VISIT: follow up HISTORY FROM: patient  HISTORY OF PRESENT ILLNESS: Today 02/12/20 Mr. Chmiel is a 79 year old male with history of seizures.  At one point he was on four different seizure medications after hospital discharge in May 2019.  At that time had seizures with left-sided weakness and was in status epilepticus.  He was tapered off Vimpat, he was to taper off Topamax, but remains on this, along with Dilantin and Keppra. He is on CPAP for sleep apnea.  Unclear, if he has had actual seizure, describes events that may occur during times of anxiety, his right hand may shake, there is no staring off or loss of consciousness.  He is able to stop it and control it.  He lives by himself, but splits his time living alone and with his daughter and son-in-law.  He does his own daily activities and manages his affairs.  The DMV took his driver's license due to seizures.  He needs a refill on Zoloft for anxiety. No falls, he has a cane just in case. He presents today for evaluation accompanied by his son-in-law, Abbe Amsterdam.  HISTORY 11/28/2018 Dr. Jannifer Franklin: Mr. Barley is a 79 year old right-handed white male with a history of seizures, he was placed on 4 different seizure medications coming out of hospital on 24 Jan 2018.  The patient had seizures with left-sided weakness and was in status epilepticus at that time.  He was treated with Dilantin, Vimpat, Keppra, and Topamax.  The patient was taken off of his Seroquel after he was seen in August 2019, the Vimpat was slowly discontinued and then the Topamax was tapered off.  The patient currently remains on Dilantin and Keppra.  The patient does have an occasional seizure, he has had about 2 seizures since last seen associated with brief right hand shaking unassociated with loss of consciousness.  The patient has gait instability, he uses a cane or a walker to get around, he has not had any falls.  The patient  is on CPAP for sleep apnea, he is doing well with this.  He denies any side effects on the medication.  He does not operate a motor vehicle.   REVIEW OF SYSTEMS: Out of a complete 14 system review of symptoms, the patient complains only of the following symptoms, and all other reviewed systems are negative.  Seizure  ALLERGIES: No Known Allergies  HOME MEDICATIONS: Outpatient Medications Prior to Visit  Medication Sig Dispense Refill  . acetaminophen (TYLENOL) 325 MG tablet Take 325 mg by mouth every 6 (six) hours as needed for mild pain, fever or headache.     . albuterol (VENTOLIN HFA) 108 (90 Base) MCG/ACT inhaler INHALE 1 OR 2 PUFFS INTO THE LUNGS EVERY 6 HOURS AS NEEDED FOR WHEEZING OR SHORTNESS OF BREATH 8.5 g 5  . aspirin 81 MG chewable tablet Chew by mouth daily.    Marland Kitchen atorvastatin (LIPITOR) 40 MG tablet TAKE 1 TABLET BY MOUTH EVERY DAY 90 tablet 3  . budesonide-formoterol (SYMBICORT) 160-4.5 MCG/ACT inhaler Inhale 2 puffs into the lungs 2 (two) times daily. 1 Inhaler 5  . hydrALAZINE (APRESOLINE) 10 MG tablet TAKE 1 TABLET(10 MG) BY MOUTH THREE TIMES DAILY 270 tablet 3  . losartan (COZAAR) 100 MG tablet TAKE 1 TABLET(100 MG) BY MOUTH DAILY 90 tablet 3  . Multiple Vitamins-Minerals (MULTIVITAMIN ADULTS PO) Take 1 tablet by mouth daily.    Marland Kitchen omeprazole (PRILOSEC) 20 MG capsule Take 20 mg by  mouth 2 (two) times daily before a meal.    . levETIRAcetam (KEPPRA) 750 MG tablet TAKE 1 TABLET BY MOUTH IN THE MORNING AND 2 TABLETS IN THE EVENING 270 tablet 1  . phenytoin (DILANTIN) 100 MG ER capsule TAKE 1 CAPSULE(100 MG) BY MOUTH THREE TIMES DAILY. PLEASE CALL [phone] to schedule f/u for future refills 30 capsule 0  . sertraline (ZOLOFT) 50 MG tablet Take 50 mg by mouth daily.    Marland Kitchen Spacer/Aero-Holding Dorise Bullion Use with your symbicort and your rescue inhaler 1 each 0  . bethanechol (URECHOLINE) 5 MG tablet Take 5 mg by mouth 3 (three) times daily.    . famotidine (PEPCID) 20 MG  tablet Take 1 tablet (20 mg total) by mouth 2 (two) times daily. 60 tablet 0  . sennosides-docusate sodium (SENOKOT-S) 8.6-50 MG tablet Take 1 tablet by mouth daily.    . sertraline (ZOLOFT) 50 MG tablet Take 1 tablet (50 mg total) by mouth daily. 90 tablet 1  . topiramate (TOPAMAX) 50 MG tablet Begin taking 3 tablets in the morning and 4 in the evening, taper by 1 tablet every week until off the medication. 200 tablet 2   No facility-administered medications prior to visit.    PAST MEDICAL HISTORY: Past Medical History:  Diagnosis Date  . Anginal pain ([place])    with exertion  . Coronary artery disease   . Critical aortic valve stenosis   . Dyspnea    with exertion  . 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 Murmur   . Seizure ([place])   . Stroke ([place])   . Syncope     PAST SURGICAL HISTORY: Past Surgical History:  Procedure Laterality Date  . AORTIC VALVE REPLACEMENT N/A 07/04/2017   Procedure: AORTIC VALVE REPLACEMENT (AVR);  Surgeon: Grace Isaac, MD;  Location: [place];  Service: Open Heart Surgery;  Laterality: N/A;  . CORONARY ARTERY BYPASS GRAFT N/A 07/04/2017   Procedure: CORONARY ARTERY BYPASS GRAFTING (CABG) x three, using left internal mammary artery and right leg greater saphenous vein harvested endoscopically;  Surgeon: Grace Isaac, MD;  Location: [place] [facility];  Service: Open Heart Surgery;  Laterality: N/A;  . NO PAST SURGERIES    . RIGHT/LEFT HEART CATH AND CORONARY ANGIOGRAPHY N/A 06/25/2017   Procedure: RIGHT/LEFT HEART CATH AND CORONARY ANGIOGRAPHY;  Surgeon: Troy Sine, MD;  Location: [facility];  Service: Cardiovascular;  Laterality: N/A;  . TEE WITHOUT CARDIOVERSION N/A 07/04/2017   Procedure: TRANSESOPHAGEAL ECHOCARDIOGRAM (TEE);  Surgeon: Grace Isaac, MD;  Location: [place];  Service: Open Heart Surgery;  Laterality: N/A;  . THORACIC AORTOGRAM  06/25/2017   Procedure: THORACIC AORTOGRAM;  Surgeon: Troy Sine, MD;  Location: [place];  Service: Cardiovascular;;  aortic root   . TRACHEOSTOMY CLOSURE  02/2018    FAMILY HISTORY: Family History  Problem Relation Age of Onset  . Hernia Father   . Stroke Brother     SOCIAL HISTORY: Social History   Socioeconomic History  . Marital status: Married    Spouse name: [name]  . Number of children: 2  . Years of education: 51  . Highest education level: Not on file  Occupational History  . Occupation: Retired  Tobacco Use  . Smoking status: Former Smoker    Packs/day: 2.00    Years: 64.00    Pack years: 128.00    Types: Cigarettes    Quit date: 07/04/2017    Years since quitting: 2.6  . Smokeless  tobacco: Never Used  . Tobacco comment: SINCE I WAS 79 YRS OLD  Vaping Use  . Vaping Use: Never used  Substance and Sexual Activity  . Alcohol use: No  . Drug use: No  . Sexual activity: Not on file  Other Topics Concern  . Not on file  Social History Narrative   Lives with wife   Caffeine use: some   Right handed   Social Determinants of Health   Financial Resource Strain:   . Difficulty of Paying Living Expenses:   Food Insecurity:   . Worried About Charity fundraiser in the Last Year:   . Arboriculturist in the Last Year:   Transportation Needs:   . Film/video editor (Medical):   Marland Kitchen Lack of Transportation (Non-Medical):   Physical Activity:   . Days of Exercise per Week:   . Minutes of Exercise per Session:   Stress:   . Feeling of Stress :   Social Connections:   . Frequency of Communication with Friends and Family:   . Frequency of Social Gatherings with Friends and Family:   . Attends Religious Services:   . Active Member of Clubs or Organizations:   . Attends Archivist Meetings:   Marland Kitchen Marital Status:   Intimate Partner Violence:   . Fear of Current or Ex-Partner:   . Emotionally Abused:   Marland Kitchen Physically Abused:   . Sexually Abused:    PHYSICAL EXAM  Vitals:   02/12/20 1114  BP: (!) 145/85  Pulse: 80  Weight: 157 lb  (71.2 kg)  Height: 5\' 5"  (1.651 m)   Body mass index is 26.13 kg/m.  Generalized: Well developed, in no acute distress   Neurological examination  Mentation: Alert oriented to time, place, history taking. Follows all commands speech and language fluent Cranial nerve II-XII: Pupils were equal round reactive to light. Extraocular movements were full, visual field were full on confrontational test. Facial sensation and strength were normal. Head turning and shoulder shrug  were normal and symmetric. Motor: The motor testing reveals 5 over 5 strength of all 4 extremities. Good symmetric motor tone is noted throughout.  Sensory: Sensory testing is intact to soft touch on all 4 extremities. No evidence of extinction is noted.  Coordination: Cerebellar testing reveals good finger-nose-finger and heel-to-shin bilaterally.  Gait and station: Gait is slightly wide-based, good stability with cane. Reflexes: Deep tendon reflexes are symmetric and normal bilaterally.   DIAGNOSTIC DATA (LABS, IMAGING, TESTING) - I reviewed patient records, labs, notes, testing and imaging myself where available.  Lab Results  Component Value Date   WBC 6.3 03/29/2018   HGB 9.4 (L) 03/29/2018   HCT 31.3 (L) 03/29/2018   MCV 88.2 03/29/2018   PLT 275 03/29/2018      Component Value Date/Time   NA 138 03/29/2018 0352   NA 139 07/19/2017 1043   K 4.3 03/29/2018 0352   CL 106 03/29/2018 0352   CO2 25 03/29/2018 0352   GLUCOSE 90 03/29/2018 0352   BUN 12 03/29/2018 0352   BUN 18 07/19/2017 1043   CREATININE 0.99 03/29/2018 0352   CALCIUM 8.8 (L) 03/29/2018 0352   PROT 6.2 (L) 03/27/2018 0241   ALBUMIN 3.4 (L) 03/27/2018 0241   AST 19 03/27/2018 0241   ALT 14 03/27/2018 0241   ALKPHOS 131 (H) 03/27/2018 0241   BILITOT 0.5 03/27/2018 0241   GFRNONAA >60 03/29/2018 0352   GFRAA >60 03/29/2018 0352   Lab Results  Component Value Date   CHOL 166 04/10/2018   HDL 50 04/10/2018   LDLCALC 90 04/10/2018    TRIG 131 04/10/2018   CHOLHDL 3.3 04/10/2018   Lab Results  Component Value Date   HGBA1C 5.9 (H) 02/13/2018   Lab Results  Component Value Date   VITAMINB12 221 03/13/2017   Lab Results  Component Value Date   TSH 2.827 03/28/2018      ASSESSMENT AND PLAN 79 y.o. year old male  has a past medical history of Anginal pain ([place]), Coronary artery disease, Critical aortic valve stenosis, Dyspnea, Encephalopathy chronic (02/13/2018), Murmur, Seizure ([place]), Stroke ([place]), and Syncope. here with:  1.  History of seizures 2.  Anxiety  He is doing relatively well on his current medication regimen.  He is now only taking Dilantin and Keppra.  At one point, he was on 4 seizure medications.  Unclear, if he has actually had seizure since last seen, describes episodes that occur during times of anxiety, but can be associated with right hand shaking, no loss of consciousness, he is able to control the episodes.  He wishes to get back on his Zoloft, I will refill Zoloft 50 mg daily for better control of his anxiety.  I will check routine blood work today.  He will remain on Keppra and Dilantin.  He will follow-up in 6 months or sooner if needed.  I spent 30 minutes of face-to-face and non-face-to-face time with patient.  This included previsit chart review, lab review, study review, order entry, electronic health record documentation, patient education.  Butler Denmark, AGNP-C, DNP 02/12/2020, 12:11 PM [facility] [address] [place], [place] [address] [phone]

## 2020-02-14 LAB — COMPREHENSIVE METABOLIC PANEL
ALT: 17 IU/L (ref 0–44)
AST: 21 IU/L (ref 0–40)
Albumin/Globulin Ratio: 1.4 (ref 1.2–2.2)
Albumin: 4 g/dL (ref 3.7–4.7)
Alkaline Phosphatase: 183 IU/L — ABNORMAL HIGH (ref 48–121)
BUN/Creatinine Ratio: 16 (ref 10–24)
BUN: 19 mg/dL (ref 8–27)
Bilirubin Total: <0.2 mg/dL (ref 0.0–1.2)
CO2: 23 mmol/L (ref 20–29)
Calcium: 9.5 mg/dL (ref 8.6–10.2)
Chloride: 100 mmol/L (ref 96–106)
Creatinine, Ser: 1.22 mg/dL (ref 0.76–1.27)
GFR calc Af Amer: 65 mL/min/{1.73_m2} (ref >59)
GFR calc non Af Amer: 56 mL/min/{1.73_m2} — ABNORMAL LOW (ref >59)
Globulin, Total: 2.8 g/dL (ref 1.5–4.5)
Glucose: 97 mg/dL (ref 65–99)
Potassium: 4.8 mmol/L (ref 3.5–5.2)
Sodium: 139 mmol/L (ref 134–144)
Total Protein: 6.8 g/dL (ref 6.0–8.5)

## 2020-02-14 LAB — CBC WITH DIFFERENTIAL/PLATELET
Basophils Absolute: 0 10*3/uL (ref 0.0–0.2)
Basos: 1 %
EOS (ABSOLUTE): 0.6 10*3/uL — ABNORMAL HIGH (ref 0.0–0.4)
Eos: 8 %
Hematocrit: 38.5 % (ref 37.5–51.0)
Hemoglobin: 12.8 g/dL — ABNORMAL LOW (ref 13.0–17.7)
Immature Grans (Abs): 0 10*3/uL (ref 0.0–0.1)
Immature Granulocytes: 1 %
Lymphocytes Absolute: 1.4 10*3/uL (ref 0.7–3.1)
Lymphs: 19 %
MCH: 29.2 pg (ref 26.6–33.0)
MCHC: 33.2 g/dL (ref 31.5–35.7)
MCV: 88 fL (ref 79–97)
Monocytes Absolute: 0.8 10*3/uL (ref 0.1–0.9)
Monocytes: 11 %
Neutrophils Absolute: 4.7 10*3/uL (ref 1.4–7.0)
Neutrophils: 60 %
Platelets: 271 10*3/uL (ref 150–450)
RBC: 4.39 x10E6/uL (ref 4.14–5.80)
RDW: 12.5 % (ref 11.6–15.4)
WBC: 7.5 10*3/uL (ref 3.4–10.8)

## 2020-02-14 LAB — LEVETIRACETAM LEVEL: Levetiracetam Lvl: 46.3 ug/mL — ABNORMAL HIGH (ref 10.0–40.0)

## 2020-02-14 LAB — PHENYTOIN LEVEL, TOTAL: Phenytoin (Dilantin), Serum: 5.9 ug/mL — ABNORMAL LOW (ref 10.0–20.0)

## 2020-02-16 ENCOUNTER — Other Ambulatory Visit: Payer: Self-pay | Admitting: Gastroenterology

## 2020-02-16 ENCOUNTER — Telehealth: Payer: Self-pay

## 2020-02-16 DIAGNOSIS — R569 Unspecified convulsions: Secondary | ICD-10-CM | POA: Diagnosis not present

## 2020-02-16 DIAGNOSIS — K219 Gastro-esophageal reflux disease without esophagitis: Secondary | ICD-10-CM | POA: Diagnosis not present

## 2020-02-16 DIAGNOSIS — R1310 Dysphagia, unspecified: Secondary | ICD-10-CM | POA: Diagnosis not present

## 2020-02-16 NOTE — Telephone Encounter (Addendum)
   LM on the VM for the patient to call re: message below  ----- Message from Suzzanne Cloud, NP sent at 02/16/2020  7:46 AM EDT ----- Labs overall show no significant abnormality. Dilantin level somewhat low, if recurrent seizure, please let us know, make sure taking as prescribed. Dilantin and Keppra were random level draws. Keppra slightly high, wouldn't alter dosing since no adverse effect. Alk phos elevated 183, will continue to follow.

## 2020-02-17 NOTE — Telephone Encounter (Signed)
Pts daughter was contacted and notified of the message from Judson Roch re: lab results Daughter Danny Krause is on the DRP

## 2020-02-27 ENCOUNTER — Other Ambulatory Visit (HOSPITAL_COMMUNITY)
Admission: RE | Admit: 2020-02-27 | Discharge: 2020-02-27 | Disposition: A | Discharge status: Home / Self Care | Payer: PPO | Source: Ambulatory Visit | Attending: Gastroenterology | Admitting: Gastroenterology

## 2020-02-27 DIAGNOSIS — Z20822 Contact with and (suspected) exposure to covid-19: Secondary | ICD-10-CM | POA: Diagnosis not present

## 2020-02-27 DIAGNOSIS — Z01812 Encounter for preprocedural laboratory examination: Secondary | ICD-10-CM | POA: Diagnosis not present

## 2020-02-27 LAB — SARS CORONAVIRUS 2 (TAT 6-24 HRS): SARS Coronavirus 2: NEGATIVE

## 2020-03-01 DIAGNOSIS — K219 Gastro-esophageal reflux disease without esophagitis: Secondary | ICD-10-CM | POA: Diagnosis not present

## 2020-03-01 DIAGNOSIS — R1310 Dysphagia, unspecified: Secondary | ICD-10-CM | POA: Diagnosis not present

## 2020-03-01 NOTE — Progress Notes (Signed)
Pre call completed, spoke with pts daughter, she states he is [facility] and she handles his affairs, questions answered, informed to remain isolated until after procedure r/t covid, informed to be at hospital at 38, daughter to bring pt to and from hospital

## 2020-03-02 ENCOUNTER — Ambulatory Visit (HOSPITAL_COMMUNITY): Payer: PPO | Admitting: Certified Registered Nurse Anesthetist

## 2020-03-02 ENCOUNTER — Encounter (HOSPITAL_COMMUNITY): Payer: Self-pay | Admitting: Gastroenterology

## 2020-03-02 ENCOUNTER — Encounter (HOSPITAL_COMMUNITY)
Admission: RE | Disposition: A | Discharge status: Home / Self Care | Payer: Self-pay | Source: Home / Self Care | Attending: Gastroenterology

## 2020-03-02 ENCOUNTER — Ambulatory Visit (HOSPITAL_COMMUNITY)
Admission: RE | Admit: 2020-03-02 | Discharge: 2020-03-02 | Disposition: A | Discharge status: Home / Self Care | Payer: PPO | Attending: Gastroenterology | Admitting: Gastroenterology

## 2020-03-02 ENCOUNTER — Other Ambulatory Visit: Payer: Self-pay

## 2020-03-02 DIAGNOSIS — G4733 Obstructive sleep apnea (adult) (pediatric): Secondary | ICD-10-CM | POA: Diagnosis not present

## 2020-03-02 DIAGNOSIS — K219 Gastro-esophageal reflux disease without esophagitis: Secondary | ICD-10-CM | POA: Diagnosis not present

## 2020-03-02 DIAGNOSIS — Z8673 Personal history of transient ischemic attack (TIA), and cerebral infarction without residual deficits: Secondary | ICD-10-CM | POA: Diagnosis not present

## 2020-03-02 DIAGNOSIS — I2510 Atherosclerotic heart disease of native coronary artery without angina pectoris: Secondary | ICD-10-CM | POA: Insufficient documentation

## 2020-03-02 DIAGNOSIS — Z951 Presence of aortocoronary bypass graft: Secondary | ICD-10-CM | POA: Insufficient documentation

## 2020-03-02 DIAGNOSIS — Z79899 Other long term (current) drug therapy: Secondary | ICD-10-CM | POA: Insufficient documentation

## 2020-03-02 DIAGNOSIS — Z87891 Personal history of nicotine dependence: Secondary | ICD-10-CM | POA: Diagnosis not present

## 2020-03-02 DIAGNOSIS — Z952 Presence of prosthetic heart valve: Secondary | ICD-10-CM | POA: Insufficient documentation

## 2020-03-02 DIAGNOSIS — R1310 Dysphagia, unspecified: Secondary | ICD-10-CM | POA: Insufficient documentation

## 2020-03-02 DIAGNOSIS — I10 Essential (primary) hypertension: Secondary | ICD-10-CM | POA: Diagnosis not present

## 2020-03-02 DIAGNOSIS — Z7982 Long term (current) use of aspirin: Secondary | ICD-10-CM | POA: Insufficient documentation

## 2020-03-02 DIAGNOSIS — J439 Emphysema, unspecified: Secondary | ICD-10-CM | POA: Insufficient documentation

## 2020-03-02 DIAGNOSIS — Z806 Family history of leukemia: Secondary | ICD-10-CM | POA: Insufficient documentation

## 2020-03-02 DIAGNOSIS — E785 Hyperlipidemia, unspecified: Secondary | ICD-10-CM | POA: Diagnosis not present

## 2020-03-02 DIAGNOSIS — G40909 Epilepsy, unspecified, not intractable, without status epilepticus: Secondary | ICD-10-CM | POA: Insufficient documentation

## 2020-03-02 HISTORY — PX: ESOPHAGOGASTRODUODENOSCOPY (EGD) WITH PROPOFOL: SHX5813

## 2020-03-02 HISTORY — PX: MALONEY DILATION: SHX5535

## 2020-03-02 SURGERY — ESOPHAGOGASTRODUODENOSCOPY (EGD) WITH PROPOFOL
Anesthesia: Monitor Anesthesia Care

## 2020-03-02 MED ORDER — LIDOCAINE 2% (20 MG/ML) 5 ML SYRINGE
INTRAMUSCULAR | Status: DC | PRN
  Administered 2020-03-02: 100 mg via INTRAVENOUS

## 2020-03-02 MED ORDER — LACTATED RINGERS IV SOLN: INTRAVENOUS | Status: DC | PRN

## 2020-03-02 MED ORDER — SODIUM CHLORIDE 0.9 % IV SOLN: INTRAVENOUS | Status: DC

## 2020-03-02 MED ORDER — PROPOFOL 500 MG/50ML IV EMUL
INTRAVENOUS | Status: AC
  Filled 2020-03-02: qty 50

## 2020-03-02 MED ORDER — LACTATED RINGERS IV SOLN: Freq: Once | INTRAVENOUS | Status: AC

## 2020-03-02 MED ORDER — PROPOFOL 10 MG/ML IV BOLUS
INTRAVENOUS | Status: DC | PRN
  Administered 2020-03-02: 20 mg via INTRAVENOUS

## 2020-03-02 MED ORDER — PROPOFOL 500 MG/50ML IV EMUL
INTRAVENOUS | Status: DC | PRN
  Administered 2020-03-02: 100 ug/kg/min via INTRAVENOUS

## 2020-03-02 SURGICAL SUPPLY — 15 items

## 2020-03-02 NOTE — Anesthesia Procedure Notes (Signed)
Procedure Name: MAC Date/Time: 03/02/2020 12:23 PM Performed by: West Pugh, CRNA Pre-anesthesia Checklist: Patient identified, Emergency Drugs available, Suction available, Patient being monitored and Timeout performed Patient Re-evaluated:Patient Re-evaluated prior to induction Oxygen Delivery Method: Simple face mask Preoxygenation: Pre-oxygenation with 100% oxygen Induction Type: IV induction Placement Confirmation: positive ETCO2 Dental Injury: Teeth and Oropharynx as per pre-operative assessment

## 2020-03-02 NOTE — Transfer of Care (Signed)
Immediate Anesthesia Transfer of Care Note  Patient: Danny Krause  Procedure(s) Performed: ESOPHAGOGASTRODUODENOSCOPY (EGD) WITH PROPOFOL (N/A ) [place] DILATION  Patient Location: Endoscopy Unit  Anesthesia Type:MAC  Level of Consciousness: awake, alert  and patient cooperative  Airway & Oxygen Therapy: Patient Spontanous Breathing and Patient connected to face mask oxygen  Post-op Assessment: Report given to RN and Post -op Vital signs reviewed and stable  Post vital signs: Reviewed and stable  Last Vitals:  Vitals Value Taken Time  BP    Temp    Pulse    Resp    SpO2      Last Pain:  Vitals:   03/02/20 1155  TempSrc: Axillary  PainSc: 0-No pain         Complications: No complications documented.

## 2020-03-02 NOTE — Anesthesia Preprocedure Evaluation (Signed)
Anesthesia Evaluation  Patient identified by MRN, date of birth, ID band Patient awake    Reviewed: Allergy & Precautions, NPO status , Patient's Chart, lab work & pertinent test results  Airway Mallampati: II  TM Distance: >3 FB Neck ROM: Full    Dental no notable dental hx.    Pulmonary neg pulmonary ROS, former smoker,    Pulmonary exam normal breath sounds clear to auscultation       Cardiovascular + CAD and + CABG  Normal cardiovascular exam Rhythm:Regular Rate:Normal  S/P AVR   Neuro/Psych negative neurological ROS  negative psych ROS   GI/Hepatic negative GI ROS, Neg liver ROS,   Endo/Other  negative endocrine ROS  Renal/GU negative Renal ROS  negative genitourinary   Musculoskeletal negative musculoskeletal ROS (+)   Abdominal   Peds negative pediatric ROS (+)  Hematology negative hematology ROS (+)   Anesthesia Other Findings   Reproductive/Obstetrics negative OB ROS                             Anesthesia Physical Anesthesia Plan  ASA: III  Anesthesia Plan: MAC   Post-op Pain Management:    Induction: Intravenous  PONV Risk Score and Plan: 0  Airway Management Planned: Simple Face Mask  Additional Equipment:   Intra-op Plan:   Post-operative Plan:   Informed Consent: I have reviewed the patients History and Physical, chart, labs and discussed the procedure including the risks, benefits and alternatives for the proposed anesthesia with the patient or authorized representative who has indicated his/her understanding and acceptance.     Dental advisory given  Plan Discussed with: CRNA and Surgeon  Anesthesia Plan Comments:         Anesthesia Quick Evaluation

## 2020-03-02 NOTE — Discharge Instructions (Signed)

## 2020-03-02 NOTE — Op Note (Signed)
[facility] Patient Name: Danny Krause Procedure Date: 03/02/2020 MRN: [id number] Attending MD: Carol Ada , MD Date of Birth: [birth year] CSN: [id number] Age: 79 Admit Type: Inpatient Procedure:                Upper GI endoscopy Indications:              Dysphagia Providers:                Carol Ada, MD, Doristine Johns, RN, Lina Sar, Technician, Christell Faith, CRNA Referring MD:              Medicines:                Propofol per Anesthesia Complications:            No immediate complications. Estimated Blood Loss:     Estimated blood loss: none. Procedure:                Pre-Anesthesia Assessment:                           - Prior to the procedure, a History and Physical                            was performed, and patient medications and                            allergies were reviewed. The patient's tolerance of                            previous anesthesia was also reviewed. The risks                            and benefits of the procedure and the sedation                            options and risks were discussed with the patient.                            All questions were answered, and informed consent                            was obtained. Prior Anticoagulants: The patient has                            taken no previous anticoagulant or antiplatelet                            agents. ASA Grade Assessment: III - A patient with                            severe systemic disease. After reviewing the risks  and benefits, the patient was deemed in                            satisfactory condition to undergo the procedure.                           - Sedation was administered by an anesthesia                            professional. Deep sedation was attained.                           After obtaining informed consent, the endoscope was                            passed under direct  vision. Throughout the                            procedure, the patient's blood pressure, pulse, and                            oxygen saturations were monitored continuously. The                            GIF-H190 ([id number]) Olympus gastroscope was                            introduced through the mouth, and advanced to the                            second part of duodenum. The upper GI endoscopy was                            accomplished without difficulty. The patient                            tolerated the procedure well. Scope In: 12:30:53 PM Scope Out: 12:34:55 PM Total Procedure Duration: 0 hours 4 minutes 2 seconds  Findings:      No endoscopic abnormality was evident in the esophagus to explain the       patient's complaint of dysphagia. It was decided, however, to proceed       with dilation of the entire esophagus. The scope was withdrawn. Dilation       was performed with a Maloney dilator with no resistance at 39 Fr. The       dilation site was examined following endoscope reinsertion and showed no       change.      The stomach was normal.      The examined duodenum was normal. Impression:               - No endoscopic esophageal abnormality to explain                            patient's dysphagia. Esophagus dilated. Dilated.                           -  Normal stomach.                           - Normal examined duodenum.                           - No specimens collected. Moderate Sedation:      Not Applicable - Patient had care per Anesthesia. Recommendation:           - Patient has a contact number available for                            emergencies. The signs and symptoms of potential                            delayed complications were discussed with the                            patient. Return to normal activities tomorrow.                            Written discharge instructions were provided to the                            patient.                            - Resume previous diet.                           - Continue present medications.                           - Schedule manometry, unless dysphagia resolves                            with the dilation.                           - Follow up in one month. Procedure Code(s):        --- Professional ---                           [phone], Esophagogastroduodenoscopy, flexible,                            transoral; diagnostic, including collection of                            specimen(s) by brushing or washing, when performed                            (separate procedure)                           25053, Dilation of esophagus, by unguided sound or  bougie, single or multiple passes Diagnosis Code(s):        --- Professional ---                           R13.10, Dysphagia, unspecified CPT copyright 2019 American Medical Association. All rights reserved. The codes documented in this report are preliminary and upon coder review may  be revised to meet current compliance requirements. Carol Ada, MD Carol Ada, MD 03/02/2020 12:39:04 PM This report has been signed electronically. Number of Addenda: 0

## 2020-03-02 NOTE — H&P (Signed)
  Danny Krause HPI: Four to 5 months ago he experienced dysphagia to solid food. Since that time he continues to have issues with dysphagia to both solids and liquids. Recently, his daughter started him on Prilosec and she notices an improvement in his dysphagia symptoms. His daughter feels that this anxiety is driving the dysphagia as the first episode was rather severe and he was alone. The patient was hospitalized for 2 months for status epilepticus and this required a tracheostomy.  Past Medical History:  Diagnosis Date  . Anginal pain ([place])    with exertion  . Coronary artery disease   . Critical aortic valve stenosis   . Dyspnea    with exertion  . 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 Murmur   . Seizure ([place])   . Stroke ([place])   . Syncope     Past Surgical History:  Procedure Laterality Date  . AORTIC VALVE REPLACEMENT N/A 07/04/2017   Procedure: AORTIC VALVE REPLACEMENT (AVR);  Surgeon: Danny Isaac, MD;  Location: [place];  Service: Open Heart Surgery;  Laterality: N/A;  . CORONARY ARTERY BYPASS GRAFT N/A 07/04/2017   Procedure: CORONARY ARTERY BYPASS GRAFTING (CABG) x three, using left internal mammary artery and right leg greater saphenous vein harvested endoscopically;  Surgeon: Danny Isaac, MD;  Location: [place];  Service: Open Heart Surgery;  Laterality: N/A;  . NO PAST SURGERIES    . RIGHT/LEFT HEART CATH AND CORONARY ANGIOGRAPHY N/A 06/25/2017   Procedure: RIGHT/LEFT HEART CATH AND CORONARY ANGIOGRAPHY;  Surgeon: Danny Sine, MD;  Location: [place] [facility];  Service: Cardiovascular;  Laterality: N/A;  . TEE WITHOUT CARDIOVERSION N/A 07/04/2017   Procedure: TRANSESOPHAGEAL ECHOCARDIOGRAM (TEE);  Surgeon: Danny Isaac, MD;  Location: [place];  Service: Open Heart Surgery;  Laterality: N/A;  . THORACIC AORTOGRAM  06/25/2017   Procedure: THORACIC AORTOGRAM;  Surgeon: Danny Sine, MD;  Location: [place] [facility];  Service: Cardiovascular;;   aortic root   . TRACHEOSTOMY CLOSURE  02/2018    Family History  Problem Relation Age of Onset  . Hernia Father   . Stroke Brother     Social History:  reports that he quit smoking about 2 years ago. His smoking use included cigarettes. He has a 128.00 pack-year smoking history. He has never used smokeless tobacco. He reports that he does not drink alcohol and does not use drugs.  Allergies: No Known Allergies  Medications:  Scheduled:  Continuous: . sodium chloride      No results found for this or any previous visit (from the past 24 hour(s)).   No results found.  ROS:  As stated above in the HPI otherwise negative.  Blood pressure 113/65, temperature 97.7 F (36.5 C), temperature source Axillary, resp. rate 18, height 5\' 5"  (1.651 m), weight 69.9 kg, SpO2 96 %.    PE: Gen: NAD, Alert and Oriented HEENT:  [place]/AT, EOMI Neck: Supple, no LAD Lungs: CTA Bilaterally CV: RRR without M/G/R ABD: Soft, NTND, +BS Ext: No C/C/E  Assessment/Plan: 1) Dysphagia - EGD with dilation.  Danny Krause D 03/02/2020, 12:16 PM

## 2020-03-03 NOTE — Anesthesia Postprocedure Evaluation (Signed)
Anesthesia Post Note  Patient: STANDLEY BARGO  Procedure(s) Performed: ESOPHAGOGASTRODUODENOSCOPY (EGD) WITH PROPOFOL (N/A ) [place] DILATION     Patient location during evaluation: PACU Anesthesia Type: MAC Level of consciousness: awake and alert Pain management: pain level controlled Vital Signs Assessment: post-procedure vital signs reviewed and stable Respiratory status: spontaneous breathing, nonlabored ventilation, respiratory function stable and patient connected to nasal cannula oxygen Cardiovascular status: stable and blood pressure returned to baseline Postop Assessment: no apparent nausea or vomiting Anesthetic complications: no   No complications documented.  Last Vitals:  Vitals:   03/02/20 1300 03/02/20 1310  BP: 130/76 (!) 131/94  Pulse: (!) 59 63  Resp: 16 (!) 24  Temp:    SpO2: 94% 96%    Last Pain:  Vitals:   03/02/20 1310  TempSrc:   PainSc: 0-No pain                 Daking Westervelt S

## 2020-03-06 ENCOUNTER — Inpatient Hospital Stay (HOSPITAL_COMMUNITY): Admission: RE | Admit: 2020-03-06 | Payer: PPO | Source: Ambulatory Visit

## 2020-03-09 ENCOUNTER — Other Ambulatory Visit: Payer: Self-pay

## 2020-03-09 ENCOUNTER — Ambulatory Visit (INDEPENDENT_AMBULATORY_CARE_PROVIDER_SITE_OTHER): Payer: PPO | Admitting: Pulmonary Disease

## 2020-03-09 DIAGNOSIS — J449 Chronic obstructive pulmonary disease, unspecified: Secondary | ICD-10-CM | POA: Diagnosis not present

## 2020-03-09 LAB — PULMONARY FUNCTION TEST
DL/VA % pred: 57 %
DL/VA: 2.27 ml/min/mmHg/L
DLCO cor % pred: 50 %
DLCO cor: 11.38 ml/min/mmHg
DLCO unc % pred: 48 %
DLCO unc: 10.75 ml/min/mmHg
FEF 25-75 Post: 0.35 L/sec
FEF 25-75 Pre: 0.55 L/sec
FEF2575-%Change-Post: -37 %
FEF2575-%Pred-Post: 19 %
FEF2575-%Pred-Pre: 31 %
FEV1-%Change-Post: -16 %
FEV1-%Pred-Post: 42 %
FEV1-%Pred-Pre: 51 %
FEV1-Post: 1.09 L
FEV1-Pre: 1.3 L
FEV1FVC-%Change-Post: -5 %
FEV1FVC-%Pred-Pre: 74 %
FEV6-%Change-Post: -12 %
FEV6-%Pred-Post: 63 %
FEV6-%Pred-Pre: 72 %
FEV6-Post: 2.1 L
FEV6-Pre: 2.4 L
FEV6FVC-%Change-Post: 0 %
FEV6FVC-%Pred-Post: 105 %
FEV6FVC-%Pred-Pre: 106 %
FVC-%Change-Post: -11 %
FVC-%Pred-Post: 59 %
FVC-%Pred-Pre: 67 %
FVC-Post: 2.13 L
FVC-Pre: 2.42 L
Post FEV1/FVC ratio: 51 %
Post FEV6/FVC ratio: 98 %
Pre FEV1/FVC ratio: 54 %
Pre FEV6/FVC Ratio: 99 %
RV % pred: 256 %
RV: 6.33 L
TLC % pred: 143 %
TLC: 9.26 L

## 2020-03-09 NOTE — Progress Notes (Signed)
Virtual Visit via Telephone Note  I connected with Danny Krause on 03/10/20 at 12:00 PM EDT by telephone and verified that I am speaking with the correct person using two identifiers.  Location: Patient: Home Provider: [facility] - [address], [place], [place], [place] [address]   I discussed the limitations, risks, security and privacy concerns of performing an evaluation and management service by telephone and the availability of in person appointments. I also discussed with the patient that there may be a patient responsible charge related to this service. The patient expressed understanding and agreed to proceed.  Patient consented to consult via telephone: Yes People present and their role in pt care: Pt    History of Present Illness:  79 year old male former smoker (quit 06/2017) initially referred to our office in 2019 for dyspnea and COPD.  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  Quit 06/2017.  128-pack-year smoking history. Maintenance: Symbicort 160 Pt of: Dr. Halford Chessman   Chief complaint: Follow-up after pulmonary function test  79 year old male former smoker followed in our office for COPD as well as obstructive sleep apnea.  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 Halford Chessman patient has received a new mask for his CPAP.  CPAP compliance report listed below still showing leaks as well as poorly controlled AHI and:  02/08/2020-03/08/2020-30 had a last 30 days use, all 30 those days greater than 4 hours, average usage 9 hours and 29 minutes, APAP setting 5-15, 95th percentile 9.4, AHI 16.2, central apneas 6.4, obstructive 2.0,  unknown 7.3, leaks every day on report   Patient continues to follow with neurology.  Patient is managed on Dilantin as well as Keppra.  Spoke with patient's daughter who reports that she feels patient is coughing more and is producing more mucus.  She is unsure if his shortness of breath is worsening.  She reports the patient uses CPAP every night but he struggles with tolerating this.  They just recently had a work-up with gastroenterology where the patient was significantly anxious with going into the hospital.  Was able to also discuss with patient his symptoms.  He feels that his breathing is at baseline.  He does feel he is having increased mucus production but he also feels that this is baseline and is clear.  He denies any discolored mucus or wheezing.  He denies worsened dyspnea.  We reviewed his CPAP use.  He reports that he moves around a lot when he sleeping.  He is not interested in proceeding forward with a CPAP titration at this time.  He has plans to follow-up with neurology to see if they can decrease any of his medications.  He feels that he is taking too many medications at this time.  Observations/Objective:  05/30/18 with moderate obstructive sleep apnea.  AHI 15.2, SpO2 low 82%.  03/27/2018-CT chest without contrast- emphysema, upper lobe predominant, no pulmonary nodule identified, trace left pleural effusion  02/11/2018-swallow study- dysphagia, mild aspiration risk  PFT: 06/2017 : FEV1 1.41L (53% pre), DLCO 8.35 ml (29%)  Imaging:  12/20/2017-chest x-ray- mild right basilar atelectasis 07/02/2017-CT Angio chest with contrast- no evidence of thoracic aortic aneurysm, aortic arthrosclerosis, 1.3 cm solid nodule in left lower lobe, 1.7 cm solid nodule  in the right lower lobe  Cardiac:  12/19/2017-echocardiogram- LV ejection fraction 55 to 41%, grade 1 diastolic dysfunction    Social History   Tobacco Use  Smoking Status Former Smoker  . Packs/day: 2.00  . Years:  64.00  . Pack years: 128.00  . Types: Cigarettes  . Quit date: 07/04/2017  . Years since quitting: 2.6  Smokeless Tobacco Never Used  Tobacco Comment   SINCE I WAS 79 YRS OLD   Immunization History  Administered Date(s) Administered  . Influenza, High Dose Seasonal PF 07/08/2017, 06/26/2018  . Pneumococcal Conjugate-13 06/19/2016  . Pneumococcal Polysaccharide-23 06/04/2018  . Tdap 03/13/2017      Assessment and Plan:  COPD, severe ([place]) Reviewed pulmonary function testing with patient Reviewed 2018 and 2021 pulmonary function testing with patient as well as patient's daughter July 2018 CT without contrast shows emphysema  Plan: Hold Symbicort 160 Trial of Breztri  Follow-up in 6 weeks Would consider pulmonary rehab referral in the future when patient is comfortable with presenting to the hospital for regular follow-up, he is not at this time   OSA (obstructive sleep apnea) Reviewed CPAP compliance report CPAP compliance report with new mask still showing significant leaks Poorly controlled AHI Patient reports discomfort using a CPAP  Discussion: Reviewed with patient that based off of his CPAP compliance report would be beneficial for him to have a CPAP titration in the sleep lab.  He refuses to do this today.  He reports that he has no interest in presenting back to the hospital for additional testing.  He understands as we have discussed that inadequate treatment therapies with CPAP or stopping CPAP therapy altogether does put him at increased risk for health events such as increased strain and pressure on the heart as well as increased daytime sleepiness and fatigue.  Plan: Patient to continue CPAP at this time He will discuss CPAP therapy options with his daughter We will review in 6 weeks If he makes a decision on either stopping CPAP therapy or proceeding forward with CPAP titration he will contact our office and let us know   Follow Up Instructions:  Return  in about 6 weeks (around 04/21/2020), or if symptoms worsen or fail to improve, for Follow up with Wyn Quaker FNP-C, Follow up with Dr. Halford Chessman.   I discussed the assessment and treatment plan with the patient. The patient was provided an opportunity to ask questions and all were answered. The patient agreed with the plan and demonstrated an understanding of the instructions.   The patient was advised to call back or seek an in-person evaluation if the symptoms worsen or if the condition fails to improve as anticipated.  I provided 46 minutes of non-face-to-face time during this encounter.   Lauraine Rinne, NP

## 2020-03-09 NOTE — Progress Notes (Signed)
Full PFT performed today. °

## 2020-03-10 ENCOUNTER — Encounter: Payer: Self-pay | Admitting: Pulmonary Disease

## 2020-03-10 ENCOUNTER — Ambulatory Visit (INDEPENDENT_AMBULATORY_CARE_PROVIDER_SITE_OTHER): Payer: PPO | Admitting: Pulmonary Disease

## 2020-03-10 DIAGNOSIS — J449 Chronic obstructive pulmonary disease, unspecified: Secondary | ICD-10-CM

## 2020-03-10 DIAGNOSIS — G4733 Obstructive sleep apnea (adult) (pediatric): Secondary | ICD-10-CM

## 2020-03-10 NOTE — Progress Notes (Signed)
Reviewed and agree with assessment/plan.   Chesley Mires, MD [facility] 03/10/2020, 1:10 PM Pager:  [phone]

## 2020-03-10 NOTE — Assessment & Plan Note (Signed)
Reviewed pulmonary function testing with patient Reviewed 2018 and 2021 pulmonary function testing with patient as well as patient's daughter July 2018 CT without contrast shows emphysema  Plan: Hold Symbicort 160 Trial of Breztri  Follow-up in 6 weeks Would consider pulmonary rehab referral in the future when patient is comfortable with presenting to the hospital for regular follow-up, he is not at this time

## 2020-03-10 NOTE — Patient Instructions (Addendum)
You were seen today by Lauraine Rinne, NP  for:   1. COPD, severe ([place])  Trial of Breztri >>> 2 puffs in the morning right when you wake up, rinse out your mouth after use, 12 hours later 2 puffs, rinse after use >>> Take this daily, no matter what >>> This is not a rescue inhaler   HOLD symbicort 160 when taking breztri   Note your daily symptoms > remember "red flags" for COPD:   >>>Increase in cough >>>increase in sputum production >>>increase in shortness of breath or activity  intolerance.   If you notice these symptoms, please call the office to be seen.   We would recommend pulmonary rehab if you become interested in this this would require you going to the hospital 2 times a week for outpatient exercise to help with your breathing  2. OSA (obstructive sleep apnea)  It is our recommendations that you proceed forward with a CPAP titration study, you declined this today  Please discuss this with your family if you become interested in proceeding forward with a CPAP titration study please contact our office  We recommend that you continue using your CPAP daily >>>Keep up the hard work using your device >>> Goal should be wearing this for the entire night that you are sleeping, at least 4 to 6 hours  Remember:  . Do not drive or operate heavy machinery if tired or drowsy.  . Please notify the supply company and office if you are unable to use your device regularly due to missing supplies or machine being broken.  . Work on maintaining a healthy weight and following your recommended nutrition plan  . Maintain proper daily exercise and movement  . Maintaining proper use of your device can also help improve management of other chronic illnesses such as: Blood pressure, blood sugars, and weight management.   BiPAP/ CPAP Cleaning:  >>>Clean weekly, with Dawn soap, and bottle brush.  Set up to air dry. >>> Wipe mask out daily with wet wipe or towelette     We encourage you to  follow-up with neurology and also discussed your issues with adequate control in your CPAP therapy.  You can also discuss your concerns with them regarding the amount of medications that you are taking for your seizures.   Follow Up:    Return in about 6 weeks (around 04/21/2020), or if symptoms worsen or fail to improve, for Follow up with Wyn Quaker FNP-C, Follow up with Dr. Halford Chessman.   Please do your part to reduce the spread of COVID-19:      Reduce your risk of any infection  and COVID19 by using the similar precautions used for avoiding the common cold or flu:  Marland Kitchen Wash your hands often with soap and warm water for at least 20 seconds.  If soap and water are not readily available, use an alcohol-based hand sanitizer with at least 60% alcohol.  . If coughing or sneezing, cover your mouth and nose by coughing or sneezing into the elbow areas of your shirt or coat, into a tissue or into your sleeve (not your hands). Langley Gauss A MASK when in public  . Avoid shaking hands with others and consider head nods or verbal greetings only. . Avoid touching your eyes, nose, or mouth with unwashed hands.  . Avoid close contact with people who are sick. . Avoid places or events with large numbers of people in one location, like concerts or sporting events. . If you  have some symptoms but not all symptoms, continue to monitor at home and seek medical attention if your symptoms worsen. . If you are having a medical emergency, call 911.   Friendly / e-Visit: [email or web address]         [facility]: [facility]: [phone]                   [facility]: [phone]     It is flu season:   >>> Best ways to protect herself from the flu: Receive the yearly flu vaccine, practice good hand hygiene washing with soap and also using hand sanitizer when  available, eat a nutritious meals, get adequate rest, hydrate appropriately   Please contact the office if your symptoms worsen or you have concerns that you are not improving.   Thank you for choosing [facility] for your healthcare, and for allowing [place] to partner with you on your healthcare journey. I am thankful to be able to provide care to you today.   Wyn Quaker FNP-C

## 2020-03-10 NOTE — Assessment & Plan Note (Signed)
Reviewed CPAP compliance report CPAP compliance report with new mask still showing significant leaks Poorly controlled AHI Patient reports discomfort using a CPAP  Discussion: Reviewed with patient that based off of his CPAP compliance report would be beneficial for him to have a CPAP titration in the sleep lab.  He refuses to do this today.  He reports that he has no interest in presenting back to the hospital for additional testing.  He understands as we have discussed that inadequate treatment therapies with CPAP or stopping CPAP therapy altogether does put him at increased risk for health events such as increased strain and pressure on the heart as well as increased daytime sleepiness and fatigue.  Plan: Patient to continue CPAP at this time He will discuss CPAP therapy options with his daughter We will review in 6 weeks If he makes a decision on either stopping CPAP therapy or proceeding forward with CPAP titration he will contact our office and let us know

## 2020-03-18 ENCOUNTER — Other Ambulatory Visit: Payer: Self-pay | Admitting: Internal Medicine

## 2020-03-28 ENCOUNTER — Other Ambulatory Visit: Payer: Self-pay | Admitting: Internal Medicine

## 2020-03-29 NOTE — Telephone Encounter (Signed)
°*  STAT* If patient is at the pharmacy, call can be transferred to refill team.   1. Which medications need to be refilled? (please list name of each medication and dose if known) atorvastatin (LIPITOR) 40 MG tablet  2. Which pharmacy/location (including street and city if local pharmacy) is medication to be sent to? [facility] [phone] - [place], [place]  3. Do they need a 30 day or 90 day supply? 90 day     PATIENT ONLY HAS ONE DAY LEFT.

## 2020-03-30 ENCOUNTER — Other Ambulatory Visit: Payer: Self-pay

## 2020-03-30 NOTE — Telephone Encounter (Signed)
Rx(s) sent to pharmacy electronically.  

## 2020-03-31 ENCOUNTER — Telehealth: Payer: Self-pay | Admitting: Pulmonary Disease

## 2020-03-31 NOTE — Telephone Encounter (Signed)
Tried calling the pt- line rings multiple times and then changes to busy signal

## 2020-04-01 NOTE — Telephone Encounter (Signed)
ATC pt, line rang several times then went to a busy signal. Will try back.

## 2020-04-05 ENCOUNTER — Other Ambulatory Visit: Payer: Self-pay | Admitting: Pulmonary Disease

## 2020-04-05 NOTE — Telephone Encounter (Signed)
ATC patient X3 LMTCB per protocol will close out this encounter.

## 2020-04-08 ENCOUNTER — Telehealth: Payer: Self-pay | Admitting: Pulmonary Disease

## 2020-04-08 MED ORDER — BREZTRI AEROSPHERE 160-9-4.8 MCG/ACT IN AERO: 2.0000 | INHALATION_SPRAY | Freq: Two times a day (BID) | RESPIRATORY_TRACT | 11 refills | Status: DC

## 2020-04-08 NOTE — Telephone Encounter (Signed)
Spoke with the pt's daughter  She states that the pt feels that the breztri is helping  Prefers this over symbicort  Would like rx  Rx sent to pharm  Nothing further needed

## 2020-04-10 ENCOUNTER — Other Ambulatory Visit: Payer: Self-pay | Admitting: Internal Medicine

## 2020-04-12 ENCOUNTER — Other Ambulatory Visit: Payer: Self-pay | Admitting: Internal Medicine

## 2020-04-14 ENCOUNTER — Other Ambulatory Visit: Payer: Self-pay | Admitting: Internal Medicine

## 2020-04-14 MED ORDER — HYDRALAZINE HCL 10 MG PO TABS: ORAL_TABLET | ORAL | 0 refills | Status: DC

## 2020-04-14 MED ORDER — LOSARTAN POTASSIUM 100 MG PO TABS: ORAL_TABLET | ORAL | 0 refills | Status: DC

## 2020-04-14 NOTE — Telephone Encounter (Signed)
*  STAT* If patient is at the pharmacy, call can be transferred to refill team.   1. Which medications need to be refilled? (please list name of each medication and dose if known) hydrALAZINE (APRESOLINE) 10 MG tablet losartan (COZAAR) 100 MG tablet 2. Which pharmacy/location (including street and city if local pharmacy) is medication to be sent to? [id number] - [place], [place]  3. Do they need a 30 day or 90 day supply? 90 day supply  Patient's daughter scheduled an appointment for 06/18/20 at 3:00 PM with Dr. Debara Pickett.

## 2020-04-16 ENCOUNTER — Other Ambulatory Visit: Payer: Self-pay | Admitting: Internal Medicine

## 2020-04-20 ENCOUNTER — Other Ambulatory Visit: Payer: Self-pay | Admitting: Neurology

## 2020-04-28 ENCOUNTER — Other Ambulatory Visit: Payer: Self-pay | Admitting: Pulmonary Disease

## 2020-05-06 ENCOUNTER — Other Ambulatory Visit: Payer: Self-pay | Admitting: Neurology

## 2020-05-19 ENCOUNTER — Telehealth: Payer: Self-pay | Admitting: Internal Medicine

## 2020-05-19 ENCOUNTER — Other Ambulatory Visit: Payer: Self-pay | Admitting: Internal Medicine

## 2020-05-19 MED ORDER — HYDRALAZINE HCL 10 MG PO TABS: ORAL_TABLET | ORAL | 0 refills | Status: DC

## 2020-05-19 NOTE — Telephone Encounter (Signed)
*  STAT* If patient is at the pharmacy, call can be transferred to refill team.   1. Which medications need to be refilled? (please list name of each medication and dose if known)   hydrALAZINE (APRESOLINE) 10 MG tablet  2. Which pharmacy/location (including street and city if local pharmacy) is medication to be sent to? [facility] [phone] - [place], [place]  3. Do they need a 30 day or 90 day supply? 90   Patient only has enough medication for today.

## 2020-05-19 NOTE — Telephone Encounter (Signed)
Refill request approved(telephone call to pharmacy) apresoline 10 mg take 1 tablet 3 times daily.  Approved 30 day supply message left for patient to keep October appt. Georgana Curio MHA RN CCM

## 2020-05-20 ENCOUNTER — Other Ambulatory Visit: Payer: Self-pay | Admitting: Internal Medicine

## 2020-06-11 ENCOUNTER — Other Ambulatory Visit: Payer: Self-pay | Admitting: Internal Medicine

## 2020-06-11 MED ORDER — LOSARTAN POTASSIUM 100 MG PO TABS: ORAL_TABLET | ORAL | 0 refills | Status: DC

## 2020-06-11 NOTE — Telephone Encounter (Signed)
*  STAT* If patient is at the pharmacy, call can be transferred to refill team.   1. Which medications need to be refilled? (please list name of each medication and dose if known) losartan (COZAAR) 100 MG tablet  2. Which pharmacy/location (including street and city if local pharmacy) is medication to be sent to? [facility] [phone] - [place], [place]  3. Do they need a 30 day or 90 day supply? 90 day

## 2020-06-18 ENCOUNTER — Encounter: Payer: Self-pay | Admitting: Internal Medicine

## 2020-06-18 ENCOUNTER — Other Ambulatory Visit: Payer: Self-pay

## 2020-06-18 ENCOUNTER — Ambulatory Visit: Payer: PPO | Admitting: Internal Medicine

## 2020-06-18 VITALS — BP 118/72 | HR 77 | Ht 65.0 in | Wt 163.0 lb

## 2020-06-18 DIAGNOSIS — E785 Hyperlipidemia, unspecified: Secondary | ICD-10-CM | POA: Diagnosis not present

## 2020-06-18 DIAGNOSIS — J449 Chronic obstructive pulmonary disease, unspecified: Secondary | ICD-10-CM | POA: Diagnosis not present

## 2020-06-18 DIAGNOSIS — Z951 Presence of aortocoronary bypass graft: Secondary | ICD-10-CM

## 2020-06-18 DIAGNOSIS — Z952 Presence of prosthetic heart valve: Secondary | ICD-10-CM | POA: Diagnosis not present

## 2020-06-18 MED ORDER — HYDRALAZINE HCL 10 MG PO TABS: ORAL_TABLET | ORAL | 3 refills | Status: DC

## 2020-06-18 MED ORDER — ATORVASTATIN CALCIUM 40 MG PO TABS: 40.0000 mg | ORAL_TABLET | Freq: Every day | ORAL | 3 refills | Status: DC

## 2020-06-18 NOTE — Progress Notes (Signed)
OFFICE CONSULT NOTE  Chief Complaint: Routine follow-up  Primary Care Physician: Jani Gravel, MD  HPI:  Danny Krause is a 79 y.o. male who is being seen today for the evaluation of murmur at the request of Dr. Jani Gravel. Danny Krause is a pleasant 79 yo male whose wife is a patient of mine. Recently had a syncopal episode. He was doing some work outside in [employer]. He apparently passed out although this was unwitnessed. He was found down and thought possibly had a stroke. When EMS arrived they were planning to take him to the hospital and then he had a seizure. He apparently had a status post episode and eventually was broken of that seizure. He was placed on Keppra and has a follow-up with the neurologist next month. He denies any chest pain but does get short of breath. He is a 50 pack year smoker and continues to smoke. He reports when he was in his 39s he had workup for chest pain which included an evaluation that demonstrated a heart murmur. He was told that he should take antibiotics prior to dental visits but nothing more was made out of it. He did not follow-up with a doctor since then. He's not a fan of doctors, although we have significantly helped his wife.  06/01/2017  Danny Krause returns today for follow-up. He reports his wife is actually back in the hospital now. He seems to be under a lot of stress. Unfortunately the echo demonstrated severe aortic stenosis. This likely the cause of his syncopal episode and seizure. Mean gradient across the aortic valve is 87 mmHg with a calculated aortic valve area 0.4 cm. LVEF is 55-60% with normal wall motion and grade 1 diastolic dysfunction. I discussed this with him today and he seemed quite upset with the news. He understands that he will need aortic valve surgery. Given his lack of other comorbidities, I think EF she would be a good candidate for traditional aortic valve replacement, likely with a bioprosthetic valve. He will need  a heart catheterization prior to this. Right now since his wife's in the hospital, he wants to wait to schedule it until next week.  09/21/2017  Danny Krause returns today for hospital follow-up.  He reports worsening shortness of breath.  He has recently had a productive cough and rattle.  He has a history of smoking and recently stopped with his surgery.  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  He is on no therapy for this.  He is not had follow-up with his primary care provider.  He seems somewhat upset with inability to make an appointment with the office and is requesting names of other primary care providers for me today.  He denies any wheezing but gets short of breath with exertion and has a nonproductive cough.  He denies any significant weight gain, orthopnea or lower extremity edema.  He had an uneventful cardiac surgery and underwent aortic valve replacement and three-vessel coronary artery bypass grafting by Dr. Servando Snare on 07/04/2017, with placement of a 23 mm [employer] pericardial tissue valve and LIMA to the LAD, SVG to the diagonal and SVG to the distal right coronary.  He denies any anginal symptoms.  Postoperative echocardiography demonstrated normal valve function.  Danny Krause is significantly anxious and says he is not sleeping well at night.  His wife is been ill and is back in the hospital again with a leg infection.  He's concerned that she may need amputation.  12/26/2017  Danny Krause returns today for hospital follow-up.  Unfortunately recently had an episode of seizure.  This was likely due to hypercarbic respiratory failure.  His PCO2 was apparently over 100 and took several days to normalize.  He was intubated and eventually weaned off of the ventilator.  He had seizure activity as well and his Keppra has been increased.  He has a follow-up with pulmonary to see Dr. Lake Bells in May.  From a cardiac standpoint  he is been very stable after placement of his pericardial tissue valve and multivessel bypass.  He denies any recurrent chest pain.  Blood pressure is elevated today however at 177/85.  His son says his blood pressures been persistently elevated over the past several weeks.  04/10/2018  Danny Krause returns today for follow-up.  Unfortunately is been hospitalized several times since I last saw him.  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  He is currently on 4 antiepileptic medications.  The focus of his seizure seems to be from his stroke which he suffered in conjunction with severe aortic stenosis.  He subsequently undergone bioprosthetic AVR and coronary artery bypass grafting in 06/2017, which has been stable.  He has no complaints of chest pain or shortness of breath.  Blood pressure initially was elevated today 169/82 but came down to 124/86 and a recheck.  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  He is not had a recent lipid profile and is on low-dose atorvastatin 10 mg.  10/07/2018  Danny Krause returns today for follow-up.  He is done well medically from a cardiac standpoint.  He denies any chest pain or significant worsening shortness of breath.  He does have COPD and occasionally has days where he is somewhat more breathless and wheezy.  He seems to have had his seizures under control now.  He is on 3 medications for that and is followed by Dr. Jannifer Franklin.  EKG today shows sinus rhythm with first-degree AV block at 65.  His last echo in April 2019 showed normal LV function with a normally functioning bioprosthetic AV valve and acceptable gradients.  Recent lipid profile does show an improvement with total cholesterol of 166, triglycerides 131, HDL 50 and LDL of 90.  This is partially due to medication and the fact that he has had weight loss due to eating less.  He is also grieving at the loss of his wife who is also a patient of  mine in October of last year.  06/18/2020  Danny Krause is seen today in follow-up.  Overall he seems to be doing well.  He occasionally gets some shortness of breath and wheezing which I think is related to his COPD.  He denies any chest pain.  He is now several years out from aortic valve replacement with a bioprosthetic.  He uses prophylaxis for dental procedures.  He has been followed by neurology for seizures and recently felt like he was overmedicated.  Medicines were therefore adjusted and he has follow-up coming up in December.  PMHx:  Past Medical History:  Diagnosis Date  . Anginal pain ([place])    with exertion  . Coronary artery disease   . Critical aortic valve stenosis   . Dyspnea    with exertion  . 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 Murmur   . Seizure ([place])   . Stroke ([place])   . Syncope  Past Surgical History:  Procedure Laterality Date  . AORTIC VALVE REPLACEMENT N/A 07/04/2017   Procedure: AORTIC VALVE REPLACEMENT (AVR);  Surgeon: Grace Isaac, MD;  Location: [facility];  Service: Open Heart Surgery;  Laterality: N/A;  . CORONARY ARTERY BYPASS GRAFT N/A 07/04/2017   Procedure: CORONARY ARTERY BYPASS GRAFTING (CABG) x three, using left internal mammary artery and right leg greater saphenous vein harvested endoscopically;  Surgeon: Grace Isaac, MD;  Location: [facility];  Service: Open Heart Surgery;  Laterality: N/A;  . ESOPHAGOGASTRODUODENOSCOPY (EGD) WITH PROPOFOL N/A 03/02/2020   Procedure: ESOPHAGOGASTRODUODENOSCOPY (EGD) WITH PROPOFOL;  Surgeon: Carol Ada, MD;  Location: [facility];  Service: Endoscopy;  Laterality: N/A;  Venia Minks DILATION  03/02/2020   Procedure: Venia Minks DILATION;  Surgeon: Carol Ada, MD;  Location: [facility];  Service: Endoscopy;;  . NO PAST SURGERIES    . RIGHT/LEFT HEART CATH AND CORONARY ANGIOGRAPHY N/A 06/25/2017   Procedure: RIGHT/LEFT HEART CATH AND CORONARY ANGIOGRAPHY;  Surgeon: Troy Sine, MD;  Location: [facility];  Service: Cardiovascular;  Laterality: N/A;  . TEE WITHOUT CARDIOVERSION N/A 07/04/2017   Procedure: TRANSESOPHAGEAL ECHOCARDIOGRAM (TEE);  Surgeon: Grace Isaac, MD;  Location: [facility];  Service: Open Heart Surgery;  Laterality: N/A;  . THORACIC AORTOGRAM  06/25/2017   Procedure: THORACIC AORTOGRAM;  Surgeon: Troy Sine, MD;  Location: [facility];  Service: Cardiovascular;;  aortic root   . TRACHEOSTOMY CLOSURE  02/2018    FAMHx:  Family History  Problem Relation Age of Onset  . Hernia Father   . Stroke Brother     SOCHx:   reports that he quit smoking about 2 years ago. His smoking use included cigarettes. He has a 128.00 pack-year smoking history. He has never used smokeless tobacco. He reports that he does not drink alcohol and does not use drugs.  ALLERGIES:  No Known Allergies  ROS: Pertinent items noted in HPI and remainder of comprehensive ROS otherwise negative.  HOME MEDS: Current Outpatient Medications on File Prior to Visit  Medication Sig Dispense Refill  . acetaminophen (TYLENOL) 325 MG tablet Take 325 mg by mouth every 6 (six) hours as needed for mild pain, fever or headache.     . albuterol (VENTOLIN HFA) 108 (90 Base) MCG/ACT inhaler INHALE 1 TO 2 PUFFS INTO THE LUNGS EVERY 6 HOURS AS NEEDED FOR WHEEZING OR SHORTNESS OF BREATH 8.5 g 5  . amoxicillin (AMOXIL) 500 MG tablet Take 2,000 mg by mouth as directed. Take 1 hour prior to dental appointment    . aspirin 81 MG EC tablet Take 81 mg by mouth daily.     . Budeson-Glycopyrrol-Formoterol (BREZTRI AEROSPHERE) 160-9-4.8 MCG/ACT AERO Inhale 2 puffs into the lungs 2 (two) times daily. 10.7 g 11  . budesonide-formoterol (SYMBICORT) 160-4.5 MCG/ACT inhaler Inhale 2 puffs into the lungs 2 (two) times daily. 1 Inhaler 5  . levETIRAcetam (KEPPRA) 750 MG tablet TAKE 1 TABLET BY MOUTH IN THE MORNING AND 2 TABLETS IN THE EVENING (Patient taking differently: Take 750-1,500 mg by mouth See admin  instructions. TAKE 1 TABLET BY MOUTH IN THE MORNING AND 2 TABLETS IN THE EVENING) 270 tablet 3  . losartan (COZAAR) 100 MG tablet TAKE 1 TABLET(100 MG) BY MOUTH DAILY 90 tablet 0  . Multiple Vitamins-Minerals (MULTIVITAMIN ADULTS PO) Take 1 tablet by mouth daily.    Marland Kitchen omeprazole (PRILOSEC) 20 MG capsule Take 20 mg by mouth 2 (two) times daily before a meal.    .  phenytoin (DILANTIN) 100 MG ER capsule TAKE 1 CAPSULE(100 MG) BY MOUTH THREE TIMES DAILY. PLEASE CALL [phone] to schedule f/u for future refills (Patient taking differently: Take 100 mg by mouth 3 (three) times daily. TAKE 1 CAPSULE(100 MG) BY MOUTH THREE TIMES DAILY. PLEASE CALL [phone] to schedule f/u for future refills) 270 capsule 3  . sertraline (ZOLOFT) 50 MG tablet Take 1 tablet (50 mg total) by mouth daily. 90 tablet 1  . Spacer/Aero-Holding Dorise Bullion Use with your symbicort and your rescue inhaler 1 each 0   No current facility-administered medications on file prior to visit.    LABS/IMAGING: No results found for this or any previous visit (from the past 48 hour(s)). No results found.  LIPID PANEL:    Component Value Date/Time   CHOL 166 04/10/2018 0926   TRIG 131 04/10/2018 0926   HDL 50 04/10/2018 0926   CHOLHDL 3.3 04/10/2018 0926   CHOLHDL 4.2 03/13/2017 2338   VLDL 15 03/13/2017 2338   LDLCALC 90 04/10/2018 0926    WEIGHTS: Wt Readings from Last 3 Encounters:  06/18/20 163 lb (73.9 kg)  03/02/20 154 lb (69.9 kg)  02/12/20 157 lb (71.2 kg)    VITALS: BP 118/72   Pulse 77   Ht 5\' 5"  (1.651 m)   Wt 163 lb (73.9 kg)   SpO2 95%   BMI 27.12 kg/m   EXAM: General appearance: alert and no distress Neck: no carotid bruit, no JVD and thyroid not enlarged, symmetric, no tenderness/mass/nodules Lungs: diminished breath sounds bilaterally Heart: regular rate and rhythm Abdomen: soft, non-tender; bowel sounds normal; no masses,  no organomegaly Extremities: extremities normal, atraumatic, no  cyanosis or edema Pulses: 2+ and symmetric Skin: Skin color, texture, turgor normal. No rashes or lesions Neurologic: Grossly normal Psych: Anxious  EKG: Sinus rhythm first-degree AV block at 77, RVH-personally reviewed  ASSESSMENT: 1. Aortic stenosis status post 23 mm [employer] pericardial tissue valve (07/2017) 2. Coronary artery disease status post three-vessel CABG with LIMA to LAD, SVG to diagonal, SVG to RCA (07/2017) 3. Dyspnea-likely secondary to COPD 4. Abnormal EKG suggesting pulmonary disease pattern 5. Recent syncopal episode 6. Seizures 7. Hypercarbic respiratory failure 8. Hypertension  PLAN: 1.   Danny Krause tends to do well after placement of an Edwards pericardial tissue valve in 2018.  According to guidelines he will not need reimaging of the valve unless he is symptomatic until 2028.  He is recently had some dyspnea with productive cough and increased use of his albuterol inhaler.  This is likely his COPD and I encouraged him to reach out to his pulmonologist.  He has not had recent seizures.  Medications have been further adjusted.  Blood pressures well controlled.  He says he has follow-up probably in December with his PCP to reassess his cholesterol.  Follow-up annually or sooner as necessary.  Pixie Casino, MD, [facility], [place] Director of the Advanced Lipid Disorders &  Cardiovascular Risk Reduction Clinic Diplomate of the American Board of Clinical Lipidology Attending Cardiologist  Direct Dial: [phone]  Fax: [phone]  Website:  [email or web address] 06/18/2020, 4:15 PM

## 2020-06-18 NOTE — Patient Instructions (Signed)

## 2020-07-20 ENCOUNTER — Telehealth: Payer: Self-pay | Admitting: Neurology

## 2020-07-20 NOTE — Telephone Encounter (Signed)
I called and spoke to pharmacy and they do have prescription refills available. Pt on 750mg  po am and 1500mg  po pm of KEPPRA.  They will notify that prescription ready on phone alert.

## 2020-07-20 NOTE — Telephone Encounter (Signed)
Pt.s daughter [name] is on [place]. She called for refill for levETIRAcetam (KEPPRA) 750 MG tablet for dad. She states pharmacy informed her that the prescription is closed.  Pharmacy: [facility] [phone]

## 2020-08-19 ENCOUNTER — Ambulatory Visit: Payer: MEDICAID | Admitting: Neurology

## 2020-09-06 ENCOUNTER — Other Ambulatory Visit: Payer: Self-pay | Admitting: Internal Medicine

## 2020-10-28 ENCOUNTER — Ambulatory Visit: Payer: MEDICAID | Admitting: Neurology

## 2020-11-09 ENCOUNTER — Encounter: Payer: Self-pay | Admitting: Neurology

## 2020-11-09 ENCOUNTER — Ambulatory Visit: Payer: PPO | Admitting: Neurology

## 2020-11-09 VITALS — BP 120/77 | HR 80

## 2020-11-09 DIAGNOSIS — R569 Unspecified convulsions: Secondary | ICD-10-CM

## 2020-11-09 DIAGNOSIS — F419 Anxiety disorder, unspecified: Secondary | ICD-10-CM

## 2020-11-09 MED ORDER — SERTRALINE HCL 50 MG PO TABS: 50.0000 mg | ORAL_TABLET | Freq: Every day | ORAL | 1 refills | Status: DC

## 2020-11-09 MED ORDER — PHENYTOIN SODIUM EXTENDED 100 MG PO CAPS: ORAL_CAPSULE | ORAL | 3 refills | Status: DC

## 2020-11-09 MED ORDER — LEVETIRACETAM 750 MG PO TABS: ORAL_TABLET | ORAL | 3 refills | Status: DC

## 2020-11-09 NOTE — Progress Notes (Signed)
PATIENT: Danny Krause DOB: 1940/09/10  REASON FOR VISIT: follow up HISTORY FROM: patient  HISTORY OF PRESENT ILLNESS: Today 11/09/20 Danny Krause is a 80 year old male with history of seizures. At one point he was on four different seizure medications after hospital discharge in May 2019.  At that time had seizures with left-sided weakness and was in status epilepticus.  He has been tapered off Topamax and Vimpat, remains on Dilantin and Keppra.  He lives alone, his son lives next door. His daughter checks on him daily via phone, but hasn't seen him in 3 months until today, has special needs son is careful with COVID.  He is able to manage his own medications.  He has trouble swallowing, has to crush the pills, or break the capsules for Dilantin.  Reports compliance with Keppra and Dilantin.  Last seizure may have been in November, he got anxious during the holiday, both arms got tense, trembling, clenched, "in his own world", afterward was spacey, then came around.  He does not drive.  Denies any falls, uses a cane. He has COPD.  On Zoloft for anxiety. Gets anxious about appointments. Here today with his daughter, [name].   Update 02/12/2020 SS: Danny Krause is a 80 year old male with history of seizures.  At one point he was on four different seizure medications after hospital discharge in May 2019.  At that time had seizures with left-sided weakness and was in status epilepticus.  He was tapered off Vimpat, he was to taper off Topamax, but remains on this, along with Dilantin and Keppra. He is on CPAP for sleep apnea.  Unclear, if he has had actual seizure, describes events that may occur during times of anxiety, his right hand may shake, there is no staring off or loss of consciousness.  He is able to stop it and control it.  He lives by himself, but splits his time living alone and with his daughter and son-in-law.  He does his own daily activities and manages his affairs.  The DMV took his  driver's license due to seizures.  He needs a refill on Zoloft for anxiety. No falls, he has a cane just in case. He presents today for evaluation accompanied by his son-in-law, Abbe Amsterdam.  HISTORY 11/28/2018 Dr. Jannifer Franklin: Danny Krause is a 80 year old right-handed white male with a history of seizures, he was placed on 4 different seizure medications coming out of hospital on 24 Jan 2018.  The patient had seizures with left-sided weakness and was in status epilepticus at that time.  He was treated with Dilantin, Vimpat, Keppra, and Topamax.  The patient was taken off of his Seroquel after he was seen in August 2019, the Vimpat was slowly discontinued and then the Topamax was tapered off.  The patient currently remains on Dilantin and Keppra.  The patient does have an occasional seizure, he has had about 2 seizures since last seen associated with brief right hand shaking unassociated with loss of consciousness.  The patient has gait instability, he uses a cane or a walker to get around, he has not had any falls.  The patient is on CPAP for sleep apnea, he is doing well with this.  He denies any side effects on the medication.  He does not operate a motor vehicle.   REVIEW OF SYSTEMS: Out of a complete 14 system review of symptoms, the patient complains only of the following symptoms, and all other reviewed systems are negative.  Seizure  ALLERGIES: No Known  Allergies  HOME MEDICATIONS: Outpatient Medications Prior to Visit  Medication Sig Dispense Refill  . acetaminophen (TYLENOL) 325 MG tablet Take 325 mg by mouth every 6 (six) hours as needed for mild pain, fever or headache.     . albuterol (VENTOLIN HFA) 108 (90 Base) MCG/ACT inhaler INHALE 1 TO 2 PUFFS INTO THE LUNGS EVERY 6 HOURS AS NEEDED FOR WHEEZING OR SHORTNESS OF BREATH 8.5 g 5  . aspirin 81 MG EC tablet Take 81 mg by mouth daily.     Marland Kitchen atorvastatin (LIPITOR) 40 MG tablet Take 1 tablet (40 mg total) by mouth daily. 90 tablet 3  .  Budeson-Glycopyrrol-Formoterol (BREZTRI AEROSPHERE) 160-9-4.8 MCG/ACT AERO Inhale 2 puffs into the lungs 2 (two) times daily. 10.7 g 11  . hydrALAZINE (APRESOLINE) 10 MG tablet TAKE 1 TABLET(10 MG) BY MOUTH THREE TIMES DAILY 270 tablet 3  . losartan (COZAAR) 100 MG tablet TAKE 1 TABLET(100 MG) BY MOUTH DAILY 90 tablet 0  . Multiple Vitamins-Minerals (MULTIVITAMIN ADULTS PO) Take 1 tablet by mouth daily.    Marland Kitchen Spacer/Aero-Holding Dorise Bullion Use with your symbicort and your rescue inhaler 1 each 0  . levETIRAcetam (KEPPRA) 750 MG tablet TAKE 1 TABLET BY MOUTH IN THE MORNING AND 2 TABLETS IN THE EVENING (Patient taking differently: Take 750-1,500 mg by mouth See admin instructions. TAKE 1 TABLET BY MOUTH IN THE MORNING AND 2 TABLETS IN THE EVENING) 270 tablet 3  . phenytoin (DILANTIN) 100 MG ER capsule TAKE 1 CAPSULE(100 MG) BY MOUTH THREE TIMES DAILY. PLEASE CALL [phone] to schedule f/u for future refills (Patient taking differently: Take 100 mg by mouth 3 (three) times daily. TAKE 1 CAPSULE(100 MG) BY MOUTH THREE TIMES DAILY. PLEASE CALL [phone] to schedule f/u for future refills) 270 capsule 3  . sertraline (ZOLOFT) 50 MG tablet Take 1 tablet (50 mg total) by mouth daily. 90 tablet 1  . amoxicillin (AMOXIL) 500 MG tablet Take 2,000 mg by mouth as directed. Take 1 hour prior to dental appointment    . budesonide-formoterol (SYMBICORT) 160-4.5 MCG/ACT inhaler Inhale 2 puffs into the lungs 2 (two) times daily. 1 Inhaler 5  . omeprazole (PRILOSEC) 20 MG capsule Take 20 mg by mouth 2 (two) times daily before a meal.     No facility-administered medications prior to visit.    PAST MEDICAL HISTORY: Past Medical History:  Diagnosis Date  . Anginal pain ([place])    with exertion  . Coronary artery disease   . Critical aortic valve stenosis   . Dyspnea    with exertion  . 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 Murmur   . Seizure ([place])   . Stroke ([place])   . Syncope     PAST SURGICAL  HISTORY: Past Surgical History:  Procedure Laterality Date  . AORTIC VALVE REPLACEMENT N/A 07/04/2017   Procedure: AORTIC VALVE REPLACEMENT (AVR);  Surgeon: Grace Isaac, MD;  Location: [facility];  Service: Open Heart Surgery;  Laterality: N/A;  . CORONARY ARTERY BYPASS GRAFT N/A 07/04/2017   Procedure: CORONARY ARTERY BYPASS GRAFTING (CABG) x three, using left internal mammary artery and right leg greater saphenous vein harvested endoscopically;  Surgeon: Grace Isaac, MD;  Location: [facility];  Service: Open Heart Surgery;  Laterality: N/A;  . ESOPHAGOGASTRODUODENOSCOPY (EGD) WITH PROPOFOL N/A 03/02/2020   Procedure: ESOPHAGOGASTRODUODENOSCOPY (EGD) WITH PROPOFOL;  Surgeon: Carol Ada, MD;  Location: [facility];  Service: Endoscopy;  Laterality: N/A;  . MALONEY DILATION  03/02/2020   Procedure: MALONEY DILATION;  Surgeon:  Carol Ada, MD;  Location: [facility];  Service: Endoscopy;;  . NO PAST SURGERIES    . RIGHT/LEFT HEART CATH AND CORONARY ANGIOGRAPHY N/A 06/25/2017   Procedure: RIGHT/LEFT HEART CATH AND CORONARY ANGIOGRAPHY;  Surgeon: Troy Sine, MD;  Location: [facility];  Service: Cardiovascular;  Laterality: N/A;  . TEE WITHOUT CARDIOVERSION N/A 07/04/2017   Procedure: TRANSESOPHAGEAL ECHOCARDIOGRAM (TEE);  Surgeon: Grace Isaac, MD;  Location: [facility];  Service: Open Heart Surgery;  Laterality: N/A;  . THORACIC AORTOGRAM  06/25/2017   Procedure: THORACIC AORTOGRAM;  Surgeon: Troy Sine, MD;  Location: [facility];  Service: Cardiovascular;;  aortic root   . TRACHEOSTOMY CLOSURE  02/2018    FAMILY HISTORY: Family History  Problem Relation Age of Onset  . Hernia Father   . Stroke Brother     SOCIAL HISTORY: Social History   Socioeconomic History  . Marital status: Married    Spouse name: [name]  . Number of children: 2  . Years of education: 36  . Highest education level: Not on file  Occupational History  . Occupation: Retired   Tobacco Use  . Smoking status: Former Smoker    Packs/day: 2.00    Years: 64.00    Pack years: 128.00    Types: Cigarettes    Quit date: 07/04/2017    Years since quitting: 3.3  . Smokeless tobacco: Never Used  . Tobacco comment: SINCE I WAS 80 YRS OLD  Vaping Use  . Vaping Use: Never used  Substance and Sexual Activity  . Alcohol use: No  . Drug use: No  . Sexual activity: Not on file  Other Topics Concern  . Not on file  Social History Narrative   Lives with wife   Caffeine use: some   Right handed   Social Determinants of Health   Financial Resource Strain: Not on file  Food Insecurity: Not on file  Transportation Needs: Not on file  Physical Activity: Not on file  Stress: Not on file  Social Connections: Not on file  Intimate Partner Violence: Not on file   PHYSICAL EXAM  Vitals:   11/09/20 1035  BP: 120/77  Pulse: 80   There is no height or weight on file to calculate BMI.  Generalized: Well developed, in no acute distress   Neurological examination  Mentation: Alert oriented to time, place, most history is provided by his daughter, he is hard of hearing, didn't wear hearing aids. Follows all commands speech and language fluent Cranial nerve II-XII: Pupils were equal round reactive to light. Extraocular movements were full, visual field were full on confrontational test. Facial sensation and strength were normal. Head turning and shoulder shrug  were normal and symmetric. Motor: The motor testing reveals 5 over 5 strength of all 4 extremities. Good symmetric motor tone is noted throughout.  Sensory: Sensory testing is intact to soft touch on all 4 extremities. No evidence of extinction is noted.  Coordination: Cerebellar testing reveals good finger-nose-finger and heel-to-shin bilaterally.  Gait and station: Gait is slightly wide-based, good stability with cane. Reflexes: Deep tendon reflexes are symmetric  DIAGNOSTIC DATA (LABS, IMAGING, TESTING) - I  reviewed patient records, labs, notes, testing and imaging myself where available.  Lab Results  Component Value Date   WBC 7.5 02/12/2020   HGB 12.8 (L) 02/12/2020   HCT 38.5 02/12/2020   MCV 88 02/12/2020   PLT 271 02/12/2020      Component Value Date/Time   NA  139 02/12/2020 1159   K 4.8 02/12/2020 1159   CL 100 02/12/2020 1159   CO2 23 02/12/2020 1159   GLUCOSE 97 02/12/2020 1159   GLUCOSE 90 03/29/2018 0352   BUN 19 02/12/2020 1159   CREATININE 1.22 02/12/2020 1159   CALCIUM 9.5 02/12/2020 1159   PROT 6.8 02/12/2020 1159   ALBUMIN 4.0 02/12/2020 1159   AST 21 02/12/2020 1159   ALT 17 02/12/2020 1159   ALKPHOS 183 (H) 02/12/2020 1159   BILITOT <0.2 02/12/2020 1159   GFRNONAA 56 (L) 02/12/2020 1159   GFRAA 65 02/12/2020 1159   Lab Results  Component Value Date   CHOL 166 04/10/2018   HDL 50 04/10/2018   LDLCALC 90 04/10/2018   TRIG 131 04/10/2018   CHOLHDL 3.3 04/10/2018   Lab Results  Component Value Date   HGBA1C 5.9 (H) 02/13/2018   Lab Results  Component Value Date   VITAMINB12 221 03/13/2017   Lab Results  Component Value Date   TSH 2.827 03/28/2018   ASSESSMENT AND PLAN 80 y.o. year old male  has a past medical history of Anginal pain ([place]), Coronary artery disease, Critical aortic valve stenosis, Dyspnea, Encephalopathy chronic (02/13/2018), Murmur, Seizure ([place]), Stroke ([place]), and Syncope. here with:  1.  History of seizures 2.  Anxiety  -Check labs today, Dilantin level was previously low at last visit 5.9 -Probably seizure in November, depending on labs may increase the Dilantin, also try to simply regimen currently taking 100 mg 3 times daily -Continue Keppra 750 mg AM/1500 mg PM, last Keppra level was elevated at 46.3, doesn't report irritability -Refill Zoloft 50 mg daily for anxiety, will see PCP for adjustment -Call for seizure activity, otherwise follow-up 6 months or sooner if needed  I spent 30 minutes of face-to-face and  non-face-to-face time with patient.  This included previsit chart review, lab review, study review, order entry, electronic health record documentation, patient education.  Butler Denmark, AGNP-C, DNP 11/09/2020, 11:35 AM [facility] [address] [place], [place] [address] [phone]

## 2020-11-09 NOTE — Patient Instructions (Signed)
Continue current medications Check labs today  See you back in 6 months Call for any seizures

## 2020-11-09 NOTE — Progress Notes (Signed)
I have read the note, and I agree with the clinical assessment and plan.  Menucha Dicesare K Duwan Adrian   

## 2020-11-10 ENCOUNTER — Encounter: Payer: Self-pay | Admitting: Neurology

## 2020-11-12 LAB — CBC WITH DIFFERENTIAL/PLATELET
Basophils Absolute: 0.1 10*3/uL (ref 0.0–0.2)
Basos: 1 %
EOS (ABSOLUTE): 0.6 10*3/uL — ABNORMAL HIGH (ref 0.0–0.4)
Eos: 7 %
Hematocrit: 40.1 % (ref 37.5–51.0)
Hemoglobin: 13.4 g/dL (ref 13.0–17.7)
Immature Grans (Abs): 0.1 10*3/uL (ref 0.0–0.1)
Immature Granulocytes: 1 %
Lymphocytes Absolute: 1.4 10*3/uL (ref 0.7–3.1)
Lymphs: 18 %
MCH: 29.1 pg (ref 26.6–33.0)
MCHC: 33.4 g/dL (ref 31.5–35.7)
MCV: 87 fL (ref 79–97)
Monocytes Absolute: 0.8 10*3/uL (ref 0.1–0.9)
Monocytes: 10 %
Neutrophils Absolute: 4.9 10*3/uL (ref 1.4–7.0)
Neutrophils: 63 %
Platelets: 271 10*3/uL (ref 150–450)
RBC: 4.6 x10E6/uL (ref 4.14–5.80)
RDW: 12.8 % (ref 11.6–15.4)
WBC: 7.8 10*3/uL (ref 3.4–10.8)

## 2020-11-12 LAB — COMPREHENSIVE METABOLIC PANEL
ALT: 18 IU/L (ref 0–44)
AST: 23 IU/L (ref 0–40)
Albumin/Globulin Ratio: 1.7 (ref 1.2–2.2)
Albumin: 4.3 g/dL (ref 3.7–4.7)
Alkaline Phosphatase: 159 IU/L — ABNORMAL HIGH (ref 44–121)
BUN/Creatinine Ratio: 12 (ref 10–24)
BUN: 15 mg/dL (ref 8–27)
Bilirubin Total: 0.2 mg/dL (ref 0.0–1.2)
CO2: 22 mmol/L (ref 20–29)
Calcium: 9.6 mg/dL (ref 8.6–10.2)
Chloride: 103 mmol/L (ref 96–106)
Creatinine, Ser: 1.24 mg/dL (ref 0.76–1.27)
Globulin, Total: 2.6 g/dL (ref 1.5–4.5)
Glucose: 98 mg/dL (ref 65–99)
Potassium: 5.4 mmol/L — ABNORMAL HIGH (ref 3.5–5.2)
Sodium: 141 mmol/L (ref 134–144)
Total Protein: 6.9 g/dL (ref 6.0–8.5)
eGFR: 59 mL/min/{1.73_m2} — ABNORMAL LOW (ref >59)

## 2020-11-12 LAB — PHENYTOIN LEVEL, TOTAL: Phenytoin (Dilantin), Serum: 7.9 ug/mL — ABNORMAL LOW (ref 10.0–20.0)

## 2020-11-12 LAB — LEVETIRACETAM LEVEL: Levetiracetam Lvl: 41.9 ug/mL — ABNORMAL HIGH (ref 10.0–40.0)

## 2020-11-15 ENCOUNTER — Telehealth: Payer: Self-pay | Admitting: Neurology

## 2020-11-15 NOTE — Telephone Encounter (Signed)
Recently saw the patient, had a seizure back in November.  We talked about simplifying his seizure regimen, is not able to swallow the Dilantin capsules, is taking 100 mg ER 3 times daily, has to use knife to scrape the powder out.  Talked with Dr. Jannifer Franklin, we can simply, if he is agreeable (check with his daughter), I can offer taking the 50 mg chewable tablets, 6 at bedtime (total 300 mg) , add in 50 mg in the morning (to increase dose due to seizure in November). Blood level was 7.9. Doesn't need to be ER preparation, Dilantin itself has long half life.   Recheck level in 3 weeks. I can recheck potassium at that time, unsure why elevated, if not done prior at PCP. Mildly elevated alk phos 159. Keppra level mildly elevated at 41.9.   Lovey Newcomer, let me know his thoughts so I can order labs. Thanks.

## 2020-11-16 NOTE — Telephone Encounter (Signed)
Called and spoke to daughter of pt.  I relayed that due to difficulty swallowing could change the 300mg  to (6-50mg  chew tabs) in PM and take an additional 50mg  chew in AM if he is willing.  Would like to recheck labs in 3 wks .  She will call me back after she speaks with him.

## 2020-11-17 NOTE — Telephone Encounter (Signed)
Daughter to return call back after speaking to pt.

## 2020-12-09 ENCOUNTER — Other Ambulatory Visit: Payer: Self-pay | Admitting: Internal Medicine

## 2021-02-10 ENCOUNTER — Other Ambulatory Visit: Payer: Self-pay | Admitting: Internal Medicine

## 2021-02-24 ENCOUNTER — Telehealth: Payer: Self-pay | Admitting: Pulmonary Disease

## 2021-02-24 MED ORDER — BREZTRI AEROSPHERE 160-9-4.8 MCG/ACT IN AERO: 2.0000 | INHALATION_SPRAY | Freq: Two times a day (BID) | RESPIRATORY_TRACT | 0 refills | Status: DC

## 2021-02-24 NOTE — Telephone Encounter (Signed)
Refill sent into pharmacy. Attempted to call patient.  Nothing further needed at this time.

## 2021-02-25 ENCOUNTER — Other Ambulatory Visit: Payer: Self-pay | Admitting: Internal Medicine

## 2021-03-03 ENCOUNTER — Other Ambulatory Visit: Payer: Self-pay

## 2021-04-11 ENCOUNTER — Other Ambulatory Visit: Payer: Self-pay

## 2021-04-11 ENCOUNTER — Ambulatory Visit: Payer: PPO | Admitting: Pulmonary Disease

## 2021-04-11 ENCOUNTER — Encounter: Payer: Self-pay | Admitting: Pulmonary Disease

## 2021-04-11 VITALS — BP 122/72 | HR 75 | Temp 98.0°F | Ht 65.0 in | Wt 166.0 lb

## 2021-04-11 DIAGNOSIS — G4733 Obstructive sleep apnea (adult) (pediatric): Secondary | ICD-10-CM

## 2021-04-11 DIAGNOSIS — J449 Chronic obstructive pulmonary disease, unspecified: Secondary | ICD-10-CM

## 2021-04-11 MED ORDER — BREZTRI AEROSPHERE 160-9-4.8 MCG/ACT IN AERO: 2.0000 | INHALATION_SPRAY | Freq: Two times a day (BID) | RESPIRATORY_TRACT | 0 refills | Status: DC

## 2021-04-11 MED ORDER — FLUTICASONE PROPIONATE 50 MCG/ACT NA SUSP: 1.0000 | Freq: Every day | NASAL | 2 refills | Status: AC

## 2021-04-11 MED ORDER — GUAIFENESIN ER 600 MG PO TB12: 1200.0000 mg | ORAL_TABLET | Freq: Two times a day (BID) | ORAL | Status: AC | PRN

## 2021-04-11 NOTE — Patient Instructions (Signed)
Flonase 1 spray in each nostril nightly  Can use mucinex twice per day as needed to help with cough and loosen phlegm  Check with your neurologist about whether you can use an antihistamine pill  Follow up in 6 months

## 2021-04-11 NOTE — Progress Notes (Signed)
[facility]  Chief Complaint  Patient presents with   Follow-up    SOB    Constitutional:  BP 122/72 (BP Location: Left Arm, Cuff Size: Normal)   Pulse 75   Temp 98 F (36.7 C) (Oral)   Ht 5\' 5"  (1.651 m)   Wt 166 lb (75.3 kg)   SpO2 98%   BMI 27.62 kg/m   Past Medical History:  CAD, Seizures, CVA  Past Surgical History:  He  has a past surgical history that includes No past surgeries; RIGHT/LEFT HEART CATH AND CORONARY ANGIOGRAPHY (N/A, 06/25/2017); THORACIC AORTOGRAM (06/25/2017); Aortic valve replacement (N/A, 07/04/2017); Coronary artery bypass graft (N/A, 07/04/2017); TEE without cardioversion (N/A, 07/04/2017); Tracheostomy closure (02/2018); Esophagogastroduodenoscopy (egd) with propofol (N/A, 03/02/2020); and maloney dilation (03/02/2020).  Brief Summary:  Danny Krause is a 80 y.o. male former smoker with COPD/emphysema and obstructive sleep apnea.      Subjective:   He is here with his daughter.    He has cough with clear sputum.  Gets winded with activity.  Has home oxygen and uses intermittently.  SpO2 on room air at 89% after walking 3 laps.  Not having chest pain, fever, hemoptysis, or leg swelling.  He is concerned about expense of inhalers and is in the donut hole.  Physical Exam:   Appearance - well kempt   ENMT - no sinus tenderness, no oral exudate, no LAN, Mallampati 3 airway, no stridor  Respiratory - decreased breath sounds bilaterally, no wheezing or rales  CV - s1s2 regular rate and rhythm, no murmurs  Ext - no clubbing, no edema  Skin - no rashes  Psych - normal mood and affect   Pulmonary testing:  PFT 07/03/17 >> FEV1 1.72 (65%), FEV1% 43, TLC 8.09 (125%), DLCO 29%, +BD PFT 03/09/20 >> FEV1 1.30 (51%), FEV1% 54, TLC 9.26 (143%), DLCO 48%  Chest Imaging:  CT chest 03/27/18 >> atherosclerosis, moderate centrilobular emphysema  Sleep Tests:  HST 05/30/18 >> AHI 15.2, SpO2 low 82% Auto  CPAP 03/08/21 to 04/06/21 >> used on 30 of 30 nights with average 9 hrs 16 min.  Average AHI 18 with median CPAP 7 and 95 th percentile CPAP 9 cm H2O.  Air leak.  Cardiac Tests:  Echo 12/19/17 >> EF 55 to 60%, grade 1 DD, mild MR  Social History:  He  reports that he quit smoking about 3 years ago. His smoking use included cigarettes. He has a 128.00 pack-year smoking history. He has never used smokeless tobacco. He reports that he does not drink alcohol and does not use drugs.  Family History:  His family history includes Hernia in his father; Stroke in his brother.     Assessment/Plan:   COPD with emphysema and asthma. - continue breztri; samples given - prn albuterol  Dyspnea on exertion. - likely from COPD and deconditioning - encouraged him to maintain a regular exercise regimen  Chronic hypoxic respiratory failure. - he has home oxygen set up  - goal SpO2 > 90%   Obstructive sleep apnea. - he is compliant with CPAP and reports benefit from therapy - continue auto CPAP 5 to 15 cm H2O - gets supplies through [employer]  CPAP rhinitis. - can use mucinex to help loosen phlegm - flonase and nasal irrigation nightly - he will check with neurology if he can use antihistamine pills with history of seizures  Time Spent Involved in Patient Care on Day of Examination:  33 minutes  Follow up:   Patient Instructions  Flonase 1 spray in each nostril nightly  Can use mucinex twice per day as needed to help with cough and loosen phlegm  Check with your neurologist about whether you can use an antihistamine pill  Follow up in 6 months  Medication List:   Allergies as of 04/11/2021   No Known Allergies      Medication List        Accurate as of April 11, 2021 12:47 PM. If you have any questions, ask your nurse or doctor.          acetaminophen 325 MG tablet Commonly known as: TYLENOL Take 325 mg by mouth every 6 (six) hours as needed for mild pain, fever or  headache.   albuterol 108 (90 Base) MCG/ACT inhaler Commonly known as: VENTOLIN HFA INHALE 1 TO 2 PUFFS INTO THE LUNGS EVERY 6 HOURS AS NEEDED FOR WHEEZING OR SHORTNESS OF BREATH   aspirin 81 MG EC tablet Take 81 mg by mouth daily.   atorvastatin 40 MG tablet Commonly known as: LIPITOR TAKE 1 TABLET(40 MG) BY MOUTH DAILY   Breztri Aerosphere 160-9-4.8 MCG/ACT Aero Generic drug: Budeson-Glycopyrrol-Formoterol Inhale 2 puffs into the lungs 2 (two) times daily. What changed: Another medication with the same name was added. Make sure you understand how and when to take each. Changed by: Chesley Mires, MD   Arnell Sieving [phone] MCG/ACT Aero Generic drug: Budeson-Glycopyrrol-Formoterol Inhale 2 puffs into the lungs 2 (two) times daily. What changed: You were already taking a medication with the same name, and this prescription was added. Make sure you understand how and when to take each. Changed by: Chesley Mires, MD   fluticasone 50 MCG/ACT nasal spray Commonly known as: FLONASE Place 1 spray into both nostrils daily. Started by: Chesley Mires, MD   guaiFENesin 600 MG 12 hr tablet Commonly known as: Mucinex Take 2 tablets (1,200 mg total) by mouth 2 (two) times daily as needed for cough or to loosen phlegm. Started by: Chesley Mires, MD   hydrALAZINE 10 MG tablet Commonly known as: APRESOLINE TAKE 1 TABLET(10 MG) BY MOUTH THREE TIMES DAILY   levETIRAcetam 750 MG tablet Commonly known as: KEPPRA TAKE 1 TABLET BY MOUTH IN THE MORNING AND 2 TABLETS IN THE EVENING   losartan 100 MG tablet Commonly known as: COZAAR TAKE 1 TABLET(100 MG) BY MOUTH DAILY   MULTIVITAMIN ADULTS PO Take 1 tablet by mouth daily.   phenytoin 100 MG ER capsule Commonly known as: DILANTIN TAKE 1 CAPSULE(100 MG) BY MOUTH THREE TIMES DAILY.   sertraline 50 MG tablet Commonly known as: ZOLOFT Take 1 tablet (50 mg total) by mouth daily.   Spacer/Aero-Holding Dorise Bullion Use with your symbicort  and your rescue inhaler        Signature:  Chesley Mires, MD [place] Pager - [phone] 04/11/2021, 12:47 PM

## 2021-04-21 ENCOUNTER — Telehealth: Payer: Self-pay | Admitting: Neurology

## 2021-04-21 MED ORDER — LEVETIRACETAM 750 MG PO TABS: ORAL_TABLET | ORAL | 3 refills | Status: DC

## 2021-04-21 NOTE — Telephone Encounter (Signed)
Refills sent to updated pharmacy. Pt's dgt aware.

## 2021-04-21 NOTE — Telephone Encounter (Signed)
Pt daughter called requesting refill for her father's levETIRAcetam (KEPPRA) 750 MG tablet. [place] [phone].

## 2021-05-02 ENCOUNTER — Telehealth: Payer: Self-pay | Admitting: Pulmonary Disease

## 2021-05-02 MED ORDER — BREZTRI AEROSPHERE 160-9-4.8 MCG/ACT IN AERO: 2.0000 | INHALATION_SPRAY | Freq: Two times a day (BID) | RESPIRATORY_TRACT | 6 refills | Status: DC

## 2021-05-02 NOTE — Telephone Encounter (Signed)
I have LM on VM for Danny Krause to make him aware of refill that has been sent to the pharmacy.  Nothing further is needed.

## 2021-05-17 ENCOUNTER — Ambulatory Visit: Payer: PPO | Admitting: Neurology

## 2021-05-17 ENCOUNTER — Encounter: Payer: Self-pay | Admitting: Neurology

## 2021-05-17 VITALS — BP 118/76 | HR 80 | Ht 65.0 in | Wt 166.0 lb

## 2021-05-17 DIAGNOSIS — R569 Unspecified convulsions: Secondary | ICD-10-CM | POA: Diagnosis not present

## 2021-05-17 MED ORDER — ZONISAMIDE 50 MG PO CAPS: ORAL_CAPSULE | ORAL | 1 refills | Status: DC

## 2021-05-17 NOTE — Progress Notes (Signed)
Reason for visit: Seizures  Danny Krause is an 80 y.o. male  History of present illness:  Danny Krause is an 80 year old right-handed white male with a history of a seizure disorder.  He has sleep apnea on CPAP as well.  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  His family checks on him regularly.  He reports that he does not feel well on [name].  When he takes it in the evening he feels nervous and jittery and he has difficulty sleeping.  He has some generalized anxiety problems over and above this.  He may have bad dreams at night.  He reports no recent seizure type episodes since last seen.  The patient returns this office for further evaluation.  The patient is also remaining on Dilantin, his most recent blood levels in March 2022 were 7.9.  Past Medical History:  Diagnosis Date   Anginal pain ([place])    with exertion   Coronary artery disease    Critical aortic valve stenosis    Dyspnea    with exertion   Encephalopathy chronic 02/13/2018   Murmur    Seizure ([place])    Stroke [facility])    Syncope     Past Surgical History:  Procedure Laterality Date   AORTIC VALVE REPLACEMENT N/A 07/04/2017   Procedure: AORTIC VALVE REPLACEMENT (AVR);  Surgeon: Grace Isaac, MD;  Location: [place];  Service: Open Heart Surgery;  Laterality: N/A;   CORONARY ARTERY BYPASS GRAFT N/A 07/04/2017   Procedure: CORONARY ARTERY BYPASS GRAFTING (CABG) x three, using left internal mammary artery and right leg greater saphenous vein harvested endoscopically;  Surgeon: Grace Isaac, MD;  Location: [place];  Service: Open Heart Surgery;  Laterality: N/A;   ESOPHAGOGASTRODUODENOSCOPY (EGD) WITH PROPOFOL N/A 03/02/2020   Procedure: ESOPHAGOGASTRODUODENOSCOPY (EGD) WITH PROPOFOL;  Surgeon: Carol Ada, MD;  Location: [facility] ENDOSCOPY;  Service: Endoscopy;  Laterality: N/A;   MALONEY DILATION  03/02/2020   Procedure: Venia Minks DILATION;  Surgeon: Carol Ada, MD;  Location: [facility]  ENDOSCOPY;  Service: Endoscopy;;   NO PAST SURGERIES     RIGHT/LEFT HEART CATH AND CORONARY ANGIOGRAPHY N/A 06/25/2017   Procedure: RIGHT/LEFT HEART CATH AND CORONARY ANGIOGRAPHY;  Surgeon: Troy Sine, MD;  Location: [place] CV LAB;  Service: Cardiovascular;  Laterality: N/A;   TEE WITHOUT CARDIOVERSION N/A 07/04/2017   Procedure: TRANSESOPHAGEAL ECHOCARDIOGRAM (TEE);  Surgeon: Grace Isaac, MD;  Location: [place];  Service: Open Heart Surgery;  Laterality: N/A;   THORACIC AORTOGRAM  06/25/2017   Procedure: THORACIC AORTOGRAM;  Surgeon: Troy Sine, MD;  Location: [place] CV LAB;  Service: Cardiovascular;;  aortic root    TRACHEOSTOMY CLOSURE  02/2018    Family History  Problem Relation Age of Onset   Hernia Father    Stroke Brother     Social history:  reports that he quit smoking about 3 years ago. His smoking use included cigarettes. He has a 128.00 pack-year smoking history. He has never used smokeless tobacco. He reports that he does not drink alcohol and does not use drugs.   No Known Allergies  Medications:  Prior to Admission medications   Medication Sig Start Date End Date Taking? Authorizing Provider  acetaminophen (TYLENOL) 325 MG tablet Take 325 mg by mouth every 6 (six) hours as needed for mild pain, fever or headache.    Yes [provider]  albuterol (VENTOLIN HFA) 108 (90 Base) MCG/ACT inhaler INHALE 1  TO 2 PUFFS INTO THE LUNGS EVERY 6 HOURS AS NEEDED FOR WHEEZING OR SHORTNESS OF BREATH 04/28/20  Yes Lauraine Rinne, NP  aspirin 81 MG EC tablet Take 81 mg by mouth daily.    Yes [provider]  atorvastatin (LIPITOR) 40 MG tablet TAKE 1 TABLET(40 MG) BY MOUTH DAILY 02/25/21  Yes Hilty, Nadean Corwin, MD  Budeson-Glycopyrrol-Formoterol (BREZTRI AEROSPHERE) 160-9-4.8 MCG/ACT AERO Inhale 2 puffs into the lungs 2 (two) times daily. 05/02/21  Yes Chesley Mires, MD  fluticasone (FLONASE) 50 MCG/ACT nasal spray Place 1 spray into both nostrils daily.  04/11/21  Yes Chesley Mires, MD  hydrALAZINE (APRESOLINE) 10 MG tablet TAKE 1 TABLET(10 MG) BY MOUTH THREE TIMES DAILY 06/18/20  Yes Hilty, Nadean Corwin, MD  levETIRAcetam (KEPPRA) 750 MG tablet TAKE 1 TABLET BY MOUTH IN THE MORNING AND 2 TABLETS IN THE EVENING 04/21/21  Yes Suzzanne Cloud, NP  losartan (COZAAR) 100 MG tablet TAKE 1 TABLET(100 MG) BY MOUTH DAILY 02/10/21  Yes Hilty, Nadean Corwin, MD  Multiple Vitamins-Minerals (MULTIVITAMIN ADULTS PO) Take 1 tablet by mouth daily.   Yes [provider]  phenytoin (DILANTIN) 100 MG ER capsule TAKE 1 CAPSULE(100 MG) BY MOUTH THREE TIMES DAILY. 11/09/20  Yes Suzzanne Cloud, NP  sertraline (ZOLOFT) 50 MG tablet Take 1 tablet (50 mg total) by mouth daily. 11/09/20  Yes Suzzanne Cloud, NP  Spacer/Aero-Holding Dorise Bullion Use with your symbicort and your rescue inhaler 07/08/18  Yes Lauraine Rinne, NP  guaiFENesin (MUCINEX) 600 MG 12 hr tablet Take 2 tablets (1,200 mg total) by mouth 2 (two) times daily as needed for cough or to loosen phlegm. Patient not taking: Reported on 05/17/2021 04/11/21   Chesley Mires, MD    ROS:  Out of a complete 14 system review of symptoms, the patient complains only of the following symptoms, and all other reviewed systems are negative.  Anxiety Seizures Walking difficulty  Blood pressure 118/76, pulse 80, height 5\' 5"  (1.651 m), weight 166 lb (75.3 kg).  Physical Exam  General: The patient is alert and cooperative at the time of the examination.  The patient is moderately obese.  Skin: No significant peripheral edema is noted.   Neurologic Exam  Mental status: The patient is alert and oriented x 3 at the time of the examination. The patient has apparent normal recent and remote memory, with an apparently normal attention span and concentration ability.   Cranial nerves: Facial symmetry is present. Speech is normal, no aphasia or dysarthria is noted. Extraocular movements are full. Visual fields are full.  Motor: The  patient has good strength in all 4 extremities.  Sensory examination: Soft touch sensation is symmetric on the face, arms, and legs.  Coordination: The patient has good finger-nose-finger and heel-to-shin bilaterally.  Gait and station: The patient has a slightly wide-based gait, usually uses a cane for ambulation.  Tandem gait was not attempted.  Romberg is negative.  Reflexes: Deep tendon reflexes are symmetric.   Assessment/Plan:  1.  History of seizures  The patient reports problems with anxiety on the Keppra.  We will switch over to Zonegran.  He will start 50 mg twice daily for 2 weeks and then go to 100 mg twice daily.  They are to contact our office in 4 weeks, I will initiate a taper off of the [place] at that point.  The patient will follow-up in 6 months, in the future he can be followed through Dr. April Manson.  C.  Floyde Parkins MD 05/17/2021 10:39 AM  [facility] [address] [place], [place] [address]  Phone [phone] Fax [phone]

## 2021-05-17 NOTE — Patient Instructions (Signed)
We will start Zonegran for the seizures, call in 4 weeks to initiate the Keppra taper.

## 2021-05-18 ENCOUNTER — Other Ambulatory Visit: Payer: Self-pay | Admitting: Internal Medicine

## 2021-05-18 ENCOUNTER — Telehealth: Payer: Self-pay | Admitting: Neurology

## 2021-05-18 MED ORDER — SERTRALINE HCL 50 MG PO TABS: 50.0000 mg | ORAL_TABLET | Freq: Every day | ORAL | 3 refills | Status: AC

## 2021-05-18 NOTE — Telephone Encounter (Signed)
I called and left a message, okay for the patient continue Zoloft as long as he is getting benefit from this.  I will send in a prescription.

## 2021-05-18 NOTE — Telephone Encounter (Signed)
Pt's daughter [name] called stating she forgot to ask if Dr. Jannifer Franklin is still wanting her father to take the sertraline (ZOLOFT) 50 MG tablet. If so he is needing a refill, pharmacy [facility] [phone].

## 2021-05-20 ENCOUNTER — Other Ambulatory Visit: Payer: Self-pay | Admitting: Internal Medicine

## 2021-06-17 ENCOUNTER — Other Ambulatory Visit: Payer: Self-pay | Admitting: Neurology

## 2021-06-17 ENCOUNTER — Telehealth: Payer: Self-pay | Admitting: Neurology

## 2021-06-17 MED ORDER — LEVETIRACETAM 250 MG PO TABS
ORAL_TABLET | ORAL | 0 refills | Status: AC
Start: 2021-06-17 — End: ?

## 2021-06-17 MED ORDER — ZONISAMIDE 100 MG PO CAPS: 100.0000 mg | ORAL_CAPSULE | Freq: Two times a day (BID) | ORAL | 1 refills | Status: DC

## 2021-06-17 NOTE — Telephone Encounter (Signed)
I called the daughter.  I will send in the 250 mg Keppra tablets so that they can taper off of the medication.  I will send a prescription for the Zonegran 100 mg capsules to take 1 twice daily.  The patient will take 500 mg twice daily of Keppra for 3 weeks, then go to 250 mg twice daily for 3 weeks, then stop.

## 2021-06-17 NOTE — Telephone Encounter (Signed)
Pt's daughter called needing to speak to the provider regarding the weaning schedule for the pt's levETIRAcetam (KEPPRA) 750 MG tablet Please advise.

## 2021-07-11 ENCOUNTER — Telehealth: Payer: Self-pay | Admitting: Neurology

## 2021-07-11 NOTE — Telephone Encounter (Signed)
Late entry: I received a phone message from the patient's daughter through the after-hours call service on 07/10/2021 at 2:34 PM.  I was able to connect with the daughter quickly via phone number provided.  She reported that patient was in the process of tapering Keppra and starting a new seizure medication.  He was having trouble sleeping.  She was wondering if there is anything he can take safely without concern for interaction or side effect.  I advised her to start patient on melatonin.  She reported that he had taken it without much success in the past at 1 mg strength.  She was advised to start with 2 mg and gradually increase if needed to up to 5 mg an hour or 2 before projected bedtime.  She also asked about chamomile tea.  She was encouraged to try it, it is unlikely to cause any side effects or interaction with medications.  She demonstrated understanding and agreement with the plan.  She was not aware that Dr. Jannifer Franklin had retired and I advised her that patient will likely be transferred to another specialist in our practice.  Upon chart review from the visit from September 2022, Dr. Jannifer Franklin had suggested that patient follow-up with Dr. April Manson.  Copying Dr. April Manson and Judson Roch on this note.

## 2021-07-15 ENCOUNTER — Telehealth: Payer: Self-pay | Admitting: Pulmonary Disease

## 2021-07-15 MED ORDER — PREDNISONE 10 MG PO TABS: ORAL_TABLET | ORAL | 0 refills | Status: AC

## 2021-07-15 MED ORDER — AZITHROMYCIN 250 MG PO TABS: ORAL_TABLET | ORAL | 0 refills | Status: DC

## 2021-07-15 NOTE — Telephone Encounter (Signed)
Please send script for zpak.  Please send script for prednisone 10 mg >> 3 pills daily for 2 days, 2 pills daily for 2 days, 1 pill daily for 2 days.  He should get tested for COVID and Influenza.  He needs ROV if symptoms get worse or if he tests positive for COVID or influenza.

## 2021-07-15 NOTE — Telephone Encounter (Signed)
Called and spoke with daughter to let her know the recs from Dr. Halford Chessman. She expressed understanding and verified preferred pharmacy. She said she had a home covid test she could do and would take him somewhere to get flu tested. Advised her if they are positive to call us back and let us know. And if he is not feeling better after completing prescriptions to call and let us know. She expressed understanding. Nothing further needed at this time.

## 2021-07-15 NOTE — Telephone Encounter (Signed)
Call returned to patient daughter, confirmed patient DOB (emergency contact). Daughter states he developed a cough with yellow mucous on sunday. Daughter reports his cough is increased more than his normal COPD cough. He is using his breztri daily and albuterol prn. He took his mucinex and started coughing more on mucinex so he stopped using it. He uses his nasal spray daily. Denies fever, sweats, chills, or body aches. He has not taken anything OTC. He has been using his oxygen more this week due to feeling SOB however his oxygen saturations have not desaturated. He has not been around anyone sick. Requires family to use a mask when they are around him.   VS please advise. Thanks :)

## 2021-10-31 ENCOUNTER — Other Ambulatory Visit: Payer: Self-pay

## 2021-10-31 MED ORDER — PHENYTOIN SODIUM EXTENDED 100 MG PO CAPS: ORAL_CAPSULE | ORAL | 3 refills | Status: AC

## 2021-11-15 ENCOUNTER — Ambulatory Visit: Payer: PPO | Admitting: Neurology

## 2022-01-12 ENCOUNTER — Other Ambulatory Visit: Payer: Self-pay | Admitting: Internal Medicine

## 2022-01-17 ENCOUNTER — Telehealth: Payer: Self-pay | Admitting: Pulmonary Disease

## 2022-01-17 MED ORDER — BREZTRI AEROSPHERE 160-9-4.8 MCG/ACT IN AERO: 2.0000 | INHALATION_SPRAY | Freq: Two times a day (BID) | RESPIRATORY_TRACT | 11 refills | Status: AC

## 2022-01-17 NOTE — Telephone Encounter (Signed)
Called and spoke with Danny Krause who states that patient need refill on his Breztri inhaler. She verified preferred pharmacy. RX has been sent and patient is scheduled for OV in July. Nothing further needed at this time. ?

## 2022-01-23 ENCOUNTER — Telehealth: Payer: Self-pay | Admitting: Neurology

## 2022-01-23 MED ORDER — ZONISAMIDE 100 MG PO CAPS: 100.0000 mg | ORAL_CAPSULE | Freq: Two times a day (BID) | ORAL | 1 refills | Status: AC

## 2022-01-23 NOTE — Telephone Encounter (Signed)
Zonegran refill sent to [facility].

## 2022-01-23 NOTE — Telephone Encounter (Signed)
Pt is requesting a refill for zonisamide (ZONEGRAN) 100 MG capsule.  Pharmacy:  [place] [facility] [phone]

## 2022-02-26 ENCOUNTER — Other Ambulatory Visit: Payer: Self-pay | Admitting: Internal Medicine

## 2022-02-27 ENCOUNTER — Encounter: Payer: Self-pay | Admitting: Physician Assistant

## 2022-02-27 ENCOUNTER — Ambulatory Visit: Payer: PPO | Admitting: Nurse Practitioner

## 2022-02-27 VITALS — BP 112/68 | HR 84 | Ht 65.0 in | Wt 125.0 lb

## 2022-02-27 DIAGNOSIS — Z952 Presence of prosthetic heart valve: Secondary | ICD-10-CM

## 2022-02-27 DIAGNOSIS — Z79899 Other long term (current) drug therapy: Secondary | ICD-10-CM

## 2022-02-27 DIAGNOSIS — E785 Hyperlipidemia, unspecified: Secondary | ICD-10-CM

## 2022-02-27 DIAGNOSIS — Z951 Presence of aortocoronary bypass graft: Secondary | ICD-10-CM

## 2022-02-27 DIAGNOSIS — J449 Chronic obstructive pulmonary disease, unspecified: Secondary | ICD-10-CM

## 2022-02-27 DIAGNOSIS — I10 Essential (primary) hypertension: Secondary | ICD-10-CM | POA: Diagnosis not present

## 2022-02-27 DIAGNOSIS — R569 Unspecified convulsions: Secondary | ICD-10-CM

## 2022-02-28 ENCOUNTER — Other Ambulatory Visit: Payer: Self-pay | Admitting: Internal Medicine

## 2022-02-28 LAB — COMPREHENSIVE METABOLIC PANEL
ALT: 25 IU/L (ref 0–44)
AST: 34 IU/L (ref 0–40)
Albumin/Globulin Ratio: 1.5 (ref 1.2–2.2)
Albumin: 3.7 g/dL (ref 3.6–4.6)
Alkaline Phosphatase: 195 IU/L — ABNORMAL HIGH (ref 44–121)
BUN/Creatinine Ratio: 17 (ref 10–24)
BUN: 19 mg/dL (ref 8–27)
Bilirubin Total: 0.3 mg/dL (ref 0.0–1.2)
CO2: 22 mmol/L (ref 20–29)
Calcium: 10.7 mg/dL — ABNORMAL HIGH (ref 8.6–10.2)
Chloride: 101 mmol/L (ref 96–106)
Creatinine, Ser: 1.09 mg/dL (ref 0.76–1.27)
Globulin, Total: 2.4 g/dL (ref 1.5–4.5)
Glucose: 113 mg/dL — ABNORMAL HIGH (ref 70–99)
Potassium: 4.3 mmol/L (ref 3.5–5.2)
Sodium: 139 mmol/L (ref 134–144)
Total Protein: 6.1 g/dL (ref 6.0–8.5)
eGFR: 68 mL/min/{1.73_m2} (ref >59)

## 2022-02-28 LAB — CBC
Hematocrit: 35.2 % — ABNORMAL LOW (ref 37.5–51.0)
Hemoglobin: 12 g/dL — ABNORMAL LOW (ref 13.0–17.7)
MCH: 29.1 pg (ref 26.6–33.0)
MCHC: 34.1 g/dL (ref 31.5–35.7)
MCV: 85 fL (ref 79–97)
Platelets: 329 10*3/uL (ref 150–450)
RBC: 4.12 x10E6/uL — ABNORMAL LOW (ref 4.14–5.80)
RDW: 12.9 % (ref 11.6–15.4)
WBC: 11.3 10*3/uL — ABNORMAL HIGH (ref 3.4–10.8)

## 2022-03-08 ENCOUNTER — Encounter: Payer: Self-pay | Admitting: Pulmonary Disease

## 2022-03-08 ENCOUNTER — Ambulatory Visit: Payer: PPO | Admitting: Pulmonary Disease

## 2022-03-08 VITALS — BP 118/72 | HR 85 | Temp 98.2°F | Ht 65.0 in | Wt 125.0 lb

## 2022-03-08 DIAGNOSIS — J449 Chronic obstructive pulmonary disease, unspecified: Secondary | ICD-10-CM

## 2022-03-08 DIAGNOSIS — J9611 Chronic respiratory failure with hypoxia: Secondary | ICD-10-CM | POA: Diagnosis not present

## 2022-03-08 NOTE — Progress Notes (Signed)
[facility]  Chief Complaint  Patient presents with   Follow-up    He reports that is doing well and he has some pain in his back, and pain in the back gives him shortness of breath at times.    Constitutional:  BP 118/72 (BP Location: Left Arm, Cuff Size: Normal)   Pulse 85   Temp 98.2 F (36.8 C) (Oral)   Ht 5\' 5"  (1.651 m)   Wt 125 lb (56.7 kg)   SpO2 99%   BMI 20.80 kg/m   Past Medical History:  CAD, Seizures, CVA  Past Surgical History:  He  has a past surgical history that includes No past surgeries; RIGHT/LEFT HEART CATH AND CORONARY ANGIOGRAPHY (N/A, 06/25/2017); THORACIC AORTOGRAM (06/25/2017); Aortic valve replacement (N/A, 07/04/2017); Coronary artery bypass graft (N/A, 07/04/2017); TEE without cardioversion (N/A, 07/04/2017); Tracheostomy closure (02/2018); Esophagogastroduodenoscopy (egd) with propofol (N/A, 03/02/2020); and maloney dilation (03/02/2020).  Brief Summary:  Danny Krause is a 81 y.o. male former smoker with COPD/emphysema and obstructive sleep apnea.      Subjective:   He is here with his daughter.    He had nose bleed few weeks ago and coughed up blood.  None since.  Usually phlegm is clear.  Not having wheeze.  Doesn't like using CPAP at night.  Has trouble with back pain.  He doesn't want to have surgery.  Physical Exam:   Appearance - well kempt, sitting in wheelchair, poor hearing  ENMT - no sinus tenderness, no oral exudate, no LAN, Mallampati 3 airway, no stridor  Respiratory - equal breath sounds bilaterally, no wheezing or rales  CV - s1s2 regular rate and rhythm, no murmurs  Ext - no clubbing, no edema  Skin - no rashes  Psych - normal mood and affect    Pulmonary testing:  PFT 07/03/17 >> FEV1 1.72 (65%), FEV1% 43, TLC 8.09 (125%), DLCO 29%, +BD PFT 03/09/20 >> FEV1 1.30 (51%), FEV1% 54, TLC 9.26 (143%), DLCO 48%  Chest Imaging:  CT chest 03/27/18 >> atherosclerosis, moderate  centrilobular emphysema  Sleep Tests:  HST 05/30/18 >> AHI 15.2, SpO2 low 82% Auto CPAP 03/08/21 to 04/06/21 >> used on 30 of 30 nights with average 9 hrs 16 min.  Average AHI 18 with median CPAP 7 and 95 th percentile CPAP 9 cm H2O.  Air leak.  Cardiac Tests:  Echo 12/19/17 >> EF 55 to 60%, grade 1 DD, mild MR  Social History:  He  reports that he quit smoking about 4 years ago. His smoking use included cigarettes. He has a 128.00 pack-year smoking history. He has never used smokeless tobacco. He reports that he does not drink alcohol and does not use drugs.  Family History:  His family history includes Hernia in his father; Stroke in his brother.     Assessment/Plan:   COPD with emphysema and asthma. - continue breztri - prn albuterol  Chronic hypoxic respiratory failure. - use 2 liters oxygen at night and as needed during the day - goal SpO2 > 90%   Obstructive sleep apnea. - he is intolerant of CPAP and no longer is using CPAP  Time Spent Involved in Patient Care on Day of Examination:  36 minutes  Follow up:   Patient Instructions  Use 2 liters oxygen at night  Follow up in 6 months  Medication List:   Allergies as of 03/08/2022   No Known Allergies      Medication List  Accurate as of March 08, 2022  3:56 PM. If you have any questions, ask your nurse or doctor.          STOP taking these medications    azithromycin 250 MG tablet Commonly known as: ZITHROMAX Stopped by: Chesley Mires, MD       TAKE these medications    acetaminophen 325 MG tablet Commonly known as: TYLENOL Take 325 mg by mouth every 6 (six) hours as needed for mild pain, fever or headache.   albuterol 108 (90 Base) MCG/ACT inhaler Commonly known as: VENTOLIN HFA INHALE 1 TO 2 PUFFS INTO THE LUNGS EVERY 6 HOURS AS NEEDED FOR WHEEZING OR SHORTNESS OF BREATH   aspirin EC 81 MG tablet Take 81 mg by mouth daily.   atorvastatin 40 MG tablet Commonly known as: LIPITOR TAKE 1  TABLET BY MOUTH EVERY DAY   Breztri Aerosphere 160-9-4.8 MCG/ACT Aero Generic drug: Budeson-Glycopyrrol-Formoterol Inhale 2 puffs into the lungs 2 (two) times daily.   fluticasone 50 MCG/ACT nasal spray Commonly known as: FLONASE Place 1 spray into both nostrils daily.   guaiFENesin 600 MG 12 hr tablet Commonly known as: Mucinex Take 2 tablets (1,200 mg total) by mouth 2 (two) times daily as needed for cough or to loosen phlegm.   hydrALAZINE 10 MG tablet Commonly known as: APRESOLINE TAKE 1 TABLET(10 MG) BY MOUTH THREE TIMES DAILY   levETIRAcetam 250 MG tablet Commonly known as: Keppra 2 tablets twice daily for 3 weeks, then take 1 tablet twice daily for 3 weeks, then stop   losartan 100 MG tablet Commonly known as: COZAAR TAKE 1 TABLET(100 MG) BY MOUTH DAILY   MULTIVITAMIN ADULTS PO Take 1 tablet by mouth daily.   phenytoin 100 MG ER capsule Commonly known as: DILANTIN TAKE 1 CAPSULE(100 MG) BY MOUTH THREE TIMES DAILY.   sertraline 50 MG tablet Commonly known as: ZOLOFT Take 1 tablet (50 mg total) by mouth daily.   Spacer/Aero-Holding [employer] Use with your symbicort and your rescue inhaler   zonisamide 100 MG capsule Commonly known as: Zonegran Take 1 capsule (100 mg total) by mouth 2 (two) times daily.        Signature:  Chesley Mires, MD [place] Pager - [phone] 03/08/2022, 3:56 PM

## 2022-03-08 NOTE — Patient Instructions (Signed)
Use 2 liters oxygen at night  Follow up in 6 months

## 2022-03-13 ENCOUNTER — Ambulatory Visit (HOSPITAL_COMMUNITY): Payer: PPO | Attending: Cardiology

## 2022-03-13 DIAGNOSIS — Z952 Presence of prosthetic heart valve: Secondary | ICD-10-CM | POA: Insufficient documentation

## 2022-03-15 LAB — ECHOCARDIOGRAM COMPLETE
AR max vel: 1.48 cm2
AV Area VTI: 1.86 cm2
AV Area mean vel: 1.73 cm2
AV Mean grad: 8.5 mmHg
AV Peak grad: 15 mmHg
Ao pk vel: 1.94 m/s
Area-P 1/2: 2.71 cm2
S' Lateral: 3.1 cm

## 2022-03-20 ENCOUNTER — Other Ambulatory Visit: Payer: Self-pay | Admitting: Orthopedic Surgery

## 2022-03-20 DIAGNOSIS — M5126 Other intervertebral disc displacement, lumbar region: Secondary | ICD-10-CM

## 2022-03-26 ENCOUNTER — Other Ambulatory Visit: Payer: Self-pay | Admitting: Orthopedic Surgery

## 2022-03-26 ENCOUNTER — Ambulatory Visit
Admission: RE | Admit: 2022-03-26 | Discharge: 2022-03-26 | Disposition: A | Discharge status: Home / Self Care | Payer: PPO | Source: Ambulatory Visit | Attending: Orthopedic Surgery | Admitting: Orthopedic Surgery

## 2022-03-26 DIAGNOSIS — M5126 Other intervertebral disc displacement, lumbar region: Secondary | ICD-10-CM

## 2022-03-26 MED ORDER — GADOBUTROL 1 MMOL/ML IV SOLN
5.0000 mL | Freq: Once | INTRAVENOUS | Status: AC | PRN
  Administered 2022-03-26: 5 mL via INTRAVENOUS

## 2022-03-27 ENCOUNTER — Other Ambulatory Visit: Payer: Self-pay | Admitting: Neurosurgery

## 2022-03-27 ENCOUNTER — Other Ambulatory Visit (HOSPITAL_COMMUNITY): Payer: Self-pay | Admitting: Neurosurgery

## 2022-03-27 DIAGNOSIS — D492 Neoplasm of unspecified behavior of bone, soft tissue, and skin: Secondary | ICD-10-CM

## 2022-03-29 ENCOUNTER — Other Ambulatory Visit (HOSPITAL_COMMUNITY): Payer: Self-pay | Admitting: Neuroradiology

## 2022-03-29 DIAGNOSIS — D492 Neoplasm of unspecified behavior of bone, soft tissue, and skin: Secondary | ICD-10-CM

## 2022-03-30 ENCOUNTER — Emergency Department (HOSPITAL_COMMUNITY): Payer: PPO

## 2022-03-30 ENCOUNTER — Telehealth (HOSPITAL_COMMUNITY): Payer: Self-pay

## 2022-03-30 ENCOUNTER — Inpatient Hospital Stay (HOSPITAL_COMMUNITY)
Admission: EM | Admit: 2022-03-30 | Discharge: 2022-04-04 | DRG: 180 | Disposition: E | Payer: PPO | Attending: Internal Medicine | Admitting: Internal Medicine

## 2022-03-30 ENCOUNTER — Other Ambulatory Visit: Payer: Self-pay

## 2022-03-30 ENCOUNTER — Encounter (HOSPITAL_COMMUNITY): Payer: Self-pay

## 2022-03-30 ENCOUNTER — Inpatient Hospital Stay (HOSPITAL_COMMUNITY): Payer: PPO

## 2022-03-30 DIAGNOSIS — I2510 Atherosclerotic heart disease of native coronary artery without angina pectoris: Secondary | ICD-10-CM | POA: Diagnosis present

## 2022-03-30 DIAGNOSIS — M48061 Spinal stenosis, lumbar region without neurogenic claudication: Secondary | ICD-10-CM | POA: Diagnosis present

## 2022-03-30 DIAGNOSIS — C3412 Malignant neoplasm of upper lobe, left bronchus or lung: Principal | ICD-10-CM | POA: Diagnosis present

## 2022-03-30 DIAGNOSIS — Z951 Presence of aortocoronary bypass graft: Secondary | ICD-10-CM

## 2022-03-30 DIAGNOSIS — C7951 Secondary malignant neoplasm of bone: Secondary | ICD-10-CM | POA: Diagnosis present

## 2022-03-30 DIAGNOSIS — J9621 Acute and chronic respiratory failure with hypoxia: Secondary | ICD-10-CM | POA: Diagnosis present

## 2022-03-30 DIAGNOSIS — G9349 Other encephalopathy: Secondary | ICD-10-CM | POA: Diagnosis present

## 2022-03-30 DIAGNOSIS — C7931 Secondary malignant neoplasm of brain: Secondary | ICD-10-CM | POA: Diagnosis present

## 2022-03-30 DIAGNOSIS — E785 Hyperlipidemia, unspecified: Secondary | ICD-10-CM | POA: Diagnosis present

## 2022-03-30 DIAGNOSIS — I10 Essential (primary) hypertension: Secondary | ICD-10-CM | POA: Diagnosis present

## 2022-03-30 DIAGNOSIS — Z823 Family history of stroke: Secondary | ICD-10-CM

## 2022-03-30 DIAGNOSIS — I619 Nontraumatic intracerebral hemorrhage, unspecified: Secondary | ICD-10-CM | POA: Diagnosis not present

## 2022-03-30 DIAGNOSIS — Z7982 Long term (current) use of aspirin: Secondary | ICD-10-CM

## 2022-03-30 DIAGNOSIS — R918 Other nonspecific abnormal finding of lung field: Secondary | ICD-10-CM | POA: Diagnosis not present

## 2022-03-30 DIAGNOSIS — I6389 Other cerebral infarction: Secondary | ICD-10-CM | POA: Diagnosis present

## 2022-03-30 DIAGNOSIS — Z9981 Dependence on supplemental oxygen: Secondary | ICD-10-CM

## 2022-03-30 DIAGNOSIS — Z66 Do not resuscitate: Secondary | ICD-10-CM | POA: Diagnosis present

## 2022-03-30 DIAGNOSIS — R627 Adult failure to thrive: Secondary | ICD-10-CM | POA: Diagnosis present

## 2022-03-30 DIAGNOSIS — Z953 Presence of xenogenic heart valve: Secondary | ICD-10-CM

## 2022-03-30 DIAGNOSIS — Z6820 Body mass index (BMI) 20.0-20.9, adult: Secondary | ICD-10-CM

## 2022-03-30 DIAGNOSIS — L89302 Pressure ulcer of unspecified buttock, stage 2: Secondary | ICD-10-CM | POA: Diagnosis present

## 2022-03-30 DIAGNOSIS — L8990 Pressure ulcer of unspecified site, unspecified stage: Secondary | ICD-10-CM | POA: Insufficient documentation

## 2022-03-30 DIAGNOSIS — G40101 Localization-related (focal) (partial) symptomatic epilepsy and epileptic syndromes with simple partial seizures, not intractable, with status epilepticus: Secondary | ICD-10-CM | POA: Diagnosis present

## 2022-03-30 DIAGNOSIS — Z8673 Personal history of transient ischemic attack (TIA), and cerebral infarction without residual deficits: Secondary | ICD-10-CM

## 2022-03-30 DIAGNOSIS — G4733 Obstructive sleep apnea (adult) (pediatric): Secondary | ICD-10-CM | POA: Diagnosis present

## 2022-03-30 DIAGNOSIS — G40901 Epilepsy, unspecified, not intractable, with status epilepticus: Secondary | ICD-10-CM | POA: Diagnosis present

## 2022-03-30 DIAGNOSIS — Z515 Encounter for palliative care: Secondary | ICD-10-CM | POA: Diagnosis not present

## 2022-03-30 DIAGNOSIS — Z87891 Personal history of nicotine dependence: Secondary | ICD-10-CM

## 2022-03-30 DIAGNOSIS — R29724 NIHSS score 24: Secondary | ICD-10-CM | POA: Diagnosis present

## 2022-03-30 DIAGNOSIS — J439 Emphysema, unspecified: Secondary | ICD-10-CM | POA: Diagnosis present

## 2022-03-30 DIAGNOSIS — Z7951 Long term (current) use of inhaled steroids: Secondary | ICD-10-CM

## 2022-03-30 DIAGNOSIS — J9622 Acute and chronic respiratory failure with hypercapnia: Secondary | ICD-10-CM | POA: Diagnosis present

## 2022-03-30 DIAGNOSIS — I959 Hypotension, unspecified: Secondary | ICD-10-CM | POA: Diagnosis present

## 2022-03-30 DIAGNOSIS — G8929 Other chronic pain: Secondary | ICD-10-CM | POA: Diagnosis present

## 2022-03-30 DIAGNOSIS — Z79899 Other long term (current) drug therapy: Secondary | ICD-10-CM

## 2022-03-30 LAB — COMPREHENSIVE METABOLIC PANEL
ALT: 22 U/L (ref 0–44)
AST: 32 U/L (ref 15–41)
Albumin: 2.6 g/dL — ABNORMAL LOW (ref 3.5–5.0)
Alkaline Phosphatase: 128 U/L — ABNORMAL HIGH (ref 38–126)
Anion gap: 9 (ref 5–15)
BUN: 26 mg/dL — ABNORMAL HIGH (ref 8–23)
CO2: 22 mmol/L (ref 22–32)
Calcium: 10.9 mg/dL — ABNORMAL HIGH (ref 8.9–10.3)
Chloride: 104 mmol/L (ref 98–111)
Creatinine, Ser: 0.72 mg/dL (ref 0.61–1.24)
GFR, Estimated: >60 mL/min (ref >60)
Glucose, Bld: 91 mg/dL (ref 70–99)
Potassium: 3.5 mmol/L (ref 3.5–5.1)
Sodium: 135 mmol/L (ref 135–145)
Total Bilirubin: 0.6 mg/dL (ref 0.3–1.2)
Total Protein: 6.3 g/dL — ABNORMAL LOW (ref 6.5–8.1)

## 2022-03-30 LAB — ETHANOL: Alcohol, Ethyl (B): <10 mg/dL (ref <10)

## 2022-03-30 LAB — URINALYSIS, ROUTINE W REFLEX MICROSCOPIC
Bacteria, UA: NONE SEEN
Bilirubin Urine: NEGATIVE
Glucose, UA: NEGATIVE mg/dL
Hgb urine dipstick: NEGATIVE
Ketones, ur: NEGATIVE mg/dL
Leukocytes,Ua: NEGATIVE
Nitrite: NEGATIVE
Protein, ur: NEGATIVE mg/dL
Specific Gravity, Urine: 1.014 (ref 1.005–1.030)
pH: 5 (ref 5.0–8.0)

## 2022-03-30 LAB — CBC WITH DIFFERENTIAL/PLATELET
Abs Immature Granulocytes: 0.09 10*3/uL — ABNORMAL HIGH (ref 0.00–0.07)
Basophils Absolute: 0 10*3/uL (ref 0.0–0.1)
Basophils Relative: 0 %
Eosinophils Absolute: 0.1 10*3/uL (ref 0.0–0.5)
Eosinophils Relative: 1 %
HCT: 34.1 % — ABNORMAL LOW (ref 39.0–52.0)
Hemoglobin: 11.1 g/dL — ABNORMAL LOW (ref 13.0–17.0)
Immature Granulocytes: 1 %
Lymphocytes Relative: 7 %
Lymphs Abs: 0.9 10*3/uL (ref 0.7–4.0)
MCH: 29.6 pg (ref 26.0–34.0)
MCHC: 32.6 g/dL (ref 30.0–36.0)
MCV: 90.9 fL (ref 80.0–100.0)
Monocytes Absolute: 1.1 10*3/uL — ABNORMAL HIGH (ref 0.1–1.0)
Monocytes Relative: 9 %
Neutro Abs: 9.6 10*3/uL — ABNORMAL HIGH (ref 1.7–7.7)
Neutrophils Relative %: 82 %
Platelets: 377 10*3/uL (ref 150–400)
RBC: 3.75 MIL/uL — ABNORMAL LOW (ref 4.22–5.81)
RDW: 14.2 % (ref 11.5–15.5)
WBC: 11.7 10*3/uL — ABNORMAL HIGH (ref 4.0–10.5)
nRBC: 0 % (ref 0.0–0.2)

## 2022-03-30 LAB — PHENYTOIN LEVEL, TOTAL: Phenytoin Lvl: 3.9 ug/mL — ABNORMAL LOW (ref 10.0–20.0)

## 2022-03-30 MED ORDER — IOHEXOL 350 MG/ML SOLN
75.0000 mL | Freq: Once | INTRAVENOUS | Status: AC | PRN
  Administered 2022-03-30: 75 mL via INTRAVENOUS

## 2022-03-30 MED ORDER — LORAZEPAM 2 MG/ML IJ SOLN
2.0000 mg | Freq: Once | INTRAMUSCULAR | Status: DC
  Filled 2022-03-30: qty 1

## 2022-03-30 MED ORDER — LORAZEPAM 2 MG/ML IJ SOLN
INTRAMUSCULAR | Status: AC
  Filled 2022-03-30: qty 1

## 2022-03-30 MED ORDER — LEVETIRACETAM IN NACL 1000 MG/100ML IV SOLN
2000.0000 mg | Freq: Once | INTRAVENOUS | Status: AC
  Administered 2022-03-30: 2000 mg via INTRAVENOUS
  Filled 2022-03-30: qty 200

## 2022-03-30 NOTE — Consult Note (Addendum)
NEURO HOSPITALIST CONSULT NOTE   Requestig physician: Dr. Alvino Chapel  Reason for Consult: Focal status epilepticus  History obtained from:  Daughter and Chart     HPI:                                                                                                                                          Danny Krause is an 81 y.o. 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 LKN was 11:00 PM yesterday when he went to bed. At his baseline he is conversant but with RLE weakness thought to be secondary to recently diagnosed lumbar spinal tumor by imaging. On awakening this AM he had worsened RLE weakness. Later today, daughter checked on him and initially he was speaking normally, but then he suddenly became aphasic with garbled, nonsensical speech. He could follow some simple instructions but speech continued to be garbled. EMS was called and on their arrival then noted focal right sided seizure activity with rightward gaze and bilateral nystagmus. He was administered 5 mg IM Versed which stopped the twitching. VS with EMS: 142/78, HR 100 with first degree block, capnography 23, CBG 86.   On arrival to the ED his speech remained incomprehensible. He gradually became more sedated, felt likely to be due to delayed effect of the Versed with occasional short incomprehensible utterances. Some intermittent spasmodic right foot twitching was noted by EDP, but no other right sided movement was seen. The right sided weakness was thought to be due to a stroke versus Todd's paralysis. Neurology was called to evaluate the patient STAT and during the  assessment decision was made to call a Code Stroke to assess for possible LVO.   Per his daughter, he has had about 5-6 total seizures since his initial seizure in 2019 which had presented as a focal onset seizure evolving to generalized status epilepticus and required inpatient admission here at [facility]. He has had about 1 seizure per year since then. His typical seizures are focal in onset, followed by secondary generalization and lasting for a total of about 1 minute.   Past Medical History:  Diagnosis Date   Anginal pain ([place])    with exertion   Coronary artery disease    Critical aortic valve stenosis    Dyspnea    with exertion   Encephalopathy chronic 02/13/2018   Murmur    Seizure (HCC)    Stroke [facility])    Syncope  Past Surgical History:  Procedure Laterality Date   AORTIC VALVE REPLACEMENT N/A 07/04/2017   Procedure: AORTIC VALVE REPLACEMENT (AVR);  Surgeon: Grace Isaac, MD;  Location: [place];  Service: Open Heart Surgery;  Laterality: N/A;   CORONARY ARTERY BYPASS GRAFT N/A 07/04/2017   Procedure: CORONARY ARTERY BYPASS GRAFTING (CABG) x three, using left internal mammary artery and right leg greater saphenous vein harvested endoscopically;  Surgeon: Grace Isaac, MD;  Location: [place];  Service: Open Heart Surgery;  Laterality: N/A;   ESOPHAGOGASTRODUODENOSCOPY (EGD) WITH PROPOFOL N/A 03/02/2020   Procedure: ESOPHAGOGASTRODUODENOSCOPY (EGD) WITH PROPOFOL;  Surgeon: Carol Ada, MD;  [facility];  Service: Endoscopy;  Laterality: N/A;   MALONEY DILATION  03/02/2020   Procedure: Venia Minks DILATION;  Surgeon: Carol Ada, MD;  [facility];  Service: Endoscopy;;   NO PAST SURGERIES     RIGHT/LEFT HEART CATH AND CORONARY ANGIOGRAPHY N/A 06/25/2017   Procedure: RIGHT/LEFT HEART CATH AND CORONARY ANGIOGRAPHY;  Surgeon: Troy Sine, MD;  Location: [place] CV [facility];  Service: Cardiovascular;  Laterality: N/A;   TEE WITHOUT CARDIOVERSION N/A  07/04/2017   Procedure: TRANSESOPHAGEAL ECHOCARDIOGRAM (TEE);  Surgeon: Grace Isaac, MD;  Location: [place];  Service: Open Heart Surgery;  Laterality: N/A;   THORACIC AORTOGRAM  06/25/2017   Procedure: THORACIC AORTOGRAM;  Surgeon: Troy Sine, MD;  Location: [place] [facility];  Service: Cardiovascular;;  aortic root    TRACHEOSTOMY CLOSURE  02/2018    Family History  Problem Relation Age of Onset   Hernia Father    Stroke Brother             Social History:  reports that he quit smoking about 4 years ago. His smoking use included cigarettes. He has a 128.00 pack-year smoking history. He has never used smokeless tobacco. He reports that he does not drink alcohol and does not use drugs.  No Known Allergies  HOME MEDICATIONS:                                                                                                                      No current facility-administered medications on file prior to encounter.   Current Outpatient Medications on File Prior to Encounter  Medication Sig Dispense Refill   acetaminophen (TYLENOL) 325 MG tablet Take 325 mg by mouth every 6 (six) hours as needed for mild pain, fever or headache.      albuterol (VENTOLIN HFA) 108 (90 Base) MCG/ACT inhaler INHALE 1 TO 2 PUFFS INTO THE LUNGS EVERY 6 HOURS AS NEEDED FOR WHEEZING OR SHORTNESS OF BREATH 8.5 g 5   aspirin 81 MG EC tablet Take 81 mg by mouth daily.      atorvastatin (LIPITOR) 40 MG tablet TAKE 1 TABLET BY MOUTH EVERY DAY 90 tablet 0   Budeson-Glycopyrrol-Formoterol (BREZTRI AEROSPHERE) 160-9-4.8 MCG/ACT AERO Inhale 2 puffs into the lungs 2 (two) times daily. 10.7 g 11   fluticasone (FLONASE) 50 MCG/ACT nasal spray Place 1 spray  into both nostrils daily. 16 g 2   guaiFENesin (MUCINEX) 600 MG 12 hr tablet Take 2 tablets (1,200 mg total) by mouth 2 (two) times daily as needed for cough or to loosen phlegm.     hydrALAZINE (APRESOLINE) 10 MG tablet TAKE 1 TABLET(10 MG) BY MOUTH THREE TIMES  DAILY 270 tablet 3   levETIRAcetam (KEPPRA) 250 MG tablet 2 tablets twice daily for 3 weeks, then take 1 tablet twice daily for 3 weeks, then stop 126 tablet 0   losartan (COZAAR) 100 MG tablet TAKE 1 TABLET(100 MG) BY MOUTH DAILY 60 tablet 0   Multiple Vitamins-Minerals (MULTIVITAMIN ADULTS PO) Take 1 tablet by mouth daily.     phenytoin (DILANTIN) 100 MG ER capsule TAKE 1 CAPSULE(100 MG) BY MOUTH THREE TIMES DAILY. 270 capsule 3   sertraline (ZOLOFT) 50 MG tablet Take 1 tablet (50 mg total) by mouth daily. 90 tablet 3   Spacer/Aero-Holding Chambers DEVI Use with your symbicort and your rescue inhaler 1 each 0   zonisamide (ZONEGRAN) 100 MG capsule Take 1 capsule (100 mg total) by mouth 2 (two) times daily. 180 capsule 1  Note: Not on Keppra currently.    ROS:                                                                                                                                       Unable to obtain due to lethargy.    Blood pressure (!) 146/72, pulse 82, temperature 98.5 F (36.9 C), temperature source Rectal, resp. rate 15, height 5\' 5"  (1.651 m), weight 57 kg, SpO2 100 %.   General Examination:                                                                                                       Physical Exam  HEENT-  [place]/AT    Lungs- Respirations unlabored Extremities- No edema    Neurological Examination Mental Status: Lethargic. Eyes are closed to half-closed for most of the evaluation. Occasional garbled utterances which contain no comprehensible speech content. Does not follow any commands.  Cranial Nerves: II: No blink to threat. PERRL.   III,IV, VI: Eyes are conjugate near the midline without nystagmus or forced deviation at the time of the assessment. Does not track. Weak doll's eye reflex is present.  V: Closes eyes to eyelid stimulation bilaterally.  VII: Does not grimace to noxious or smile on command. Equivocal right facial droop at rest.  VIII: Alerts to  daughter's voice  IX,X: Deferred gag reflex XI: Head preferentially rotated and laterally flexed to the right.  XII: Does not follow commands for testing.  Motor/Sensory: RUE 0/5 proximally and distally with flaccid tone. No jerking/twitching noted.  RLE 0/5 proximally and distally with flaccid tone. Rhythmic foot twitching was noted by EDP that had subsided by the time of neurology arrival.  LUE and LLE with weak spontaneous movements but no antigravity movement or withdrawal to repeated light noxious stimuli. Decreased tone to LUE and LLE but not as severe as on the right.   Does not respond to right sided noxious stimuli Deep Tendon Reflexes: 1+ bilateral brachioradialis and patellae. 1+ right achilles, 0 left achilles. Toes mute bilaterally  Cerebellar/Gait: Unable to assess  NIHSS: 24   Lab Results: Basic Metabolic Panel: No results for input(s): "NA", "K", "CL", "CO2", "GLUCOSE", "BUN", "CREATININE", "CALCIUM", "MG", "PHOS" in the last 168 hours.  CBC: No results for input(s): "WBC", "NEUTROABS", "HGB", "HCT", "MCV", "PLT" in the last 168 hours.  Cardiac Enzymes: No results for input(s): "CKTOTAL", "CKMB", "CKMBINDEX", "TROPONINI" in the last 168 hours.  Lipid Panel: No results for input(s): "CHOL", "TRIG", "HDL", "CHOLHDL", "VLDL", "LDLCALC" in the last 168 hours.  Imaging: No results found.   Assessment: 81 year old male with a history of seizures presenting with focal status epilepticus. He received 5 mg Versed in the field and 2 mg IV Ativan prior to Neurology assessment.  - Exam reveals right hemiplegia, lethargy and occasional garbled utterances which contain no comprehensible speech content. Does not follow any commands. No clinical seizure activity noted.  - Phenytoin level is low at 3.9. Daughter states he is compliant as she administers her meds. May be a fast metabolizer.  - Also on Zonegran at home. - Formerly on Keppra which had been discontinued due to  irritability - DDx for right sided weakness includes acute stroke, but more likely Todd's paralysis. Continued AMS with garbled speech may be due to a large hemispheric stroke versus postictal state versus subclinical status epilepticus   Recommendations: - STAT Keppra load of 2000 mg IV has been ordered - STAT EEG  - STAT CT head - Continue Dilantin using IV formulation at 100 mg TID (ordered) - Continue Zonegran at 100 mg BID. Will need to administer via NGT (ordered) - Inpatient seizure precautions  60 minutes spent in the emergent neurological evaluation and management of this neurocritically ill patient with high risk of neurological decompensation  Addendum: - STAT CT head: 1.9 x 1.5 x 1.7 cm heterogeneous hyperdensity at the parasagittal left parieto-occipital region. While this finding could reflect a simple hemorrhage, the heterogeneous appearance raises the concern for possible underlying lesion/mass. Further evaluation with dedicated brain MRI, with and without contrast recommended. No significant surrounding edema or mass effect. Underlying atrophy with chronic small vessel ischemic disease with small remote right frontal infarct. - STAT CTA of head and neck: Negative CTA for large vessel occlusion or other emergent finding. Mild-to-moderate atheromatous disease, but no hemodynamically significant or correctable stenosis. Aortic atherosclerosis.  - Also noted on STAT CTA is an 8.0 x 4.4 cm left upper lobe mass, partially visualized, and concerning for a primary bronchogenic carcinoma. Further evaluation with dedicated cross-sectional imaging of the chest recommended. Given this finding, the previously identified hyperdense lesion at the left parieto-occipital region is most concerning for a possible metastatic lesion.    Addendum: - STAT EEG reveals focal electrographic status epilepticus with seizures arising from the left hemisphere. No clinical seizure activity while  EEG was  running.  - Repeat exam is slightly worse revealing evolution from lethargy to obtundation with no clinical seizure activity seen.  - Switching EEG to LTM - Loading with supplemental dose of fosphenytoin 20 mgPE/kg STAT - If electrographic seizures do not subside after fosphenytoin load, intubation and sedation towards goal of burst suppression will not be an option as family has elected to make him DNR/DNI.   20 additional minutes of critical care time  Addendum: LTM EEG reviewed to 3:50 AM. Electrographic seizures have resolved since fosphenytoin administration. Still with asymmetry and intermittent slowing.   Electronically signed: Dr. Kerney Elbe 04/03/2022, 8:51 PM

## 2022-03-30 NOTE — ED Notes (Signed)
Pt desatting to 70s on 2L with good pleth, [place] increased to 6L with little improvement - placed on NRB at this time - MD aware

## 2022-03-30 NOTE — Progress Notes (Signed)
EEG complete - results pending 

## 2022-03-30 NOTE — ED Triage Notes (Signed)
Pt BIB EMS from home. Per EMS pt daughter reports pt not acting like himself - pt usually talks and ambulates with walker at baseline - but today has incomprehensible speech. When EMS arrived pt was having focal seizure with nystagmus in both eyes - EMS gave 5 of Versed IM - Pt stopped twitching after that. Speech remains aphasic/incomprehensible.  Family worried about possible UTI.  VS with EMS  142/78 HR 100 with first degree block  Capnography 23 CBG 86

## 2022-03-30 NOTE — H&P (Addendum)
NAME:  Danny Krause, MRN:  [id number], DOB:  11-07-1940, LOS: 0 ADMISSION DATE:  03/09/2022, CONSULTATION DATE: 7/27 REFERRING MD: Dr. Alvino Chapel EDP, CHIEF COMPLAINT: Seizure  History of Present Illness:  81 year old male with past medical history as below, which significant for COPD with chronic hypoxic respiratory failure on home O2, CAD status post CABG, and seizure disorder with admission for status epilepticus previously requiring intubation and tracheostomy, from which he was able to be liberated.  The patient has struggled with chronic back pain for some time.    Over the last 2 months specifically he has had a significant deterioration in functional capacity.  He is also lost appetite and therefore lost weight.  Due to worsening back pain and overall deterioration he was sent for MRI of the lumbar spine on 7/23 which was unfortunately significant for large tumor centered within L2-L3 with associated spinal canal stenosis.  Despite this he was mostly in good spirits.    Then on 7/27 he was noted to be babbling incoherent speech to family members who alerted EMS.  Upon EMS arrival the patient was actively seizing which abated with 5 mg of Versed.  Upon arrival to the ED he was able to give yes and no answers but was minimally responsive.  CT scan of the head concerning for intracranial hemorrhage and possibly mass.  PCCM asked to admit to ICU.  Pertinent  Medical History   has a past medical history of Anginal pain ([place]), Coronary artery disease, Critical aortic valve stenosis, Dyspnea, Encephalopathy chronic (02/13/2018), Murmur, Seizure ([place]), Stroke ([place]), and Syncope.   Significant Hospital Events: Including procedures, antibiotic start and stop dates in addition to other pertinent events   7/27 admit for status epilepticus  Interim History / Subjective:    Objective   Blood pressure 121/75, pulse (!) 106, temperature 98.5 F (36.9 C), temperature source Rectal, resp. rate (!)  22, height 5\' 5"  (1.651 m), weight 57 kg, SpO2 93 %.        Intake/Output Summary (Last 24 hours) at 03/09/2022 2318 Last data filed at 03/24/2022 2134 Gross per 24 hour  Intake 93.33 ml  Output --  Net 93.33 ml   Filed Weights   03/08/2022 2013  Weight: 57 kg    Examination: General: Frail elderly appearing male in NAD HENT: North, AT, Pupils pinpoint.  Lungs: Clear bilateral breath sounds. No distress. Bradypnea sonorous respirations.  Cardiovascular: RRR, no MRG Abdomen: Soft, non-tender, non-distended Extremities: No acute deformity or ROM limitation Neuro: Coma   MRI L spine: large mass L2- L3 with significant spinal stenosis  CTA head neck 7/28: 1.9 x 1.5 x 1.7 cm heterogeneous hyperdensity at the parasagittal left parieto-occipital region. While this finding could reflect a simple hemorrhage, the heterogeneous appearance raises the concern for possible underlying lesion/mass. Lung apices with severe emphesma. LUL mass identified.   Resolved Hospital Problem list     Assessment & Plan:   Status epilepticus: witnessed sz by EMS with history of seizure disorder and history of SE.  Managed with Dilantin and Zonegran. Now 7/27 with one episode of sz, but has not returned to baseline now 2 hrs post ictally. Dilantin level low.  - Keppra given in ED - AED per neurology who have been consulted - LTM EEG - Patient's daughter is clear he would not want intubation should his treatment require it - I suspect his prognosis to be quite poor.   ICH: - Keep SBP < 160 - Not a surgical  candidate at this time  Metastatic cancer: unknown primary, but masses identified in the left upper lobe, lumbar spine, and brain. - Will need further workup should he improve  Acute on chronic hypoxemic respiratory failure: on 2L at home COPD without acute exacerbation OSA not on CPAP - Supplemental O2 to keep sats 90-95% - Triple therapy nebs in place of home breztri - DNI per daughter/HCPOA  CAD  s/p CABG HTN HLD - Holding home atorvastatin, asa, hydralazine,     Best Practice (right click and "Reselect all SmartList Selections" daily)   Diet/type: NPO DVT prophylaxis: other GI prophylaxis: PPI Lines: N/A Foley:  N/A Code Status:  DNR Last date of multidisciplinary goals of care discussion [ Discussed with daughter in ED 7/27]  Labs   CBC: Recent Labs  Lab 03/13/2022 2035  WBC 11.7*  NEUTROABS 9.6*  HGB 11.1*  HCT 34.1*  MCV 90.9  PLT 270    Basic Metabolic Panel: Recent Labs  Lab 03/09/2022 2035  NA 135  K 3.5  CL 104  CO2 22  GLUCOSE 91  BUN 26*  CREATININE 0.72  CALCIUM 10.9*   GFR: Estimated Creatinine Clearance: 58.4 mL/min (by C-G formula based on SCr of 0.72 mg/dL). Recent Labs  Lab 03/12/2022 2035  WBC 11.7*    Liver Function Tests: Recent Labs  Lab 03/10/2022 2035  AST 32  ALT 22  ALKPHOS 128*  BILITOT 0.6  PROT 6.3*  ALBUMIN 2.6*   No results for input(s): "LIPASE", "AMYLASE" in the last 168 hours. No results for input(s): "AMMONIA" in the last 168 hours.  ABG    Component Value Date/Time   PHART 7.411 03/27/2018 1510   PCO2ART 38.9 03/27/2018 1510   PO2ART 70.6 (L) 03/27/2018 1510   HCO3 24.2 03/27/2018 1510   TCO2 22 03/27/2018 0247   ACIDBASEDEF 2.3 (H) 02/12/2018 1022   O2SAT 94.4 03/27/2018 1510     Coagulation Profile: No results for input(s): "INR", "PROTIME" in the last 168 hours.  Cardiac Enzymes: No results for input(s): "CKTOTAL", "CKMB", "CKMBINDEX", "TROPONINI" in the last 168 hours.  HbA1C: Hgb A1c MFr Bld  Date/Time Value Ref Range Status  02/13/2018 06:23 AM 5.9 (H) 4.8 - 5.6 % Final    Comment:    (NOTE) Pre diabetes:          5.7%-6.4% Diabetes:              >6.4% Glycemic control for   <7.0% adults with diabetes   02/07/2018 01:40 PM 5.9 (H) 4.8 - 5.6 % Final    Comment:    (NOTE) Pre diabetes:          5.7%-6.4% Diabetes:              >6.4% Glycemic control for   <7.0% adults with  diabetes     CBG: No results for input(s): "GLUCAP" in the last 168 hours.  Review of Systems:   Patient is encephalopathic and/or intubated. Therefore history has been obtained from chart review.    Past Medical History:  He,  has a past medical history of Anginal pain ([place]), Coronary artery disease, Critical aortic valve stenosis, Dyspnea, Encephalopathy chronic (02/13/2018), Murmur, Seizure ([place]), Stroke ([place]), and Syncope.   Surgical History:   Past Surgical History:  Procedure Laterality Date   AORTIC VALVE REPLACEMENT N/A 07/04/2017   Procedure: AORTIC VALVE REPLACEMENT (AVR);  Surgeon: Grace Isaac, MD;  Location: [place];  Service: Open Heart Surgery;  Laterality: N/A;   CORONARY  ARTERY BYPASS GRAFT N/A 07/04/2017   Procedure: CORONARY ARTERY BYPASS GRAFTING (CABG) x three, using left internal mammary artery and right leg greater saphenous vein harvested endoscopically;  Surgeon: Grace Isaac, MD;  Location: [place];  Service: Open Heart Surgery;  Laterality: N/A;   ESOPHAGOGASTRODUODENOSCOPY (EGD) WITH PROPOFOL N/A 03/02/2020   Procedure: ESOPHAGOGASTRODUODENOSCOPY (EGD) WITH PROPOFOL;  Surgeon: Carol Ada, MD;  [facility];  Service: Endoscopy;  Laterality: N/A;   MALONEY DILATION  03/02/2020   Procedure: Venia Minks DILATION;  Surgeon: Carol Ada, MD;  [facility];  Service: Endoscopy;;   NO PAST SURGERIES     RIGHT/LEFT HEART CATH AND CORONARY ANGIOGRAPHY N/A 06/25/2017   Procedure: RIGHT/LEFT HEART CATH AND CORONARY ANGIOGRAPHY;  Surgeon: Troy Sine, MD;  Location: [place] [facility];  Service: Cardiovascular;  Laterality: N/A;   TEE WITHOUT CARDIOVERSION N/A 07/04/2017   Procedure: TRANSESOPHAGEAL ECHOCARDIOGRAM (TEE);  Surgeon: Grace Isaac, MD;  Location: [place];  Service: Open Heart Surgery;  Laterality: N/A;   THORACIC AORTOGRAM  06/25/2017   Procedure: THORACIC AORTOGRAM;  Surgeon: Troy Sine, MD;  Location: [place] [facility]  [facility];  Service: Cardiovascular;;  aortic root    TRACHEOSTOMY CLOSURE  02/2018     Social History:   reports that he quit smoking about 4 years ago. His smoking use included cigarettes. He has a 128.00 pack-year smoking history. He has never used smokeless tobacco. He reports that he does not drink alcohol and does not use drugs.   Family History:  His family history includes Hernia in his father; Stroke in his brother.   Allergies No Known Allergies   Home Medications  Prior to Admission medications   Medication Sig Start Date End Date Taking? Authorizing Provider  acetaminophen (TYLENOL) 325 MG tablet Take 325 mg by mouth every 6 (six) hours as needed for mild pain, fever or headache.     [provider]  albuterol (VENTOLIN HFA) 108 (90 Base) MCG/ACT inhaler INHALE 1 TO 2 PUFFS INTO THE LUNGS EVERY 6 HOURS AS NEEDED FOR WHEEZING OR SHORTNESS OF BREATH 04/28/20   Lauraine Rinne, NP  aspirin 81 MG EC tablet Take 81 mg by mouth daily.     [provider]  atorvastatin (LIPITOR) 40 MG tablet TAKE 1 TABLET BY MOUTH EVERY DAY 03/01/22   Hilty, Nadean Corwin, MD  Budeson-Glycopyrrol-Formoterol (BREZTRI AEROSPHERE) 160-9-4.8 MCG/ACT AERO Inhale 2 puffs into the lungs 2 (two) times daily. 01/17/22   Chesley Mires, MD  fluticasone (FLONASE) 50 MCG/ACT nasal spray Place 1 spray into both nostrils daily. 04/11/21   Chesley Mires, MD  guaiFENesin (MUCINEX) 600 MG 12 hr tablet Take 2 tablets (1,200 mg total) by mouth 2 (two) times daily as needed for cough or to loosen phlegm. 04/11/21   Chesley Mires, MD  hydrALAZINE (APRESOLINE) 10 MG tablet TAKE 1 TABLET(10 MG) BY MOUTH THREE TIMES DAILY 05/23/21   Hilty, Nadean Corwin, MD  levETIRAcetam (KEPPRA) 250 MG tablet 2 tablets twice daily for 3 weeks, then take 1 tablet twice daily for 3 weeks, then stop 06/17/21   Kathrynn Ducking, MD  losartan (COZAAR) 100 MG tablet TAKE 1 TABLET(100 MG) BY MOUTH DAILY 01/13/22   Hilty, Nadean Corwin, MD  Multiple  Vitamins-Minerals (MULTIVITAMIN ADULTS PO) Take 1 tablet by mouth daily.    [provider]  phenytoin (DILANTIN) 100 MG ER capsule TAKE 1 CAPSULE(100 MG) BY MOUTH THREE TIMES DAILY. 10/31/21   Suzzanne Cloud, NP  sertraline (  ZOLOFT) 50 MG tablet Take 1 tablet (50 mg total) by mouth daily. 05/18/21   Kathrynn Ducking, MD  Spacer/Aero-Holding Dorise Bullion Use with your symbicort and your rescue inhaler 07/08/18   Lauraine Rinne, NP  zonisamide (ZONEGRAN) 100 MG capsule Take 1 capsule (100 mg total) by mouth 2 (two) times daily. 01/23/22   Suzzanne Cloud, NP     Critical care time: 57 minutes     Georgann Housekeeper, AGACNP-BC [place] for personal pager PCCM on call pager [phone] until 7pm. Please call Elink 7p-7a. [phone]  03/31/2022 12:39 AM

## 2022-03-30 NOTE — ED Provider Notes (Signed)
[facility] EMERGENCY DEPARTMENT Provider Note   CSN: [id number] Arrival date & time: 03/26/2022  2000  An emergency department physician performed an initial assessment on this suspected stroke patient at 2119.  History  Chief Complaint  Patient presents with   Seizures   Altered Mental Status    Danny Krause is a 81 y.o. male.   Seizures Altered Mental Status Associated symptoms: seizures   Patient brought in by EMS.  Reportedly had call from family for mental status change.  Difficulty speaking.  However was also not moving her right side.  EMS arrived and found patient seizing on the right side and right face twitching with reportedly eyes twitching also.  Given 5 of IM Versed.  Decreased responsiveness upon arrival.  Would however follow some commands on the left side but still flaccid on the right.  Patient's daughter later arrives and is able to give more history.  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  Plan to get biopsy and it appears CT scan.  Has been having pain in that leg for a while but was not moving it earlier today.  Had some spasm-like activity in the leg.  Upon arrival for me does have some twitching in the foot.  Would look to the right but still would only say yes or no.  Later that mental status decreased also.  With potential seizure activity discussed emergently with Dr. Cheral Marker who came to bedside to see patient.  Will give 2 g of IM Keppra.  Had reviewed previous neurology notes.  Currently on Dilantin and Zonegran.  Had not tolerated Keppra due to some emotional issues with prior dosing.  However we will load now to help abort the seizure.  With right-sided weakness code stroke was also called.  Not a TNK candidate since normal would have been around last night and also has a known malignancy on the spine.  Taken emergently to CT scan however.    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 Authorizing Provider  acetaminophen (TYLENOL) 325 MG tablet Take 325 mg by mouth every 6 (six) hours as needed for mild pain, fever or headache.     [provider]  albuterol (VENTOLIN HFA) 108 (90 Base) MCG/ACT inhaler INHALE 1 TO 2 PUFFS INTO THE LUNGS EVERY 6 HOURS AS NEEDED FOR WHEEZING OR SHORTNESS OF BREATH 04/28/20   Lauraine Rinne, NP  aspirin 81 MG EC tablet Take 81 mg by mouth daily.     [provider]  atorvastatin (LIPITOR) 40 MG tablet TAKE 1 TABLET BY MOUTH EVERY DAY 03/01/22   Hilty, Nadean Corwin, MD  Budeson-Glycopyrrol-Formoterol (BREZTRI AEROSPHERE) 160-9-4.8 MCG/ACT AERO Inhale 2 puffs into the lungs 2 (two) times daily. 01/17/22   Chesley Mires, MD  fluticasone (FLONASE) 50 MCG/ACT nasal spray Place 1 spray into both nostrils daily. 04/11/21   Chesley Mires, MD  guaiFENesin (MUCINEX) 600 MG 12 hr tablet Take 2 tablets (1,200 mg total) by mouth 2 (two) times daily as  needed for cough or to loosen phlegm. 04/11/21   Chesley Mires, MD  hydrALAZINE (APRESOLINE) 10 MG tablet TAKE 1 TABLET(10 MG) BY MOUTH THREE TIMES DAILY 05/23/21   Hilty, Nadean Corwin, MD  levETIRAcetam (KEPPRA) 250 MG tablet 2 tablets twice daily for 3 weeks, then take 1 tablet twice daily for 3 weeks, then stop 06/17/21   Kathrynn Ducking, MD  losartan (COZAAR) 100 MG tablet TAKE 1 TABLET(100 MG) BY MOUTH DAILY 01/13/22   Hilty, Nadean Corwin, MD  Multiple Vitamins-Minerals (MULTIVITAMIN ADULTS PO) Take 1 tablet by mouth daily.    [provider]  phenytoin (DILANTIN) 100 MG ER capsule TAKE 1 CAPSULE(100 MG) BY MOUTH THREE TIMES DAILY. 10/31/21   Suzzanne Cloud, NP   sertraline (ZOLOFT) 50 MG tablet Take 1 tablet (50 mg total) by mouth daily. 05/18/21   Kathrynn Ducking, MD  Spacer/Aero-Holding Dorise Bullion Use with your symbicort and your rescue inhaler 07/08/18   Lauraine Rinne, NP  zonisamide (ZONEGRAN) 100 MG capsule Take 1 capsule (100 mg total) by mouth 2 (two) times daily. 01/23/22   Suzzanne Cloud, NP      Allergies    Patient has no known allergies.    Review of Systems   Review of Systems  Neurological:  Positive for seizures.    Physical Exam Updated Vital Signs BP 108/62   Pulse (!) 105   Temp 98.5 F (36.9 C) (Rectal)   Resp (!) 22   Ht 5\' 5"  (1.651 m)   Wt 57 kg   SpO2 94%   BMI 20.91 kg/m  Physical Exam Vitals reviewed.  HENT:     Head: Atraumatic.  Cardiovascular:     Rate and Rhythm: Regular rhythm.  Pulmonary:     Breath sounds: No wheezing or rhonchi.  Abdominal:     Tenderness: There is no abdominal tenderness.  Musculoskeletal:        General: No tenderness.     Cervical back: Neck supple.  Neurological:     Comments: Had some twitching of right foot but no other movement on the lower extremity.  Flaccid on right arm.  Would move left arms and squeeze to command.  Some movement to pain on left leg.  Will not follow commands overall on face appears grossly symmetric.  Pupils appear reactive.     ED Results / Procedures / Treatments   Labs (all labs ordered are listed, but only abnormal results are displayed) Labs Reviewed  COMPREHENSIVE METABOLIC PANEL - Abnormal; Notable for the following components:      Result Value   BUN 26 (*)    Calcium 10.9 (*)    Total Protein 6.3 (*)    Albumin 2.6 (*)    Alkaline Phosphatase 128 (*)    All other components within normal limits  CBC WITH DIFFERENTIAL/PLATELET - Abnormal; Notable for the following components:   WBC 11.7 (*)    RBC 3.75 (*)    Hemoglobin 11.1 (*)    HCT 34.1 (*)    Neutro Abs 9.6 (*)    Monocytes Absolute 1.1 (*)    Abs Immature Granulocytes  0.09 (*)    All other components within normal limits  PHENYTOIN LEVEL, TOTAL - Abnormal; Notable for the following components:   Phenytoin Lvl 3.9 (*)    All other components within normal limits  ETHANOL  URINALYSIS, ROUTINE W REFLEX MICROSCOPIC    EKG None  Radiology CT HEAD CODE STROKE WO CONTRAST`  Result Date:  03/31/2022 CLINICAL DATA:  Code stroke. EXAM: CT HEAD WITHOUT CONTRAST TECHNIQUE: Contiguous axial images were obtained from the base of the skull through the vertex without intravenous contrast. RADIATION DOSE REDUCTION: This exam was performed according to the departmental dose-optimization program which includes automated exposure control, adjustment of the mA and/or kV according to patient size and/or use of iterative reconstruction technique. COMPARISON:  Prior CT from 03/27/2018. FINDINGS: Brain: Age-related cerebral atrophy with chronic small vessel ischemic disease. Chronic ischemic infarct at the anterior right frontal lobe, stable. 1.9 x 1.5 x 1.7 cm heterogeneous hyperdensity seen involving the parasagittal left parieto-occipital region (series 2, image 20). While this could reflect an intraparenchymal bleed, the heterogeneous appearance raises the concern for an underlying hemorrhagic mass. No significant surrounding edema. No other visible mass lesion. No other acute intracranial hemorrhage or large vessel territory infarct. No hydrocephalus or extra-axial fluid collection. Vascular: No hyperdense vessel. Scattered vascular calcifications noted within the carotid siphons. Skull: Scalp soft tissues and calvarium within normal limits. Sinuses/Orbits: Left gaze noted. Mild scattered mucosal thickening noted about the sphenoethmoidal sinuses. Paranasal sinuses are otherwise largely clear. No significant mastoid effusion. Other: None. ASPECTS [facility] Stroke Program Early CT Score) Acute bleed, does not apply. IMPRESSION: 1. 1.9 x 1.5 x 1.7 cm heterogeneous hyperdensity at the  parasagittal left parieto-occipital region. While this finding could reflect a simple hemorrhage, the heterogeneous appearance raises the concern for possible underlying lesion/mass. Further evaluation with dedicated brain MRI, with and without contrast recommended. No significant surrounding edema or mass effect. 2. Underlying atrophy with chronic small vessel ischemic disease with small remote right frontal infarct. These results were communicated to Dr. Cheral Marker at 9:57 pm on 03/24/2022 by text page via the [facility] messaging system. Electronically Signed   By: Jeannine Boga M.D.   On: 03/04/2022 22:00   DG Chest Portable 1 View  Result Date: 03/12/2022 CLINICAL DATA:  Altered mental status EXAM: PORTABLE CHEST 1 VIEW COMPARISON:  03/27/2018 FINDINGS: Postoperative changes in the mediastinum. Heart size and pulmonary vascularity are normal. Tortuous and calcified aorta. Emphysematous changes in the lungs. No airspace disease or consolidation. No pleural effusion. No pneumothorax. IMPRESSION: Emphysematous changes in the lungs.  No focal consolidation. Electronically Signed   By: Lucienne Capers M.D.   On: 03/04/2022 20:49    Procedures Procedures    Medications Ordered in ED Medications  LORazepam (ATIVAN) injection 2 mg ( Intravenous Not Given 03/20/2022 2210)  levETIRAcetam (KEPPRA) IVPB 1000 mg/100 mL premix (0 mg Intravenous Stopped 03/20/2022 2134)  iohexol (OMNIPAQUE) 350 MG/ML injection 75 mL (75 mLs Intravenous Contrast Given 03/12/2022 2152)    ED Course/ Medical Decision Making/ A&P                           Medical Decision Making Amount and/or Complexity of Data Reviewed Labs: ordered. Radiology: ordered.  Risk Prescription drug management.   Patient with confusion difficulty speaking and not moving right side.  Differential diagnosis is long and includes seizures and intracranial hemorrhage, encephalopathy.  Family is worried about a UTI. Code stroke called and has unfortunate  intracranial hemorrhage.  I have reviewed the CT scan and independently interpreted.  Also potentially could be bleeding metastatic disease.  Mental status has waxed and waned somewhat but still not verbal.  Still not moving right side.  Intracranial hemorrhage on the CT scan could be the cause but also potentially seizure.  We will get emergent EEG.  Loaded with  2 g of Keppra.  Reviewed previous neurology notes.  Discussed with ICU and neurology.  I think she will benefit from overnight admission to the ICU although will be evaluated by intensivist  CRITICAL CARE Performed by: Davonna Belling Total critical care time: 40 minutes Critical care time was exclusive of separately billable procedures and treating other patients. Critical care was necessary to treat or prevent imminent or life-threatening deterioration. Critical care was time spent personally by me on the following activities: development of treatment plan with patient and/or surrogate as well as nursing, discussions with consultants, evaluation of patient's response to treatment, examination of patient, obtaining history from patient or surrogate, ordering and performing treatments and interventions, ordering and review of laboratory studies, ordering and review of radiographic studies, pulse oximetry and re-evaluation of patient's condition.         Final Clinical Impression(s) / ED Diagnoses Final diagnoses:  Status epilepticus ([place])  Hemorrhagic stroke [facility])    Rx / DC Orders ED Discharge Orders     None         Davonna Belling, MD 03/04/2022 2251

## 2022-03-30 NOTE — ED Notes (Addendum)
Critical care at bedside  

## 2022-03-30 NOTE — H&P (Incomplete)
NAME:  Danny Krause, MRN:  [id number], DOB:  [birth year], LOS: 0 ADMISSION DATE:  03/31/2022, CONSULTATION DATE: 7/27 REFERRING MD: Dr. Alvino Chapel EDP, CHIEF COMPLAINT: Seizure  History of Present Illness:  81 year old male with past medical history as below, which significant for COPD with chronic hypoxic respiratory failure on home O2, CAD status post CABG, and seizure disorder with admission for status epilepticus previously requiring intubation and tracheostomy, from which he was able to be liberated.  The patient has struggled with chronic back pain for some time.    Over the last 2 months specifically he has had a significant deterioration in functional capacity.  He is also lost appetite and therefore lost weight.  Due to worsening back pain and overall deterioration he was sent for MRI of the lumbar spine on 7/23 which was unfortunately significant for large tumor centered within L2-L3 with associated spinal canal stenosis.  Despite this he was mostly in good spirits.    Then on 7/27 he was noted to be babbling incoherent speech to family members who alerted EMS.  Upon EMS arrival the patient was actively seizing which abated with 5 mg of Versed.  Upon arrival to the ED he was able to give yes and no answers but was minimally responsive.  CT scan of the head concerning for intracranial hemorrhage and possibly mass.  PCCM asked to admit to ICU.  Pertinent  Medical History   has a past medical history of Anginal pain ([place]), Coronary artery disease, Critical aortic valve stenosis, Dyspnea, Encephalopathy chronic (02/13/2018), Murmur, Seizure ([place]), Stroke ([place]), and Syncope.   Significant Hospital Events: Including procedures, antibiotic start and stop dates in addition to other pertinent events   7/27 admit for status epilepticus  Interim History / Subjective:    Objective   Blood pressure 121/75, pulse (!) 106, temperature 98.5 F (36.9 C), temperature source Rectal, resp. rate (!)  22, height 5\' 5"  (1.651 m), weight 57 kg, SpO2 93 %.        Intake/Output Summary (Last 24 hours) at 03/25/2022 2318 Last data filed at 03/17/2022 2134 Gross per 24 hour  Intake 93.33 ml  Output --  Net 93.33 ml   Filed Weights   03/17/2022 2013  Weight: 57 kg    Examination: General: Frail elderly appearing male in NAD HENT: [name], AT, Pupils pinpoint.  Lungs: Clear bilateral breath sounds. No distress. Bradypnea sonorous respirations.  Cardiovascular: RRR, no MRG Abdomen: Soft, non-tender, non-distended Extremities: No acute deformity or ROM limitation Neuro: Coma   Resolved Hospital Problem list     Assessment & Plan:   Status epilepticus: witnessed sz by EMS with history of seizure disorder and history of SE.  Managed with Dilantin and Zonegran. Now 7/27 with one episode of sz, but has not returned to baseline now 2 hrs post ictally. - Keppra given in ED - AED per neurology who have been consulted - LTM EEG - Patient's daughter is clear he would not want intubation should his treatment require it - I suspect his prognosis to be quite poor.   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, lumbar spine, and brain  Acute on chronic hypoxemic respiratory failure: on 2L at home COPD without acute exacerbation OSA not on CPAP - Supplemental O2 to keep sats 90-95% - Triple therapy nebs in place of home breztri - DNI per daughter/HCPOA  CAD  s/p CABG HTN HLD - Holding home atorvastatin, asa,     Best Practice (right click and "Reselect all SmartList Selections" daily)   Diet/type: {diet type:25684} DVT prophylaxis: {anticoagulation (Optional):25687} GI prophylaxis: {EV:03500} Lines: {Central Venous Access:25771} Foley:  {Central Venous Access:25691} Code Status:  {Code Status:26939} Last date of multidisciplinary goals of care discussion [***]  Labs   CBC: Recent  Labs  Lab 04/03/2022 2035  WBC 11.7*  NEUTROABS 9.6*  HGB 11.1*  HCT 34.1*  MCV 90.9  PLT 938    Basic Metabolic Panel: Recent Labs  Lab 03/12/2022 2035  NA 135  K 3.5  CL 104  CO2 22  GLUCOSE 91  BUN 26*  CREATININE 0.72  CALCIUM 10.9*   GFR: Estimated Creatinine Clearance: 58.4 mL/min (by C-G formula based on SCr of 0.72 mg/dL). Recent Labs  Lab 03/16/2022 2035  WBC 11.7*    Liver Function Tests: Recent Labs  Lab 03/13/2022 2035  AST 32  ALT 22  ALKPHOS 128*  BILITOT 0.6  PROT 6.3*  ALBUMIN 2.6*   No results for input(s): "LIPASE", "AMYLASE" in the last 168 hours. No results for input(s): "AMMONIA" in the last 168 hours.  ABG    Component Value Date/Time   PHART 7.411 03/27/2018 1510   PCO2ART 38.9 03/27/2018 1510   PO2ART 70.6 (L) 03/27/2018 1510   HCO3 24.2 03/27/2018 1510   TCO2 22 03/27/2018 0247   ACIDBASEDEF 2.3 (H) 02/12/2018 1022   O2SAT 94.4 03/27/2018 1510     Coagulation Profile: No results for input(s): "INR", "PROTIME" in the last 168 hours.  Cardiac Enzymes: No results for input(s): "CKTOTAL", "CKMB", "CKMBINDEX", "TROPONINI" in the last 168 hours.  HbA1C: Hgb A1c MFr Bld  Date/Time Value Ref Range Status  02/13/2018 06:23 AM 5.9 (H) 4.8 - 5.6 % Final    Comment:    (NOTE) Pre diabetes:          5.7%-6.4% Diabetes:              >6.4% Glycemic control for   <7.0% adults with diabetes   02/07/2018 01:40 PM 5.9 (H) 4.8 - 5.6 % Final    Comment:    (NOTE) Pre diabetes:          5.7%-6.4% Diabetes:              >6.4% Glycemic control for   <7.0% adults with diabetes     CBG: No results for input(s): "GLUCAP" in the last 168 hours.  Review of Systems:   ***  Past Medical History:  He,  has a past medical history of Anginal pain ([place]), Coronary artery disease, Critical aortic valve stenosis, Dyspnea, Encephalopathy chronic (02/13/2018), Murmur, Seizure ([place]), Stroke ([place]), and Syncope.   Surgical History:   Past Surgical  History:  Procedure Laterality Date  . AORTIC VALVE REPLACEMENT N/A 07/04/2017   Procedure: AORTIC VALVE REPLACEMENT (AVR);  Surgeon: Grace Isaac, MD;  Location: [facility];  Service: Open Heart Surgery;  Laterality: N/A;  . CORONARY ARTERY BYPASS GRAFT N/A 07/04/2017   Procedure: CORONARY ARTERY BYPASS GRAFTING (CABG) x three, using left internal mammary artery and right leg greater saphenous vein harvested endoscopically;  Surgeon: Grace Isaac, MD;  Location: [facility];  Service: Open Heart Surgery;  Laterality: N/A;  . ESOPHAGOGASTRODUODENOSCOPY (EGD) WITH PROPOFOL N/A 03/02/2020   Procedure: ESOPHAGOGASTRODUODENOSCOPY (EGD) WITH PROPOFOL;  Surgeon: Carol Ada, MD;  Location: [facility] ENDOSCOPY;  Service: Endoscopy;  Laterality: N/A;  . Venia Minks DILATION  03/02/2020  Procedure: Keturah Shavers;  Surgeon: Carol Ada, MD;  Location: [facility];  Service: Endoscopy;;  . NO PAST SURGERIES    . RIGHT/LEFT HEART CATH AND CORONARY ANGIOGRAPHY N/A 06/25/2017   Procedure: RIGHT/LEFT HEART CATH AND CORONARY ANGIOGRAPHY;  Surgeon: Troy Sine, MD;  Location: [facility] CV LAB;  Service: Cardiovascular;  Laterality: N/A;  . TEE WITHOUT CARDIOVERSION N/A 07/04/2017   Procedure: TRANSESOPHAGEAL ECHOCARDIOGRAM (TEE);  Surgeon: Grace Isaac, MD;  Location: [facility];  Service: Open Heart Surgery;  Laterality: N/A;  . THORACIC AORTOGRAM  06/25/2017   Procedure: THORACIC AORTOGRAM;  Surgeon: Troy Sine, MD;  Location: [facility] CV LAB;  Service: Cardiovascular;;  aortic root   . TRACHEOSTOMY CLOSURE  02/2018     Social History:   reports that he quit smoking about 4 years ago. His smoking use included cigarettes. He has a 128.00 pack-year smoking history. He has never used smokeless tobacco. He reports that he does not drink alcohol and does not use drugs.   Family History:  His family history includes Hernia in his father; Stroke in his brother.   Allergies No Known Allergies    Home Medications  Prior to Admission medications   Medication Sig Start Date End Date Taking? Authorizing Provider  acetaminophen (TYLENOL) 325 MG tablet Take 325 mg by mouth every 6 (six) hours as needed for mild pain, fever or headache.     [provider]  albuterol (VENTOLIN HFA) 108 (90 Base) MCG/ACT inhaler INHALE 1 TO 2 PUFFS INTO THE LUNGS EVERY 6 HOURS AS NEEDED FOR WHEEZING OR SHORTNESS OF BREATH 04/28/20   Lauraine Rinne, NP  aspirin 81 MG EC tablet Take 81 mg by mouth daily.     [provider]  atorvastatin (LIPITOR) 40 MG tablet TAKE 1 TABLET BY MOUTH EVERY DAY 03/01/22   Hilty, Nadean Corwin, MD  Budeson-Glycopyrrol-Formoterol (BREZTRI AEROSPHERE) 160-9-4.8 MCG/ACT AERO Inhale 2 puffs into the lungs 2 (two) times daily. 01/17/22   Chesley Mires, MD  fluticasone (FLONASE) 50 MCG/ACT nasal spray Place 1 spray into both nostrils daily. 04/11/21   Chesley Mires, MD  guaiFENesin (MUCINEX) 600 MG 12 hr tablet Take 2 tablets (1,200 mg total) by mouth 2 (two) times daily as needed for cough or to loosen phlegm. 04/11/21   Chesley Mires, MD  hydrALAZINE (APRESOLINE) 10 MG tablet TAKE 1 TABLET(10 MG) BY MOUTH THREE TIMES DAILY 05/23/21   Hilty, Nadean Corwin, MD  levETIRAcetam (KEPPRA) 250 MG tablet 2 tablets twice daily for 3 weeks, then take 1 tablet twice daily for 3 weeks, then stop 06/17/21   Kathrynn Ducking, MD  losartan (COZAAR) 100 MG tablet TAKE 1 TABLET(100 MG) BY MOUTH DAILY 01/13/22   Hilty, Nadean Corwin, MD  Multiple Vitamins-Minerals (MULTIVITAMIN ADULTS PO) Take 1 tablet by mouth daily.    [provider]  phenytoin (DILANTIN) 100 MG ER capsule TAKE 1 CAPSULE(100 MG) BY MOUTH THREE TIMES DAILY. 10/31/21   Suzzanne Cloud, NP  sertraline (ZOLOFT) 50 MG tablet Take 1 tablet (50 mg total) by mouth daily. 05/18/21   Kathrynn Ducking, MD  Spacer/Aero-Holding Dorise Bullion Use with your symbicort and your rescue inhaler 07/08/18   Lauraine Rinne, NP  zonisamide (ZONEGRAN) 100 MG  capsule Take 1 capsule (100 mg total) by mouth 2 (two) times daily. 01/23/22   Suzzanne Cloud, NP     Critical care time: ***

## 2022-03-30 NOTE — ED Notes (Addendum)
EDP and Neurologist at bedside

## 2022-03-30 NOTE — ED Notes (Signed)
Pt on chronic O2 - Keep O2 90% or better per MD Pickering - Pt switched to 5L [place] at this time

## 2022-03-30 NOTE — ED Notes (Signed)
EEG at bedside.

## 2022-03-31 ENCOUNTER — Other Ambulatory Visit: Payer: Self-pay | Admitting: Internal Medicine

## 2022-03-31 ENCOUNTER — Inpatient Hospital Stay (HOSPITAL_COMMUNITY): Payer: PPO

## 2022-03-31 DIAGNOSIS — J9622 Acute and chronic respiratory failure with hypercapnia: Secondary | ICD-10-CM

## 2022-03-31 DIAGNOSIS — J9621 Acute and chronic respiratory failure with hypoxia: Secondary | ICD-10-CM

## 2022-03-31 DIAGNOSIS — R918 Other nonspecific abnormal finding of lung field: Secondary | ICD-10-CM

## 2022-03-31 DIAGNOSIS — G40901 Epilepsy, unspecified, not intractable, with status epilepticus: Secondary | ICD-10-CM | POA: Diagnosis not present

## 2022-03-31 DIAGNOSIS — I619 Nontraumatic intracerebral hemorrhage, unspecified: Secondary | ICD-10-CM | POA: Diagnosis not present

## 2022-03-31 DIAGNOSIS — L8990 Pressure ulcer of unspecified site, unspecified stage: Secondary | ICD-10-CM | POA: Insufficient documentation

## 2022-03-31 LAB — CBC
HCT: 33.5 % — ABNORMAL LOW (ref 39.0–52.0)
Hemoglobin: 10.4 g/dL — ABNORMAL LOW (ref 13.0–17.0)
MCH: 29.3 pg (ref 26.0–34.0)
MCHC: 31 g/dL (ref 30.0–36.0)
MCV: 94.4 fL (ref 80.0–100.0)
Platelets: 372 10*3/uL (ref 150–400)
RBC: 3.55 MIL/uL — ABNORMAL LOW (ref 4.22–5.81)
RDW: 14.4 % (ref 11.5–15.5)
WBC: 17 10*3/uL — ABNORMAL HIGH (ref 4.0–10.5)
nRBC: 0 % (ref 0.0–0.2)

## 2022-03-31 LAB — BASIC METABOLIC PANEL
Anion gap: 8 (ref 5–15)
BUN: 25 mg/dL — ABNORMAL HIGH (ref 8–23)
CO2: 24 mmol/L (ref 22–32)
Calcium: 10.6 mg/dL — ABNORMAL HIGH (ref 8.9–10.3)
Chloride: 103 mmol/L (ref 98–111)
Creatinine, Ser: 0.9 mg/dL (ref 0.61–1.24)
GFR, Estimated: >60 mL/min (ref >60)
Glucose, Bld: 130 mg/dL — ABNORMAL HIGH (ref 70–99)
Potassium: 4.1 mmol/L (ref 3.5–5.1)
Sodium: 135 mmol/L (ref 135–145)

## 2022-03-31 LAB — MRSA NEXT GEN BY PCR, NASAL: MRSA by PCR Next Gen: NOT DETECTED

## 2022-03-31 LAB — MAGNESIUM: Magnesium: 1.8 mg/dL (ref 1.7–2.4)

## 2022-03-31 LAB — PHOSPHORUS: Phosphorus: 5.7 mg/dL — ABNORMAL HIGH (ref 2.5–4.6)

## 2022-03-31 MED ORDER — MORPHINE SULFATE (PF) 2 MG/ML IV SOLN
2.0000 mg | INTRAVENOUS | Status: DC | PRN
  Administered 2022-03-31: 4 mg via INTRAVENOUS
  Administered 2022-03-31 (×2): 2 mg via INTRAVENOUS
  Administered 2022-04-01 (×2): 4 mg via INTRAVENOUS
  Administered 2022-04-01: 2 mg via INTRAVENOUS
  Administered 2022-04-01: 4 mg via INTRAVENOUS
  Filled 2022-03-31 (×2): qty 2
  Filled 2022-03-31 (×2): qty 1
  Filled 2022-03-31: qty 2
  Filled 2022-03-31: qty 1
  Filled 2022-03-31: qty 2
  Filled 2022-03-31: qty 1

## 2022-03-31 MED ORDER — PHENYTOIN SODIUM 50 MG/ML IJ SOLN
100.0000 mg | Freq: Three times a day (TID) | INTRAMUSCULAR | Status: DC
  Administered 2022-03-31 – 2022-04-01 (×6): 100 mg via INTRAVENOUS
  Filled 2022-03-31 (×7): qty 2

## 2022-03-31 MED ORDER — GLYCOPYRROLATE 0.2 MG/ML IJ SOLN
0.2000 mg | INTRAMUSCULAR | Status: DC | PRN
  Administered 2022-04-01 (×2): 0.2 mg via INTRAVENOUS
  Filled 2022-03-31 (×2): qty 1

## 2022-03-31 MED ORDER — PANTOPRAZOLE SODIUM 40 MG IV SOLR
40.0000 mg | INTRAVENOUS | Status: DC
  Administered 2022-03-31: 40 mg via INTRAVENOUS
  Filled 2022-03-31: qty 10

## 2022-03-31 MED ORDER — ORAL CARE MOUTH RINSE: 15.0000 mL | OROMUCOSAL | Status: DC | PRN

## 2022-03-31 MED ORDER — GLYCOPYRROLATE 1 MG PO TABS: 1.0000 mg | ORAL_TABLET | ORAL | Status: DC | PRN

## 2022-03-31 MED ORDER — SODIUM CHLORIDE 0.9 % IV SOLN: 250.0000 mL | INTRAVENOUS | Status: DC

## 2022-03-31 MED ORDER — DEXAMETHASONE SODIUM PHOSPHATE 4 MG/ML IJ SOLN
4.0000 mg | Freq: Four times a day (QID) | INTRAMUSCULAR | Status: DC
  Administered 2022-03-31 (×2): 4 mg via INTRAVENOUS
  Filled 2022-03-31 (×2): qty 1

## 2022-03-31 MED ORDER — LORAZEPAM 2 MG/ML IJ SOLN: 2.0000 mg | Freq: Four times a day (QID) | INTRAMUSCULAR | Status: DC | PRN

## 2022-03-31 MED ORDER — LACTATED RINGERS IV BOLUS
500.0000 mL | Freq: Once | INTRAVENOUS | Status: AC
  Administered 2022-03-31: 500 mL via INTRAVENOUS

## 2022-03-31 MED ORDER — FOSPHENYTOIN SODIUM 500 MG PE/10ML IJ SOLN
20.0000 mg/kg | Freq: Once | INTRAMUSCULAR | Status: AC
  Administered 2022-03-31: 1140 mg via INTRAVENOUS
  Filled 2022-03-31: qty 4

## 2022-03-31 MED ORDER — POLYVINYL ALCOHOL 1.4 % OP SOLN: 1.0000 [drp] | Freq: Four times a day (QID) | OPHTHALMIC | Status: DC | PRN

## 2022-03-31 MED ORDER — ACETAMINOPHEN 325 MG PO TABS: 650.0000 mg | ORAL_TABLET | Freq: Four times a day (QID) | ORAL | Status: DC | PRN

## 2022-03-31 MED ORDER — CHLORHEXIDINE GLUCONATE CLOTH 2 % EX PADS
6.0000 | MEDICATED_PAD | Freq: Every day | CUTANEOUS | Status: DC
Start: 2022-03-31 — End: 2022-04-02
  Administered 2022-03-31 – 2022-04-01 (×2): 6 via TOPICAL

## 2022-03-31 MED ORDER — REVEFENACIN 175 MCG/3ML IN SOLN
175.0000 ug | Freq: Every day | RESPIRATORY_TRACT | Status: DC
  Administered 2022-03-31: 175 ug via RESPIRATORY_TRACT
  Filled 2022-03-31: qty 3

## 2022-03-31 MED ORDER — LACTATED RINGERS IV SOLN: INTRAVENOUS | Status: DC

## 2022-03-31 MED ORDER — SODIUM CHLORIDE 0.9 % IV SOLN
100.0000 mg | Freq: Two times a day (BID) | INTRAVENOUS | Status: DC
  Administered 2022-03-31 – 2022-04-01 (×4): 100 mg via INTRAVENOUS
  Filled 2022-03-31 (×7): qty 10

## 2022-03-31 MED ORDER — ZONISAMIDE 100 MG PO CAPS
100.0000 mg | ORAL_CAPSULE | Freq: Two times a day (BID) | ORAL | Status: DC
  Filled 2022-03-31 (×3): qty 1

## 2022-03-31 MED ORDER — LORAZEPAM 2 MG/ML IJ SOLN
2.0000 mg | INTRAMUSCULAR | Status: DC | PRN
  Administered 2022-04-01 (×2): 4 mg via INTRAVENOUS
  Administered 2022-04-01: 2 mg via INTRAVENOUS
  Filled 2022-03-31: qty 2
  Filled 2022-03-31: qty 1
  Filled 2022-03-31: qty 2

## 2022-03-31 MED ORDER — NOREPINEPHRINE 4 MG/250ML-% IV SOLN
2.0000 ug/min | INTRAVENOUS | Status: DC
  Administered 2022-03-31: 2 ug/min via INTRAVENOUS
  Filled 2022-03-31: qty 250

## 2022-03-31 MED ORDER — GLYCOPYRROLATE 0.2 MG/ML IJ SOLN: 0.2000 mg | INTRAMUSCULAR | Status: DC | PRN

## 2022-03-31 MED ORDER — ARFORMOTEROL TARTRATE 15 MCG/2ML IN NEBU
15.0000 ug | INHALATION_SOLUTION | Freq: Two times a day (BID) | RESPIRATORY_TRACT | Status: DC
  Administered 2022-03-31: 15 ug via RESPIRATORY_TRACT
  Filled 2022-03-31: qty 2

## 2022-03-31 MED ORDER — MORPHINE SULFATE (PF) 2 MG/ML IV SOLN
INTRAVENOUS | Status: AC
  Filled 2022-03-31: qty 1

## 2022-03-31 MED ORDER — BUDESONIDE 0.5 MG/2ML IN SUSP
0.5000 mg | Freq: Two times a day (BID) | RESPIRATORY_TRACT | Status: DC
  Administered 2022-03-31: 0.5 mg via RESPIRATORY_TRACT
  Filled 2022-03-31: qty 2

## 2022-03-31 MED ORDER — ORAL CARE MOUTH RINSE
15.0000 mL | OROMUCOSAL | Status: DC
  Administered 2022-03-31 – 2022-04-01 (×6): 15 mL via OROMUCOSAL

## 2022-03-31 MED ORDER — LORAZEPAM 2 MG/ML IJ SOLN
2.0000 mg | Freq: Once | INTRAMUSCULAR | Status: AC
  Administered 2022-03-31: 2 mg via INTRAVENOUS

## 2022-03-31 MED ORDER — MORPHINE SULFATE (PF) 2 MG/ML IV SOLN
2.0000 mg | Freq: Once | INTRAVENOUS | Status: AC
  Administered 2022-03-31: 2 mg via INTRAVENOUS

## 2022-03-31 MED ORDER — ALBUMIN HUMAN 5 % IV SOLN
25.0000 g | Freq: Once | INTRAVENOUS | Status: AC
  Administered 2022-03-31: 25 g via INTRAVENOUS
  Filled 2022-03-31: qty 500

## 2022-03-31 MED ORDER — ACETAMINOPHEN 650 MG RE SUPP: 650.0000 mg | Freq: Four times a day (QID) | RECTAL | Status: DC | PRN

## 2022-03-31 NOTE — Progress Notes (Signed)
NAME:  Danny Krause, MRN:  [id number], DOB:  [birth year], LOS: 1 ADMISSION DATE:  03/09/2022, CONSULTATION DATE: 7/27 REFERRING MD: Dr. Alvino Chapel EDP, CHIEF COMPLAINT: Seizure  History of Present Illness:  81-y/o M w/history of severe COPD, emphysema, HOT 2L [place], CAD s/p CABG, and seizure disorder w/ previous status epilepticus requiring intubation and tracheostomy in the past, severe deterioration over past  2 months, weight loss, worsening back pain, and failure to thrive.  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  On 7/27 he was noted to be babbling incoherent speech to family members who alerted EMS. He was actively seizing on EMS arrival, abated with 5 mg of Versed.  In ER, he was able to give yes and no answers but was minimally responsive.  CT scan of the head concerning for intracranial hemorrhage and possibly mass.    Pertinent  Medical History   has a past medical history of Anginal pain ([place]), Coronary artery disease, Critical aortic valve stenosis, Dyspnea, Encephalopathy chronic (02/13/2018), Murmur, Seizure ([place]), Stroke ([place]), and Syncope.  Significant Hospital Events: Including procedures, antibiotic start and stop dates in addition to other pertinent events   7/27 admit for status epilepticus, DNR/ DNI   7/23 MRI L spine:  large mass L2- L3 with significant spinal stenosis  7/28 CTA head/ neck:  1.9 x 1.5 x 1.7 cm heterogeneous hyperdensity at the parasagittal left parieto-occipital region. While this finding could reflect a simple hemorrhage, the heterogeneous appearance raises the concern for possible underlying lesion/mass. Lung apices with severe emphesma. LUL mass identified.   Interim History / Subjective:  Started on low dose peripheral NE, 2 mcg, for hypotension not responsive to fluid bolus  Remains on cEEG> was in [place] arising from left hemisphere, maximal left posterior quadrant, resolved around 0200 this  morning,  since evidence of epileptogenicity arising from left hemisphere and maximal left posterior quadrant > high risk for recurrent seizure  On NRB, remains minimally responsive, mostly to pain, lots of upper airway secretions  Objective   Blood pressure (!) 93/53, pulse 62, temperature 98.3 F (36.8 C), temperature source Axillary, resp. rate (!) 23, height 5\' 5"  (1.651 m), weight 57 kg, SpO2 100 %.        Intake/Output Summary (Last 24 hours) at 03/31/2022 5885 Last data filed at 03/31/2022 0900 Gross per 24 hour  Intake 730.07 ml  Output 475 ml  Net 255.07 ml   Filed Weights   03/18/2022 2013  Weight: 57 kg    Examination: General:  elderly frail and ill appearing male in bed HEENT: MM pink/moist> copious oral secretions, pupils 2 pinpoint Neuro:  minimally responsive to pain, will withdrawal some in extremities, not f/c or localize, non verbal, does not open eyes CV: rr, NSR 60's PULM:  cheyne stokes breathing pattern, wet cough, rhonchi R>L, on NRB GI: soft, bs+, ND Extremities: warm/dry, no LE edema  Skin: no rashes   Labs reviewed>  calcium/ phos high, WBC 11.7> 17, Hgb 10.4  Resolved Hospital Problem list     Assessment & Plan:   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 Dilantin level low.  Keppra given in ER.  - remains on cEEG - status broke around 2am - remains on dilantin, zonisamide (unable to give orally), and prn ativan.  Further AED per neurology  - Maintain neuro protective measures; goal for eurothermia, euglycemia, eunatermia, normoxia, and PCO2 goal of 35-40 -  seizure precautions  - Aspirations precautions   ICH: - Keep SBP < 160.  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 - Will need further workup should he improve  Acute on chronic hypoxemic respiratory failure: on 2L at home COPD  without acute exacerbation OSA not on CPAP - obvious he is aspirating.  From discussions with daughter on admit, she said he would not want intubation/ DNR.  Ongoing GOC.  - remains on NRB at this time - HOB 11 - continue scheduled brovana/ pulmicort/ yupelri, albuterol prn   CAD s/p CABG HTN HLD - Holding home atorvastatin, asa, hydralazine   Hypotension - initially unresponsive to fluid bolus and albumin - NE currently at 1 mcg, likely to wean off soon - family ok with peripheral vasopressors for now pending further family arrival   Best Practice (right click and "Reselect all SmartList Selections" daily)   Diet/type: NPO DVT prophylaxis: other GI prophylaxis: PPI Lines: N/A Foley:  N/A Code Status:  DNR Last date of multidisciplinary goals of care discussion [ Discussed with daughter in ED 7/27]  Pending arrival of family for ongoing [place]  Labs   CBC: Recent Labs  Lab 03/20/2022 2035 03/31/22 0356  WBC 11.7* 17.0*  NEUTROABS 9.6*  --   HGB 11.1* 10.4*  HCT 34.1* 33.5*  MCV 90.9 94.4  PLT 377 275    Basic Metabolic Panel: Recent Labs  Lab 04/02/2022 2035 03/31/22 0356  NA 135 135  K 3.5 4.1  CL 104 103  CO2 22 24  GLUCOSE 91 130*  BUN 26* 25*  CREATININE 0.72 0.90  CALCIUM 10.9* 10.6*  MG  --  1.8  PHOS  --  5.7*   GFR: Estimated Creatinine Clearance: 51.9 mL/min (by C-G formula based on SCr of 0.9 mg/dL). Recent Labs  Lab 03/07/2022 2035 03/31/22 0356  WBC 11.7* 17.0*    Liver Function Tests: Recent Labs  Lab 03/20/2022 2035  AST 32  ALT 22  ALKPHOS 128*  BILITOT 0.6  PROT 6.3*  ALBUMIN 2.6*   No results for input(s): "LIPASE", "AMYLASE" in the last 168 hours. No results for input(s): "AMMONIA" in the last 168 hours.  ABG    Component Value Date/Time   PHART 7.411 03/27/2018 1510   PCO2ART 38.9 03/27/2018 1510   PO2ART 70.6 (L) 03/27/2018 1510   HCO3 24.2 03/27/2018 1510   TCO2 22 03/27/2018 0247   ACIDBASEDEF 2.3 (H) 02/12/2018 1022    O2SAT 94.4 03/27/2018 1510     Coagulation Profile: No results for input(s): "INR", "PROTIME" in the last 168 hours.  Cardiac Enzymes: No results for input(s): "CKTOTAL", "CKMB", "CKMBINDEX", "TROPONINI" in the last 168 hours.  HbA1C: Hgb A1c MFr Bld  Date/Time Value Ref Range Status  02/13/2018 06:23 AM 5.9 (H) 4.8 - 5.6 % Final    Comment:    (NOTE) Pre diabetes:          5.7%-6.4% Diabetes:              >6.4% Glycemic control for   <7.0% adults with diabetes   02/07/2018 01:40 PM 5.9 (H) 4.8 - 5.6 % Final    Comment:    (NOTE) Pre diabetes:          5.7%-6.4% Diabetes:              >6.4% Glycemic control for   <  7.0% adults with diabetes     CBG: No results for input(s): "GLUCAP" in the last 168 hours.   Critical care time: 30 minutes      Kennieth Rad, MSN, AG-ACNP-BC [facility] 03/31/2022, 9:26 AM  See Amion for pager If no response to pager, please call PCCM consult pager After 7:00 pm call Elink

## 2022-03-31 NOTE — Procedures (Addendum)
Patient Name: Danny Krause  MRN: [id number]  Epilepsy Attending: Lora Havens  Referring Physician/Provider: Kerney Elbe, MD Date: 03/27/2022 Duration: 25.07 mins  Patient history: 81 year old male with a history of seizures presenting with focal status epilepticus.  EEG to evaluate for seizure  Level of alertness:  lethargic   AEDs during EEG study: LEV, PHT, ZNS  Technical aspects: This EEG study was done with scalp electrodes positioned according to the 10-20 International system of electrode placement. Electrical activity was reviewed with band pass filter of 1-70Hz , sensitivity of 7 uV/mm, display speed of 46mm/sec with a 60Hz  notched filter applied as appropriate. EEG data were recorded continuously and digitally stored.  Video monitoring was available and reviewed as appropriate.  Description: EEG showed sharply contoured 3 to 5 Hz delta slowing admixed with spikes in left hemisphere, maximal left posterior quadrant with evolution in morphology and frequency.  No clinical signs were seen. This EEG pattern is consistent with electrographic status epilepticus arising from left hemisphere, maximal left posterior quadrant. There is also continuous generalized 3-5Hz  theta-delta slowing.  Hyperventilation and photic stimulation were not performed.     ABNORMALITY -Electrographic status epilepticus, left hemisphere, maximal left posterior quadrant - Continuous slow, generalized  IMPRESSION: This study showed evidence of electrographic status epilepticus arising from left hemisphere, maximal left posterior quadrant as well as severe diffuse encephalopathy likely related to seizures.  Dr Cheral Marker was notified at 1201 am on 03/31/2022.  Danette Weinfeld Barbra Sarks

## 2022-03-31 NOTE — Progress Notes (Signed)
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 On 7/27 he was noted to be babbling incoherent speech to family members who alerted EMS.    CT Brain/Chest: -Negative CTA for large vessel occlusion  -8.0 x 4.4 cm left upper lobe mass, suspected primary bronchogenic carcinoma.  -previously identified hyperdense lesion at the left parieto-occipital region concerning for possible metastatic lesion.  eICU Interventions  Status epilepticus:  - Keppra given in ED/ Dilantin level was low  - prn Seizure control. No drips if possible  - EEG not likely to change management  - spoke to family at bedside their primary concern is pain control/comfort- I agree.  - Dexamethasone for CA related mets/spinal pain/vasogenic edema possibly worsening seizures.    ICH: - Keep SBP < 160 - Not a surgical candidate    Metastatic cancer: supportive care    Acute on chronic hypoxemic respiratory failure: on 2L at home COPD without acute exacerbation OSA not on CPAP - Supplemental O2 to keep sats 90-95% - DNI per daughter/HCPOA - prn nebs         Kaely Hollan N Joshoa Shawler 03/31/2022, 2:18 AM

## 2022-03-31 NOTE — Progress Notes (Signed)
LTM EEG discontinued - no skin breakdown at unhook.   

## 2022-03-31 NOTE — Progress Notes (Signed)
LTM maint complete - no skin breakdown  Transported to 4N. All leads attached.

## 2022-03-31 NOTE — Procedures (Addendum)
Patient Name: Danny Krause  MRN: [id number]  Epilepsy Attending: Lora Havens  Referring Physician/Provider: Kerney Elbe, MD Duration: 03/31/2022 0003 to 03/31/2022 1543  Patient history: 81 year old male with a history of seizures presenting with focal status epilepticus.  EEG to evaluate for seizure   Level of alertness:  lethargic    AEDs during EEG study: LEV, PHT, ZNS   Technical aspects: This EEG study was done with scalp electrodes positioned according to the 10-20 International system of electrode placement. Electrical activity was reviewed with band pass filter of 1-70Hz , sensitivity of 7 uV/mm, display speed of 74mm/sec with a 60Hz  notched filter applied as appropriate. EEG data were recorded continuously and digitally stored.  Video monitoring was available and reviewed as appropriate.   Description: EEG showed sharply contoured 3 to 5 Hz delta slowing admixed with spikes in left hemisphere, maximal left posterior quadrant with evolution in morphology and frequency.  No clinical signs were seen. This EEG pattern was consistent with electrographic status epilepticus arising from left hemisphere, maximal left posterior quadrant. As medications were adjusted, status epilepticus resolved at around 0200 on 03/31/2022.  After this, EEG showed continuous generalized low amplitude 3 to 6 Hz theta with delta slowing. Lateralized periodic discharges with overriding fast activity at 0.5-1 Hz were noted in left hemisphere, maximal left posterior quadrant which at times appeared more rhythmic and consistent with brief ictal-interictal rhythmic discharges.  Hyperventilation and photic stimulation were not performed.      ABNORMALITY - Electrographic status epilepticus, left hemisphere, maximal left posterior quadrant -Lateralized periodic discharges with overriding fast activity, left hemisphere, maximal left posterior quadrant -Brief ictal-interictal rhythmic discharges, left hemisphere,  maximal left posterior quadrant - Continuous slow, generalized   IMPRESSION: This study initially showed electrographic status epilepticus arising from left hemisphere, maximal left posterior quadrant which resolved at around 0200 on 03/31/2022. After this, EEG showed evidence of epileptogenicity arising from left hemisphere, maximal left posterior quadrant which is on the ictal-interictal continuum with high likelihood of seizure recurrence. Additionally there is severe diffuse encephalopathy, most likely due to seizure.   Davontae Prusinski Barbra Sarks

## 2022-03-31 NOTE — Progress Notes (Addendum)
Subjective: No clinical seizures overnight. Patient's daughter at bedside states patient has been in coma for over 2 weeks, intubated and had tracheostomy in the past. He very clearly stated he would not want to be intubated again.   ROS: Unable to obtain due to poor mental status  Examination  Vital signs in last 24 hours: Temp:  [97.1 F (36.2 C)-98.5 F (36.9 C)] 98.3 F (36.8 C) (07/28 0800) Pulse Rate:  [62-111] 62 (07/28 0900) Resp:  [10-29] 23 (07/28 0900) BP: (66-146)/(40-77) 93/53 (07/28 0900) SpO2:  [92 %-100 %] 100 % (07/28 0900) Weight:  [57 kg] 57 kg (07/27 2013)  General: lying in bed, NAD Neuro: Lethargic, does not open eyes to noxious stimuli, does not follow commands, pupils appear small and difficult to appreciate any reactivity, no forced gaze deviation, spontaneously moving left upper and lower extremity with antigravity strength, minimal movement in right lower extremity, unable to assess right upper extremity as patient was running on the right side with right arm underneath him and daughter is considering comfort care so I avoided noxious stimulation  Basic Metabolic Panel: Recent Labs  Lab 03/19/2022 2035 03/31/22 0356  NA 135 135  K 3.5 4.1  CL 104 103  CO2 22 24  GLUCOSE 91 130*  BUN 26* 25*  CREATININE 0.72 0.90  CALCIUM 10.9* 10.6*  MG  --  1.8  PHOS  --  5.7*    CBC: Recent Labs  Lab 03/19/2022 2035 03/31/22 0356  WBC 11.7* 17.0*  NEUTROABS 9.6*  --   HGB 11.1* 10.4*  HCT 34.1* 33.5*  MCV 90.9 94.4  PLT 377 372   Coagulation Studies: No results for input(s): "LABPROT", "INR" in the last 72 hours.  Imaging CT head without contrast 03/13/2022: 1. 1.9 x 1.5 x 1.7 cm heterogeneous hyperdensity at the parasagittal left parieto-occipital region. While this finding could reflect a simple hemorrhage, the heterogeneous appearance raises the concern for possible underlying lesion/mass. Further evaluation with dedicated brain MRI, with and without  contrast recommended. No significant surrounding edema or mass effect. 2. Underlying atrophy with chronic small vessel ischemic disease with small remote right frontal infarct.  CT angio head and neck with and without contrast 03/13/2022: 1. Negative CTA for large vessel occlusion or other emergent finding. 2. Mild-to-moderate atheromatous disease, but no hemodynamically significant or correctable stenosis. 3. 8.0 x 4.4 cm left upper lobe mass, partially visualized, and concerning for a primary bronchogenic carcinoma. Further evaluation with dedicated cross-sectional imaging of the chest recommended. Given this finding, the previously identified hyperdense lesion at the left parieto-occipital region is most concerning for a possible metastatic lesion. 4. Aortic Atherosclerosis (ICD10-I70.0) and Emphysema (ICD10-J43.9).   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  Status epilepticus, resolved Unknown primary with metastatic disease to brain -LTM EEG continues to evidence of epileptogenicity from left hemisphere, maximal left posterior quadrant with increased risk of seizure recurrence.  Recommendations -Continue phenytoin 100 mg every 8 hours and zonisamide 100 mg twice daily -Patient has had side effects (irritability) on Keppra therefore avoiding but this can be used if absolutely needed -Discussed with the daughter about goals of care. Daughter states she is waiting for patient brother to arrive but family has been thinking about comfort care. Daughter states patient has been in excruciating pain every day and family understands that his metastatic disease is not curable. Patient has also clearly  stated that he would not want to be intubated. -We will avoid making any medication changes to her family is able to decide more on goals of  care -If family opts for comfort care, will discontinue LTM EEG -However, if family would like to continue care, if seizures recur will load with Vimpat 200mg   -Continue seizure precautions -PRN IV Ativan 2 mg for clinical seizure-like activity -Management of rest of comorbidities per primary team  ADDENDUM -Unable to administer zonisamide due to lack of p.o. access.  Will switch to Vimpat 100 mg twice daily -If further seizures on EEG, can consider adding perampanel 8 mg once  I have spent a total of  38  minutes with the patient reviewing hospital notes,  test results, labs and examining the patient as well as establishing an assessment and plan that was discussed personally with the patient's daughter at bedside.  > 50% of time was spent in direct patient care.    Zeb Comfort Epilepsy [facility] For questions after 5pm please refer to AMION to reach the Neurologist on call

## 2022-03-31 NOTE — Progress Notes (Signed)
LTM maint complete - no skin breakdown under: CZ/P4.

## 2022-03-31 NOTE — Progress Notes (Signed)
LTM EEG hooked up and running - no initial skin breakdown - push button tested - neuro notified. [facility] monitoring.  

## 2022-03-31 NOTE — ACP (Advance Care Planning) (Signed)
  Interdisciplinary Goals of Care Family Meeting   Date carried out: 03/31/2022  Location of the meeting: Bedside  Member's involved: Physician, Family Member or next of kin, and Other: Nurse practitioner  Durable Power of Attorney or acting medical decision maker: [name], patient's daughter.  Discussion: We discussed goals of care for Danny Krause .  Patient has been declining health with pain from now newly diagnosed spine and lung masses likely representing primary lung malignancy.  Family acknowledges that the patient would not likely recover to acceptable quality health after critical illness and overriding wish is for the patient to be comfortable.  Code status: Full DNR  Disposition: Continue current acute care  Time spent for the meeting: [place], MD  03/31/2022, 6:47 AM

## 2022-03-31 NOTE — Progress Notes (Signed)
An USGPIV (ultrasound guided PIV) has been placed for short-term vasopressor infusion. A correctly placed ivWatch must be used when administering Vasopressors. Should this treatment be needed beyond 72 hours, central line access should be obtained.  It will be the responsibility of the bedside nurse to follow best practice to prevent extravasations.   ?

## 2022-03-31 NOTE — ED Notes (Signed)
EEG tech to come and help transfer pt upstairs

## 2022-03-31 NOTE — Consult Note (Incomplete)
NEURO HOSPITALIST CONSULT NOTE   Requestig physician: Dr. Alvino Chapel  Reason for Consult: Focal status epilepticus  History obtained from:  Daughter and Chart     HPI:                                                                                                                                          LISA BLAKEMAN is an 81 y.o. 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 LKN was 11:00 PM yesterday when he went to bed. At his baseline he is conversant but with RLE weakness thought to be secondary to recently diagnosed lumbar spinal tumor by imaging. On awakening this AM he had worsened RLE weakness. Later today, daughter checked on him and initially he was speaking normally, but then he suddenly became aphasic with garbled, nonsensical speech. He could follow some simple instructions but speech continued to be garbled. EMS was called and on their arrival then noted focal right sided seizure activity with rightward gaze and bilateral nystagmus. He was administered 5 mg IM Versed which stopped the twitching. VS with EMS: 142/78, HR 100 with first degree block, capnography 23, CBG 86.   On arrival to the ED his speech remained incomprehensible. He gradually became more sedated, felt likely to be due to delayed effect of the Versed with occasional short incomprehensible utterances. Some intermittent spasmodic right foot twitching was noted by EDP, but no other right sided movement was seen. The right sided weakness was thought to be due to a stroke versus Todd's paralysis. Neurology was called to evaluate the patient STAT and during the  assessment decision was made to call a Code Stroke to assess for possible LVO.   Per his daughter, he has had about 5-6 total seizures since his initial seizure in 2019 which had presented as a focal onset seizure evolving to generalized status epilepticus and required inpatient admission here at [facility]. He has had about 1 seizure per year since then. His typical seizures are focal in onset, followed by secondary generalization and lasting for a total of about 1 minute.   Past Medical History:  Diagnosis Date  . Anginal pain ([place])    with exertion  . Coronary artery disease   . Critical aortic valve stenosis   . Dyspnea    with exertion  . 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 Murmur   . Seizure ([place])   . Stroke ([place])   . Syncope  Past Surgical History:  Procedure Laterality Date  . AORTIC VALVE REPLACEMENT N/A 07/04/2017   Procedure: AORTIC VALVE REPLACEMENT (AVR);  Surgeon: Grace Isaac, MD;  Location: [place];  Service: Open Heart Surgery;  Laterality: N/A;  . CORONARY ARTERY BYPASS GRAFT N/A 07/04/2017   Procedure: CORONARY ARTERY BYPASS GRAFTING (CABG) x three, using left internal mammary artery and right leg greater saphenous vein harvested endoscopically;  Surgeon: Grace Isaac, MD;  Location: [place];  Service: Open Heart Surgery;  Laterality: N/A;  . ESOPHAGOGASTRODUODENOSCOPY (EGD) WITH PROPOFOL N/A 03/02/2020   Procedure: ESOPHAGOGASTRODUODENOSCOPY (EGD) WITH PROPOFOL;  Surgeon: Carol Ada, MD;  Location: [facility];  Service: Endoscopy;  Laterality: N/A;  Venia Minks DILATION  03/02/2020   Procedure: Venia Minks DILATION;  Surgeon: Carol Ada, MD;  Location: [facility];  Service: Endoscopy;;  . NO PAST SURGERIES    . RIGHT/LEFT HEART CATH AND CORONARY ANGIOGRAPHY N/A 06/25/2017   Procedure: RIGHT/LEFT HEART CATH AND CORONARY ANGIOGRAPHY;  Surgeon: Troy Sine, MD;  Location: [place] [facility];  Service: Cardiovascular;  Laterality: N/A;  . TEE WITHOUT  CARDIOVERSION N/A 07/04/2017   Procedure: TRANSESOPHAGEAL ECHOCARDIOGRAM (TEE);  Surgeon: Grace Isaac, MD;  Location: [place];  Service: Open Heart Surgery;  Laterality: N/A;  . THORACIC AORTOGRAM  06/25/2017   Procedure: THORACIC AORTOGRAM;  Surgeon: Troy Sine, MD;  Location: [place] [facility];  Service: Cardiovascular;;  aortic root   . TRACHEOSTOMY CLOSURE  02/2018    Family History  Problem Relation Age of Onset  . Hernia Father   . Stroke Brother             Social History:  reports that he quit smoking about 4 years ago. His smoking use included cigarettes. He has a 128.00 pack-year smoking history. He has never used smokeless tobacco. He reports that he does not drink alcohol and does not use drugs.  No Known Allergies  HOME MEDICATIONS:                                                                                                                      No current facility-administered medications on file prior to encounter.   Current Outpatient Medications on File Prior to Encounter  Medication Sig Dispense Refill  . acetaminophen (TYLENOL) 325 MG tablet Take 325 mg by mouth every 6 (six) hours as needed for mild pain, fever or headache.     . albuterol (VENTOLIN HFA) 108 (90 Base) MCG/ACT inhaler INHALE 1 TO 2 PUFFS INTO THE LUNGS EVERY 6 HOURS AS NEEDED FOR WHEEZING OR SHORTNESS OF BREATH 8.5 g 5  . aspirin 81 MG EC tablet Take 81 mg by mouth daily.     Marland Kitchen atorvastatin (LIPITOR) 40 MG tablet TAKE 1 TABLET BY MOUTH EVERY DAY 90 tablet 0  . Budeson-Glycopyrrol-Formoterol (BREZTRI AEROSPHERE) 160-9-4.8 MCG/ACT AERO Inhale 2 puffs into the lungs 2 (two) times daily. 10.7 g 11  . fluticasone (FLONASE) 50 MCG/ACT nasal spray Place 1 spray  into both nostrils daily. 16 g 2  . guaiFENesin (MUCINEX) 600 MG 12 hr tablet Take 2 tablets (1,200 mg total) by mouth 2 (two) times daily as needed for cough or to loosen phlegm.    . hydrALAZINE (APRESOLINE) 10 MG tablet TAKE 1  TABLET(10 MG) BY MOUTH THREE TIMES DAILY 270 tablet 3  . levETIRAcetam (KEPPRA) 250 MG tablet 2 tablets twice daily for 3 weeks, then take 1 tablet twice daily for 3 weeks, then stop 126 tablet 0  . losartan (COZAAR) 100 MG tablet TAKE 1 TABLET(100 MG) BY MOUTH DAILY 60 tablet 0  . Multiple Vitamins-Minerals (MULTIVITAMIN ADULTS PO) Take 1 tablet by mouth daily.    . phenytoin (DILANTIN) 100 MG ER capsule TAKE 1 CAPSULE(100 MG) BY MOUTH THREE TIMES DAILY. 270 capsule 3  . sertraline (ZOLOFT) 50 MG tablet Take 1 tablet (50 mg total) by mouth daily. 90 tablet 3  . Spacer/Aero-Holding Dorise Bullion Use with your symbicort and your rescue inhaler 1 each 0  . zonisamide (ZONEGRAN) 100 MG capsule Take 1 capsule (100 mg total) by mouth 2 (two) times daily. 180 capsule 1  Note: Not on Keppra currently.    ROS:                                                                                                                                       Unable to obtain due to lethargy.    Blood pressure (!) 146/72, pulse 82, temperature 98.5 F (36.9 C), temperature source Rectal, resp. rate 15, height 5\' 5"  (1.651 m), weight 57 kg, SpO2 100 %.   General Examination:                                                                                                       Physical Exam  HEENT-  [place]/AT    Lungs- Respirations unlabored Extremities- No edema    Neurological Examination Mental Status: Lethargic. Eyes are closed to half-closed for most of the evaluation. Occasional garbled utterances which contain no comprehensible speech content. Does not follow any commands.  Cranial Nerves: II: No blink to threat. PERRL.   III,IV, VI: Eyes are conjugate near the midline without nystagmus or forced deviation at the time of the assessment. Does not track. Weak doll's eye reflex is present.  V: Closes eyes to eyelid stimulation bilaterally.  VII: Does not grimace to noxious or smile on command. Equivocal right  facial droop at rest.  VIII: Alerts to daughter's voice  IX,X: Deferred gag reflex XI: Head preferentially rotated and laterally flexed to the right.  XII: Does not follow commands for testing.  Motor/Sensory: RUE 0/5 proximally and distally with flaccid tone. No jerking/twitching noted.  RLE 0/5 proximally and distally with flaccid tone. Rhythmic foot twitching was noted by EDP that had subsided by the time of neurology arrival.  LUE and LLE with weak spontaneous movements but no antigravity movement or withdrawal to repeated light noxious stimuli. Decreased tone to LUE and LLE but not as severe as on the right.   Does not respond to right sided noxious stimuli Deep Tendon Reflexes: 1+ bilateral brachioradialis and patellae. 1+ right achilles, 0 left achilles. Toes mute bilaterally  Cerebellar/Gait: Unable to assess    Lab Results: Basic Metabolic Panel: No results for input(s): "NA", "K", "CL", "CO2", "GLUCOSE", "BUN", "CREATININE", "CALCIUM", "MG", "PHOS" in the last 168 hours.  CBC: No results for input(s): "WBC", "NEUTROABS", "HGB", "HCT", "MCV", "PLT" in the last 168 hours.  Cardiac Enzymes: No results for input(s): "CKTOTAL", "CKMB", "CKMBINDEX", "TROPONINI" in the last 168 hours.  Lipid Panel: No results for input(s): "CHOL", "TRIG", "HDL", "CHOLHDL", "VLDL", "LDLCALC" in the last 168 hours.  Imaging: No results found.   Assessment: 81 year old male with a history of seizures presenting with focal status epilepticus. He received 5 mg Versed in the field and 2 mg IV Ativan prior to Neurology assessment.  1.    Recommendations: 1.   STAT Keppra load of 2000 mg IV has been ordered STAT Dilantin level is pending EEG *** STAT CT head Continue Dilantin using IV formulation at 100 mg TID (ordered) Continue Zonegran at 100 mg BID. Will need to administer via NGT   Addendum: - STAT EEG reveals focal electrographic status epilepticus with seizures arising from the left  hemisphere - Switching EEG to LTM - Loading with supplemental dose of fosphenytoin 20 mgPE/kg.    Electronically signed: Dr. Kerney Elbe 03/29/2022, 8:51 PM

## 2022-03-31 NOTE — IPAL (Signed)
  Interdisciplinary Goals of Care Family Meeting   Date carried out: 03/31/2022  Location of the meeting: Bedside  Member's involved: Nurse Practitioner, Bedside Registered Nurse, and Family Member or next of kin  Durable Power of Attorney or acting medical decision maker: Danny Krause (daughter)     Discussion: We discussed goals of care for [name] .  Daughter, Danny Krause at bedside.  Updated on current status, airway concerns, and to clarify future GOC.  Danny Krause tells me of his recent decline over the last 2 weeks and recent lumbar findings of tumors on Monday and now events this admit.  She expresses "just want him to be comfortable".  We had further discussions on plan of care and options to continue current care, including peripheral vasopressors prn, to see if he will wake up, vs full transition to comfort care.  She reiterates no intubation/ CPR.  She would like to wait till her brother, Danny Krause arrives later to make full decision as they both share healthcare decisions.  She is hopeful he will be able to wake up and converse with them but fully aware the seizures may return.  At this time, he appears comfortable and in no obvious pain.     Code status: DNR/ DNI, ok with peripheral pressors for now pending further family arrival.   Disposition: Continue current acute care  Time spent for the meeting: 15 mins      Kennieth Rad, MSN, AG-ACNP-BC [facility] 03/31/2022, 10:39 AM  See Amion for pager If no response to pager, please call PCCM consult pager After 7:00 pm call Elink

## 2022-03-31 NOTE — Progress Notes (Signed)
After family discussion they have decided to go comfort and prioritize pain management. 2mg  IV morphine given while comfort orders are being placed. Family members at the bedside and visitation open at this point. Ica Daye, Rande Brunt, RN

## 2022-03-31 NOTE — Progress Notes (Signed)
Nutrition Brief Note  Chart reviewed. Triggered for assessment due to MST score 2 or greater. Pt now transitioning to comfort care.  No nutrition interventions planned at this time.  Please re-consult as needed.   Kerman Passey MS, RDN, LDN, CNSC Registered Dietitian 3 Clinical Nutrition RD Pager and On-Call Pager Number Located in [place]

## 2022-04-01 DIAGNOSIS — R918 Other nonspecific abnormal finding of lung field: Secondary | ICD-10-CM | POA: Diagnosis not present

## 2022-04-01 DIAGNOSIS — I619 Nontraumatic intracerebral hemorrhage, unspecified: Secondary | ICD-10-CM | POA: Diagnosis not present

## 2022-04-01 DIAGNOSIS — G40901 Epilepsy, unspecified, not intractable, with status epilepticus: Secondary | ICD-10-CM | POA: Diagnosis not present

## 2022-04-01 MED ORDER — SCOPOLAMINE 1 MG/3DAYS TD PT72
1.0000 | MEDICATED_PATCH | TRANSDERMAL | Status: DC
  Administered 2022-04-01: 1.5 mg via TRANSDERMAL
  Filled 2022-04-01: qty 1

## 2022-04-01 MED ORDER — MORPHINE 100MG IN NS 100ML (1MG/ML) PREMIX INFUSION
1.0000 mg/h | INTRAVENOUS | Status: DC
  Administered 2022-04-01: 1 mg/h via INTRAVENOUS
  Filled 2022-04-01: qty 100

## 2022-04-02 NOTE — Progress Notes (Signed)
Pt has expired. Two nurse verification. Family at bedside. Postmortem flow sheet complete.

## 2022-04-04 NOTE — Death Summary Note (Signed)
DEATH SUMMARY   Patient Details  Name: Danny Krause MRN: [id number] DOB: 10/27/1940 PCP:Pcp, No Admission/Discharge Information   Admit Date:  Apr 15, 2022  Date of Death: Date of Death: 04/17/22  Time of Death: Time of Death: 11-28-38  Length of Stay: 2   Principle Cause of death: Metastatic lumbar spinal tumor  Hospital Diagnoses:  Metastatic lumbar spinal tumor, newly diagnosed   Status epilepticus  Intracranial hemorrhage Left upper lobe lung bronchogenic cancer, newly diagnosed Acute on chronic respiratory failure with hypoxia   Pressure injury of skin   Hospital Course: 81 year old male with severe COPD, chronic respiratory failure with hypoxia on home O2, CAD status post CABG, seizure disorder, newly diagnosed lumbar spinal tumor possibly metastatic with unknown primary, critical aortic valve stenosis status post AVR, CVA presented to ED via EMS after daughter noted him to be acutely aphasic and right-sided weakness.  Over the last 2 months, had significant deterioration in his functional capacity, lost appetite and has lost weight.  Due to worsening back pain and overall deterioration, he was sent for MRI of his lumbar spine on 7/23 which had shown large tumor centered within L2-L3 with associated spinal canal stenosis. On admission, he was noted to be incoherent speech by family members.  Upon EMS arrival patient was actively seizing which abated with 5 mg of Versed, subsequently became minimally responsive.  CT head was concerning for intracranial hemorrhage and possibly mass.  He was admitted by critical care to ICU. 7/28: Patient was transitioned to full comfort care per family's request.  Patient was transferred to palliative floor   Assessment and Plan:  Status epilepticus, history of seizure disorder -Witnessed seizures on admission -Received Keppra in ED, neurology was consulted. -CT head showed 1.9x 1.5x 1.7 cm heterogenous density at the parasagittal left  parietal occipital o region, possibly hemorrhage versus mass.  Stat CTA head and neck was negative for large vessel occlusion or emergent findings. -CTA chest showed 8x 4.4 cm left upper lobe mass concerning for primary bronchogenic carcinoma  -EEG revealed focal electrographic status epilepticus with seizures arising from left hemisphere -Status epilepticus resolved, patient was on Dilantin, zonisamide and as needed Ativan -Overall poor prognosis with newly diagnosed lumbar spinal tumor, metastatic from left upper lobe lung CA found on the CT chest -Goals of care were discussed with patient and family by CCM and patient was  transitioned to full comfort care.     ICH -Patient was placed on full comfort care status   Metastatic CA, unknown primary but masses identified in the left upper lobe, lumbar spine and brain, hence likely newly diagnosed left upper lobe bronchogenic CA -Patient was placed on full comfort care   Acute on chronic respiratory failure with hypoxia on 2 L at home COPD, OSA not on CPAP -Patient noted to be aspirating, GOC discussions were held with the family and patient was placed on DNR, full comfort care per family's request     Hypotension -Borderline BP, patient was placed on comfort care status  Pressure injury, stage II buttocks, POA  Patient passed on 04-17-2022 at 2240 (10:40 PM).       Procedures: EEG  Consultations: Patient was admitted by critical care Neurology  The results of significant diagnostics from this hospitalization (including imaging, microbiology, ancillary and laboratory) are listed below for reference.   Significant Diagnostic Studies: Overnight EEG with video  Result Date: 03/31/2022 Lora Havens, MD     03/31/2022  3:52 PM Patient Name: Danny Cerveny  Krause MRN: [id number] Epilepsy Attending: Lora Havens Referring Physician/Provider: Kerney Elbe, MD Duration: 03/31/2022 0003 to 03/31/2022 1543 Patient history: 81 year old male  with a history of seizures presenting with focal status epilepticus.  EEG to evaluate for seizure  Level of alertness:  lethargic  AEDs during EEG study: LEV, PHT, ZNS  Technical aspects: This EEG study was done with scalp electrodes positioned according to the 10-20 International system of electrode placement. Electrical activity was reviewed with band pass filter of 1-70Hz , sensitivity of 7 uV/mm, display speed of 50mm/sec with a 60Hz  notched filter applied as appropriate. EEG data were recorded continuously and digitally stored.  Video monitoring was available and reviewed as appropriate.  Description: EEG showed sharply contoured 3 to 5 Hz delta slowing admixed with spikes in left hemisphere, maximal left posterior quadrant with evolution in morphology and frequency.  No clinical signs were seen. This EEG pattern was consistent with electrographic status epilepticus arising from left hemisphere, maximal left posterior quadrant. As medications were adjusted, status epilepticus resolved at around 0200 on 03/31/2022.  After this, EEG showed continuous generalized low amplitude 3 to 6 Hz theta with delta slowing. Lateralized periodic discharges with overriding fast activity at 0.5-1 Hz were noted in left hemisphere, maximal left posterior quadrant which at times appeared more rhythmic and consistent with brief ictal-interictal rhythmic discharges.  Hyperventilation and photic stimulation were not performed.    ABNORMALITY - Electrographic status epilepticus, left hemisphere, maximal left posterior quadrant -Lateralized periodic discharges with overriding fast activity, left hemisphere, maximal left posterior quadrant -Brief ictal-interictal rhythmic discharges, left hemisphere, maximal left posterior quadrant - Continuous slow, generalized  IMPRESSION: This study initially showed electrographic status epilepticus arising from left hemisphere, maximal left posterior quadrant which resolved at around 0200 on 03/31/2022.  After this, EEG showed evidence of epileptogenicity arising from left hemisphere, maximal left posterior quadrant which is on the ictal-interictal continuum with high likelihood of seizure recurrence. Additionally there is severe diffuse encephalopathy, most likely due to seizure.  Lora Havens    EEG adult  Result Date: 03/31/2022 Lora Havens, MD     03/31/2022  9:10 AM Patient Name: Danny Krause MRN: [id number] Epilepsy Attending: Lora Havens Referring Physician/Provider: Kerney Elbe, MD Date: 03/22/2022 Duration: 25.07 mins Patient history: 82 year old male with a history of seizures presenting with focal status epilepticus.  EEG to evaluate for seizure Level of alertness:  lethargic AEDs during EEG study: LEV, PHT, ZNS Technical aspects: This EEG study was done with scalp electrodes positioned according to the 10-20 International system of electrode placement. Electrical activity was reviewed with band pass filter of 1-70Hz , sensitivity of 7 uV/mm, display speed of 65mm/sec with a 60Hz  notched filter applied as appropriate. EEG data were recorded continuously and digitally stored.  Video monitoring was available and reviewed as appropriate. Description: EEG showed sharply contoured 3 to 5 Hz delta slowing admixed with spikes in left hemisphere, maximal left posterior quadrant with evolution in morphology and frequency.  No clinical signs were seen. This EEG pattern is consistent with electrographic status epilepticus arising from left hemisphere, maximal left posterior quadrant. There is also continuous generalized 3-5Hz  theta-delta slowing.  Hyperventilation and photic stimulation were not performed.   ABNORMALITY -Electrographic status epilepticus, left hemisphere, maximal left posterior quadrant - Continuous slow, generalized IMPRESSION: This study showed evidence of electrographic status epilepticus arising from left hemisphere, maximal left posterior quadrant as well as severe diffuse  encephalopathy likely related to seizures. Dr Cheral Marker  was notified at 1201 am on 03/31/2022. Priyanka Barbra Sarks   CT ANGIO HEAD NECK W WO CM (CODE STROKE)  Result Date: 03/14/2022 CLINICAL DATA:  Initial evaluation for acute neuro deficit, stroke suspected. EXAM: CT ANGIOGRAPHY HEAD AND NECK TECHNIQUE: Multidetector CT imaging of the head and neck was performed using the standard protocol during bolus administration of intravenous contrast. Multiplanar CT image reconstructions and MIPs were obtained to evaluate the vascular anatomy. Carotid stenosis measurements (when applicable) are obtained utilizing NASCET criteria, using the distal internal carotid diameter as the denominator. RADIATION DOSE REDUCTION: This exam was performed according to the departmental dose-optimization program which includes automated exposure control, adjustment of the mA and/or kV according to patient size and/or use of iterative reconstruction technique. CONTRAST:  8mL OMNIPAQUE IOHEXOL 350 MG/ML SOLN COMPARISON:  Prior CT from earlier the same day. FINDINGS: CTA NECK FINDINGS Aortic arch: Visualized aortic arch normal in caliber with normal branch pattern. Moderate atheromatous change about the arch itself. No high-grade stenosis about the origin the great vessels. Right carotid system: Right common and internal carotid arteries patent without dissection or other acute finding. Mild atheromatous disease about the right carotid bulb without stenosis. Left carotid system: Left common and internal carotid arteries patent without stenosis or dissection. Mild atheromatous disease about the left carotid bulb without significant stenosis. Vertebral arteries: Both vertebral arteries arise from subclavian arteries. No significant proximal subclavian artery stenosis. Left vertebral artery dominant. Vertebral arteries patent without stenosis or dissection. Skeleton: No discrete or worrisome osseous lesions. Reversal of the normal cervical  lordosis with moderate spondylosis at C3-4 through C7-T1. Patient is edentulous. Median sternotomy wires noted. Other neck: No other acute soft tissue abnormality within the neck. Upper chest: Severe emphysema. Pleural based soft tissue mass measuring approximately 8.0 x 4.4 cm partially visualized at the medial left upper lobe (series 6, image 86). Lesion abuts the left main pulmonary artery. Finding concerning for a primary bronchogenic carcinoma. Review of the MIP images confirms the above findings CTA HEAD FINDINGS Anterior circulation: Petrous segments patent bilaterally. Scattered atheromatous change within the carotid siphons without hemodynamically significant stenosis. A1 segments patent bilaterally. Normal anterior communicating artery complex. Anterior cerebral arteries patent without stenosis. No M1 stenosis or occlusion. No proximal MCA branch occlusion. Distal MCA branches perfused and symmetric. Posterior circulation: Both vertebral arteries patent to the vertebrobasilar junction without stenosis. Both PICA patent. Basilar patent to its distal aspect without stenosis. Superior cerebral arteries patent bilaterally. Right PCA primarily supplied via the basilar. Fetal type origin of the left PCA. Both PCAs patent to their distal aspects without stenosis. Venous sinuses: Patent allowing for timing the contrast bolus. Anatomic variants: Fetal type left PCA. No intracranial aneurysm or other vascular malformation. Review of the MIP images confirms the above findings IMPRESSION: 1. Negative CTA for large vessel occlusion or other emergent finding. 2. Mild-to-moderate atheromatous disease, but no hemodynamically significant or correctable stenosis. 3. 8.0 x 4.4 cm left upper lobe mass, partially visualized, and concerning for a primary bronchogenic carcinoma. Further evaluation with dedicated cross-sectional imaging of the chest recommended. Given this finding, the previously identified hyperdense lesion at  the left parieto-occipital region is most concerning for a possible metastatic lesion. 4. Aortic Atherosclerosis (ICD10-I70.0) and Emphysema (ICD10-J43.9). These results were communicated to Dr. Cheral Marker at 10:08 pm on 03/28/2022 by text page via the [facility] messaging system. Electronically Signed   By: Jeannine Boga M.D.   On: 03/05/2022 22:55   CT HEAD CODE STROKE WO CONTRAST`  Result Date: 03/18/2022 CLINICAL DATA:  Code stroke. EXAM: CT HEAD WITHOUT CONTRAST TECHNIQUE: Contiguous axial images were obtained from the base of the skull through the vertex without intravenous contrast. RADIATION DOSE REDUCTION: This exam was performed according to the departmental dose-optimization program which includes automated exposure control, adjustment of the mA and/or kV according to patient size and/or use of iterative reconstruction technique. COMPARISON:  Prior CT from 03/27/2018. FINDINGS: Brain: Age-related cerebral atrophy with chronic small vessel ischemic disease. Chronic ischemic infarct at the anterior right frontal lobe, stable. 1.9 x 1.5 x 1.7 cm heterogeneous hyperdensity seen involving the parasagittal left parieto-occipital region (series 2, image 20). While this could reflect an intraparenchymal bleed, the heterogeneous appearance raises the concern for an underlying hemorrhagic mass. No significant surrounding edema. No other visible mass lesion. No other acute intracranial hemorrhage or large vessel territory infarct. No hydrocephalus or extra-axial fluid collection. Vascular: No hyperdense vessel. Scattered vascular calcifications noted within the carotid siphons. Skull: Scalp soft tissues and calvarium within normal limits. Sinuses/Orbits: Left gaze noted. Mild scattered mucosal thickening noted about the sphenoethmoidal sinuses. Paranasal sinuses are otherwise largely clear. No significant mastoid effusion. Other: None. ASPECTS [facility] Stroke Program Early CT Score) Acute bleed, does not apply.  IMPRESSION: 1. 1.9 x 1.5 x 1.7 cm heterogeneous hyperdensity at the parasagittal left parieto-occipital region. While this finding could reflect a simple hemorrhage, the heterogeneous appearance raises the concern for possible underlying lesion/mass. Further evaluation with dedicated brain MRI, with and without contrast recommended. No significant surrounding edema or mass effect. 2. Underlying atrophy with chronic small vessel ischemic disease with small remote right frontal infarct. These results were communicated to Dr. Cheral Marker at 9:57 pm on 03/31/2022 by text page via the [facility] messaging system. Electronically Signed   By: Jeannine Boga M.D.   On: 03/06/2022 22:00   DG Chest Portable 1 View  Result Date: 03/28/2022 CLINICAL DATA:  Altered mental status EXAM: PORTABLE CHEST 1 VIEW COMPARISON:  03/27/2018 FINDINGS: Postoperative changes in the mediastinum. Heart size and pulmonary vascularity are normal. Tortuous and calcified aorta. Emphysematous changes in the lungs. No airspace disease or consolidation. No pleural effusion. No pneumothorax. IMPRESSION: Emphysematous changes in the lungs.  No focal consolidation. Electronically Signed   By: Lucienne Capers M.D.   On: 03/26/2022 20:49   MR Lumbar Spine W Wo Contrast  Result Date: 03/26/2022 CLINICAL DATA:  Provided history: Herniated nucleus pulposis, lumbar. Additional history provided by scanning technologist: Patient reports low back pain radiating to right groin, down to right hip and right leg for 2 months, right leg weakness. EXAM: MRI LUMBAR SPINE WITHOUT AND WITH CONTRAST TECHNIQUE: Multiplanar and multiecho pulse sequences of the lumbar spine were obtained without and with intravenous contrast. CONTRAST:  9mL GADAVIST GADOBUTROL 1 MMOL/ML IV SOLN COMPARISON:  Radiographs of the lumbar spine 03/14/2017. FINDINGS: Segmentation: 5 lumbar vertebrae. The caudal most well-formed intervertebral disc space is designated L5-S1. Alignment: 2 mm  L3-L4 grade 1 retrolisthesis. 3 mm L5-S1 grade 1 retrolisthesis. Vertebrae: Large enhancing tumor centered within the posterior elements at the L2 and L3 levels, measuring 7.0 x 4.2 x 7.8 cm. Tumor extends slightly into the right posterolateral aspects of L2 and L3 vertebral bodies (for instance as seen on series 3, image 6). There is mild enhancement of the L1 spinous process, which may reflect subtle tumor extension to this site (series 8, image 10). There is abnormal epidural enhancement at L2 and L3, and tumor encroaches upon the spinal canal at these  levels, as described below. Associated extraosseous soft tissue tumor which extends posteriorly and to the right. On the right, extraosseous soft tissue tumor extends inferiorly to the level of the upper L4 vertebral body. 8 mm T1 and T2 hypointense and enhancing lesion within the ventral aspect of the L3 vertebral body. 7 mm T1 hypointense, T2 hyperintense and enhancing lesion within the left posterior aspect of the L3 vertebral body (for instance as seen on series 8, image 12). Edema and enhancement along the T12 superior endplate. Chronic L3 superior endplate vertebral compression fracture with mild height loss. Conus medullaris and cauda equina: Conus extends to the L1-L2 level. Paraspinal and other soft tissues: No abnormality identified within included portions of the abdomen/retroperitoneum. The above described tumor extends into the paraspinal soft tissues. Disc levels: Multilevel disc degeneration, greatest at L1-L2, L3-L4, L4-L5 and L5-S1 (moderate at these levels). T12-L1: Mild facet arthrosis. No significant disc herniation or stenosis. L1-L2: Posterior annular fissure. Disc bulge. Facet arthrosis and ligamentum flavum hypertrophy. No significant spinal canal stenosis or neural foraminal narrowing. At the L2 vertebral body level, tumor encroaches upon the spinal canal, resulting in markedly severe spinal canal stenosis with likely cauda equina nerve  root impingement. L2-L3: Disc bulge. Facet arthrosis. Tumor encroachment upon the spinal canal. Multifactorial markedly severe spinal canal stenosis with likely cauda equina nerve root impingement. Tumor mildly narrows the right neural foramen. At the L3 vertebral body level, tumor encroaches upon the spinal canal, resulting in moderate spinal canal stenosis. L3-L4: Slight grade 1 retrolisthesis. Posterior annular fissure. Disc bulge. Superimposed right foraminal disc protrusion at site of posterior annular fissure. Facet arthrosis and ligamentum flavum hypertrophy. No significant spinal canal stenosis. Tumor and the right foraminal disc protrusion contributes to mild right neural foraminal narrowing. Soft tissue tumor extension appears to encroach upon the exiting right L3 nerve root beyond the neural foramen (series 6, image 37) (series 3, image 4). L4-L5: Disc bulge with endplate spurring. Facet arthrosis and ligamentum flavum hypertrophy. No significant spinal canal stenosis. Mild-to-moderate bilateral neural foraminal narrowing. L5-S1: Grade 1 retrolisthesis. Disc bulge with endplate spurring. Facet arthrosis with ligamentum flavum hypertrophy. Mild left subarticular narrowing (without appreciable nerve root impingement). Moderate bilateral neural foraminal narrowing. Impression #1 will be called to the ordering clinician or representative by the Radiologist Assistant, and communication documented in the PACS or [employer]. IMPRESSION: Large enhancing tumor centered within the L2 and L3 posterior elements, as described. Tumor encroachment upon the spinal canal at the L2 vertebral body level and L2-L3 disc level contributes to markedly severe spinal canal stenosis with likely cauda equina nerve root impingement. Tumor encroachment upon the spinal canal at the L3 vertebral body level results in moderate spinal canal stenosis. There is extraosseous extension of tumor into the paraspinal soft tissues. On the  right, extraosseous soft tissue tumor encroaches upon the exiting right L3 nerve root beyond the neural foramen. This may reflect osseous metastatic disease or a primary bone tumor. Direct tissue sampling should be considered. Notably, there are also suspicious subcentimeter enhancing lesions within the L3 vertebral body. A focus of edema and enhancement along the T12 superior endplate may be degenerative or may reflect an additional osseous lesion. Chronic L3 superior endplate vertebral compression fracture with mild height loss. Lumbar spondylosis, as outlined. Electronically Signed   By: Kellie Simmering D.O.   On: 03/26/2022 20:39   ECHOCARDIOGRAM COMPLETE  Result Date: 03/15/2022    ECHOCARDIOGRAM REPORT   Patient Name:   Danny Krause Date  of Exam: 03/13/2022 Medical Rec #:  [id number]         Height:       65.0 in Accession #:    [id number]        Weight:       125.0 lb Date of Birth:  05-20-1941         BSA:          1.620 m Patient Age:    6 years          BP:           112/68 mmHg Patient Gender: M                 HR:           113 bpm. Exam Location:  [place] Procedure: 2D Echo, Cardiac Doppler and Color Doppler Indications:    Z95.2 S/p AVR  History:        Patient has prior history of Echocardiogram examinations, most                 recent 12/19/2017. CAD, Prior CABG, COPD, S/p AVR; Risk                 Factors:Hypertension, Dyslipidemia and Former Smoker.                 Aortic Valve: 23 mm bioprosthetic valve is present in the aortic                 position. Procedure Date: 07/04/2017.  Sonographer:    Coralyn Helling RDCS Referring Phys: [phone] Cherie Dark, JR DICK  Sonographer Comments: Technically challenging study due to limited acoustic windows and Technically difficult study due to poor echo windows. Image acquisition challenging due to patient body habitus and Image acquisition challenging due to COPD. IMPRESSIONS  1. Left ventricular ejection fraction, by estimation, is 65 to 70%. The  left ventricle has normal function. The left ventricle has no regional wall motion abnormalities. There is moderate concentric left ventricular hypertrophy. Left ventricular diastolic parameters were normal.  2. Right ventricular systolic function is normal. The right ventricular size is normal.  3. The mitral valve is grossly normal. No evidence of mitral valve regurgitation. No evidence of mitral stenosis.  4. The aortic valve has been repaired/replaced. Aortic valve regurgitation is not visualized. There is a 23 mm bioprosthetic valve present in the aortic position. Procedure Date: 07/04/2017. Aortic valve mean gradient measures 8.5 mmHg.  5. The inferior vena cava is normal in size with greater than 50% respiratory variability, suggesting right atrial pressure of 3 mmHg. Comparison(s): Changes from prior study are noted. Conclusion(s)/Recommendation(s): Normal to hyperdynamic LV without apparent regional wall motion abnormalities. Technically challenging due to COPD/lung fields. Patient declined definity. AVR not well see but stable by gradients/doppler evaluation. FINDINGS  Left Ventricle: Left ventricular ejection fraction, by estimation, is 65 to 70%. The left ventricle has normal function. The left ventricle has no regional wall motion abnormalities. The left ventricular internal cavity size was normal in size. There is  moderate concentric left ventricular hypertrophy. Left ventricular diastolic parameters were normal. Right Ventricle: The right ventricular size is normal. Right vetricular wall thickness was not well visualized. Right ventricular systolic function is normal. Left Atrium: Left atrial size was normal in size. Right Atrium: Right atrial size was not well visualized. Pericardium: There is no evidence of pericardial effusion. Mitral Valve: The mitral valve is grossly normal. No evidence of mitral valve regurgitation. No evidence  of mitral valve stenosis. Tricuspid Valve: The tricuspid valve is  not well visualized. Tricuspid valve regurgitation is trivial. No evidence of tricuspid stenosis. Aortic Valve: Not well visualized but no apparent regurgitation/stenosis. The aortic valve has been repaired/replaced. Aortic valve regurgitation is not visualized. Aortic valve mean gradient measures 8.5 mmHg. Aortic valve peak gradient measures 15.0 mmHg. Aortic valve area, by VTI measures 1.86 cm. There is a 23 mm bioprosthetic valve present in the aortic position. Procedure Date: 07/04/2017. Pulmonic Valve: The pulmonic valve was not well visualized. Pulmonic valve regurgitation is not visualized. Aorta: The aortic root, ascending aorta and aortic arch are all structurally normal, with no evidence of dilitation or obstruction. Venous: The inferior vena cava is normal in size with greater than 50% respiratory variability, suggesting right atrial pressure of 3 mmHg. IAS/Shunts: The interatrial septum was not well visualized.  LEFT VENTRICLE PLAX 2D LVIDd:         4.30 cm   Diastology LVIDs:         3.10 cm   LV e' medial:    18.30 cm/s LV PW:         1.40 cm   LV E/e' medial:  3.4 LV IVS:        1.50 cm   LV e' lateral:   16.80 cm/s LVOT diam:     1.90 cm   LV E/e' lateral: 3.7 LV SV:         54 LV SV Index:   33 LVOT Area:     2.84 cm  RIGHT VENTRICLE         IVC TAPSE (M-mode): 1.4 cm  IVC diam: 1.10 cm LEFT ATRIUM           Index        RIGHT ATRIUM           Index LA diam:      3.50 cm 2.16 cm/m   RA Pressure: 3.00 mmHg LA Vol (A4C): 19.8 ml 12.22 ml/m  RA Area:     13.40 cm                                    RA Volume:   33.20 ml  20.49 ml/m  AORTIC VALVE AV Area (Vmax):    1.48 cm AV Area (Vmean):   1.73 cm AV Area (VTI):     1.86 cm AV Vmax:           193.50 cm/s AV Vmean:          132.500 cm/s AV VTI:            0.290 m AV Peak Grad:      15.0 mmHg AV Mean Grad:      8.5 mmHg LVOT Vmax:         101.00 cm/s LVOT Vmean:        80.700 cm/s LVOT VTI:          0.190 m LVOT/AV VTI ratio: 0.66  AORTA Ao Root  diam: 3.50 cm Ao Asc diam:  3.50 cm MITRAL VALVE                TRICUSPID VALVE MV Area (PHT): 2.71 cm     Estimated RAP:  3.00 mmHg MV Decel Time: 280 msec MV E velocity: 62.90 cm/s   SHUNTS MV A velocity: 112.00 cm/s  Systemic VTI:  0.19 m MV E/A ratio:  0.56         Systemic Diam: 1.90 cm Buford Dresser MD Electronically signed by Buford Dresser MD Signature Date/Time: 03/15/2022/8:37:02 AM    Final     Microbiology: Recent Results (from the past 240 hour(s))  MRSA Next Gen by PCR, Nasal     Status: None   Collection Time: 03/31/22  2:05 AM   Specimen: Nasal Mucosa; Nasal Swab  Result Value Ref Range Status   MRSA by PCR Next Gen NOT DETECTED NOT DETECTED Final    Comment: (NOTE) The GeneXpert MRSA Assay (FDA approved for NASAL specimens only), is one component of a comprehensive MRSA colonization surveillance program. It is not intended to diagnose MRSA infection nor to guide or monitor treatment for MRSA infections. Test performance is not FDA approved in patients less than 86 years old. Performed at [facility], [place] [address], [place], [place] [address]     Time spent: 35 minutes  Signed: Estill Cotta, MD

## 2022-04-04 NOTE — Progress Notes (Signed)
Manufacturing engineer [facility]) Hospital Liaison Note  Referral received for patient/family interest in [facility]. Chart under review by [facility] physician.   Hospice eligibility pending.   Unfortunately, [facility] is unable to offer a bed today. [place] liaison will follow up tomorrow with family in regards to bed availability.   Please call with any questions or concerns. Thank you  Roselee Nova, [facility] Liaison [phone]

## 2022-04-04 NOTE — Progress Notes (Signed)
Wasted Morphine 65cc with Jolayne Panther, RN.

## 2022-04-04 NOTE — Plan of Care (Signed)

## 2022-04-04 NOTE — Progress Notes (Signed)
[facility]                                                                              Ericberto Padget, is a 81 y.o. 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  Over the last 2 months, had significant deterioration in his functional capacity, lost appetite and has lost weight.  Due to worsening back pain and overall deterioration, he was sent for MRI of his lumbar spine on 7/23 which had shown large tumor centered within L2-L3 with associated spinal canal stenosis. On admission, he was noted to be incoherent speech by family members.  Upon EMS arrival patient was actively seizing which abated with 5 mg of Versed, subsequently became minimally responsive.  CT head was concerning for intracranial hemorrhage and possibly mass.  He was admitted by critical care to ICU. 7/28: Patient was transitioned to full comfort care per family's request.  Patient was transferred to palliative floor 7/29: TRH assumed care   Assessment & Plan    Status epilepticus, history of seizure disorder -Witnessed seizures on admission -Received Keppra in ED, neurology was consulted. -CT head showed 1.9x 1.5x 1.7 cm heterogenous density at the parasagittal left parietal occipital o region, possibly hemorrhage versus mass.  Stat CTA head and neck was negative for large vessel occlusion or emergent findings. 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 (Active)  03/31/22 0200  Location: Buttocks  Location Orientation: Medial;Mid  Staging: Stage 2 -  Partial thickness loss of dermis presenting as a shallow open  injury with a red, pink wound bed without slough.  Wound Description (Comments):   Present on Admission: Yes  Dressing Type Foam - Lift dressing to assess site every shift 04-21-2022 0900   Code Status: DNR/DNI, comfort care DVT Prophylaxis:     Level of Care: Level of care: Med-Surg Family Communication: Updated patient's daughter at the bedside   Disposition Plan:      Remains inpatient appropriate:    Procedures:  EEG  Consultants:   Patient admitted by critical care Neurology     Medications  Chlorhexidine Gluconate Cloth  6 each Topical Daily   mouth rinse  15 mL Mouth Rinse 4 times per day   phenytoin (DILANTIN) IV  100  mg Intravenous Q8H      Subjective:   [name] was seen and examined today.  Minimally responsive, not following any commands  Objective:   Vitals:   03/31/22 1400 03/31/22 1500 03/31/22 1616 04/21/2022 0616  BP: (!) 97/58 (!) 90/54 96/60 (!) 91/46  Pulse: 72 68 69 84  Resp: 20 18 16  (!) 22  Temp:   97.7 F (36.5 C) 100 F (37.8 C)  TempSrc:   Oral Axillary  SpO2: 96% 98% 96% 96%  Weight:      Height:        Intake/Output Summary (Last 24 hours) at 2022/04/21 1208 Last data filed at 03/31/2022 1500 Gross per 24 hour  Intake 224.23 ml  Output 300 ml  Net -75.77 ml     Wt Readings from Last 3 Encounters:  04/03/2022 57 kg  03/08/22 56.7 kg  02/27/22 56.7 kg     Exam General: Minimally responsive, not following any verbal commands Cardiovascular: S1 S2 auscultated,  RRR Respiratory: Bilateral rhonchi Gastrointestinal: Soft, NBS Ext: no pedal edema bilaterally Neuro: not following any commands Psych: minimally responsive, not following any verbal commands    Data Reviewed:  I have personally reviewed following labs    CBC Lab Results  Component Value Date   WBC 17.0 (H) 03/31/2022   RBC 3.55 (L) 03/31/2022   HGB 10.4 (L) 03/31/2022   HCT 33.5 (L) 03/31/2022   MCV 94.4 03/31/2022   MCH 29.3 03/31/2022   PLT 372 03/31/2022   MCHC 31.0 03/31/2022   RDW 14.4 03/31/2022   LYMPHSABS 0.9 04/03/2022   MONOABS 1.1 (H) 03/27/2022   EOSABS 0.1 04/03/2022   BASOSABS 0.0 96/75/9163     Last metabolic panel Lab Results  Component Value Date   NA 135 03/31/2022   K 4.1 03/31/2022   CL 103 03/31/2022   CO2 24 03/31/2022   BUN 25 (H) 03/31/2022   CREATININE 0.90 03/31/2022   GLUCOSE 130 (H) 03/31/2022   GFRNONAA >60 03/31/2022   GFRAA 65 02/12/2020   CALCIUM 10.6 (H) 03/31/2022   PHOS 5.7 (H) 03/31/2022   PROT 6.3 (L) 03/26/2022   ALBUMIN 2.6 (L) 03/16/2022   LABGLOB 2.4 02/27/2022   AGRATIO 1.5 02/27/2022   BILITOT 0.6 03/04/2022   ALKPHOS 128  (H) 03/21/2022   AST 32 03/08/2022   ALT 22 03/19/2022   ANIONGAP 8 03/31/2022    CBG (last 3)  No results for input(s): "GLUCAP" in the last 72 hours.    Coagulation Profile: No results for input(s): "INR", "PROTIME" in the last 168 hours.   Radiology Studies: I have personally reviewed the imaging studies  Overnight EEG with video  Result Date: 03/31/2022   IMPRESSION: This study initially showed electrographic status epilepticus arising from left hemisphere,  maximal left posterior quadrant which resolved at around 0200 on 03/31/2022. After this, EEG showed evidence of epileptogenicity arising from left hemisphere, maximal left posterior quadrant which is on the ictal-interictal continuum with high likelihood of seizure recurrence. Additionally there is severe diffuse encephalopathy, most likely due to seizure.  Lora Havens    EEG adult  Result Date: 03/31/2022 IMPRESSION: This study showed evidence of electrographic status epilepticus arising from left hemisphere, maximal left posterior quadrant as well as severe diffuse encephalopathy likely related to seizures. Dr Cheral Marker was notified at 1201 am on 03/31/2022. Priyanka Barbra Sarks   CT ANGIO HEAD NECK W WO CM (CODE STROKE)  Result Date: 03/06/2022 IMPRESSION: 1. Negative CTA for large vessel occlusion or other emergent finding. 2. Mild-to-moderate atheromatous disease, but no hemodynamically significant or correctable stenosis. 3. 8.0 x 4.4 cm left upper lobe mass, partially visualized, and concerning for a primary bronchogenic carcinoma. Further evaluation with dedicated cross-sectional imaging of the chest recommended. Given this finding, the previously identified hyperdense lesion at the left parieto-occipital region is most concerning for a possible metastatic lesion. 4. Aortic Atherosclerosis (ICD10-I70.0) and Emphysema (ICD10-J43.9). These results were communicated to Dr. Cheral Marker at 10:08 pm on 04/02/2022 by text page via the [facility]  messaging system. Electronically Signed   By: Jeannine Boga M.D.   On: 03/28/2022 22:55   CT HEAD CODE STROKE WO CONTRAST`   IMPRESSION: 1. 1.9 x 1.5 x 1.7 cm heterogeneous hyperdensity at the parasagittal left parieto-occipital region. While this finding could reflect a simple hemorrhage, the heterogeneous appearance raises the concern for possible underlying lesion/mass. Further evaluation with dedicated brain MRI, with and without contrast recommended. No significant surrounding edema or mass effect. 2. Underlying atrophy with chronic small vessel ischemic disease with small remote right frontal infarct. These results were communicated to Dr. Cheral Marker at 9:57 pm on 03/28/2022 by text page via the [facility] messaging system. Electronically Signed   By: Jeannine Boga M.D.   On: 03/29/2022 22:00   DG Chest Portable 1 View  Result Date: 03/09/2022 CLINICAL DATA:  Altered mental status EXAM: PORTABLE CHEST 1 VIEW COMPARISON:  03/27/2018 FINDINGS: Postoperative changes in the mediastinum. Heart size and pulmonary vascularity are normal. Tortuous and calcified aorta. Emphysematous changes in the lungs. No airspace disease or consolidation. No pleural effusion. No pneumothorax. IMPRESSION: Emphysematous changes in the lungs.  No focal consolidation. Electronically Signed   By: Lucienne Capers M.D.   On: 03/25/2022 20:49       Mckensi Redinger M.D. [facility] 04/30/22, 12:08 PM  Available via Epic secure chat 7am-7pm After 7 pm, please refer to night coverage provider listed on amion.

## 2022-04-04 NOTE — TOC Progression Note (Signed)
Transition of Care [facility]) - Initial/Assessment Note    Patient Details  Name: Danny Krause MRN: [id number] Date of Birth: July 02, 1941  [facility] CM/SW Contact:    Milinda Antis, [place] Phone Number: April 14, 2022, 12:50 PM  Clinical Narrative:                 CSW received consult for inpatient hospice at [facility]  CSW made a referral to the hospital liaison.  There are no beds available at this time, but patient will be assessed for placement on the waiting list.  TOC will continue to follow.         Patient Goals and CMS Choice        Expected Discharge Plan and Services                                                Prior Living Arrangements/Services                       Activities of Daily Living Home Assistive Devices/Equipment: CPAP, Walker (specify type) (Front wheel) ADL Screening (condition at time of admission) Patient's cognitive ability adequate to safely complete daily activities?: No Is the patient deaf or have difficulty hearing?: No Does the patient have difficulty seeing, even when wearing glasses/contacts?: No Does the patient have difficulty concentrating, remembering, or making decisions?: No Patient able to express need for assistance with ADLs?: Yes Does the patient have difficulty dressing or bathing?: Yes Independently performs ADLs?: No Does the patient have difficulty walking or climbing stairs?: Yes Weakness of Legs: Both Weakness of Arms/Hands: Both  Permission Sought/Granted                  Emotional Assessment              Admission diagnosis:  Status epilepticus ([place]) [G40.901] Lung mass [R91.8] Hemorrhagic stroke [facility]) [I61.9] Patient Active Problem List   Diagnosis Date Noted   Pressure injury of skin 03/31/2022   Hemorrhagic stroke [facility])    Status epilepticus ([place]) 03/25/2022   Anxiety 11/09/2020   Healthcare maintenance 10/13/2019   OSA (obstructive sleep apnea)  07/08/2018   History of stroke 04/10/2018   Unresponsive 03/28/2018   Essential hypertension 03/27/2018   HLD (hyperlipidemia) 03/27/2018   Stroke (cerebrum) ([place]) 03/27/2018   GERD (gastroesophageal reflux disease) 03/27/2018   Depression 03/27/2018   Unresponsiveness 03/27/2018   Normocytic anemia 22/97/9892   Acute metabolic encephalopathy 11/94/1740   Encephalopathy chronic 02/13/2018   Tracheobronchitis    Endotracheally intubated    Lung nodule seen on imaging study 01/24/2018   Uncontrolled hypertension 12/26/2017   Acute respiratory failure with hypoxia and hypercarbia ([place]) 12/18/2017   Acute respiratory failure ([place]) 12/18/2017   COPD, severe ([place]) 09/21/2017   S/P CABG x 3 07/04/2017   S/P AVR (aortic valve replacement) 07/04/2017   Syncope 06/01/2017   Cardiac murmur 05/08/2017   Seizures ([place]) 03/13/2017   PCP:  Pcp, No Pharmacy:   [facility] [place], [address] [place] [address] Phone: [phone] Fax: [phone]     Social Determinants of Health (SDOH) Interventions    Readmission Risk Interventions     No data to display

## 2022-04-04 DEATH — deceased

## 2022-04-06 ENCOUNTER — Other Ambulatory Visit (HOSPITAL_COMMUNITY): Payer: PPO

## 2022-04-06 ENCOUNTER — Encounter (HOSPITAL_COMMUNITY): Payer: Self-pay

## 2022-04-13 ENCOUNTER — Ambulatory Visit: Payer: PPO | Admitting: Neurology

## 2022-05-11 ENCOUNTER — Encounter (HOSPITAL_COMMUNITY): Payer: Self-pay

## 2022-05-11 ENCOUNTER — Ambulatory Visit (HOSPITAL_COMMUNITY): Payer: PPO

## 2022-05-23 ENCOUNTER — Ambulatory Visit: Payer: PPO | Admitting: Neurology

## 2022-06-06 ENCOUNTER — Other Ambulatory Visit: Payer: Self-pay | Admitting: Internal Medicine
